# Patient Record
Sex: Male | Born: 1950 | Race: Black or African American | Hispanic: No | Smoking: Former smoker
Health system: Southern US, Community
[De-identification: ages and names within clinical notes are randomized; demographics above are authoritative.]

## PROBLEM LIST (undated history)

## (undated) DIAGNOSIS — N189 Chronic kidney disease, unspecified: Secondary | ICD-10-CM

## (undated) DIAGNOSIS — C801 Malignant (primary) neoplasm, unspecified: Secondary | ICD-10-CM

## (undated) DIAGNOSIS — M542 Cervicalgia: Secondary | ICD-10-CM

## (undated) DIAGNOSIS — R51 Headache: Secondary | ICD-10-CM

## (undated) DIAGNOSIS — Z940 Kidney transplant status: Secondary | ICD-10-CM

## (undated) DIAGNOSIS — K5900 Constipation, unspecified: Secondary | ICD-10-CM

## (undated) DIAGNOSIS — E119 Type 2 diabetes mellitus without complications: Secondary | ICD-10-CM

## (undated) DIAGNOSIS — R519 Headache, unspecified: Secondary | ICD-10-CM

## (undated) DIAGNOSIS — I10 Essential (primary) hypertension: Secondary | ICD-10-CM

## (undated) HISTORY — PX: PROSTATECTOMY: SHX69

## (undated) SURGERY — Surgical Case
Anesthesia: *Unknown

---

## 2003-07-30 ENCOUNTER — Encounter: Admission: RE | Admit: 2003-07-30 | Discharge: 2003-07-30 | Payer: Self-pay | Admitting: Family Medicine

## 2013-11-19 ENCOUNTER — Inpatient Hospital Stay (HOSPITAL_COMMUNITY): Payer: Medicaid Other

## 2013-11-19 ENCOUNTER — Inpatient Hospital Stay (HOSPITAL_COMMUNITY)
Admission: EM | Admit: 2013-11-19 | Discharge: 2013-11-26 | DRG: 674 | Disposition: A | Discharge status: Home / Self Care | Payer: Medicaid Other | Attending: Internal Medicine | Admitting: Internal Medicine

## 2013-11-19 ENCOUNTER — Encounter (HOSPITAL_COMMUNITY): Payer: Self-pay | Admitting: Emergency Medicine

## 2013-11-19 ENCOUNTER — Emergency Department (HOSPITAL_COMMUNITY): Payer: Medicaid Other

## 2013-11-19 DIAGNOSIS — D631 Anemia in chronic kidney disease: Secondary | ICD-10-CM | POA: Diagnosis present

## 2013-11-19 DIAGNOSIS — D649 Anemia, unspecified: Secondary | ICD-10-CM

## 2013-11-19 DIAGNOSIS — Z79899 Other long term (current) drug therapy: Secondary | ICD-10-CM | POA: Diagnosis not present

## 2013-11-19 DIAGNOSIS — D696 Thrombocytopenia, unspecified: Secondary | ICD-10-CM

## 2013-11-19 DIAGNOSIS — N186 End stage renal disease: Secondary | ICD-10-CM | POA: Diagnosis present

## 2013-11-19 DIAGNOSIS — N2581 Secondary hyperparathyroidism of renal origin: Secondary | ICD-10-CM | POA: Diagnosis present

## 2013-11-19 DIAGNOSIS — N19 Unspecified kidney failure: Secondary | ICD-10-CM

## 2013-11-19 DIAGNOSIS — M7989 Other specified soft tissue disorders: Secondary | ICD-10-CM | POA: Diagnosis present

## 2013-11-19 DIAGNOSIS — Z87891 Personal history of nicotine dependence: Secondary | ICD-10-CM | POA: Diagnosis not present

## 2013-11-19 DIAGNOSIS — N179 Acute kidney failure, unspecified: Secondary | ICD-10-CM

## 2013-11-19 DIAGNOSIS — E872 Acidosis, unspecified: Secondary | ICD-10-CM | POA: Diagnosis present

## 2013-11-19 DIAGNOSIS — I120 Hypertensive chronic kidney disease with stage 5 chronic kidney disease or end stage renal disease: Secondary | ICD-10-CM | POA: Diagnosis present

## 2013-11-19 DIAGNOSIS — K5900 Constipation, unspecified: Secondary | ICD-10-CM | POA: Diagnosis not present

## 2013-11-19 DIAGNOSIS — R197 Diarrhea, unspecified: Secondary | ICD-10-CM | POA: Diagnosis present

## 2013-11-19 DIAGNOSIS — N039 Chronic nephritic syndrome with unspecified morphologic changes: Secondary | ICD-10-CM

## 2013-11-19 DIAGNOSIS — N032 Chronic nephritic syndrome with diffuse membranous glomerulonephritis: Secondary | ICD-10-CM | POA: Diagnosis present

## 2013-11-19 DIAGNOSIS — I10 Essential (primary) hypertension: Secondary | ICD-10-CM

## 2013-11-19 HISTORY — DX: Essential (primary) hypertension: I10

## 2013-11-19 LAB — URINALYSIS, ROUTINE W REFLEX MICROSCOPIC
Bilirubin Urine: NEGATIVE
Glucose, UA: NEGATIVE mg/dL
Ketones, ur: NEGATIVE mg/dL
Leukocytes, UA: NEGATIVE
Nitrite: NEGATIVE
Protein, ur: 100 mg/dL — AB
Specific Gravity, Urine: 1.007 (ref 1.005–1.030)
Urobilinogen, UA: 0.2 mg/dL (ref 0.0–1.0)
pH: 6 (ref 5.0–8.0)

## 2013-11-19 LAB — HEPATIC FUNCTION PANEL
ALT: <5 U/L (ref 0–53)
AST: 14 U/L (ref 0–37)
Albumin: 3.5 g/dL (ref 3.5–5.2)
Alkaline Phosphatase: 67 U/L (ref 39–117)
Bilirubin, Direct: <0.2 mg/dL (ref 0.0–0.3)
Total Bilirubin: 0.3 mg/dL (ref 0.3–1.2)
Total Protein: 6.7 g/dL (ref 6.0–8.3)

## 2013-11-19 LAB — PROTEIN / CREATININE RATIO, URINE
Creatinine, Urine: 38.59 mg/dL
Protein Creatinine Ratio: 2.37 — ABNORMAL HIGH (ref 0.00–0.15)
Total Protein, Urine: 91.4 mg/dL

## 2013-11-19 LAB — CBC
HCT: 21.2 % — ABNORMAL LOW (ref 39.0–52.0)
Hemoglobin: 7.2 g/dL — ABNORMAL LOW (ref 13.0–17.0)
MCH: 30.4 pg (ref 26.0–34.0)
MCHC: 34 g/dL (ref 30.0–36.0)
MCV: 89.5 fL (ref 78.0–100.0)
Platelets: 172 10*3/uL (ref 150–400)
RBC: 2.37 MIL/uL — ABNORMAL LOW (ref 4.22–5.81)
RDW: 14.8 % (ref 11.5–15.5)
WBC: 5 10*3/uL (ref 4.0–10.5)

## 2013-11-19 LAB — BASIC METABOLIC PANEL
BUN: 100 mg/dL — ABNORMAL HIGH (ref 6–23)
CO2: 13 mEq/L — ABNORMAL LOW (ref 19–32)
Calcium: 5 mg/dL — CL (ref 8.4–10.5)
Chloride: 100 mEq/L (ref 96–112)
Creatinine, Ser: 9.83 mg/dL — ABNORMAL HIGH (ref 0.50–1.35)
GFR calc Af Amer: 6 mL/min — ABNORMAL LOW (ref >90)
GFR calc non Af Amer: 5 mL/min — ABNORMAL LOW (ref >90)
Glucose, Bld: 87 mg/dL (ref 70–99)
Potassium: 4.1 mEq/L (ref 3.7–5.3)
Sodium: 141 mEq/L (ref 137–147)

## 2013-11-19 LAB — MAGNESIUM: Magnesium: 1.5 mg/dL (ref 1.5–2.5)

## 2013-11-19 LAB — URINE MICROSCOPIC-ADD ON

## 2013-11-19 LAB — PHOSPHORUS: Phosphorus: 10.3 mg/dL — ABNORMAL HIGH (ref 2.3–4.6)

## 2013-11-19 LAB — SODIUM, URINE, RANDOM: Sodium, Ur: 100 mEq/L

## 2013-11-19 LAB — CREATININE, URINE, RANDOM: Creatinine, Urine: 38.59 mg/dL

## 2013-11-19 MED ORDER — SODIUM CHLORIDE 0.9 % IV SOLN: INTRAVENOUS | Status: DC

## 2013-11-19 MED ORDER — SODIUM CHLORIDE 0.9 % IV SOLN: Freq: Once | INTRAVENOUS | Status: DC

## 2013-11-19 MED ORDER — SODIUM CHLORIDE 0.9 % IV SOLN
1.0000 g | Freq: Once | INTRAVENOUS | Status: AC
  Administered 2013-11-19: 1 g via INTRAVENOUS
  Filled 2013-11-19: qty 10

## 2013-11-19 MED ORDER — PROMETHAZINE HCL 25 MG/ML IJ SOLN
12.5000 mg | INTRAMUSCULAR | Status: DC | PRN
  Administered 2013-11-22: 25 mg via INTRAVENOUS
  Filled 2013-11-19: qty 1

## 2013-11-19 MED ORDER — AMLODIPINE BESYLATE 10 MG PO TABS
10.0000 mg | ORAL_TABLET | Freq: Every day | ORAL | Status: DC
  Administered 2013-11-20 – 2013-11-26 (×5): 10 mg via ORAL
  Filled 2013-11-19 (×8): qty 1

## 2013-11-19 MED ORDER — ACETAMINOPHEN 500 MG PO TABS
1000.0000 mg | ORAL_TABLET | Freq: Four times a day (QID) | ORAL | Status: DC | PRN
  Administered 2013-11-19 – 2013-11-24 (×3): 1000 mg via ORAL
  Filled 2013-11-19 (×3): qty 2

## 2013-11-19 MED ORDER — SODIUM CHLORIDE 0.9 % IV SOLN
2.0000 g | Freq: Once | INTRAVENOUS | Status: AC
  Administered 2013-11-19: 2 g via INTRAVENOUS
  Filled 2013-11-19: qty 20

## 2013-11-19 MED ORDER — SODIUM BICARBONATE 650 MG PO TABS
1300.0000 mg | ORAL_TABLET | Freq: Three times a day (TID) | ORAL | Status: DC
  Administered 2013-11-19 – 2013-11-22 (×9): 1300 mg via ORAL
  Filled 2013-11-19 (×13): qty 2

## 2013-11-19 MED ORDER — CALCITRIOL 0.5 MCG PO CAPS
0.5000 ug | ORAL_CAPSULE | Freq: Every day | ORAL | Status: DC
  Administered 2013-11-19 – 2013-11-26 (×7): 0.5 ug via ORAL
  Filled 2013-11-19 (×8): qty 1

## 2013-11-19 MED ORDER — MAGNESIUM SULFATE 40 MG/ML IJ SOLN
2.0000 g | Freq: Once | INTRAMUSCULAR | Status: AC
  Administered 2013-11-19: 2 g via INTRAVENOUS
  Filled 2013-11-19: qty 50

## 2013-11-19 MED ORDER — HEPARIN SODIUM (PORCINE) 5000 UNIT/ML IJ SOLN
5000.0000 [IU] | Freq: Three times a day (TID) | INTRAMUSCULAR | Status: DC
  Administered 2013-11-19 – 2013-11-26 (×18): 5000 [IU] via SUBCUTANEOUS
  Filled 2013-11-19 (×23): qty 1

## 2013-11-19 MED ORDER — CALCIUM CARBONATE ANTACID 500 MG PO CHEW
800.0000 mg | CHEWABLE_TABLET | Freq: Three times a day (TID) | ORAL | Status: DC
  Administered 2013-11-20 – 2013-11-26 (×17): 800 mg via ORAL
  Filled 2013-11-19 (×22): qty 4

## 2013-11-19 MED ORDER — AMLODIPINE BESYLATE 5 MG PO TABS
5.0000 mg | ORAL_TABLET | Freq: Every day | ORAL | Status: DC
  Filled 2013-11-19: qty 1

## 2013-11-19 NOTE — H&P (Signed)
Date: 11/19/2013               Patient Name:  Andrew Brandt MRN: [id number]  DOB: 07/01/1951 Age / Sex: 63 y.o., male   PCP: No primary provider on file.         Medical Service: Internal Medicine Teaching Service         Attending Physician: Dr. Bartholomew Crews, MD    First Contact: Dr. Naaman Plummer Pager: [phone]  Second Contact: Dr. Aundra Dubin Pager: [phone]       After Hours (After 5p/  First Contact Pager: [phone]  weekends / holidays): Second Contact Pager: [phone]   Chief Complaint: Abnormal Lab results,and dialysis.  History of Present Illness: 63 year old male with PMH of HTN, who went to his girlfriends PCP Dr. Vista Lawman, for just a routine check yesterday. Lab results came back and he was told to go to the Ed has he had abnormal results and was told he needs dialysis. Pt doesnt have a PCP, has not seen a doctor in many years but was told in the past that he has HTN. Yesterday Amlodipine- Benazepril was prescribed for pt, which he just started taking. Pt had some nausea but denies vomiting, but says he has 2 episodes of loose stools over the two days- Non bloody. Normal appetite been eating and drinking. Denies use of NSAIDs. The only medications he takes is Tylenol. No difficulty passing urine or dysuric symptoms, no abdominal or flank pain. No family hx of kidney disease. Pt does say that he has had some leg swelling over the past 2 weeks, but no shortness of breath.  Meds: Current Facility-Administered Medications  Medication Dose Route Frequency Provider Last Rate Last Dose  . calcium gluconate 2 g in sodium chloride 0.9 % 100 mL IVPB  2 g Intravenous Once Cresenciano Genre, MD 120 mL/hr at 11/19/13 1429 2 g at 11/19/13 1429   Current Outpatient Prescriptions  Medication Sig Dispense Refill  . acetaminophen (TYLENOL) 500 MG tablet Take 1,000 mg by mouth every 6 (six) hours as needed for mild pain.      Marland Kitchen amLODipine-benazepril (LOTREL) 5-20 MG per capsule Take 1 capsule by  mouth daily.        Allergies: Allergies as of 11/19/2013  . (No Known Allergies)   Past Medical History  Diagnosis Date  . Hypertension    History reviewed. No pertinent past surgical history. No family history on file. History   Social History  . Marital Status: Single    Spouse Name: N/A    Number of Children: N/A  . Years of Education: N/A   Occupational History  . Not on file.   Social History Main Topics  . Smoking status: Former Research scientist (life sciences)  . Smokeless tobacco: Not on file  . Alcohol Use: No  . Drug Use: Not on file  . Sexual Activity: Not on file   Other Topics Concern  . Not on file   Social History Narrative  . No narrative on file    Review of Systems: CONSTITUTIONAL- No Fever, No weightloss or gain , night sweat, or change in appetite. SKIN- No Rash, colour changes, itching. HEAD- No  Headache,or dizziness. Mouth/throat- No Sorethroat, dentures, or bleeding gums. RESPIRATORY- No Cough, or SOB. CARDIAC- No Palpitations, or  chest pain. GI- No Dysphagia or abd pain. URINARY- No Frequency, or dysuria. NEUROLOGIC- No Numbness, syncope, or burning.   Physical Exam: Blood pressure 150/75, pulse 86, temperature 98.1 F (36.7 C),  temperature source Oral, resp. rate 17, SpO2 99.00%. GENERAL- Lying in bed comfortably, alert, co-operative, appears as stated age, not in any distress, but appears chronically ill.Marland Kitchen HEENT- Atraumatic, normocephalic, PERRL, EOMI, oral mucosa appears moist, no cervical LN enlargement, thyroid does not appear enlarged. CARDIAC- RRR, no murmurs, rubs or gallops. RESP- Moving equal volumes of air, no wheezes no crackles. ABDOMEN- Soft, nontender, no palpable masses or organomegaly, bowel sounds present. BACK- Normal curvature of the spine, No tenderness along the vertebrae, no CVA tenderness. NEURO- No obvious Cr N abnormality, strenght equal and present in all extremities, EXTREMITIES- Dorsalis pedis pulse 2+, symmetric, +1 Pitting  pedal edema to the lower third of his leg. SKIN- Warm, dry, No rash or lesion. PSYCH- Normal mood and affect, appropriate thought content and speech.  Lab results: Basic Metabolic Panel:  Recent Labs  11/19/13 1018  NA 141  K 4.1  CL 100  CO2 13*  GLUCOSE 87  BUN 100*  CREATININE 9.83*  CALCIUM 5.0*  MG 1.5   Liver Function Tests: No results found for this basename: AST, ALT, ALKPHOS, BILITOT, PROT, ALBUMIN,  in the last 72 hours No results found for this basename: LIPASE, AMYLASE,  in the last 72 hours No results found for this basename: AMMONIA,  in the last 72 hours CBC:  Recent Labs  11/19/13 1018  WBC 5.0  HGB 7.2*  HCT 21.2*  MCV 89.5  PLT 172    Urinalysis: No results found for this basename: COLORURINE, APPERANCEUR, LABSPEC, PHURINE, GLUCOSEU, HGBUR, BILIRUBINUR, KETONESUR, PROTEINUR, UROBILINOGEN, NITRITE, LEUKOCYTESUR,  in the last 72 hours  Imaging results:  Dg Chest Portable 1 View  11/19/2013   CLINICAL DATA:  History of hypertension, former smoker, need dialysis  EXAM: PORTABLE CHEST - 1 VIEW  COMPARISON:  None.  FINDINGS: The heart size and mediastinal contours are within normal limits. Both lungs are clear. The visualized skeletal structures are unremarkable.  IMPRESSION: No active disease.   Electronically Signed   By: Skipper Cliche M.D.   On: 11/19/2013 10:36    Other results: EKG: Rate- 89 bpm, regular, normal sinus rhythm, no ST or T wave abnormalities, normal PR and QRS interval, Normal axis, Qtc- 490.   Assessment & Plan by Problem: Active Problems:   AKI (acute kidney injury)  Acute Kidney Injury- Baseline kidney function is unknown. Pt most likley has underlying chronic kidney disease. No records to compare, as pt has not seen a doctor in many years. Cr on admission- 9.83, BUN- 100, Bicarb- 13, Calculated Anion Gap- 28.  Acute kidney injury- at this time no identifiable etiology- Denies NSAID use, no significant hx of fluid loss to suggest  prerenal etiology. No hx suggestive of Obstructive uropathy, and therefore post renal AKI. Pt has a hx of HTN- the most likley cause of his ?chronc kidney disease. Pts is  Hypocalcemic and hyperphosphatemic -  Calcium- 5.5, Phosphorus- 10.3, are suggestive of an underlying chronic kidney disease. Pt got a total of 3g of Calcium gluconate in the Ed and 4g of mag. - Admit to tele - Renal Uss - Urinalysis - Hepatic function Panel - SPEP, UPEP.  - Urine Na and osmolality - Nephrology consulted, Recs appreciated. - Urine Osmolality - HgbA1c - Magnesium - Renal Diet - Daily Weights - CBC and Bmet in the Am. - Stool for C. Diff. - GI pathogen panel   HTN- Blood pressure elevated in the Ed- 1166/83. New blood pressure meds started yesterday- Amlodipine- Benazepril- 5-20, 1 capsule  daily. - Amlodipine 5mg  daily, increase to 10mg  as indicated. - Avoid ACE inh.  DVT PPX- SCDs and heparin 5000u TID.  Code Status- Full.  Dispo: Disposition is deferred at this time, awaiting improvement of current medical problems.   The patient does not have a current PCP (No primary provider on file.) and does need an [facility] hospital follow-up appointment after discharge.  The patient does not have transportation limitations that hinder transportation to clinic appointments.  Signed: Jenetta Downer, MD 11/19/2013, 2:59 PM

## 2013-11-19 NOTE — ED Notes (Signed)
Communication with Phlebotomy for lab

## 2013-11-19 NOTE — ED Notes (Signed)
Critical Lab value communicated with MD

## 2013-11-19 NOTE — Discharge Planning (Signed)
[place] to patient about primary care resources and establishing care with a provider. Patient states he is uninsured and hasn't been seen by a primary care physician prior to yesterday in a few years. Patient was seen by Dr.Bonsu yesterday and was informed to come to the ED for further evaluation. States that he plans to continue care with Dr.Bonsu at [facility], and does not wish to change practices. Follow up appointment with PCP  Thursday May 7,2015 at 4:00, patient aware of this appointment. Medicaid application was given and explained, pt was instructed to take the completed application to the Department of Social Services. A resource guide and my contact information was also provided for any future questions or concerns. Patient expressed no other needs at this time.

## 2013-11-19 NOTE — ED Notes (Signed)
Called the floor to ask if bed was cleaned. Bed is not cleaned. Soil scientist

## 2013-11-19 NOTE — ED Notes (Signed)
Meal tray ordered for pt  

## 2013-11-19 NOTE — ED Notes (Signed)
Per Jonni Sanger RN on 6E Will call when bed is cleaned and in the room.

## 2013-11-19 NOTE — ED Notes (Signed)
63 yo male reports going to the Dr. Fatima Sanger office to have blood work. The nurse from the office called the pt. This morning stating that he needed to report to the ER for Dialysis. No known med hx on file.

## 2013-11-19 NOTE — ED Provider Notes (Signed)
CSN: [id number]     Arrival date & time 11/19/13  0930 History   First MD Initiated Contact with Patient 11/19/13 [phone]     Chief Complaint  Patient presents with  . Dialysis   . Electrolyte Imbalance      (Consider location/radiation/quality/duration/timing/severity/associated sxs/prior Treatment) HPI Comments: Patient reports he had about an hour of nausea last week. Otherwise he's been feeling his usual self. He reports that he saw Dr. Vista Lawman, his girlfriend primary care physician for just a routine check yesterday. He admits that he does not have a regular primary care physician, has not seen one in many years. He was told along time ago that he had hypertension but has not been on in medications for a long time. He is a former smoker, does not drink. Often, denies any drug use. He reports he's had 2-3 loose stools that he calls diarrhea over the last 2 days.  The history is provided by the patient.    Past Medical History  Diagnosis Date  . Hypertension    History reviewed. No pertinent past surgical history. No family history on file. History  Substance Use Topics  . Smoking status: Former Research scientist (life sciences)  . Smokeless tobacco: Not on file  . Alcohol Use: No    Review of Systems  Constitutional: Negative for fever, chills, appetite change, fatigue and unexpected weight change.  Cardiovascular: Negative for chest pain.  Gastrointestinal: Positive for nausea and diarrhea. Negative for vomiting and abdominal pain.  Neurological: Negative for dizziness and syncope.  All other systems reviewed and are negative.     Allergies  Review of patient's allergies indicates no known allergies.  Home Medications   Prior to Admission medications   Not on File   BP 134/58  Pulse 83  Temp(Src) 98.1 F (36.7 C) (Oral)  Resp 10  SpO2 100% Physical Exam  Nursing note and vitals reviewed. Constitutional: He is oriented to person, place, and time. He appears well-developed and  well-nourished. No distress.  HENT:  Head: Normocephalic and atraumatic.  Eyes: Conjunctivae are normal. No scleral icterus.  Neck: Normal range of motion. Neck supple.  Cardiovascular: Normal rate, regular rhythm and intact distal pulses.   Pulmonary/Chest: Effort normal. He has no wheezes. He has no rales.  Abdominal: Soft. He exhibits no distension. There is no tenderness.  Neurological: He is alert and oriented to person, place, and time. Coordination normal.  Skin: Skin is warm. No rash noted. He is not diaphoretic.    ED Course  Procedures (including critical care time) Labs Review Labs Reviewed  BASIC METABOLIC PANEL - Abnormal; Notable for the following:    CO2 13 (*)    BUN 100 (*)    Creatinine, Ser 9.83 (*)    Calcium 5.0 (*)    GFR calc non Af Amer 5 (*)    GFR calc Af Amer 6 (*)    All other components within normal limits  CBC - Abnormal; Notable for the following:    RBC 2.37 (*)    Hemoglobin 7.2 (*)    HCT 21.2 (*)    All other components within normal limits  MAGNESIUM    Imaging Review Dg Chest Portable 1 View  11/19/2013   CLINICAL DATA:  History of hypertension, former smoker, need dialysis  EXAM: PORTABLE CHEST - 1 VIEW  COMPARISON:  None.  FINDINGS: The heart size and mediastinal contours are within normal limits. Both lungs are clear. The visualized skeletal structures are unremarkable.  IMPRESSION: No  active disease.   Electronically Signed   By: Skipper Cliche M.D.   On: 11/19/2013 10:36     EKG Interpretation At time 09 56, sinus rhythm at a rate of 89, normal axis, borderline prolonged QT interval, nonspecific intraventricular conduction delay. No prior EKGs are available. Borderline EKG.     Room air saturation is 100% and I interpret this to be normal   1:20 PM Spoke to [facility] who will see pt and admit MDM   Final diagnoses:  Renal failure  Hypocalcemia  Anemia     Patient with prior history of hypertension, no regular preventative  care otherwise. Patient sounds like he was sent here due to concerns related to needing dialysis. Will recheck his electrolytes, obtain chest x-ray and EKG. My concern would be for renal failure and hyperkalemia. Plan on keeping the patient on monitor. His EKG suggests some borderline QT prolongation. Patient may require admission given presumed abnormal labs that were drawn yesterday at a primary care office.    Saddie Benders. Tyce Delcid, MD 11/19/13 1320

## 2013-11-19 NOTE — Consult Note (Signed)
Andrew Brandt 11/19/2013 Rexene Agent Requesting Physician:  Lynnae January, MD  Reason for Consult:  Azotemia, Renal Failure, Hypocalcemia HPI:  57M who for years has not had regular medical care saw his girlfriend's PMD yesterday for some N/V and loose stools.  Labs returned with severe renal failure, hypocalcemia as below.  Pt does not take NSAIDs, no recent ABX or infections of skin, sore throat, no ACEi. No OTC remedies.  No unusual rashes/joint pains.  No hemoptysis, sores in mouth / nose, hematuria, dysuria, frothy urine.  He has some pedal edema but no further swelling.    Pt w/ some intermittent poor PO over past 7d.  No itching, hiccups, dysgeusia, CP, SOB.  He thinks his girlfriend things he has been 'confused'.  NO problems with amount of urine or urine function.    In 2005 had renal US with b/l kidneys < 9cm.  He does carry a hx/o HTN.  He denies cocaine or other recreational drug use.    Creatinine, Ser (mg/dL)  Date Value  11/19/2013 9.83*  ] I/Os: I/O last 3 completed shifts: In: 100 [I.V.:100] Out: 500 [Urine:500]   ROS Balance of 12 systems is negative w/ exceptions as above  PMH  Past Medical History  Diagnosis Date  . Hypertension    PSH History reviewed. No pertinent past surgical history. FH  Family History  Problem Relation Age of Onset  . Cancer Mother   . Diabetes      grandparents   [name]  reports that he has quit smoking. He does not have any smokeless tobacco history on file. He reports that he does not drink alcohol. His drug history is not on file. Allergies No Known Allergies Home medications Prior to Admission medications   Medication Sig Start Date End Date Taking? Authorizing Provider  acetaminophen (TYLENOL) 500 MG tablet Take 1,000 mg by mouth every 6 (six) hours as needed for mild pain.   Yes Historical Provider, MD  amLODipine-benazepril (LOTREL) 5-20 MG per capsule Take 1 capsule by mouth daily.   Yes Historical Provider, MD     Current Medications Current Facility-Administered Medications  Medication Dose Route Frequency Provider Last Rate Last Dose  . 0.9 %  sodium chloride infusion   Intravenous Once Saddie Benders. Ghim, MD      . 0.9 %  sodium chloride infusion   Intravenous Continuous Cresenciano Genre, MD      . acetaminophen (TYLENOL) tablet 1,000 mg  1,000 mg Oral Q6H PRN Cresenciano Genre, MD      . amLODipine (NORVASC) tablet 10 mg  10 mg Oral Daily Jessee Avers, MD      . heparin injection 5,000 Units  5,000 Units Subcutaneous 3 times per day Cresenciano Genre, MD      . promethazine (PHENERGAN) injection 12.5-25 mg  12.5-25 mg Intravenous Q4H PRN Cresenciano Genre, MD        CBC  Recent Labs Lab 11/19/13 1018  WBC 5.0  HGB 7.2*  HCT 21.2*  MCV 89.5  PLT [id number]   Basic Metabolic Panel  Recent Labs Lab 11/19/13 1018 11/19/13 1510  NA 141  --   K 4.1  --   CL 100  --   CO2 13*  --   GLUCOSE 87  --   BUN 100*  --   CREATININE 9.83*  --   CALCIUM 5.0*  --   PHOS  --  10.3*    Physical Exam  Blood pressure 183/87, pulse  88, temperature 98.7 F (37.1 C), temperature source Oral, resp. rate 15, weight 68.1 kg (150 lb 2.1 oz), SpO2 100.00%. GEN: NAD, appears well ENT: NCAT,  Poor dentition EYES: EOMI CV: RRR.  No rub. Nl s1s2 PULM: CTAB.  Nl WOB ABD: s/nt/nd nabs.  No bruits SKIN: no rashes/lesions [id number] pedal edema b/l NEURO: no asterixis, aaox3 MSK: no twitching noted   Assessment 68M presenting with renal failure, normocytic anemia, hyperphosphatemia, hypocalcemia and a history of small kidneys from 2005 with hx/o HTN.  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    1. Renal Failure, favor CKD over AKI and likely ESRD 2. Normocytic Anemia 3. Hyperphosphatemia 4. Hypocalcemia, ASx largely 5. HTN 6. Acidosis, metabolic, inc  AG likely 2/2 renal failure  PLAN 1. Agree with gently hydrating overnight but doubt pt is hypovolemic 2. Renal US to verify no obstruction, obtain new kidney sizes 3. If not improved in AM need to c/s VVS for [facility] + permanent Access and move towards HD 4. Check iron indices, B12.  If replete will need ESA 5. SPEP, sFLC, HIV, HCV, HBV, C3, C4 6. UA and UPC 7. Agree with PTH 8. Add Tums TID 9. Begin calcitriol 0.69mcg daily 10. Agree with LFTs/albumin 11. Hold ACEi, cont CCB. 12. Start NaHCO3 1300 TID 13. NPO p MN for possible catheter 14. Likely pt will need RRT in next 24h 15. No NSAIDS  Pearson Grippe MD 11/19/2013, 8:08 PM

## 2013-11-19 NOTE — ED Notes (Signed)
Reports previously being nauseous but not currently. Diet and appetitie normal.

## 2013-11-19 NOTE — ED Notes (Signed)
MD at bedside. 

## 2013-11-19 NOTE — ED Notes (Signed)
Communication received from [facility]. Regarding bed assignment

## 2013-11-20 ENCOUNTER — Inpatient Hospital Stay (HOSPITAL_COMMUNITY): Payer: Medicaid Other

## 2013-11-20 ENCOUNTER — Encounter (HOSPITAL_COMMUNITY): Payer: Self-pay

## 2013-11-20 DIAGNOSIS — N185 Chronic kidney disease, stage 5: Secondary | ICD-10-CM

## 2013-11-20 DIAGNOSIS — E872 Acidosis, unspecified: Secondary | ICD-10-CM

## 2013-11-20 DIAGNOSIS — D649 Anemia, unspecified: Secondary | ICD-10-CM | POA: Diagnosis present

## 2013-11-20 DIAGNOSIS — N186 End stage renal disease: Secondary | ICD-10-CM | POA: Diagnosis present

## 2013-11-20 DIAGNOSIS — R197 Diarrhea, unspecified: Secondary | ICD-10-CM

## 2013-11-20 LAB — HIV ANTIBODY (ROUTINE TESTING W REFLEX): HIV 1&2 Ab, 4th Generation: NONREACTIVE

## 2013-11-20 LAB — CBC WITH DIFFERENTIAL/PLATELET
Basophils Absolute: 0 10*3/uL (ref 0.0–0.1)
Basophils Relative: 0 % (ref 0–1)
Eosinophils Absolute: 0.1 10*3/uL (ref 0.0–0.7)
Eosinophils Relative: 3 % (ref 0–5)
HCT: 18.1 % — ABNORMAL LOW (ref 39.0–52.0)
Hemoglobin: 6.1 g/dL — CL (ref 13.0–17.0)
Lymphocytes Relative: 34 % (ref 12–46)
Lymphs Abs: 1.1 10*3/uL (ref 0.7–4.0)
MCH: 29.6 pg (ref 26.0–34.0)
MCHC: 33.7 g/dL (ref 30.0–36.0)
MCV: 87.9 fL (ref 78.0–100.0)
Monocytes Absolute: 0.3 10*3/uL (ref 0.1–1.0)
Monocytes Relative: 9 % (ref 3–12)
Neutro Abs: 1.6 10*3/uL — ABNORMAL LOW (ref 1.7–7.7)
Neutrophils Relative %: 54 % (ref 43–77)
Platelets: 151 10*3/uL (ref 150–400)
RBC: 2.06 MIL/uL — ABNORMAL LOW (ref 4.22–5.81)
RDW: 14.5 % (ref 11.5–15.5)
WBC: 3.1 10*3/uL — ABNORMAL LOW (ref 4.0–10.5)

## 2013-11-20 LAB — FERRITIN: Ferritin: 246 ng/mL (ref 22–322)

## 2013-11-20 LAB — ABO/RH: ABO/RH(D): O POS

## 2013-11-20 LAB — IRON AND TIBC
Iron: 53 ug/dL (ref 42–135)
Saturation Ratios: 31 % (ref 20–55)
TIBC: 172 ug/dL — ABNORMAL LOW (ref 215–435)
UIBC: 119 ug/dL — ABNORMAL LOW (ref 125–400)

## 2013-11-20 LAB — CBC
HCT: 27.5 % — ABNORMAL LOW (ref 39.0–52.0)
Hemoglobin: 9.4 g/dL — ABNORMAL LOW (ref 13.0–17.0)
MCH: 29.7 pg (ref 26.0–34.0)
MCHC: 34.2 g/dL (ref 30.0–36.0)
MCV: 86.8 fL (ref 78.0–100.0)
Platelets: 123 10*3/uL — ABNORMAL LOW (ref 150–400)
RBC: 3.17 MIL/uL — ABNORMAL LOW (ref 4.22–5.81)
RDW: 14.4 % (ref 11.5–15.5)
WBC: 5.7 10*3/uL (ref 4.0–10.5)

## 2013-11-20 LAB — CALCIUM, IONIZED: Calcium, Ion: 0.61 mmol/L — CL (ref 1.13–1.30)

## 2013-11-20 LAB — OSMOLALITY, URINE: Osmolality, Ur: 291 mOsm/kg — ABNORMAL LOW (ref 390–1090)

## 2013-11-20 LAB — HEMOGLOBIN A1C
Hgb A1c MFr Bld: 5.2 % (ref <5.7)
Mean Plasma Glucose: 103 mg/dL (ref <117)

## 2013-11-20 LAB — BASIC METABOLIC PANEL
BUN: 100 mg/dL — ABNORMAL HIGH (ref 6–23)
CO2: 15 mEq/L — ABNORMAL LOW (ref 19–32)
Calcium: 5.1 mg/dL — CL (ref 8.4–10.5)
Chloride: 104 mEq/L (ref 96–112)
Creatinine, Ser: 9.78 mg/dL — ABNORMAL HIGH (ref 0.50–1.35)
GFR calc Af Amer: 6 mL/min — ABNORMAL LOW (ref >90)
GFR calc non Af Amer: 5 mL/min — ABNORMAL LOW (ref >90)
Glucose, Bld: 85 mg/dL (ref 70–99)
Potassium: 4.5 mEq/L (ref 3.7–5.3)
Sodium: 141 mEq/L (ref 137–147)

## 2013-11-20 LAB — PTH, INTACT AND CALCIUM
Calcium, Total (PTH): 5.8 mg/dL — CL (ref 8.4–10.5)
PTH: 925.4 pg/mL — ABNORMAL HIGH (ref 14.0–72.0)

## 2013-11-20 LAB — PREPARE RBC (CROSSMATCH)

## 2013-11-20 LAB — RETICULOCYTES
RBC.: 2.06 MIL/uL — ABNORMAL LOW (ref 4.22–5.81)
Retic Count, Absolute: 14.4 10*3/uL — ABNORMAL LOW (ref 19.0–186.0)
Retic Ct Pct: 0.7 % (ref 0.4–3.1)

## 2013-11-20 LAB — HEPATITIS B SURFACE ANTIGEN: Hepatitis B Surface Ag: NEGATIVE

## 2013-11-20 LAB — HEPATITIS B CORE ANTIBODY, TOTAL: Hep B Core Total Ab: NONREACTIVE

## 2013-11-20 LAB — CALCIUM, URINE, RANDOM: Calcium, Ur: 2 mg/dL

## 2013-11-20 LAB — VITAMIN D 25 HYDROXY (VIT D DEFICIENCY, FRACTURES): Vit D, 25-Hydroxy: 16 ng/mL — ABNORMAL LOW (ref 30–89)

## 2013-11-20 LAB — VITAMIN B12: Vitamin B-12: 332 pg/mL (ref 211–911)

## 2013-11-20 LAB — MAGNESIUM: Magnesium: 1.8 mg/dL (ref 1.5–2.5)

## 2013-11-20 LAB — HEPATITIS C ANTIBODY: HCV Ab: NEGATIVE

## 2013-11-20 LAB — HEPATITIS B SURFACE ANTIBODY,QUALITATIVE: Hep B S Ab: NEGATIVE

## 2013-11-20 MED ORDER — DEXTROSE 5 % IV SOLN: 1.5000 g | INTRAVENOUS | Status: DC

## 2013-11-20 NOTE — Procedures (Signed)
Placement of right IJ dialysis catheter   Emergent dialysis treatment  Lidocaine local anesthetic Localization of right IJ with ultrasound  Seldinger technique employed Flashback venous blood 3-0 nylon sutures placed x2 CXR ordered to confirm placement  Sherril Croon 11/20/2013, 11:12 AM

## 2013-11-20 NOTE — Procedures (Signed)
I have seen and examined this patient and agree with the plan of care. New start to dialysis . Tolerated placement of 15cm Right IJ dialysis catheter. Sherril Croon 11/20/2013, 11:11 AM

## 2013-11-20 NOTE — Progress Notes (Addendum)
CRITICAL VALUE ALERT  Critical value received: ionized Calcium 0.61.   Date of notification:  11/20/13  Time of notification:  205  Critical value read back:yes  Nurse who received alert:  Velora Mediate  MD notified (1st page):  Internal medicine

## 2013-11-20 NOTE — Progress Notes (Signed)
Utilization review completed.  

## 2013-11-20 NOTE — H&P (Signed)
  Date: 11/20/2013  Patient name: Andrew Brandt  Medical record number: [id number]  Date of birth: [birth year]   I have seen and evaluated Rollen Sox and discussed their care with the Residency Team. Mr Coral Spikes was admitted with renal failure, presumed chronic. He was hydrated overnight even though he was not sig vol contracted. His renal U/S R/O obstructive pathology. He is on no NSAID's, ACEI, or other nephrotoxic drugs. He is in HD now. Permanent HD access is being planned.   He has no complaints today. His sig other is in the room. BC he took anti-diarrheal, he has had no further loose stools. He denies increased recent fatigue.   HRRR. LCATB no crackles.   Assessment and Plan: I have seen and evaluated the patient as outlined above. I agree with the formulated Assessment and Plan as detailed in the residents' admission note, with the following changes:   1. Renal failure, presumed chronic - nephrology has started HD and arranging for permanent access. Bicarb has been started for the acidosis. W/U for other causes has been started. Electrolytes are being followed and repleted.   2. HTN - follow on Norvasc.   3. Anemia - to get one unit with HD today as HgB 6.1. % sat OK at 31. Anemia likely 2/2 chronic disease / CRF.  Bartholomew Crews, MD 5/1/20152:39 PM

## 2013-11-20 NOTE — Consult Note (Addendum)
[facility] [place] [facility] HISTORY AND PHYSICAL   History of Present Illness:  Patient is a 63 y.o. year old male who presents for placement of a permanent hemodialysis access. The patient is right handed.  The patient is currently on hemodialysis via a right neck temporary catheter.  The cause of renal failure is thought to be secondary to multifactorial but probably hypertension    Past Medical History  Diagnosis Date  . Hypertension     History reviewed. No pertinent past surgical history.   Social History History  Substance Use Topics  . Smoking status: Former Research scientist (life sciences)  . Smokeless tobacco: Not on file  . Alcohol Use: No    Family History Family History  Problem Relation Age of Onset  . Cancer Mother   . Diabetes      grandparents    Allergies  No Known Allergies   Current Facility-Administered Medications  Medication Dose Route Frequency Provider Last Rate Last Dose  . acetaminophen (TYLENOL) tablet 1,000 mg  1,000 mg Oral Q6H PRN Cresenciano Genre, MD   1,000 mg at 11/19/13 2138  . amLODipine (NORVASC) tablet 10 mg  10 mg Oral Daily Jessee Avers, MD      . calcitRIOL (ROCALTROL) capsule 0.5 mcg  0.5 mcg Oral Daily Rexene Agent, MD   0.5 mcg at 11/19/13 2138  . calcium carbonate (TUMS - dosed in mg elemental calcium) chewable tablet 800 mg of elemental calcium  800 mg of elemental calcium Oral TID WC Rexene Agent, MD   800 mg of elemental calcium at 11/20/13 0820  . heparin injection 5,000 Units  5,000 Units Subcutaneous 3 times per day Cresenciano Genre, MD   5,000 Units at 11/20/13 0531  . promethazine (PHENERGAN) injection 12.5-25 mg  12.5-25 mg Intravenous Q4H PRN Cresenciano Genre, MD      . sodium bicarbonate tablet 1,300 mg  1,300 mg Oral TID Rexene Agent, MD   1,300 mg at 11/19/13 2138    ROS:   General:  No weight loss, Fever, chills  HEENT: No recent headaches, no nasal bleeding, no visual changes, no sore throat  Neurologic: No  dizziness, blackouts, seizures. No recent symptoms of stroke or mini- stroke. No recent episodes of slurred speech, or temporary blindness.  Cardiac: No recent episodes of chest pain/pressure, no shortness of breath at rest.  No shortness of breath with exertion.  Denies history of atrial fibrillation or irregular heartbeat  Vascular: No history of rest pain in feet.  No history of claudication.  No history of non-healing ulcer, No history of DVT   Pulmonary: No home oxygen, no productive cough, no hemoptysis,  No asthma or wheezing  Musculoskeletal:  [ ]  Arthritis, [ ]  Low back pain,  [ ]  Joint pain  Hematologic:No history of hypercoagulable state.  No history of easy bleeding.  No history of anemia  Gastrointestinal: No hematochezia or melena,  No gastroesophageal reflux, no trouble swallowing  Urinary: [x ] chronic Kidney disease, [x ] on HD - [ ]  MWF or [ ]  TTHS, [ ]  Burning with urination, [ ]  Frequent urination, [ ]  Difficulty urinating;   Skin: No rashes  Psychological: No history of anxiety,  No history of depression   Physical Examination  Filed Vitals:   11/19/13 2221 11/20/13 0610 11/20/13 1118 11/20/13 1128  BP: 159/85 169/82 186/105 143/78  Pulse: 80 86 95 79  Temp: 98.8 F (37.1 C) 98.6 F (37 C) 99.1 F (37.3  C)   TempSrc: Oral Oral Oral   Resp: 16 17 11 12   Weight: 157 lb 6.5 oz (71.4 kg)  153 lb 7 oz (69.6 kg)   SpO2: 100% 99% 98%     There is no height on file to calculate BMI.  General:  Alert and oriented, no acute distress HEENT: Normal Neck: right neck temporary catheter Pulmonary: Clear to auscultation bilaterally Cardiac: Regular Rate and Rhythm  Gastrointestinal: Soft, non-tender, non-distended, no mass Skin: No rash Extremity Pulses:  2+ radial, brachial pulses bilaterally, visible left cephalic forearm vein Musculoskeletal: No deformity or edema  Neurologic: Upper and lower extremity motor 5/5 and symmetric  DATA: vein map pending  BMET     Component Value Date/Time   NA 141 11/20/2013 0755   K 4.5 11/20/2013 0755   CL 104 11/20/2013 0755   CO2 15* 11/20/2013 0755   GLUCOSE 85 11/20/2013 0755   BUN 100* 11/20/2013 0755   CREATININE 9.78* 11/20/2013 0755   CALCIUM 5.1* 11/20/2013 0755   GFRNONAA 5* 11/20/2013 0755   GFRAA 6* 11/20/2013 0755    CBC    Component Value Date/Time   WBC 3.1* 11/20/2013 0755   RBC 2.06* 11/20/2013 0755   RBC 2.06* 11/20/2013 0755   HGB 6.1* 11/20/2013 0755   HCT 18.1* 11/20/2013 0755   PLT 151 11/20/2013 0755   MCV 87.9 11/20/2013 0755   MCH 29.6 11/20/2013 0755   MCHC 33.7 11/20/2013 0755   RDW 14.5 11/20/2013 0755   LYMPHSABS 1.1 11/20/2013 0755   MONOABS 0.3 11/20/2013 0755   EOSABS 0.1 11/20/2013 0755   BASOSABS 0.0 11/20/2013 0755       ASSESSMENT: End stage renal disease needs permanent access.  Profound anemia will leave transfusion indications to primary service.   PLAN:  Vein mapping.  Diatek and left arm access on Monday.  [name], MD [facility] [place] Office: [phone] Pager: [phone]

## 2013-11-20 NOTE — Care Management Note (Signed)
CARE MANAGEMENT NOTE 11/20/2013  Patient:  Andrew Brandt, Andrew Brandt   Account Number:  [id number]  Date Initiated:  11/20/2013  Documentation initiated by:  Lirio Bach  Subjective/Objective Assessment:   Referral to assist pt with Medicaid/Medicare application.     Action/Plan:   CM following for progression and treatment determination if pt is determined to be Chronic rneal failure and requires outpatient HD, then hospital will assist with Medicaid application and possiblly disability.   Anticipated DC Date:     Anticipated DC Plan:  HOME/SELF CARE         Choice offered to / List presented to:             Status of service:   Medicare Important Message given?   (If response is "NO", the following Medicare IM given date fields will be blank) Date Medicare IM given:   Date Additional Medicare IM given:    Discharge Disposition:    Per UR Regulation:    If discussed at Long Length of Stay Meetings, dates discussed:    Comments:  11/20/2013 CM following for determination of chronic disease, at time of admission no chronic or long term disease that would qualify for Medicaid has been established. Please be aware that pt will have to be deemed to have chronic renal failure and require outpatient hemodialysis to qualify for Medicaid. Pt may at that time also apply for disability , willl clarify with finance. Jasmine Pang RN MPH, case manager, [phone]

## 2013-11-20 NOTE — Progress Notes (Addendum)
Subjective:  No acute events overnight. Pt with low calcium and Hg this AM.  Pt reports feeling well.  He reports his loose stools has improved. No fever, chills, abdominal pain, nausea, vomiting, or change in urination. No lighteadedness or pre-syncope.      Objective: Vital signs in last 24 hours: Filed Vitals:   11/20/13 1344 11/20/13 1345 11/20/13 1400 11/20/13 1410  BP: 122/83 122/83 149/88 149/85  Pulse: 81 81 79 85  Temp: 97.7 F (36.5 C) 97.7 F (36.5 C) 97.5 F (36.4 C) 97.1 F (36.2 C)  TempSrc: Oral Oral Oral Oral  Resp: 12 12 10 11   Weight:      SpO2:  98%     Weight change:   Intake/Output Summary (Last 24 hours) at 11/20/13 1641 Last data filed at 11/20/13 1500  Gross per 24 hour  Intake   1010 ml  Output   1500 ml  Net   -490 ml   PHYSICAL EXAM:  General - Thin appearing. NAD Cardiac - Normal rate and rhythm  Respiratory - Clear to auscultation bilaterally.   Abdomen  -  Soft, nontender, normal bowel sounds   NEURO- No obvious Cr N abnormality, strenght equal and present in all extremities,  Extremities - + 1 LE pitting edema   Skin - Warm, dry, no rash    Lab Results: Basic Metabolic Panel:  Recent Labs Lab 11/19/13 1018 11/19/13 1510 11/20/13 0755  NA 141  --  141  K 4.1  --  4.5  CL 100  --  104  CO2 13*  --  15*  GLUCOSE 87  --  85  BUN 100*  --  100*  CREATININE 9.83*  --  9.78*  CALCIUM 5.0*  --  5.1*  MG 1.5  --  1.8  PHOS  --  10.3*  --    Liver Function Tests:  Recent Labs Lab 11/19/13 2302  AST 14  ALT <5  ALKPHOS 67  BILITOT 0.3  PROT 6.7  ALBUMIN 3.5   CBC:  Recent Labs Lab 11/19/13 1018 11/20/13 0755 11/20/13 1605  WBC 5.0 3.1* 5.7  NEUTROABS  --  1.6*  --   HGB 7.2* 6.1* 9.4*  HCT 21.2* 18.1* 27.5*  MCV 89.5 87.9 86.8  PLT 172 151 123*   Hemoglobin A1C:  Recent Labs Lab 11/19/13 2302  HGBA1C 5.2    Anemia Panel:  Recent Labs Lab 11/20/13 0755  VITAMINB12 332  FERRITIN 246  TIBC 172*    IRON 53  RETICCTPCT 0.7   Urinalysis:  Recent Labs Lab 11/19/13 2132  COLORURINE YELLOW  LABSPEC 1.007  PHURINE 6.0  GLUCOSEU NEGATIVE  HGBUR LARGE*  BILIRUBINUR NEGATIVE  KETONESUR NEGATIVE  PROTEINUR 100*  UROBILINOGEN 0.2  NITRITE NEGATIVE  LEUKOCYTESUR NEGATIVE    Studies/Results: Dg Chest 1 View  11/20/2013   CLINICAL DATA:  Catheter placement.  EXAM: CHEST - 1 VIEW  COMPARISON:  11/20/2013, 1120 hr  FINDINGS: There is no evidence of right-sided pneumothorax with previous findings consistent with skin fold. The right jugular temporary dialysis catheter shows stable positioning with the catheter tip in the lower SVC. No edema, consolidation or pleural fluid is identified. The heart size and mediastinal contours are within normal limits.  IMPRESSION: No pneumothorax. Temporary dialysis catheter shows stable positioning in the lower SVC.   Electronically Signed   By: Aletta Edouard M.D.   On: 11/20/2013 15:13   US Renal  11/19/2013   CLINICAL DATA:  Abnormal  renal function studies in the setting of hypertension with history of questionable left renal mass  EXAM: RENAL/URINARY TRACT ULTRASOUND COMPLETE  COMPARISON:  None.  FINDINGS: Visualization of the kidneys is limited due to the patient's body habitus.  Right Kidney:  Length: 7.0 cm. The echotexture of the renal cortex is increased. There are multiple hypoechoic to anechoic structures which likely reflect cysts. In the upper pole there are two presumed cystic structures. One measures 1 cm in greatest dimension while the second measures 0.7 cm in greatest dimension. In the lower pole there is a probable cyst measuring 1 cm in greatest dimension.  Left Kidney:  Length: 8.3 mm. Echogenicity appears in increased. Multiple hypoechoic and anechoic structures are demonstrated. In the upper pole there is a structure measuring 1.6 x 1.2 x 1.3 cm. In the mid to upper pole there is a structure measuring 1.3 cm in greatest dimension. In the  midpole laterally there is a structure measuring 1.5 cm in greatest dimension. In the lower pole there is structure measuring 1.2 cm in greatest dimension.  Bladder:  Appears normal for degree of bladder distention. Prominent perivesicle vascular structures are demonstrated.  IMPRESSION: 1. The study is limited due to the patient's body habitus. There is increased cortical echotexture both kidneys which may reflect medical renal disease. There is no hydronephrosis. There are multiple hypoechoic to anechoic structures most compatible with cysts. The urinary bladder is grossly normal. 2. A renal protocol MRI would be a useful next imaging step.   Electronically Signed   By: David  Martinique   On: 11/19/2013 20:04   Dg Chest Port 1 View  11/20/2013   CLINICAL DATA:  Catheter placement, history hypertension  EXAM: Portable exam 1118 hr compared to 11/19/2013  COMPARISON:  None.  FINDINGS: RIGHT jugular central venous catheter with tip projecting over SVC.  Normal heart size, mediastinal contours, and pulmonary vascularity.  Minimal tortuosity thoracic aorta noted.  Lungs clear.  No pleural effusion segmental consolidation.  Linear density projects over the lateral margin of the RIGHT upper lobe, potentially a skin fold but unable to completely exclude pneumothorax.  IMPRESSION: RIGHT jugular catheter tip projects over SVC.  Suspect skin fold at RIGHT apex though difficult to completely exclude pneumothorax; followup upright PA chest radiograph with expiratory technique recommended to exclude pneumothorax.   Electronically Signed   By: Lavonia Dana M.D.   On: 11/20/2013 12:03   Dg Chest Portable 1 View  11/19/2013   CLINICAL DATA:  History of hypertension, former smoker, need dialysis  EXAM: PORTABLE CHEST - 1 VIEW  COMPARISON:  None.  FINDINGS: The heart size and mediastinal contours are within normal limits. Both lungs are clear. The visualized skeletal structures are unremarkable.  IMPRESSION: No active disease.    Electronically Signed   By: Skipper Cliche M.D.   On: 11/19/2013 10:36   Medications:  Prior to Admission:  Prescriptions prior to admission  Medication Sig Dispense Refill  . acetaminophen (TYLENOL) 500 MG tablet Take 1,000 mg by mouth every 6 (six) hours as needed for mild pain.      Marland Kitchen amLODipine-benazepril (LOTREL) 5-20 MG per capsule Take 1 capsule by mouth daily.       Scheduled Meds: . amLODipine  10 mg Oral Daily  . calcitRIOL  0.5 mcg Oral Daily  . calcium carbonate  800 mg of elemental calcium Oral TID WC  . heparin  5,000 Units Subcutaneous 3 times per day  . sodium bicarbonate  1,300 mg Oral  TID   Continuous Infusions:  PRN Meds:.acetaminophen, promethazine Assessment/Plan:  Acute Renal Failure - requiring emergent HD. Pt with Cr on admission pt 9.83 with unknown baseline. Most likely with undiagnosed CKD in setting of uncontrolled HTN. [place] with increased cortical echotexture both kidneys. UA with mild proteinuria, not nephrotic range.  -Appreciate nephrology following -Emergent HD per renal  -Vein mapping, left arm permanent access on Monday -D/c IVF's  -F/U  C3/C4, SPEP, protein/cr ratio -Renal diet with fluid restriction   Hypocalcemia and hyperphosphatemia  - Most likely due to undiagnosed secondary hyperparathyroidism from CKD.  -Renal following   -Replacement as needed  -F/U intact PTH, vitamin D levels (Low 16)  -Consider phoslo with meals  -Tums 500 TID  -Calctriol 0.5 mcg daily   Normocytic Anemia - s/p 2 pRBCs. Hg 7.2 on admission. No active bleeding or symptoms.  -Transfuse if Hg <7 -Monitor CBC  -F/u anemia panel (TIBC low 172)  Anion Gap Metabolic Acidosis - AG 22. On admission, 28 in setting of renal failure  -Sodium bicarbonate 1300 mg TID  Hypertension - Currently normotensive . Pt just started on amlodipine-benazepril 5-20 mg daily.  -Amlodipine 10 mg daily  -Hold ACEi  Diarrhea - improved. Pt with recent loose stools. No diet/medication  changes or antibiotics.  -F/U GI pathogen panel  -F/U c diff toxin    Diet: Renal DVT: SQ Heparin TID  Code: Full   Dispo: Disposition is deferred at this time, awaiting improvement of current medical problems.   The patient does not have a current PCP (No primary provider on file.) and does need an [facility] hospital follow-up appointment after discharge.  The patient does not have transportation limitations that hinder transportation to clinic appointments.  .Services Needed at time of discharge: Y = Yes, Blank = No PT:   OT:   RN:   Equipment:   Other:     LOS: 1 day   Juluis Mire, MD 11/20/2013, 4:41 PM

## 2013-11-21 DIAGNOSIS — Z01810 Encounter for preprocedural cardiovascular examination: Secondary | ICD-10-CM

## 2013-11-21 LAB — CBC WITH DIFFERENTIAL/PLATELET
Basophils Absolute: 0 10*3/uL (ref 0.0–0.1)
Basophils Relative: 1 % (ref 0–1)
Eosinophils Absolute: 0.1 10*3/uL (ref 0.0–0.7)
Eosinophils Relative: 3 % (ref 0–5)
HCT: 25.8 % — ABNORMAL LOW (ref 39.0–52.0)
Hemoglobin: 8.7 g/dL — ABNORMAL LOW (ref 13.0–17.0)
Lymphocytes Relative: 34 % (ref 12–46)
Lymphs Abs: 1.8 10*3/uL (ref 0.7–4.0)
MCH: 29.4 pg (ref 26.0–34.0)
MCHC: 33.7 g/dL (ref 30.0–36.0)
MCV: 87.2 fL (ref 78.0–100.0)
Monocytes Absolute: 0.4 10*3/uL (ref 0.1–1.0)
Monocytes Relative: 8 % (ref 3–12)
Neutro Abs: 2.9 10*3/uL (ref 1.7–7.7)
Neutrophils Relative %: 54 % (ref 43–77)
Platelets: 121 10*3/uL — ABNORMAL LOW (ref 150–400)
RBC: 2.96 MIL/uL — ABNORMAL LOW (ref 4.22–5.81)
RDW: 15.3 % (ref 11.5–15.5)
WBC: 5.2 10*3/uL (ref 4.0–10.5)

## 2013-11-21 LAB — RENAL FUNCTION PANEL
Albumin: 3.4 g/dL — ABNORMAL LOW (ref 3.5–5.2)
BUN: 61 mg/dL — ABNORMAL HIGH (ref 6–23)
CO2: 18 mEq/L — ABNORMAL LOW (ref 19–32)
Calcium: 6.3 mg/dL — CL (ref 8.4–10.5)
Chloride: 102 mEq/L (ref 96–112)
Creatinine, Ser: 6.97 mg/dL — ABNORMAL HIGH (ref 0.50–1.35)
GFR calc Af Amer: 9 mL/min — ABNORMAL LOW (ref >90)
GFR calc non Af Amer: 7 mL/min — ABNORMAL LOW (ref >90)
Glucose, Bld: 74 mg/dL (ref 70–99)
Phosphorus: 6 mg/dL — ABNORMAL HIGH (ref 2.3–4.6)
Potassium: 4 mEq/L (ref 3.7–5.3)
Sodium: 142 mEq/L (ref 137–147)

## 2013-11-21 LAB — TYPE AND SCREEN
ABO/RH(D): O POS
Antibody Screen: NEGATIVE
Unit division: 0
Unit division: 0

## 2013-11-21 LAB — VITAMIN D 25 HYDROXY (VIT D DEFICIENCY, FRACTURES): Vit D, 25-Hydroxy: 19 ng/mL — ABNORMAL LOW (ref 30–89)

## 2013-11-21 LAB — C3 COMPLEMENT: C3 Complement: 56 mg/dL — ABNORMAL LOW (ref 90–180)

## 2013-11-21 LAB — C4 COMPLEMENT: Complement C4, Body Fluid: 20 mg/dL (ref 10–40)

## 2013-11-21 MED ORDER — SODIUM CHLORIDE 0.9 % IV SOLN
2.0000 g | Freq: Once | INTRAVENOUS | Status: AC
  Administered 2013-11-21: 2 g via INTRAVENOUS
  Filled 2013-11-21: qty 20

## 2013-11-21 MED ORDER — LABETALOL HCL 5 MG/ML IV SOLN
5.0000 mg | INTRAVENOUS | Status: DC | PRN
  Filled 2013-11-21: qty 4

## 2013-11-21 NOTE — Progress Notes (Signed)
Subjective: Patient denies complaints this am. He is tolerating HD and eating tolerating po. Still no bowel movement   Objective: Vital signs in last 24 hours: Filed Vitals:   11/20/13 1400 11/20/13 1410 11/20/13 2100 11/21/13 0500  BP: 149/88 149/85 148/83 152/81  Pulse: 79 85 87 79  Temp: 97.5 F (36.4 C) 97.1 F (36.2 C) 98 F (36.7 C) 98.5 F (36.9 C)  TempSrc: Oral Oral Oral Oral  Resp: 10 11 18 18   Weight:      SpO2:   100% 98%   Weight change: 3 lb 4.9 oz (1.5 kg)  Intake/Output Summary (Last 24 hours) at 11/21/13 0951 Last data filed at 11/20/13 1500  Gross per 24 hour  Intake    890 ml  Output   1000 ml  Net   -110 ml   Vitals reviewed. General: resting in bed, NAD HEENT: [place]/at, no scleral icterus Cardiac: RRR, no rubs, murmurs or gallops Pulm: clear to auscultation bilaterally, no wheezes, rales, or rhonchi Abd: soft, nontender, nondistended, BS present Ext: warm and well perfused, trace pedal edema (improved) Neuro: alert and oriented X3, CN 2-12 grossly intact, moving all 4 extremities Lab Results: Basic Metabolic Panel:  Recent Labs Lab 11/19/13 1018 11/19/13 1510 11/20/13 0755 11/21/13 0550  NA 141  --  141 142  K 4.1  --  4.5 4.0  CL 100  --  104 102  CO2 13*  --  15* 18*  GLUCOSE 87  --  85 74  BUN 100*  --  100* 61*  CREATININE 9.83*  --  9.78* 6.97*  CALCIUM 5.0*  --  5.1* 6.3*  MG 1.5  --  1.8  --   PHOS  --  10.3*  --  6.0*   Liver Function Tests:  Recent Labs Lab 11/19/13 2302 11/21/13 0550  AST 14  --   ALT <5  --   ALKPHOS 67  --   BILITOT 0.3  --   PROT 6.7  --   ALBUMIN 3.5 3.4*   CBC:  Recent Labs Lab 11/20/13 0755 11/20/13 1605 11/21/13 0550  WBC 3.1* 5.7 5.2  NEUTROABS 1.6*  --  2.9  HGB 6.1* 9.4* 8.7*  HCT 18.1* 27.5* 25.8*  MCV 87.9 86.8 87.2  PLT 151 123* 121*   Hemoglobin A1C:  Recent Labs Lab 11/19/13 2302  HGBA1C 5.2   Anemia Panel:  Recent Labs Lab 11/20/13 0755  VITAMINB12 332    FERRITIN 246  TIBC 172*  IRON 53  RETICCTPCT 0.7   Urinalysis:  Recent Labs Lab 11/19/13 2132  COLORURINE YELLOW  LABSPEC 1.007  PHURINE 6.0  GLUCOSEU NEGATIVE  HGBUR LARGE*  BILIRUBINUR NEGATIVE  KETONESUR NEGATIVE  PROTEINUR 100*  UROBILINOGEN 0.2  NITRITE NEGATIVE  LEUKOCYTESUR NEGATIVE   Misc. Labs: SPEP, kappa lambda  Studies/Results: Dg Chest 1 View  11/20/2013   CLINICAL DATA:  Catheter placement.  EXAM: CHEST - 1 VIEW  COMPARISON:  11/20/2013, 1120 hr  FINDINGS: There is no evidence of right-sided pneumothorax with previous findings consistent with skin fold. The right jugular temporary dialysis catheter shows stable positioning with the catheter tip in the lower SVC. No edema, consolidation or pleural fluid is identified. The heart size and mediastinal contours are within normal limits.  IMPRESSION: No pneumothorax. Temporary dialysis catheter shows stable positioning in the lower SVC.   Electronically Signed   By: Aletta Edouard M.D.   On: 11/20/2013 15:13   US Renal  11/19/2013  CLINICAL DATA:  Abnormal renal function studies in the setting of hypertension with history of questionable left renal mass  EXAM: RENAL/URINARY TRACT ULTRASOUND COMPLETE  COMPARISON:  None.  FINDINGS: Visualization of the kidneys is limited due to the patient's body habitus.  Right Kidney:  Length: 7.0 cm. The echotexture of the renal cortex is increased. There are multiple hypoechoic to anechoic structures which likely reflect cysts. In the upper pole there are two presumed cystic structures. One measures 1 cm in greatest dimension while the second measures 0.7 cm in greatest dimension. In the lower pole there is a probable cyst measuring 1 cm in greatest dimension.  Left Kidney:  Length: 8.3 mm. Echogenicity appears in increased. Multiple hypoechoic and anechoic structures are demonstrated. In the upper pole there is a structure measuring 1.6 x 1.2 x 1.3 cm. In the mid to upper pole there is a  structure measuring 1.3 cm in greatest dimension. In the midpole laterally there is a structure measuring 1.5 cm in greatest dimension. In the lower pole there is structure measuring 1.2 cm in greatest dimension.  Bladder:  Appears normal for degree of bladder distention. Prominent perivesicle vascular structures are demonstrated.  IMPRESSION: 1. The study is limited due to the patient's body habitus. There is increased cortical echotexture both kidneys which may reflect medical renal disease. There is no hydronephrosis. There are multiple hypoechoic to anechoic structures most compatible with cysts. The urinary bladder is grossly normal. 2. A renal protocol MRI would be a useful next imaging step.   Electronically Signed   By: David  Martinique   On: 11/19/2013 20:04   Dg Chest Port 1 View  11/20/2013   CLINICAL DATA:  Catheter placement, history hypertension  EXAM: Portable exam 1118 hr compared to 11/19/2013  COMPARISON:  None.  FINDINGS: RIGHT jugular central venous catheter with tip projecting over SVC.  Normal heart size, mediastinal contours, and pulmonary vascularity.  Minimal tortuosity thoracic aorta noted.  Lungs clear.  No pleural effusion segmental consolidation.  Linear density projects over the lateral margin of the RIGHT upper lobe, potentially a skin fold but unable to completely exclude pneumothorax.  IMPRESSION: RIGHT jugular catheter tip projects over SVC.  Suspect skin fold at RIGHT apex though difficult to completely exclude pneumothorax; followup upright PA chest radiograph with expiratory technique recommended to exclude pneumothorax.   Electronically Signed   By: Lavonia Dana M.D.   On: 11/20/2013 12:03   Dg Chest Portable 1 View  11/19/2013   CLINICAL DATA:  History of hypertension, former smoker, need dialysis  EXAM: PORTABLE CHEST - 1 VIEW  COMPARISON:  None.  FINDINGS: The heart size and mediastinal contours are within normal limits. Both lungs are clear. The visualized skeletal  structures are unremarkable.  IMPRESSION: No active disease.   Electronically Signed   By: Skipper Cliche M.D.   On: 11/19/2013 10:36   Medications:  Scheduled Meds: . amLODipine  10 mg Oral Daily  . calcitRIOL  0.5 mcg Oral Daily  . calcium carbonate  800 mg of elemental calcium Oral TID WC  . calcium gluconate  2 g Intravenous Once  . heparin  5,000 Units Subcutaneous 3 times per day  . sodium bicarbonate  1,300 mg Oral TID   Continuous Infusions:  PRN Meds:.acetaminophen, promethazine Assessment/Plan: 63 y.o PMH HTN (no on medications) presents for likely acute on chronic CKD now ESRD requiring HD.   #ESRD on HD -likely related to uncontrolled HTN over years. HA1C 5.2. Associated with normocytic  anemia, hypoparathyroidism, elevated phosphorus -He is tolerating HD with improvement in Creatinine (unknown baseline)  -renal and VVS following. HD per renal.  He will have access placement on Monday -on TUMS TID, Calcitriol 0.5, NaHCO3 tid per renal   #HTN (hypertension) -on Norvasc 10 qd  -avoid ACEI/diuretic at this time  -monitor VS   #Diarrhea -pending C.dif. Diarrhea could have been 2/2 renal failure   #Normocytic anemia -likely related to ESRD. Hbg was as low as 6.1 s/p 2 units blood hbg 8.7 this am  -trend CBC  #F/E/N -NSL -monitor RFP. Ca 7.1 given Ca gluconate 2 grams on TUMS TID. Will trend  -renal diet   #DVT px  -heparin, scds    Dispo: Disposition is deferred at this time, awaiting improvement of current medical problems.  Anticipated discharge in approximately 3-4 day(s).   The patient does not have a current PCP but recently went to [facility] outpatient and does not need an [facility] hospital follow-up appointment after discharge.  The patient does not have transportation limitations that hinder transportation to clinic appointments.  .Services Needed at time of discharge: Y = Yes, Blank = No PT:   OT:   RN:   Equipment:   Other:     LOS: 2 days    Cresenciano Genre, MD [phone] 11/21/2013, 9:51 AM

## 2013-11-21 NOTE — Progress Notes (Signed)
VASCULAR LAB PRELIMINARY  PRELIMINARY  PRELIMINARY  PRELIMINARY  Right  Upper Extremity Vein Map    Cephalic  Segment Diameter Comment  1. Axilla mm Unable to follow  2. Mid upper arm mm Unable to follow  3. Above AC  mm Unable to follow  4. In AC 2.2 mm   5. Below AC mm Not visualized  6. Mid forearm 2.4 mm Patent, compressible, thickening with a branch  7. Wrist 3.1 mm Patent, compressible, thickening   Basilic  Segment Diameter Depth Comment  2. Mid upper arm 5.1 mm 4.1 mm Patent, compressible, enters brachial  3. Above AC 4.5 mm 2.8 mm Patent compressible  4. In AC 4.5 mm 2.6 mm Patent compressible, branch  5. Below AC 3.0 mm 1.1 mm Patent compressible, multiple branches  6. Mid forearm 3.2 mm 2.7 mm Patent compressible  7. Wrist 1.9 mm 1.3 mm Patent compressible, multiple branches      Left Upper Extremity Vein Map    Cephalic  Segment Diameter Comment  1. Axilla mm Unable to follow  2. Mid upper arm mm Unable to follow  3. Above AC mm Unable to follow  4. In AC mm Unable to follow  5. Below AC mm Unable to follow  6. Mid forearm mm Unable to follow  7. Wrist 2.2 mm Patent compressible thickened   Basilic  Segment Diameter Depth Comment  2. Mid upper arm 3.6 mm 1.1 mm Patent compressible  3. Above AC 4.0 mm 4.6 mm Patent compressible,multiple branches  4. In AC 4.1 mm 3.6 mm Patent compressible, multiple branches  5. Below AC 3.3 mm 5.2 mm Patent compressible  6. Mid forearm mm mm Multiple branches unable to identify the native vessel  7. Wrist mm mm Unable to identify the native vessel    Nayomi Tabron D Orlen Leedy, RVS 11/21/2013, 3:51 PM

## 2013-11-21 NOTE — Procedures (Signed)
I have seen and examined this patient and agree with the plan of care. Tolerating dialysis  Sherril Croon 11/21/2013, 12:30 PM

## 2013-11-21 NOTE — Progress Notes (Signed)
Patient blood pressure elevated at 171/103 with HR 99 on return from dialysis.  Patient HR in the 130's while ambulating to the restroom.  Dr. Gordy Levan notified.  Scheduled amlodipine given.  Will continue to monitor.

## 2013-11-22 LAB — CBC WITH DIFFERENTIAL/PLATELET
Basophils Absolute: 0 10*3/uL (ref 0.0–0.1)
Basophils Relative: 0 % (ref 0–1)
Eosinophils Absolute: 0 10*3/uL (ref 0.0–0.7)
Eosinophils Relative: 0 % (ref 0–5)
HCT: 34.6 % — ABNORMAL LOW (ref 39.0–52.0)
Hemoglobin: 11.2 g/dL — ABNORMAL LOW (ref 13.0–17.0)
Lymphocytes Relative: 7 % — ABNORMAL LOW (ref 12–46)
Lymphs Abs: 0.3 10*3/uL — ABNORMAL LOW (ref 0.7–4.0)
MCH: 28.9 pg (ref 26.0–34.0)
MCHC: 32.4 g/dL (ref 30.0–36.0)
MCV: 89.2 fL (ref 78.0–100.0)
Monocytes Absolute: 0.2 10*3/uL (ref 0.1–1.0)
Monocytes Relative: 5 % (ref 3–12)
Neutro Abs: 3.7 10*3/uL (ref 1.7–7.7)
Neutrophils Relative %: 88 % — ABNORMAL HIGH (ref 43–77)
Platelets: 91 10*3/uL — ABNORMAL LOW (ref 150–400)
RBC: 3.88 MIL/uL — ABNORMAL LOW (ref 4.22–5.81)
RDW: 15.5 % (ref 11.5–15.5)
WBC: 4.3 10*3/uL (ref 4.0–10.5)

## 2013-11-22 LAB — RENAL FUNCTION PANEL
Albumin: 3.6 g/dL (ref 3.5–5.2)
BUN: 39 mg/dL — ABNORMAL HIGH (ref 6–23)
CO2: 24 mEq/L (ref 19–32)
Calcium: 7.3 mg/dL — ABNORMAL LOW (ref 8.4–10.5)
Chloride: 98 mEq/L (ref 96–112)
Creatinine, Ser: 5.45 mg/dL — ABNORMAL HIGH (ref 0.50–1.35)
GFR calc Af Amer: 12 mL/min — ABNORMAL LOW (ref >90)
GFR calc non Af Amer: 10 mL/min — ABNORMAL LOW (ref >90)
Glucose, Bld: 114 mg/dL — ABNORMAL HIGH (ref 70–99)
Phosphorus: 5.1 mg/dL — ABNORMAL HIGH (ref 2.3–4.6)
Potassium: 3.4 mEq/L — ABNORMAL LOW (ref 3.7–5.3)
Sodium: 142 mEq/L (ref 137–147)

## 2013-11-22 LAB — CLOSTRIDIUM DIFFICILE BY PCR: Toxigenic C. Difficile by PCR: NEGATIVE

## 2013-11-22 LAB — MAGNESIUM: Magnesium: 1.9 mg/dL (ref 1.5–2.5)

## 2013-11-22 MED ORDER — DEXTROSE 5 % IV SOLN
1.5000 g | INTRAVENOUS | Status: AC
  Administered 2013-11-23: 1.5 g via INTRAVENOUS
  Filled 2013-11-22: qty 1.5

## 2013-11-22 MED ORDER — SODIUM CHLORIDE 0.9 % IV SOLN: 100.0000 mL | INTRAVENOUS | Status: DC | PRN

## 2013-11-22 MED ORDER — ALTEPLASE 2 MG IJ SOLR
2.0000 mg | Freq: Once | INTRAMUSCULAR | Status: AC | PRN
  Filled 2013-11-22: qty 2

## 2013-11-22 MED ORDER — NEPRO/CARBSTEADY PO LIQD: 237.0000 mL | ORAL | Status: DC | PRN

## 2013-11-22 MED ORDER — LIDOCAINE HCL (PF) 1 % IJ SOLN: 5.0000 mL | INTRAMUSCULAR | Status: DC | PRN

## 2013-11-22 MED ORDER — ALTEPLASE 2 MG IJ SOLR: 2.0000 mg | Freq: Once | INTRAMUSCULAR | Status: DC | PRN

## 2013-11-22 MED ORDER — HEPARIN SODIUM (PORCINE) 1000 UNIT/ML DIALYSIS: 1000.0000 [IU] | INTRAMUSCULAR | Status: DC | PRN

## 2013-11-22 MED ORDER — LIDOCAINE-PRILOCAINE 2.5-2.5 % EX CREA: 1.0000 "application " | TOPICAL_CREAM | CUTANEOUS | Status: DC | PRN

## 2013-11-22 MED ORDER — PENTAFLUOROPROP-TETRAFLUOROETH EX AERO: 1.0000 "application " | INHALATION_SPRAY | CUTANEOUS | Status: DC | PRN

## 2013-11-22 NOTE — Consult Note (Addendum)
Vein map reviewed, poor quality cephalic vein bilaterally.  Basilic vein is good on both sides  Filed Vitals:   11/21/13 1600 11/21/13 1631 11/22/13 0112 11/22/13 0500  BP: 164/109 171/103  169/85  Pulse: 94 99  108  Temp: 98.1 F (36.7 C) 98.6 F (37 C)  98.7 F (37.1 C)  TempSrc: Oral Oral  Oral  Resp: 12 16  18   Height:      Weight: 147 lb 14.9 oz (67.1 kg)  147 lb 14.9 oz (67.1 kg)   SpO2: 93% 100%  95%    2+ radial brachial pulse bilaterally, pt with IV in left antecubital area,  Visible cephalic vein on skin surface  A: Needs hemodialysis access P: most likely left basilic vein fistula in OR tomorrow and diatek but has cephalic vein on skin surface that might be reasonable to explore in OR  Needs all IVs removed from left arm  NPO, consent, cefuroxime on call.  Procedure details risk benefits discussed with pt.  He agrees to proceed.  [name], MD [facility] Office: [phone] Pager: [phone]

## 2013-11-22 NOTE — Progress Notes (Signed)
Subjective: Patient had decreased appetite this am and abdominal cramping otherwise feeling fine.     Objective: Vital signs in last 24 hours: Filed Vitals:   11/21/13 1600 11/21/13 1631 11/22/13 0112 11/22/13 0500  BP: 164/109 171/103  169/85  Pulse: 94 99  108  Temp: 98.1 F (36.7 C) 98.6 F (37 C)  98.7 F (37.1 C)  TempSrc: Oral Oral  Oral  Resp: 12 16  18   Height:      Weight: 147 lb 14.9 oz (67.1 kg)  147 lb 14.9 oz (67.1 kg)   SpO2: 93% 100%  95%   Weight change: -2 lb 3.3 oz (-1 kg)  Intake/Output Summary (Last 24 hours) at 11/22/13 1033 Last data filed at 11/21/13 1600  Gross per 24 hour  Intake    440 ml  Output   1500 ml  Net  -1060 ml   Vitals reviewed. General: resting in bed, NAD HEENT: [place]/at, no scleral icterus Cardiac: RRR, no rubs, murmurs or gallops Pulm: clear to auscultation bilaterally, no wheezes, rales, or rhonchi Abd: soft, nontender, nondistended, BS present Ext: warm and well perfused, no pedal edema (improved) Neuro: alert and oriented X3, CN 2-12 grossly intact, moving all 4 extremities Lab Results: Basic Metabolic Panel:  Recent Labs Lab 11/20/13 0755 11/21/13 0550 11/22/13 0344  NA 141 142 142  K 4.5 4.0 3.4*  CL 104 102 98  CO2 15* 18* 24  GLUCOSE 85 74 114*  BUN 100* 61* 39*  CREATININE 9.78* 6.97* 5.45*  CALCIUM 5.1* 6.3* 7.3*  MG 1.8  --  1.9  PHOS  --  6.0* 5.1*   Liver Function Tests:  Recent Labs Lab 11/19/13 2302 11/21/13 0550 11/22/13 0344  AST 14  --   --   ALT <5  --   --   ALKPHOS 67  --   --   BILITOT 0.3  --   --   PROT 6.7  --   --   ALBUMIN 3.5 3.4* 3.6   CBC:  Recent Labs Lab 11/21/13 0550 11/22/13 0344  WBC 5.2 4.3  NEUTROABS 2.9 3.7  HGB 8.7* 11.2*  HCT 25.8* 34.6*  MCV 87.2 89.2  PLT 121* 91*   Hemoglobin A1C:  Recent Labs Lab 11/19/13 2302  HGBA1C 5.2   Anemia Panel:  Recent Labs Lab 11/20/13 0755  VITAMINB12 332  FERRITIN 246  TIBC 172*  IRON 53  RETICCTPCT 0.7    Urinalysis:  Recent Labs Lab 11/19/13 2132  COLORURINE YELLOW  LABSPEC 1.007  PHURINE 6.0  GLUCOSEU NEGATIVE  HGBUR LARGE*  BILIRUBINUR NEGATIVE  KETONESUR NEGATIVE  PROTEINUR 100*  UROBILINOGEN 0.2  NITRITE NEGATIVE  LEUKOCYTESUR NEGATIVE   Misc. Labs: SPEP, kappa lambda  Studies/Results: Dg Chest 1 View  11/20/2013   CLINICAL DATA:  Catheter placement.  EXAM: CHEST - 1 VIEW  COMPARISON:  11/20/2013, 1120 hr  FINDINGS: There is no evidence of right-sided pneumothorax with previous findings consistent with skin fold. The right jugular temporary dialysis catheter shows stable positioning with the catheter tip in the lower SVC. No edema, consolidation or pleural fluid is identified. The heart size and mediastinal contours are within normal limits.  IMPRESSION: No pneumothorax. Temporary dialysis catheter shows stable positioning in the lower SVC.   Electronically Signed   By: Aletta Edouard M.D.   On: 11/20/2013 15:13   Dg Chest Port 1 View  11/20/2013   CLINICAL DATA:  Catheter placement, history hypertension  EXAM: Portable exam 1118 hr  compared to 11/19/2013  COMPARISON:  None.  FINDINGS: RIGHT jugular central venous catheter with tip projecting over SVC.  Normal heart size, mediastinal contours, and pulmonary vascularity.  Minimal tortuosity thoracic aorta noted.  Lungs clear.  No pleural effusion segmental consolidation.  Linear density projects over the lateral margin of the RIGHT upper lobe, potentially a skin fold but unable to completely exclude pneumothorax.  IMPRESSION: RIGHT jugular catheter tip projects over SVC.  Suspect skin fold at RIGHT apex though difficult to completely exclude pneumothorax; followup upright PA chest radiograph with expiratory technique recommended to exclude pneumothorax.   Electronically Signed   By: Lavonia Dana M.D.   On: 11/20/2013 12:03   Medications:  Scheduled Meds: . amLODipine  10 mg Oral Daily  . calcitRIOL  0.5 mcg Oral Daily  . calcium  carbonate  800 mg of elemental calcium Oral TID WC  . [START ON 11/23/2013] cefUROXime (ZINACEF)  IV  1.5 g Intravenous On Call to OR  . heparin  5,000 Units Subcutaneous 3 times per day  . sodium bicarbonate  1,300 mg Oral TID   Continuous Infusions:  PRN Meds:.sodium chloride, sodium chloride, acetaminophen, alteplase, feeding supplement (NEPRO CARB STEADY), heparin, labetalol, lidocaine (PF), lidocaine-prilocaine, pentafluoroprop-tetrafluoroeth, promethazine Assessment/Plan: 63 y.o PMH HTN (no on medications) presents for likely acute on chronic CKD now ESRD requiring HD.   #ESRD on HD -likely related to uncontrolled HTN over years. HA1C 5.2. Associated with normocytic anemia, hypoparathyroidism, elevated phosphoru. He has low C3,C4 will check ANA -He is tolerating HD with improvement in Creatinine (unknown baseline)  -renal and VVS following. HD per renal.  He will have access placement on Monday and HD again MOnday  -on TUMS TID, Calcitriol 0.5, NaHCO3 tid per renal   #HTN (hypertension) -on Norvasc 10 qd, prn Labetalol 5 mg iv prn BP >180/100 -avoid ACEI/diuretic at this time  -monitor VS   #Diarrhea -neg C.dif. Diarrhea could have been 2/2 renal failure, GI stool Cx pending   #Normocytic anemia -likely related to ESRD. Hbg was as low as 6.1 s/p 2 units blood hbg 11.2 this am  -trend CBC  #F/E/N -NSL -monitor RFP. Ca 8.1on TUMS TID. Will trend  -renal diet   #DVT px  -heparin, scds    Dispo: Disposition is deferred at this time, awaiting improvement of current medical problems.  Anticipated discharge in approximately 3-4 day(s).   The patient does not have a current PCP but recently went to [facility] outpatient and does not need an [facility] hospital follow-up appointment after discharge.  The patient does not have transportation limitations that hinder transportation to clinic appointments.  .Services Needed at time of discharge: Y = Yes, Blank = No PT:   OT:   RN:    Equipment:   Other:     LOS: 3 days   Cresenciano Genre, MD [phone] 11/22/2013, 10:33 AM

## 2013-11-22 NOTE — Progress Notes (Signed)
[facility] ROUNDING NOTE   Subjective:   Interval History: no complaints   Objective:  Vital signs in last 24 hours:  Temp:  [98.1 F (36.7 C)-98.7 F (37.1 C)] 98.7 F (37.1 C) (05/03 0500) Pulse Rate:  [76-108] 108 (05/03 0500) Resp:  [12-18] 18 (05/03 0500) BP: (142-179)/(85-109) 169/85 mmHg (05/03 0500) SpO2:  [93 %-100 %] 95 % (05/03 0500) Weight:  [67.1 kg (147 lb 14.9 oz)-68.6 kg (151 lb 3.8 oz)] 67.1 kg (147 lb 14.9 oz) (05/03 0112)  Weight change: -1 kg (-2 lb 3.3 oz) Filed Weights   11/21/13 1325 11/21/13 1600 11/22/13 0112  Weight: 68.6 kg (151 lb 3.8 oz) 67.1 kg (147 lb 14.9 oz) 67.1 kg (147 lb 14.9 oz)    Intake/Output: I/O last 3 completed shifts: In: 73 [P.O.:680] Out: 1500 [Other:1500]   Intake/Output this shift:     CVS- RRR RS- CTA ABD- BS present soft non-distended EXT- no edema   Basic Metabolic Panel:  Recent Labs Lab 11/19/13 1018 11/19/13 1510 11/20/13 0755 11/21/13 0550 11/22/13 0344  NA 141  --  141 142 142  K 4.1  --  4.5 4.0 3.4*  CL 100  --  104 102 98  CO2 13*  --  15* 18* 24  GLUCOSE 87  --  85 74 114*  BUN 100*  --  100* 61* 39*  CREATININE 9.83*  --  9.78* 6.97* 5.45*  CALCIUM 5.0*  --  5.1* 6.3* 7.3*  MG 1.5  --  1.8  --  1.9  PHOS  --  10.3*  --  6.0* 5.1*    Liver Function Tests:  Recent Labs Lab 11/19/13 2302 11/21/13 0550 11/22/13 0344  AST 14  --   --   ALT <5  --   --   ALKPHOS 67  --   --   BILITOT 0.3  --   --   PROT 6.7  --   --   ALBUMIN 3.5 3.4* 3.6   No results found for this basename: LIPASE, AMYLASE,  in the last 168 hours No results found for this basename: AMMONIA,  in the last 168 hours  CBC:  Recent Labs Lab 11/19/13 1018 11/20/13 0755 11/20/13 1605 11/21/13 0550 11/22/13 0344  WBC 5.0 3.1* 5.7 5.2 4.3  NEUTROABS  --  1.6*  --  2.9 3.7  HGB 7.2* 6.1* 9.4* 8.7* 11.2*  HCT 21.2* 18.1* 27.5* 25.8* 34.6*  MCV 89.5 87.9 86.8 87.2 89.2  PLT 172 151 123* 121* 91*     Cardiac Enzymes: No results found for this basename: CKTOTAL, CKMB, CKMBINDEX, TROPONINI,  in the last 168 hours  BNP: No components found with this basename: POCBNP,   CBG: No results found for this basename: GLUCAP,  in the last 168 hours  Microbiology: Results for orders placed during the hospital encounter of 11/19/13  CLOSTRIDIUM DIFFICILE BY PCR     Status: None   Collection Time    11/22/13  1:32 AM      Result Value Ref Range Status   C difficile by pcr NEGATIVE  NEGATIVE Final    Coagulation Studies: No results found for this basename: LABPROT, INR,  in the last 72 hours  Urinalysis:  Recent Labs  11/19/13 2132  COLORURINE YELLOW  LABSPEC 1.007  PHURINE 6.0  GLUCOSEU NEGATIVE  HGBUR LARGE*  BILIRUBINUR NEGATIVE  KETONESUR NEGATIVE  PROTEINUR 100*  UROBILINOGEN 0.2  NITRITE NEGATIVE  LEUKOCYTESUR NEGATIVE  Imaging: Dg Chest 1 View  11/20/2013   CLINICAL DATA:  Catheter placement.  EXAM: CHEST - 1 VIEW  COMPARISON:  11/20/2013, 1120 hr  FINDINGS: There is no evidence of right-sided pneumothorax with previous findings consistent with skin fold. The right jugular temporary dialysis catheter shows stable positioning with the catheter tip in the lower SVC. No edema, consolidation or pleural fluid is identified. The heart size and mediastinal contours are within normal limits.  IMPRESSION: No pneumothorax. Temporary dialysis catheter shows stable positioning in the lower SVC.   Electronically Signed   By: Aletta Edouard M.D.   On: 11/20/2013 15:13     Medications:     . amLODipine  10 mg Oral Daily  . calcitRIOL  0.5 mcg Oral Daily  . calcium carbonate  800 mg of elemental calcium Oral TID WC  . [START ON 11/23/2013] cefUROXime (ZINACEF)  IV  1.5 g Intravenous On Call to OR  . heparin  5,000 Units Subcutaneous 3 times per day  . sodium bicarbonate  1,300 mg Oral TID   sodium chloride, sodium chloride, acetaminophen, alteplase, feeding supplement  (NEPRO CARB STEADY), heparin, labetalol, lidocaine (PF), lidocaine-prilocaine, pentafluoroprop-tetrafluoroeth, promethazine  Assessment/ Plan:  63 y.o PMH HTN (no on medications) presents for likely acute on chronic CKD now ESRD requiring HD.    ESRD   [facility] office  Tolerating dialysis and seems to be doing well. Will need outpatient slot and transportation. Lives in [place] and has a girlfriend , Sharyn Lull   Supportive of patient.   Metabolic acidosis resolved  Bone/Mineral  PTH   pending   Phos 5.1  Calcium 7.3    Anemia stable Iron studies good  Nutrition stable    LOS: 3 Sherril Croon @TODAY @11 :54 AM

## 2013-11-23 ENCOUNTER — Inpatient Hospital Stay (HOSPITAL_COMMUNITY): Payer: Medicaid Other

## 2013-11-23 ENCOUNTER — Encounter (HOSPITAL_COMMUNITY): Payer: Self-pay | Admitting: Anesthesiology

## 2013-11-23 ENCOUNTER — Inpatient Hospital Stay (HOSPITAL_COMMUNITY): Payer: Medicaid Other | Admitting: Anesthesiology

## 2013-11-23 ENCOUNTER — Encounter (HOSPITAL_COMMUNITY)
Admission: EM | Disposition: A | Discharge status: Home / Self Care | Payer: Self-pay | Source: Home / Self Care | Attending: Internal Medicine

## 2013-11-23 ENCOUNTER — Encounter (HOSPITAL_COMMUNITY): Payer: Medicaid Other | Admitting: Anesthesiology

## 2013-11-23 HISTORY — PX: INSERTION OF DIALYSIS CATHETER: SHX1324

## 2013-11-23 HISTORY — PX: AV FISTULA PLACEMENT: SHX1204

## 2013-11-23 LAB — GI PATHOGEN PANEL BY PCR, STOOL
C difficile toxin A/B: NEGATIVE
Campylobacter by PCR: NEGATIVE
Cryptosporidium by PCR: NEGATIVE
E coli (ETEC) LT/ST: NEGATIVE
E coli (STEC): NEGATIVE
E coli 0157 by PCR: NEGATIVE
G lamblia by PCR: NEGATIVE
Norovirus GI/GII: NEGATIVE
Rotavirus A by PCR: NEGATIVE
Salmonella by PCR: NEGATIVE
Shigella by PCR: NEGATIVE

## 2013-11-23 LAB — BASIC METABOLIC PANEL
BUN: 53 mg/dL — ABNORMAL HIGH (ref 6–23)
CO2: 24 mEq/L (ref 19–32)
Calcium: 6.5 mg/dL — ABNORMAL LOW (ref 8.4–10.5)
Chloride: 98 mEq/L (ref 96–112)
Creatinine, Ser: 7.09 mg/dL — ABNORMAL HIGH (ref 0.50–1.35)
GFR calc Af Amer: 8 mL/min — ABNORMAL LOW (ref >90)
GFR calc non Af Amer: 7 mL/min — ABNORMAL LOW (ref >90)
Glucose, Bld: 82 mg/dL (ref 70–99)
Potassium: 3.4 mEq/L — ABNORMAL LOW (ref 3.7–5.3)
Sodium: 143 mEq/L (ref 137–147)

## 2013-11-23 LAB — PTH, INTACT AND CALCIUM
Calcium, Total (PTH): 5.1 mg/dL — CL (ref 8.4–10.5)
PTH: 1107.3 pg/mL — ABNORMAL HIGH (ref 14.0–72.0)

## 2013-11-23 LAB — RENAL FUNCTION PANEL
Albumin: 3.4 g/dL — ABNORMAL LOW (ref 3.5–5.2)
BUN: 53 mg/dL — ABNORMAL HIGH (ref 6–23)
CO2: 23 mEq/L (ref 19–32)
Calcium: 6.4 mg/dL — CL (ref 8.4–10.5)
Chloride: 97 mEq/L (ref 96–112)
Creatinine, Ser: 6.96 mg/dL — ABNORMAL HIGH (ref 0.50–1.35)
GFR calc Af Amer: 9 mL/min — ABNORMAL LOW (ref >90)
GFR calc non Af Amer: 7 mL/min — ABNORMAL LOW (ref >90)
Glucose, Bld: 82 mg/dL (ref 70–99)
Phosphorus: 7.2 mg/dL — ABNORMAL HIGH (ref 2.3–4.6)
Potassium: 3.4 mEq/L — ABNORMAL LOW (ref 3.7–5.3)
Sodium: 142 mEq/L (ref 137–147)

## 2013-11-23 LAB — CBC
HCT: 26.3 % — ABNORMAL LOW (ref 39.0–52.0)
Hemoglobin: 8.7 g/dL — ABNORMAL LOW (ref 13.0–17.0)
MCH: 29.9 pg (ref 26.0–34.0)
MCHC: 33.1 g/dL (ref 30.0–36.0)
MCV: 90.4 fL (ref 78.0–100.0)
Platelets: 110 10*3/uL — ABNORMAL LOW (ref 150–400)
RBC: 2.91 MIL/uL — ABNORMAL LOW (ref 4.22–5.81)
RDW: 15.1 % (ref 11.5–15.5)
WBC: 7.6 10*3/uL (ref 4.0–10.5)

## 2013-11-23 LAB — VITAMIN D 1,25 DIHYDROXY
Vitamin D 1, 25 (OH)2 Total: 27 pg/mL (ref 18–72)
Vitamin D2 1, 25 (OH)2: <8 pg/mL
Vitamin D3 1, 25 (OH)2: 27 pg/mL

## 2013-11-23 LAB — SURGICAL PCR SCREEN
MRSA, PCR: NEGATIVE
Staphylococcus aureus: POSITIVE — AB

## 2013-11-23 LAB — KAPPA/LAMBDA LIGHT CHAINS
Kappa free light chain: 17 mg/dL — ABNORMAL HIGH (ref 0.33–1.94)
Kappa, lambda light chain ratio: 1.7 — ABNORMAL HIGH (ref 0.26–1.65)
Lambda free light chains: 9.99 mg/dL — ABNORMAL HIGH (ref 0.57–2.63)

## 2013-11-23 SURGERY — INSERTION OF DIALYSIS CATHETER
Anesthesia: Monitor Anesthesia Care

## 2013-11-23 MED ORDER — NEPRO/CARBSTEADY PO LIQD: 237.0000 mL | ORAL | Status: DC | PRN

## 2013-11-23 MED ORDER — CALCIUM GLUCONATE 10 % IV SOLN
2.0000 g | Freq: Once | INTRAVENOUS | Status: AC
  Administered 2013-11-23: 2 g via INTRAVENOUS
  Filled 2013-11-23: qty 20

## 2013-11-23 MED ORDER — HEPARIN SODIUM (PORCINE) 1000 UNIT/ML DIALYSIS: 1000.0000 [IU] | INTRAMUSCULAR | Status: DC | PRN

## 2013-11-23 MED ORDER — PENTAFLUOROPROP-TETRAFLUOROETH EX AERO: 1.0000 "application " | INHALATION_SPRAY | CUTANEOUS | Status: DC | PRN

## 2013-11-23 MED ORDER — SODIUM CHLORIDE 0.9 % IR SOLN
Status: DC | PRN
  Administered 2013-11-23: 07:00:00

## 2013-11-23 MED ORDER — ALTEPLASE 2 MG IJ SOLR: 2.0000 mg | Freq: Once | INTRAMUSCULAR | Status: DC | PRN

## 2013-11-23 MED ORDER — PHENYLEPHRINE HCL 10 MG/ML IJ SOLN
10.0000 mg | INTRAMUSCULAR | Status: DC | PRN
  Administered 2013-11-23: 30 ug/min via INTRAVENOUS

## 2013-11-23 MED ORDER — PROPOFOL 10 MG/ML IV BOLUS
INTRAVENOUS | Status: AC
  Filled 2013-11-23: qty 20

## 2013-11-23 MED ORDER — SODIUM CHLORIDE 0.9 % IV SOLN: 100.0000 mL | INTRAVENOUS | Status: DC | PRN

## 2013-11-23 MED ORDER — HEPARIN SODIUM (PORCINE) 1000 UNIT/ML IJ SOLN
INTRAMUSCULAR | Status: DC | PRN
  Administered 2013-11-23: 4.6 mL

## 2013-11-23 MED ORDER — PROPOFOL 10 MG/ML IV BOLUS
INTRAVENOUS | Status: DC | PRN
  Administered 2013-11-23: 30 mg via INTRAVENOUS

## 2013-11-23 MED ORDER — FENTANYL CITRATE 0.05 MG/ML IJ SOLN
INTRAMUSCULAR | Status: DC | PRN
  Administered 2013-11-23 (×2): 50 ug via INTRAVENOUS

## 2013-11-23 MED ORDER — LIDOCAINE-EPINEPHRINE 0.5 %-1:200000 IJ SOLN
INTRAMUSCULAR | Status: DC | PRN
  Administered 2013-11-23: 25 mL

## 2013-11-23 MED ORDER — MIDAZOLAM HCL 2 MG/2ML IJ SOLN
INTRAMUSCULAR | Status: AC
  Filled 2013-11-23: qty 2

## 2013-11-23 MED ORDER — LIDOCAINE HCL (PF) 1 % IJ SOLN: 5.0000 mL | INTRAMUSCULAR | Status: DC | PRN

## 2013-11-23 MED ORDER — LIDOCAINE-EPINEPHRINE 0.5 %-1:200000 IJ SOLN
INTRAMUSCULAR | Status: AC
  Filled 2013-11-23: qty 1

## 2013-11-23 MED ORDER — HEPARIN SODIUM (PORCINE) 1000 UNIT/ML IJ SOLN
INTRAMUSCULAR | Status: AC
  Filled 2013-11-23: qty 1

## 2013-11-23 MED ORDER — OXYCODONE HCL 5 MG PO TABS: 5.0000 mg | ORAL_TABLET | Freq: Once | ORAL | Status: DC | PRN

## 2013-11-23 MED ORDER — FENTANYL CITRATE 0.05 MG/ML IJ SOLN
INTRAMUSCULAR | Status: AC
  Filled 2013-11-23: qty 5

## 2013-11-23 MED ORDER — PROMETHAZINE HCL 25 MG/ML IJ SOLN: 6.2500 mg | INTRAMUSCULAR | Status: DC | PRN

## 2013-11-23 MED ORDER — PHENYLEPHRINE HCL 10 MG/ML IJ SOLN
INTRAMUSCULAR | Status: DC | PRN
  Administered 2013-11-23 (×3): 80 ug via INTRAVENOUS

## 2013-11-23 MED ORDER — OXYCODONE HCL 5 MG/5ML PO SOLN: 5.0000 mg | Freq: Once | ORAL | Status: DC | PRN

## 2013-11-23 MED ORDER — HYDROMORPHONE HCL PF 1 MG/ML IJ SOLN: 0.2500 mg | INTRAMUSCULAR | Status: DC | PRN

## 2013-11-23 MED ORDER — PROPOFOL INFUSION 10 MG/ML OPTIME
INTRAVENOUS | Status: DC | PRN
  Administered 2013-11-23: 50 ug/kg/min via INTRAVENOUS

## 2013-11-23 MED ORDER — LIDOCAINE-PRILOCAINE 2.5-2.5 % EX CREA: 1.0000 "application " | TOPICAL_CREAM | CUTANEOUS | Status: DC | PRN

## 2013-11-23 MED ORDER — LIDOCAINE HCL (CARDIAC) 10 MG/ML IV SOLN
INTRAVENOUS | Status: DC | PRN
  Administered 2013-11-23: 60 mg via INTRAVENOUS

## 2013-11-23 MED ORDER — SODIUM CHLORIDE 0.9 % IV SOLN
INTRAVENOUS | Status: DC | PRN
  Administered 2013-11-23: 07:00:00 via INTRAVENOUS

## 2013-11-23 SURGICAL SUPPLY — 65 items
ARMBAND PINK RESTRICT EXTREMIT (MISCELLANEOUS) ×3 IMPLANT
BAG DECANTER FOR FLEXI CONT (MISCELLANEOUS) ×3 IMPLANT
BENZOIN TINCTURE PRP APPL 2/3 (GAUZE/BANDAGES/DRESSINGS) ×3 IMPLANT
BLADE 10 SAFETY STRL DISP (BLADE) ×3 IMPLANT
CANISTER SUCTION 2500CC (MISCELLANEOUS) ×3 IMPLANT
CATH CANNON HEMO 15F 50CM (CATHETERS) IMPLANT
CATH CANNON HEMO 15FR 19 (HEMODIALYSIS SUPPLIES) IMPLANT
CATH CANNON HEMO 15FR 23CM (HEMODIALYSIS SUPPLIES) ×3 IMPLANT
CATH CANNON HEMO 15FR 31CM (HEMODIALYSIS SUPPLIES) IMPLANT
CATH CANNON HEMO 15FR 32CM (HEMODIALYSIS SUPPLIES) IMPLANT
CLIP LIGATING EXTRA MED SLVR (CLIP) ×3 IMPLANT
CLIP LIGATING EXTRA SM BLUE (MISCELLANEOUS) ×3 IMPLANT
CLSR STERI-STRIP ANTIMIC 1/2X4 (GAUZE/BANDAGES/DRESSINGS) ×3 IMPLANT
COVER PROBE W GEL 5X96 (DRAPES) ×3 IMPLANT
COVER SURGICAL LIGHT HANDLE (MISCELLANEOUS) ×3 IMPLANT
DECANTER SPIKE VIAL GLASS SM (MISCELLANEOUS) ×3 IMPLANT
DRAPE C-ARM 42X72 X-RAY (DRAPES) ×3 IMPLANT
DRAPE CHEST BREAST 15X10 FENES (DRAPES) ×3 IMPLANT
ELECT REM PT RETURN 9FT ADLT (ELECTROSURGICAL) ×3
ELECTRODE REM PT RTRN 9FT ADLT (ELECTROSURGICAL) ×2 IMPLANT
GAUZE SPONGE 2X2 8PLY STRL LF (GAUZE/BANDAGES/DRESSINGS) ×4 IMPLANT
GAUZE SPONGE 4X4 16PLY XRAY LF (GAUZE/BANDAGES/DRESSINGS) ×3 IMPLANT
GEL ULTRASOUND 20GR AQUASONIC (MISCELLANEOUS) IMPLANT
GLOVE BIO SURGEON STRL SZ7.5 (GLOVE) ×9 IMPLANT
GLOVE BIOGEL PI IND STRL 6 (GLOVE) ×2 IMPLANT
GLOVE BIOGEL PI IND STRL 6.5 (GLOVE) ×4 IMPLANT
GLOVE BIOGEL PI IND STRL 7.0 (GLOVE) ×2 IMPLANT
GLOVE BIOGEL PI IND STRL 8 (GLOVE) ×8 IMPLANT
GLOVE BIOGEL PI INDICATOR 6 (GLOVE) ×1
GLOVE BIOGEL PI INDICATOR 6.5 (GLOVE) ×2
GLOVE BIOGEL PI INDICATOR 7.0 (GLOVE) ×1
GLOVE BIOGEL PI INDICATOR 8 (GLOVE) ×4
GLOVE SS BIOGEL STRL SZ 7.5 (GLOVE) ×6 IMPLANT
GLOVE SUPERSENSE BIOGEL SZ 7.5 (GLOVE) ×3
GOWN STRL REUS W/ TWL LRG LVL3 (GOWN DISPOSABLE) ×6 IMPLANT
GOWN STRL REUS W/ TWL XL LVL3 (GOWN DISPOSABLE) ×6 IMPLANT
GOWN STRL REUS W/TWL LRG LVL3 (GOWN DISPOSABLE) ×3
GOWN STRL REUS W/TWL XL LVL3 (GOWN DISPOSABLE) ×3
KIT BASIN OR (CUSTOM PROCEDURE TRAY) ×3 IMPLANT
KIT ROOM TURNOVER OR (KITS) ×3 IMPLANT
NEEDLE 18GX1X1/2 (RX/OR ONLY) (NEEDLE) ×3 IMPLANT
NEEDLE 22X1 1/2 (OR ONLY) (NEEDLE) ×3 IMPLANT
NEEDLE HYPO 25GX1X1/2 BEV (NEEDLE) ×3 IMPLANT
NS IRRIG 1000ML POUR BTL (IV SOLUTION) ×3 IMPLANT
PACK CV ACCESS (CUSTOM PROCEDURE TRAY) ×3 IMPLANT
PACK SURGICAL SETUP 50X90 (CUSTOM PROCEDURE TRAY) ×3 IMPLANT
PAD ARMBOARD 7.5X6 YLW CONV (MISCELLANEOUS) ×6 IMPLANT
SOAP 2 % CHG 4 OZ (WOUND CARE) ×3 IMPLANT
SPONGE GAUZE 2X2 STER 10/PKG (GAUZE/BANDAGES/DRESSINGS) ×2
SPONGE GAUZE 4X4 12PLY (GAUZE/BANDAGES/DRESSINGS) ×3 IMPLANT
STRIP CLOSURE SKIN 1/2X4 (GAUZE/BANDAGES/DRESSINGS) ×3 IMPLANT
SUT ETHILON 3 0 PS 1 (SUTURE) ×3 IMPLANT
SUT PROLENE 6 0 CC (SUTURE) ×3 IMPLANT
SUT VIC AB 3-0 SH 27 (SUTURE) ×1
SUT VIC AB 3-0 SH 27X BRD (SUTURE) ×2 IMPLANT
SUT VICRYL 4-0 PS2 18IN ABS (SUTURE) ×6 IMPLANT
SYR 20CC LL (SYRINGE) ×3 IMPLANT
SYR 5ML LL (SYRINGE) ×6 IMPLANT
SYR CONTROL 10ML LL (SYRINGE) ×3 IMPLANT
SYRINGE 10CC LL (SYRINGE) ×3 IMPLANT
TOWEL NATURAL 6PK STERILE (DISPOSABLE) ×3 IMPLANT
TOWEL OR 17X24 6PK STRL BLUE (TOWEL DISPOSABLE) ×3 IMPLANT
TOWEL OR 17X26 10 PK STRL BLUE (TOWEL DISPOSABLE) ×3 IMPLANT
UNDERPAD 30X30 INCONTINENT (UNDERPADS AND DIAPERS) ×3 IMPLANT
WATER STERILE IRR 1000ML POUR (IV SOLUTION) ×3 IMPLANT

## 2013-11-23 NOTE — Interval H&P Note (Signed)
History and Physical Interval Note:  11/23/2013 7:05 AM  [facility]  has presented today for surgery, with the diagnosis of End Stage Renal Disease  The various methods of treatment have been discussed with the patient and family. After consideration of risks, benefits and other options for treatment, the patient has consented to  Procedure(s): INSERTION OF DIALYSIS CATHETER (N/A) ARTERIOVENOUS (AV) FISTULA CREATION VS. GRAFT (Left) as a surgical intervention .  The patient's history has been reviewed, patient examined, no change in status, stable for surgery.  I have reviewed the patient's chart and labs.  Questions were answered to the patient's satisfaction.     [name]

## 2013-11-23 NOTE — Progress Notes (Signed)
Hemodialysis- Pt tolerating procedure well. After approx 1.5 hours cath would not function. VP 400, cath flushed several times manually but could not achieve over 100BFR. Dr. Florene Glen paged and notified of issue. Treatment ended and pt sent back to room without event.

## 2013-11-23 NOTE — OR Nursing (Signed)
Diteck insertion procedure end at 0816.

## 2013-11-23 NOTE — Op Note (Signed)
    OPERATIVE REPORT  DATE OF SURGERY: 11/23/2013  PATIENT: [facility], 63 y.o. male MRN: [id number]  DOB: 1951-01-03  PRE-OPERATIVE DIAGNOSIS: End-stage renal disease  POST-OPERATIVE DIAGNOSIS:  Same  PROCEDURE: #1 placement of right IJ hemodialysis catheter, tunneled. #2 left Cimino radiocephalic fistula  SURGEON:  Curt Jews, M.D.  PHYSICIAN ASSISTANT: Nurse  ANESTHESIA:  Local with sedation  EBL: Minimal ml  Total I/O In: 200 [I.V.:200] Out: 30 [Blood:30]  BLOOD ADMINISTERED: None  DRAINS: None  SPECIMEN: None  COUNTS CORRECT:  YES  PLAN OF CARE: PACU with chest x-ray pending   PATIENT DISPOSITION:  PACU - hemodynamically stable  PROCEDURE DETAILS: The patient was taken to the operating placed supine position where the area the right and left neck and chest were prepped and draped in usual sterile fashion. The patient had a temporary right IJ hemodialysis catheter. This was prepped as well. The catheter was divided and a guidewire was passed through the catheter the catheter was removed in its entirety. Fluoroscopy was used to confirm placement in the right atrium. A dilator and peel-away sheath was passed over the guidewire and the dilator and guidewire removed. A 23 cm hemodialysis catheter was positioned at the level of the distal right atrium. The catheter was brought to separate stab incision and a 2 lm ports were attached. Both lumens flushed and aspirated easily and were locked with 1000 unit per cc heparin. The catheter secured to the skin with a 3-0 nylon stitch and the entry site was closed with a 4-0 subcuticular Vicryl stitch  Next the left arm was prepped and draped in the usual sterile fashion. Preoperative ultrasound suggested potentially small cephalic vein for fistula creation. She left the quite good caliber in the operating room this was imaged with SonoSite ultrasound and was patent throughout its course. Using local anesthesia incision was made  between the level of the cephalic vein and the radial artery at the wrist. The cephalic vein was ligated distally and divided and was gently dilated and was of excellent caliber. The radial artery was exposed through the same incision was also of good caliber with minimal atherosclerotic change. The artery was occluded proximally and distally and was opened with an 11 blade and sent longitudinally with Potts scissors. The vein was cut to appropriate length and sewn end-to-side to the artery with a running 6-0 Prolene suture. A 2 dilator passed proximally and distally to the artery the vein prior to completion of the anastomosis. Anastomosis completed and clamps removed with excellent thrill in the vein. There was a very good size. There were several dominant branches it could be seen under the skin and this was also imaged with ultrasound to determine the exact location. Several separate incisions were made over these in the branches were ligated with 3-0 silk ties. Wounds were irrigated with saline. Hemostasis the cautery. Wounds were closed with 3-0 Vicryl the subcutaneous and subcuticular tissue at the wrist and the small stab incisions were closed with 4 subcuticular Vicryl sutures. The patient was transferred to the recovery where chest x-ray is pending   Curt Jews, M.D. 11/23/2013 9:37 AM

## 2013-11-23 NOTE — Anesthesia Preprocedure Evaluation (Addendum)
Anesthesia Evaluation  Patient identified by MRN, date of birth, ID band Patient awake    Reviewed: Allergy & Precautions, H&P , NPO status , Patient's Chart, lab work & pertinent test results  History of Anesthesia Complications Negative for: history of anesthetic complications  Airway Mallampati: II TM Distance: >3 FB Neck ROM: Full    Dental  (+) Missing, Poor Dentition, Dental Advisory Given   Pulmonary former smoker,    Pulmonary exam normal       Cardiovascular hypertension, Pt. on medications Rate:Tachycardia     Neuro/Psych negative neurological ROS  negative psych ROS   GI/Hepatic negative GI ROS, Neg liver ROS,   Endo/Other    Renal/GU ESRF and DialysisRenal disease     Musculoskeletal   Abdominal   Peds  Hematology   Anesthesia Other Findings   Reproductive/Obstetrics                         Anesthesia Physical Anesthesia Plan  ASA: III  Anesthesia Plan: MAC   Post-op Pain Management:    Induction: Intravenous  Airway Management Planned:   Additional Equipment:   Intra-op Plan:   Post-operative Plan:   Informed Consent: I have reviewed the patients History and Physical, chart, labs and discussed the procedure including the risks, benefits and alternatives for the proposed anesthesia with the patient or authorized representative who has indicated his/her understanding and acceptance.   Dental advisory given  Plan Discussed with: CRNA, Anesthesiologist and Surgeon  Anesthesia Plan Comments:         Anesthesia Quick Evaluation

## 2013-11-23 NOTE — Anesthesia Postprocedure Evaluation (Signed)
Anesthesia Post Note  Patient: [facility]t  Procedure(s) Performed: Procedure(s) (LRB): INSERTION OF DIALYSIS CATHETER RIGHT INTERNAL JUGULAR VEIN (N/A) ARTERIOVENOUS (AV) FISTULA CREATION VS. GRAFT (Left)  Anesthesia type: MAC  Patient location: PACU  Post pain: Pain level controlled  Post assessment: Patient's Cardiovascular Status Stable  Post vital signs: Reviewed and stable  Level of consciousness: sedated  Complications: No apparent anesthesia complications

## 2013-11-23 NOTE — Procedures (Signed)
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  Will do again in AM.  WIll CLIP for OP HD but likely will go to [facility] as he lives a [place] of miles from there. [facility]t

## 2013-11-23 NOTE — Progress Notes (Signed)
Subjective:  Pt seen and examined in PM after HD catheter and AV fistula creation vs graft placed. Pt reports minimal pain at site. Reports appetite has improved since yesterday. No more loose stools or nausea. Denies dyspnea or CP.       Objective: Vital signs in last 24 hours: Filed Vitals:   11/22/13 0500 11/22/13 1708 11/22/13 2044 11/23/13 0500  BP: 169/85 163/78 167/87 166/94  Pulse: 108 100 105 113  Temp: 98.7 F (37.1 C) 98.7 F (37.1 C) 99.3 F (37.4 C) 98.9 F (37.2 C)  TempSrc: Oral Oral Oral Oral  Resp: 18 17 16 18   Height:      Weight:   68.9 kg (151 lb 14.4 oz)   SpO2: 95% 100% 98% 99%   Weight change: 0.3 kg (10.6 oz) No intake or output data in the 24 hours ending 11/23/13 0715  PHYSICAL EXAM:  General - Thin appearing. NAD Cardiac - Normal rate and rhythm  Respiratory - Clear to auscultation bilaterally.   Abdomen  -  Soft, nontender, normal bowel sounds   Neuro - Alert & Orientated x 3  Extremities - Trace edema  Skin - Warm, dry, no rash    Lab Results: Basic Metabolic Panel:  Recent Labs Lab 11/20/13 0755 11/21/13 0550 11/22/13 0344  NA 141 142 142  K 4.5 4.0 3.4*  CL 104 102 98  CO2 15* 18* 24  GLUCOSE 85 74 114*  BUN 100* 61* 39*  CREATININE 9.78* 6.97* 5.45*  CALCIUM 5.1* 6.3* 7.3*  MG 1.8  --  1.9  PHOS  --  6.0* 5.1*   Liver Function Tests:  Recent Labs Lab 11/19/13 2302 11/21/13 0550 11/22/13 0344  AST 14  --   --   ALT <5  --   --   ALKPHOS 67  --   --   BILITOT 0.3  --   --   PROT 6.7  --   --   ALBUMIN 3.5 3.4* 3.6   CBC:  Recent Labs Lab 11/21/13 0550 11/22/13 0344  WBC 5.2 4.3  NEUTROABS 2.9 3.7  HGB 8.7* 11.2*  HCT 25.8* 34.6*  MCV 87.2 89.2  PLT 121* 91*   Hemoglobin A1C:  Recent Labs Lab 11/19/13 2302  HGBA1C 5.2    Anemia Panel:  Recent Labs Lab 11/20/13 0755  VITAMINB12 332  FERRITIN 246  TIBC 172*  IRON 53  RETICCTPCT 0.7   Urinalysis:  Recent Labs Lab 11/19/13 2132    COLORURINE YELLOW  LABSPEC 1.007  PHURINE 6.0  GLUCOSEU NEGATIVE  HGBUR LARGE*  BILIRUBINUR NEGATIVE  KETONESUR NEGATIVE  PROTEINUR 100*  UROBILINOGEN 0.2  NITRITE NEGATIVE  LEUKOCYTESUR NEGATIVE    Studies/Results: No results found. Medications:  Prior to Admission:  Prescriptions prior to admission  Medication Sig Dispense Refill  . acetaminophen (TYLENOL) 500 MG tablet Take 1,000 mg by mouth every 6 (six) hours as needed for mild pain.      Marland Kitchen amLODipine-benazepril (LOTREL) 5-20 MG per capsule Take 1 capsule by mouth daily.       Scheduled Meds: . [MAR HOLD] amLODipine  10 mg Oral Daily  . [facility] HOLD] calcitRIOL  0.5 mcg Oral Daily  . [facility] HOLD] calcium carbonate  800 mg of elemental calcium Oral TID WC  . cefUROXime (ZINACEF)  IV  1.5 g Intravenous On Call to OR  . [MAR HOLD] heparin  5,000 Units Subcutaneous 3 times per day  . [facility] HOLD] sodium bicarbonate  1,300 mg  Oral TID   Continuous Infusions:  PRN Meds:.[MAR HOLD] sodium chloride, [MAR HOLD] sodium chloride, [MAR HOLD] acetaminophen, [MAR HOLD] feeding supplement (NEPRO CARB STEADY), [MAR HOLD] heparin, [MAR HOLD] labetalol, [MAR HOLD] lidocaine (PF), [MAR HOLD] lidocaine-prilocaine, [MAR HOLD] pentafluoroprop-tetrafluoroeth, [MAR HOLD] promethazine Assessment/Plan:  AKI on CKD, now ESRD requiring HD - Cr improving.   Pt with Cr on admission pt 9.83 with unknown baseline. Calculated FeNa 18.1% indicating intrinsic renal pathology. Pt most likely with undiagnosed CKD in setting of uncontrolled HTN. [place] with increased cortical echotexture both kidneys. UA with mild proteinuria, protein:Cr ratio not nephrotic range proteinuria. Workup including HbA1c (5.2), HIV (NR), and hepatitis panel (Neg).  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  -Appreciate nephrology following -HD per renal  -Vein mapping, left arm permanent access (fistula vs graft) today -F/U SPEP, kappa/lambda light chains,  ANA -Consider  RPR, cryoglobulins  -Renal diet with fluid restriction  -Clip process for outpatient HD and transportation (pt lives in [place])  Hypocalcemia and hyperphosphatemia  - Corrected Ca 6.9 Phos 7.2. Most likely due to secondary hyperparathyroidism from CKD. Vitamin D 25 OH levels low at 16-19. -Renal following   -F/U intact PTH, and 1,25 OH Vit D  -Consider phoslo with meals  -Tums 500 TID  -Calctriol 0.5 mcg daily   Normocytic Anemia - Hg currently stable.  Hg 7.2 on admission s/p 2 pRBCs on 5/1 due to Hg 6.1 . No active bleeding or symptoms. Anemia panel on 5/1 with reticulocytes (wnl) , ferritin (246), iron (53), TIBC (172 L), Iron sat (31%), vitamin B12 (332).   -Transfuse if Hg <7 -Monitor CBC   Anion Gap Metabolic Acidosis - Improving.  On admission, 28 in setting of renal failure.  -Sodium bicarbonate 1300 mg TID  Hypertension - Currently hypertensive . Pt recently started on amlodipine-benazepril 5-20 mg daily as outpatient.  -Amlodipine 10 mg daily  -Hold home ACEi  Diarrhea - Resolved. Pt with recent loose stools. No diet/medication changes or antibiotics. C. Diff toxin negative on 5/3.  -F/U GI pathogen panel  -D/c contact cautions  Diet: Renal DVT: SQ Heparin TID, SCD's  Code: Full   Dispo: Disposition is deferred at this time, awaiting improvement of current medical problems.   The patient does not have a current PCP (No primary provider on file.) and does need an [facility] hospital follow-up appointment after discharge.  The patient does not have transportation limitations that hinder transportation to clinic appointments.  .Services Needed at time of discharge: Y = Yes, Blank = No PT:   OT:   RN:   Equipment:   Other:     LOS: 4 days   Juluis Mire, MD 11/23/2013, 7:15 AM

## 2013-11-23 NOTE — H&P (View-Only) (Signed)
Vein map reviewed, poor quality cephalic vein bilaterally.  Basilic vein is good on both sides  Filed Vitals:   11/21/13 1600 11/21/13 1631 11/22/13 0112 11/22/13 0500  BP: 164/109 171/103  169/85  Pulse: 94 99  108  Temp: 98.1 F (36.7 C) 98.6 F (37 C)  98.7 F (37.1 C)  TempSrc: Oral Oral  Oral  Resp: 12 16  18   Height:      Weight: 147 lb 14.9 oz (67.1 kg)  147 lb 14.9 oz (67.1 kg)   SpO2: 93% 100%  95%    2+ radial brachial pulse bilaterally, pt with IV in left antecubital area,  Visible cephalic vein on skin surface  A: Needs hemodialysis access P: most likely left basilic vein fistula in OR tomorrow and diatek but has cephalic vein on skin surface that might be reasonable to explore in OR  Needs all IVs removed from left arm  NPO, consent, cefuroxime on call.  Procedure details risk benefits discussed with pt.  He agrees to proceed.  [name], MD [facility] [place] Office: [phone] Pager: [phone]

## 2013-11-23 NOTE — Addendum Note (Signed)
Addendum created 11/23/13 1132 by Antonietta Breach, CRNA   Modules edited: Anesthesia Flowsheet

## 2013-11-23 NOTE — Transfer of Care (Signed)
Immediate Anesthesia Transfer of Care Note  Patient: Andrew Brandt  Procedure(s) Performed: Procedure(s): INSERTION OF DIALYSIS CATHETER RIGHT INTERNAL JUGULAR VEIN (N/A) ARTERIOVENOUS (AV) FISTULA CREATION VS. GRAFT (Left)  Patient Location: PACU  Anesthesia Type:MAC  Level of Consciousness: awake, alert  and oriented  Airway & Oxygen Therapy: Patient Spontanous Breathing and Patient connected to face mask oxygen  Post-op Assessment: Report given to PACU RN and Post -op Vital signs reviewed and stable  Post vital signs: Reviewed and stable  Complications: No apparent anesthesia complications

## 2013-11-24 ENCOUNTER — Encounter (HOSPITAL_COMMUNITY): Payer: Self-pay | Admitting: Vascular Surgery

## 2013-11-24 ENCOUNTER — Other Ambulatory Visit: Payer: Self-pay | Admitting: *Deleted

## 2013-11-24 ENCOUNTER — Telehealth: Payer: Self-pay | Admitting: Vascular Surgery

## 2013-11-24 DIAGNOSIS — N186 End stage renal disease: Secondary | ICD-10-CM

## 2013-11-24 DIAGNOSIS — I120 Hypertensive chronic kidney disease with stage 5 chronic kidney disease or end stage renal disease: Secondary | ICD-10-CM

## 2013-11-24 DIAGNOSIS — N2581 Secondary hyperparathyroidism of renal origin: Secondary | ICD-10-CM

## 2013-11-24 DIAGNOSIS — D696 Thrombocytopenia, unspecified: Secondary | ICD-10-CM

## 2013-11-24 DIAGNOSIS — Z4931 Encounter for adequacy testing for hemodialysis: Secondary | ICD-10-CM

## 2013-11-24 DIAGNOSIS — Z992 Dependence on renal dialysis: Secondary | ICD-10-CM

## 2013-11-24 LAB — CBC WITH DIFFERENTIAL/PLATELET
Basophils Absolute: 0 10*3/uL (ref 0.0–0.1)
Basophils Relative: 0 % (ref 0–1)
Eosinophils Absolute: 0.2 10*3/uL (ref 0.0–0.7)
Eosinophils Relative: 3 % (ref 0–5)
HCT: 24.6 % — ABNORMAL LOW (ref 39.0–52.0)
Hemoglobin: 8 g/dL — ABNORMAL LOW (ref 13.0–17.0)
Lymphocytes Relative: 20 % (ref 12–46)
Lymphs Abs: 1.1 10*3/uL (ref 0.7–4.0)
MCH: 29.6 pg (ref 26.0–34.0)
MCHC: 32.5 g/dL (ref 30.0–36.0)
MCV: 91.1 fL (ref 78.0–100.0)
Monocytes Absolute: 0.3 10*3/uL (ref 0.1–1.0)
Monocytes Relative: 6 % (ref 3–12)
Neutro Abs: 3.8 10*3/uL (ref 1.7–7.7)
Neutrophils Relative %: 71 % (ref 43–77)
Platelets: 104 10*3/uL — ABNORMAL LOW (ref 150–400)
RBC: 2.7 MIL/uL — ABNORMAL LOW (ref 4.22–5.81)
RDW: 15.2 % (ref 11.5–15.5)
WBC: 5.3 10*3/uL (ref 4.0–10.5)

## 2013-11-24 LAB — ANA: Anti Nuclear Antibody(ANA): NEGATIVE

## 2013-11-24 LAB — RENAL FUNCTION PANEL
Albumin: 3.1 g/dL — ABNORMAL LOW (ref 3.5–5.2)
BUN: 44 mg/dL — ABNORMAL HIGH (ref 6–23)
CO2: 25 mEq/L (ref 19–32)
Calcium: 7.1 mg/dL — ABNORMAL LOW (ref 8.4–10.5)
Chloride: 98 mEq/L (ref 96–112)
Creatinine, Ser: 6.34 mg/dL — ABNORMAL HIGH (ref 0.50–1.35)
GFR calc Af Amer: 10 mL/min — ABNORMAL LOW (ref >90)
GFR calc non Af Amer: 8 mL/min — ABNORMAL LOW (ref >90)
Glucose, Bld: 79 mg/dL (ref 70–99)
Phosphorus: 5.5 mg/dL — ABNORMAL HIGH (ref 2.3–4.6)
Potassium: 3.6 mEq/L — ABNORMAL LOW (ref 3.7–5.3)
Sodium: 140 mEq/L (ref 137–147)

## 2013-11-24 LAB — PROTEIN ELECTROPHORESIS, SERUM
Albumin ELP: 60.7 % (ref 55.8–66.1)
Alpha-1-Globulin: 4.7 % (ref 2.9–4.9)
Alpha-2-Globulin: 9.5 % (ref 7.1–11.8)
Beta 2: 3.4 % (ref 3.2–6.5)
Beta Globulin: 4 % — ABNORMAL LOW (ref 4.7–7.2)
Gamma Globulin: 17.7 % (ref 11.1–18.8)
M-Spike, %: NOT DETECTED g/dL
Total Protein ELP: 6.1 g/dL (ref 6.0–8.3)

## 2013-11-24 LAB — MAGNESIUM: Magnesium: 2 mg/dL (ref 1.5–2.5)

## 2013-11-24 MED ORDER — HEPARIN SODIUM (PORCINE) 1000 UNIT/ML DIALYSIS
20.0000 [IU]/kg | INTRAMUSCULAR | Status: DC | PRN
  Filled 2013-11-24: qty 2

## 2013-11-24 MED ORDER — LIDOCAINE-PRILOCAINE 2.5-2.5 % EX CREA: 1.0000 "application " | TOPICAL_CREAM | CUTANEOUS | Status: DC | PRN

## 2013-11-24 MED ORDER — SODIUM CHLORIDE 0.9 % IV SOLN: 100.0000 mL | INTRAVENOUS | Status: DC | PRN

## 2013-11-24 MED ORDER — LIDOCAINE HCL (PF) 1 % IJ SOLN: 5.0000 mL | INTRAMUSCULAR | Status: DC | PRN

## 2013-11-24 MED ORDER — HEPARIN SODIUM (PORCINE) 1000 UNIT/ML DIALYSIS: 1000.0000 [IU] | INTRAMUSCULAR | Status: DC | PRN

## 2013-11-24 MED ORDER — ALTEPLASE 2 MG IJ SOLR: 2.0000 mg | Freq: Once | INTRAMUSCULAR | Status: AC | PRN

## 2013-11-24 MED ORDER — HYDROCODONE-ACETAMINOPHEN 5-325 MG PO TABS
ORAL_TABLET | ORAL | Status: AC
  Administered 2013-11-24: 2 via ORAL
  Filled 2013-11-24: qty 2

## 2013-11-24 MED ORDER — NEPRO/CARBSTEADY PO LIQD: 237.0000 mL | ORAL | Status: DC | PRN

## 2013-11-24 MED ORDER — PENTAFLUOROPROP-TETRAFLUOROETH EX AERO: 1.0000 "application " | INHALATION_SPRAY | CUTANEOUS | Status: DC | PRN

## 2013-11-24 MED ORDER — HYDROCODONE-ACETAMINOPHEN 5-325 MG PO TABS
1.0000 | ORAL_TABLET | Freq: Four times a day (QID) | ORAL | Status: DC | PRN
  Administered 2013-11-24: 1 via ORAL
  Administered 2013-11-24: 2 via ORAL
  Administered 2013-11-25 (×2): 1 via ORAL
  Filled 2013-11-24 (×3): qty 1

## 2013-11-24 NOTE — Progress Notes (Signed)
UR completed 

## 2013-11-24 NOTE — Telephone Encounter (Addendum)
Message copied by Gena Fray on Tue Nov 24, 2013  2:13 PM ------      Message from: Peter Minium K      Created: Mon Nov 23, 2013  9:53 AM      Regarding: Schedule                   ----- Message -----         From: Rosetta Posner, MD         Sent: 11/23/2013   9:42 AM           To: Vvs Charge Pool            Head right IJ dialysis catheter placement, had Cimino fistula creation. [employer] information centre manager. I need to see him in the office in one month. ------  11/24/13: lm for pt re appt, dpm

## 2013-11-24 NOTE — Progress Notes (Addendum)
Subjective:  No acute events overnight. Pt reports he is feeling good today. He has minimal pain at AV fistula/graft site.      Objective: Vital signs in last 24 hours: Filed Vitals:   11/23/13 1856 11/23/13 2205 11/24/13 0513 11/24/13 1100  BP: 145/84 182/80 144/80 148/75  Pulse: 84 113 84 89  Temp: 98.5 F (36.9 C) 99.3 F (37.4 C) 98.6 F (37 C) 98.7 F (37.1 C)  TempSrc: Oral   Oral  Resp:  16 15 18   Height:      Weight:  63.73 kg (140 lb 8 oz)    SpO2: 100% 96% 96% 98%   Weight change: 1.4 kg (3 lb 1.4 oz)  Intake/Output Summary (Last 24 hours) at 11/24/13 1344 Last data filed at 11/24/13 1015  Gross per 24 hour  Intake    360 ml  Output      0 ml  Net    360 ml    PHYSICAL EXAM:  General:Thin appearing. NAD Cardiac: Normal rate and rhythm  Respiratory: Clear to auscultation bilaterally.   Abdomen: Soft, nontender, normal bowel sounds   Neuro: Alert & Orientated x 3  Extremities:  Left UE with c/d/i incision. No edema  Skin:  Warm, dry, no rash    Lab Results: Basic Metabolic Panel:  Recent Labs Lab 11/22/13 0344 11/23/13 0500 11/24/13 0630  NA 142 143  142 140  K 3.4* 3.4*  3.4* 3.6*  CL 98 98  97 98  CO2 24 24  23 25   GLUCOSE 114* 82  82 79  BUN 39* 53*  53* 44*  CREATININE 5.45* 7.09*  6.96* 6.34*  CALCIUM 7.3* 6.5*  6.4* 7.1*  MG 1.9  --  2.0  PHOS 5.1* 7.2* 5.5*   Liver Function Tests:  Recent Labs Lab 11/19/13 2302  11/23/13 0500 11/24/13 0630  AST 14  --   --   --   ALT <5  --   --   --   ALKPHOS 67  --   --   --   BILITOT 0.3  --   --   --   PROT 6.7  --   --   --   ALBUMIN 3.5  < > 3.4* 3.1*  < > = values in this interval not displayed. CBC:  Recent Labs Lab 11/22/13 0344 11/23/13 0500 11/24/13 0630  WBC 4.3 7.6 5.3  NEUTROABS 3.7  --  3.8  HGB 11.2* 8.7* 8.0*  HCT 34.6* 26.3* 24.6*  MCV 89.2 90.4 91.1  PLT 91* 110* 104*   Hemoglobin A1C:  Recent Labs Lab 11/19/13 2302  HGBA1C 5.2    Anemia  Panel:  Recent Labs Lab 11/20/13 0755  VITAMINB12 332  FERRITIN 246  TIBC 172*  IRON 53  RETICCTPCT 0.7   Urinalysis:  Recent Labs Lab 11/19/13 2132  COLORURINE YELLOW  LABSPEC 1.007  PHURINE 6.0  GLUCOSEU NEGATIVE  HGBUR LARGE*  BILIRUBINUR NEGATIVE  KETONESUR NEGATIVE  PROTEINUR 100*  UROBILINOGEN 0.2  NITRITE NEGATIVE  LEUKOCYTESUR NEGATIVE    Studies/Results: Dg Chest Port 1 View  11/23/2013   CLINICAL DATA:  Dialysis catheter placement  EXAM: PORTABLE CHEST - 1 VIEW  COMPARISON:  11/20/2013  FINDINGS: Temporary dialysis catheter is been removed and a new tunneled dialysis catheter placed via the right jugular vein. The distal lumen tip lies in the right atrium. No pneumothorax or edema is identified. Mild bibasilar atelectasis present. The heart size and mediastinal contours are stable.  IMPRESSION: Dialysis catheter tip in right atrium.  No pneumothorax or edema.   Electronically Signed   By: Aletta Edouard M.D.   On: 11/23/2013 10:34   Dg Fluoro Guide Cv Line-no Report  11/23/2013   CLINICAL DATA: insertion of right IJ dialysis catheter   FLOURO GUIDE CV LINE  Fluoroscopy was utilized by the requesting physician.  No radiographic  interpretation.    Medications:  Prior to Admission:  Prescriptions prior to admission  Medication Sig Dispense Refill  . acetaminophen (TYLENOL) 500 MG tablet Take 1,000 mg by mouth every 6 (six) hours as needed for mild pain.      Marland Kitchen amLODipine-benazepril (LOTREL) 5-20 MG per capsule Take 1 capsule by mouth daily.       Scheduled Meds: . amLODipine  10 mg Oral Daily  . calcitRIOL  0.5 mcg Oral Daily  . calcium carbonate  800 mg of elemental calcium Oral TID WC  . heparin  5,000 Units Subcutaneous 3 times per day   Continuous Infusions:  PRN Meds:.sodium chloride, sodium chloride, acetaminophen, feeding supplement (NEPRO CARB STEADY), heparin, labetalol, lidocaine (PF), lidocaine-prilocaine, pentafluoroprop-tetrafluoroeth,  promethazine Assessment/Plan:  AKI on CKD, now ESRD requiring HD - Cr improving.   Pt with Cr on admission pt 9.83 with unknown baseline. Calculated FeNa 18.1% indicating intrinsic renal pathology. Pt most likely with undiagnosed CKD in setting of uncontrolled HTN. [place] with increased cortical echotexture both kidneys. UA with mild proteinuria, protein:Cr ratio not nephrotic range proteinuria. Workup including HbA1c (5.2), HIV (NR), and hepatitis panel (Neg).  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  Pt is s/p left radio cephalic AVF and placement of right IJ diatek on 5/4.  -Appreciate nephrology following -HD per renal  -F/U SPEP, kappa/lambda light chains (elevated at 17/9.99),  ANA (Neg) -Consider RPR, cryoglobulins  -Renal diet with fluid restriction  -F/u with Dr. Donnetta Hutching in 4-6 weeks to check maturation of AVF -Clip process for outpatient HD and transportation (pt lives in [place])  Hypocalcemia and hyperphosphatemia in setting of secondary hyperparathyroidism- Corrected Ca 7.8 Phos 5.5. Most likely due to secondary hyperparathyroidism from CKD. PTH elevated at 1107 on 5/1, vitamin D 25 OH levels low at 16-19. -Renal following   -F/U 25 OH Vit D  -Consider phoslo with meals  -Tums 500 TID  -Calctriol 0.5 mcg daily   Normocytic Anemia - Hg currently stable.  Hg 7.2 on admission s/p 2 pRBCs on 5/1 due to Hg 6.1 . No active bleeding or symptoms. Anemia panel on 5/1 with reticulocytes (wnl) , ferritin (246), iron (53), TIBC (172 L), Iron sat (31%), vitamin B12 (332).   -Transfuse if Hg <7 -Monitor CBC   Thrombocytopenia - Pt with stable platelet count below admission of 172K. No active bleeding. -Monitor CBC  Anion Gap Metabolic Acidosis - Improving.  AG 17, on admission, 28 in setting of renal failure.  -Sodium bicarbonate 1300 mg TID  Hypertension - Currently hypertensive . Pt recently started on amlodipine-benazepril 5-20 mg daily as outpatient.  -Amlodipine 10  mg daily  -Hold home benazepril 20 mg daily  Diarrhea - Resolved. Pt with recent loose stools. No diet/medication changes or antibiotics. C. Diff toxin negative and GI pathogen panel negative on 5/3.  -D/c contact cautions  Diet: Renal DVT: SQ Heparin TID, SCD's  Code: Full   Dispo: Disposition is deferred at this time, awaiting improvement of current medical problems.   The patient does not have a current PCP (No primary provider on file.)  and does need an [facility] hospital follow-up appointment after discharge.  The patient does not have transportation limitations that hinder transportation to clinic appointments.  .Services Needed at time of discharge: Y = Yes, Blank = No PT:   OT:   RN:   Equipment:   Other:     LOS: 5 days   Juluis Mire, MD 11/24/2013, 1:44 PM

## 2013-11-24 NOTE — Progress Notes (Signed)
[facility] [place] Postoperative hemodialysis access   Date of Surgery:  11/23/13 Surgeon: Early  Subjective:  No complaints  PHYSICAL EXAMINATION:  Filed Vitals:   11/24/13 0513  BP: 144/80  Pulse: 84  Temp: 98.6 F (37 C)  Resp: 15    Incision is c/d/i with steri strips in tact Hand grip is strong  Sensation in digits is intact;  There is  Thrill  There is bruit. The graft/fistula is palpable    ASSESSMENT/PLAN:  Andrew Brandt is a 63 y.o. year old male who is s/p left radio cephalic AVF and placement of right IJ diatek.  -graft is patent -pt does not have evidence of steal sx -f/u with Dr. Donnetta Hutching in 4-6 weeks to check maturation of AVF -will sign off-call as needed.   Leontine Locket, PA-C [facility] [phone]

## 2013-11-24 NOTE — Procedures (Signed)
Better flow with catheter today with BFR of 250cc/min.  Stable hemodynamics. ACP

## 2013-11-25 DIAGNOSIS — D696 Thrombocytopenia, unspecified: Secondary | ICD-10-CM | POA: Diagnosis present

## 2013-11-25 DIAGNOSIS — K5900 Constipation, unspecified: Secondary | ICD-10-CM

## 2013-11-25 LAB — CBC
HCT: 25.9 % — ABNORMAL LOW (ref 39.0–52.0)
Hemoglobin: 8.3 g/dL — ABNORMAL LOW (ref 13.0–17.0)
MCH: 29.4 pg (ref 26.0–34.0)
MCHC: 32 g/dL (ref 30.0–36.0)
MCV: 91.8 fL (ref 78.0–100.0)
Platelets: 102 10*3/uL — ABNORMAL LOW (ref 150–400)
RBC: 2.82 MIL/uL — ABNORMAL LOW (ref 4.22–5.81)
RDW: 15 % (ref 11.5–15.5)
WBC: 4.8 10*3/uL (ref 4.0–10.5)

## 2013-11-25 LAB — RENAL FUNCTION PANEL
Albumin: 3.1 g/dL — ABNORMAL LOW (ref 3.5–5.2)
BUN: 28 mg/dL — ABNORMAL HIGH (ref 6–23)
CO2: 28 mEq/L (ref 19–32)
Calcium: 7.9 mg/dL — ABNORMAL LOW (ref 8.4–10.5)
Chloride: 100 mEq/L (ref 96–112)
Creatinine, Ser: 4.65 mg/dL — ABNORMAL HIGH (ref 0.50–1.35)
GFR calc Af Amer: 14 mL/min — ABNORMAL LOW (ref >90)
GFR calc non Af Amer: 12 mL/min — ABNORMAL LOW (ref >90)
Glucose, Bld: 79 mg/dL (ref 70–99)
Phosphorus: 3.9 mg/dL (ref 2.3–4.6)
Potassium: 3.8 mEq/L (ref 3.7–5.3)
Sodium: 141 mEq/L (ref 137–147)

## 2013-11-25 LAB — VITAMIN D 1,25 DIHYDROXY
Vitamin D 1, 25 (OH)2 Total: 34 pg/mL (ref 18–72)
Vitamin D2 1, 25 (OH)2: <8 pg/mL
Vitamin D3 1, 25 (OH)2: 34 pg/mL

## 2013-11-25 MED ORDER — LIDOCAINE-PRILOCAINE 2.5-2.5 % EX CREA: 1.0000 "application " | TOPICAL_CREAM | CUTANEOUS | Status: DC | PRN

## 2013-11-25 MED ORDER — SODIUM CHLORIDE 0.9 % IV SOLN: 100.0000 mL | INTRAVENOUS | Status: DC | PRN

## 2013-11-25 MED ORDER — HEPARIN SODIUM (PORCINE) 1000 UNIT/ML DIALYSIS
1000.0000 [IU] | INTRAMUSCULAR | Status: DC | PRN
Start: 2013-11-25 — End: 2013-11-26
  Filled 2013-11-25: qty 1

## 2013-11-25 MED ORDER — NEPRO/CARBSTEADY PO LIQD: 237.0000 mL | ORAL | Status: DC | PRN

## 2013-11-25 MED ORDER — LIDOCAINE HCL (PF) 1 % IJ SOLN: 5.0000 mL | INTRAMUSCULAR | Status: DC | PRN

## 2013-11-25 MED ORDER — PENTAFLUOROPROP-TETRAFLUOROETH EX AERO: 1.0000 "application " | INHALATION_SPRAY | CUTANEOUS | Status: DC | PRN

## 2013-11-25 MED ORDER — SODIUM CHLORIDE 0.9 % IV SOLN
25.0000 mg | Freq: Once | INTRAVENOUS | Status: AC
  Administered 2013-11-25: 25 mg via INTRAVENOUS
  Filled 2013-11-25 (×2): qty 2

## 2013-11-25 MED ORDER — SODIUM CHLORIDE 0.9 % IV SOLN
125.0000 mg | Freq: Every day | INTRAVENOUS | Status: DC
  Administered 2013-11-25 – 2013-11-26 (×2): 125 mg via INTRAVENOUS
  Filled 2013-11-25 (×4): qty 10

## 2013-11-25 MED ORDER — POLYETHYLENE GLYCOL 3350 17 G PO PACK
17.0000 g | PACK | Freq: Every day | ORAL | Status: DC
  Administered 2013-11-25 – 2013-11-26 (×2): 17 g via ORAL
  Filled 2013-11-25 (×3): qty 1

## 2013-11-25 MED ORDER — ALTEPLASE 2 MG IJ SOLR
2.0000 mg | Freq: Once | INTRAMUSCULAR | Status: AC | PRN
  Filled 2013-11-25: qty 2

## 2013-11-25 NOTE — Progress Notes (Signed)
63 y.o PMH HTN (no on medications) presents for likely acute on chronic CKD now ESRD requiring HD.  [place] ESRD  Still Awaiting OP slot, prob at [facility].  Anemia w/ low iron Will give IV iron       Thrombocytopenia.  Send HIT panel  Subjective: Interval History: Good HD treatment #2 yesterday  Objective: Vital signs in last 24 hours: Temp:  [98.1 F (36.7 C)-98.8 F (37.1 C)] 98.2 F (36.8 C) (05/06 0830) Pulse Rate:  [75-108] 81 (05/06 0830) Resp:  [16-20] 17 (05/06 0830) BP: (134-161)/(72-90) 152/83 mmHg (05/06 0830) SpO2:  [97 %-98 %] 97 % (05/06 0830) Weight:  [63.594 kg (140 lb 3.2 oz)-64.5 kg (142 lb 3.2 oz)] 63.594 kg (140 lb 3.2 oz) (05/05 1947) Weight change: -5.8 kg (-12 lb 12.6 oz)  Intake/Output from previous day: 05/05 0701 - 05/06 0700 In: 420 [P.O.:420] Out: 1000  Intake/Output this shift:    General appearance: alert and cooperative Extremities: extremities normal, atraumatic, no cyanosis or edema and LUE AVF good thrill and early arterialization R chest PC  Lab Results:  Recent Labs  11/24/13 0630 11/25/13 0641  WBC 5.3 4.8  HGB 8.0* 8.3*  HCT 24.6* 25.9*  PLT 104* 102*   BMET:  Recent Labs  11/24/13 0630 11/25/13 0641  NA 140 141  K 3.6* 3.8  CL 98 100  CO2 25 28  GLUCOSE 79 79  BUN 44* 28*  CREATININE 6.34* 4.65*  CALCIUM 7.1* 7.9*   No results found for this basename: PTH,  in the last 72 hours Iron Studies: No results found for this basename: IRON, TIBC, TRANSFERRIN, FERRITIN,  in the last 72 hours Studies/Results: No results found.  Scheduled: . amLODipine  10 mg Oral Daily  . calcitRIOL  0.5 mcg Oral Daily  . calcium carbonate  800 mg of elemental calcium Oral TID WC  . heparin  5,000 Units Subcutaneous 3 times per day  . polyethylene glycol  17 g Oral Daily     LOS: 6 days   Estanislado Emms 11/25/2013,10:06 AM

## 2013-11-25 NOTE — Progress Notes (Signed)
Subjective:  No acute events overnight. Pt reports he is feeling good today. Tolerated HD yesterday. His at AV fistula/graft site was relieved with norco yesterday. Reports still with constipation. Appetite improved. Patiently awaiting outpatient HD placement.       Objective: Vital signs in last 24 hours: Filed Vitals:   11/24/13 1845 11/24/13 1947 11/25/13 0520 11/25/13 0830  BP: 161/79 151/85 134/82 152/83  Pulse: 88 108 79 81  Temp: 98.1 F (36.7 C) 98.5 F (36.9 C) 98.2 F (36.8 C) 98.2 F (36.8 C)  TempSrc: Oral   Oral  Resp: 16 17 16 17   Height:      Weight:  63.594 kg (140 lb 3.2 oz)    SpO2: 98% 98% 98% 97%   Weight change: -5.8 kg (-12 lb 12.6 oz)  Intake/Output Summary (Last 24 hours) at 11/25/13 0844 Last data filed at 11/24/13 1947  Gross per 24 hour  Intake    420 ml  Output   1000 ml  Net   -580 ml    PHYSICAL EXAM:  General:Thin appearing. NAD Cardiac: Normal rate and rhythm  Respiratory: Clear to auscultation bilaterally.   Abdomen: Soft, nontender, normal bowel sounds   Neuro: Alert & Orientated x 3  Extremities:  Left UE with c/d/i incision. No edema  Skin:  Warm, dry, no rash    Lab Results: Basic Metabolic Panel:  Recent Labs Lab 11/22/13 0344  11/24/13 0630 11/25/13 0641  NA 142  < > 140 141  K 3.4*  < > 3.6* 3.8  CL 98  < > 98 100  CO2 24  < > 25 28  GLUCOSE 114*  < > 79 79  BUN 39*  < > 44* 28*  CREATININE 5.45*  < > 6.34* 4.65*  CALCIUM 7.3*  < > 7.1* 7.9*  MG 1.9  --  2.0  --   PHOS 5.1*  < > 5.5* 3.9  < > = values in this interval not displayed. Liver Function Tests:  Recent Labs Lab 11/19/13 2302  11/24/13 0630 11/25/13 0641  AST 14  --   --   --   ALT <5  --   --   --   ALKPHOS 67  --   --   --   BILITOT 0.3  --   --   --   PROT 6.7  --   --   --   ALBUMIN 3.5  < > 3.1* 3.1*  < > = values in this interval not displayed. CBC:  Recent Labs Lab 11/22/13 0344  11/24/13 0630 11/25/13 0641  WBC 4.3  < > 5.3  4.8  NEUTROABS 3.7  --  3.8  --   HGB 11.2*  < > 8.0* 8.3*  HCT 34.6*  < > 24.6* 25.9*  MCV 89.2  < > 91.1 91.8  PLT 91*  < > 104* 102*  < > = values in this interval not displayed. Hemoglobin A1C:  Recent Labs Lab 11/19/13 2302  HGBA1C 5.2    Anemia Panel:  Recent Labs Lab 11/20/13 0755  VITAMINB12 332  FERRITIN 246  TIBC 172*  IRON 53  RETICCTPCT 0.7   Urinalysis:  Recent Labs Lab 11/19/13 2132  COLORURINE YELLOW  LABSPEC 1.007  PHURINE 6.0  GLUCOSEU NEGATIVE  HGBUR LARGE*  BILIRUBINUR NEGATIVE  KETONESUR NEGATIVE  PROTEINUR 100*  UROBILINOGEN 0.2  NITRITE NEGATIVE  LEUKOCYTESUR NEGATIVE    Studies/Results: Dg Chest Port 1 View  11/23/2013   CLINICAL  DATA:  Dialysis catheter placement  EXAM: PORTABLE CHEST - 1 VIEW  COMPARISON:  11/20/2013  FINDINGS: Temporary dialysis catheter is been removed and a new tunneled dialysis catheter placed via the right jugular vein. The distal lumen tip lies in the right atrium. No pneumothorax or edema is identified. Mild bibasilar atelectasis present. The heart size and mediastinal contours are stable.  IMPRESSION: Dialysis catheter tip in right atrium.  No pneumothorax or edema.   Electronically Signed   By: Aletta Edouard M.D.   On: 11/23/2013 10:34   Medications:  Prior to Admission:  Prescriptions prior to admission  Medication Sig Dispense Refill  . acetaminophen (TYLENOL) 500 MG tablet Take 1,000 mg by mouth every 6 (six) hours as needed for mild pain.      Marland Kitchen amLODipine-benazepril (LOTREL) 5-20 MG per capsule Take 1 capsule by mouth daily.       Scheduled Meds: . amLODipine  10 mg Oral Daily  . calcitRIOL  0.5 mcg Oral Daily  . calcium carbonate  800 mg of elemental calcium Oral TID WC  . heparin  5,000 Units Subcutaneous 3 times per day   Continuous Infusions:  PRN Meds:.sodium chloride, sodium chloride, acetaminophen, feeding supplement (NEPRO CARB STEADY), heparin, heparin, HYDROcodone-acetaminophen, labetalol,  lidocaine (PF), lidocaine-prilocaine, pentafluoroprop-tetrafluoroeth, promethazine Assessment/Plan:  AKI on CKD, now ESRD requiring HD - Cr improving 4.65.  Pt with Cr on admission of 9.83 with unknown baseline. Calculated FeNa 18.1% indicating intrinsic renal pathology. Pt most likely with undiagnosed CKD in setting of uncontrolled HTN. [place] with increased cortical echotexture both kidneys. UA with mild proteinuria, protein:Cr ratio not nephrotic range proteinuria. Workup including HbA1c (5.2), HIV (NR), ANA (neg), hepatitis panel (Neg), kappa/lambda light chains (elevated at 17/9.99), and SPEP (wnl). 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  Pt is s/p left radio cephalic AVF and placement of right IJ diatek on 5/4.  -Appreciate nephrology following -HD per renal  -Consider RPR, cryoglobulins  -Renal diet with fluid restriction  -Norco 5-325 1-2 tabs PRN 6 hr pain  -F/u with Dr. Donnetta Hutching in 4-6 weeks to check maturation of AVF -Clip process for outpatient HD and transportation (pt lives in [place])  Hypocalcemia and hyperphosphatemia in setting of secondary hyperparathyroidism- Corrected Ca 8.6 Phos 3.9. Most likely due to secondary hyperparathyroidism from CKD. PTH elevated at 1107 on 5/1, vitamin D 25 OH levels low at 16-19 with normal Vitamin D 1-25 OH  (34).  -Renal following   -Consider phoslo with meals  -Tums 500 TID  -Calctriol 0.5 mcg daily   Normocytic Anemia - Hg currently stable. Pt is s/p 2 pRBCs on 5/1 due to Hg 6.1 . No active bleeding or symptoms. Anemia panel on 5/1 with reticulocytes (wnl) , ferritin (246), iron (53), TIBC (172 L), Iron sat (31%), vitamin B12 (332).   -Transfuse if Hg <7 -Monitor CBC   Thrombocytopenia - Pt with stable platelet count below admission of 172K. No active bleeding. -Monitor CBC  Anion Gap Metabolic Acidosis - Improving.  AG 13, on admission, 28 in setting of renal failure.  -Sodium bicarbonate 1300 mg  TID  Hypertension - Currently hypertensive . Pt recently started on amlodipine-benazepril 5-20 mg daily as outpatient.  -Amlodipine 10 mg daily  -Hold home benazepril 20 mg daily  Constipation - Pt reports no BM for past several days. -Miralax daily  Diarrhea - Resolved. Pt with recent loose stools. No diet/medication changes or antibiotics. C. Diff toxin negative and GI pathogen panel  negative on 5/3.    Diet: Renal DVT: SQ Heparin TID, SCD's  Code: Full   Dispo: Disposition is deferred at this time, awaiting improvement of current medical problems.   The patient does not have a current PCP (No primary provider on file.) and does need an [facility] hospital follow-up appointment after discharge.  The patient does not have transportation limitations that hinder transportation to clinic appointments.  .Services Needed at time of discharge: Y = Yes, Blank = No PT:   OT:   RN:   Equipment:   Other:     LOS: 6 days   Juluis Mire, MD 11/25/2013, 8:44 AM

## 2013-11-25 NOTE — Progress Notes (Signed)
Pt A&Ox4.  VSS. Denies pain.  Pt waiting to be clipped for outpt dialysis.

## 2013-11-26 LAB — RENAL FUNCTION PANEL
Albumin: 3 g/dL — ABNORMAL LOW (ref 3.5–5.2)
BUN: 40 mg/dL — ABNORMAL HIGH (ref 6–23)
CO2: 28 mEq/L (ref 19–32)
Calcium: 8 mg/dL — ABNORMAL LOW (ref 8.4–10.5)
Chloride: 97 mEq/L (ref 96–112)
Creatinine, Ser: 5.93 mg/dL — ABNORMAL HIGH (ref 0.50–1.35)
GFR calc Af Amer: 10 mL/min — ABNORMAL LOW (ref >90)
GFR calc non Af Amer: 9 mL/min — ABNORMAL LOW (ref >90)
Glucose, Bld: 82 mg/dL (ref 70–99)
Phosphorus: 3.9 mg/dL (ref 2.3–4.6)
Potassium: 3.9 mEq/L (ref 3.7–5.3)
Sodium: 137 mEq/L (ref 137–147)

## 2013-11-26 MED ORDER — CALCITRIOL 0.5 MCG PO CAPS: 0.5000 ug | ORAL_CAPSULE | Freq: Every day | ORAL | Status: DC

## 2013-11-26 MED ORDER — AMLODIPINE BESYLATE 10 MG PO TABS: 10.0000 mg | ORAL_TABLET | Freq: Every day | ORAL | Status: DC

## 2013-11-26 MED ORDER — CALCIUM CARBONATE ANTACID 500 MG PO CHEW: 800.0000 mg | CHEWABLE_TABLET | Freq: Three times a day (TID) | ORAL | Status: DC

## 2013-11-26 NOTE — Progress Notes (Addendum)
Subjective:  No acute events overnight. Pt seen and examined in AM in HD. Currently with no complaints. Awaiting outpatient HD placement at [facility].      Objective: Vital signs in last 24 hours: Filed Vitals:   11/26/13 0830 11/26/13 0900 11/26/13 0930 11/26/13 1000  BP: 126/75 126/81 111/73 118/68  Pulse: 84 103 99 85  Temp:      TempSrc:      Resp:      Height:      Weight:      SpO2:       Weight change: -0.52 kg (-1 lb 2.4 oz) No intake or output data in the 24 hours ending 11/26/13 1019  PHYSICAL EXAM:  General:Thin appearing. NAD Cardiac: Normal rate and rhythm  Respiratory: Clear to auscultation bilaterally   Abdomen: Soft, non-tender, non-distended,  normal bowel sounds   Neuro: Alert & Orientated x 3  Extremities: No edema  Skin:  Warm, dry, no rash    Lab Results: Basic Metabolic Panel:  Recent Labs Lab 11/22/13 0344  11/24/13 0630 11/25/13 0641 11/26/13 0613  NA 142  < > 140 141 137  K 3.4*  < > 3.6* 3.8 3.9  CL 98  < > 98 100 97  CO2 24  < > 25 28 28   GLUCOSE 114*  < > 79 79 82  BUN 39*  < > 44* 28* 40*  CREATININE 5.45*  < > 6.34* 4.65* 5.93*  CALCIUM 7.3*  < > 7.1* 7.9* 8.0*  MG 1.9  --  2.0  --   --   PHOS 5.1*  < > 5.5* 3.9 3.9  < > = values in this interval not displayed. Liver Function Tests:  Recent Labs Lab 11/19/13 2302  11/25/13 0641 11/26/13 0613  AST 14  --   --   --   ALT <5  --   --   --   ALKPHOS 67  --   --   --   BILITOT 0.3  --   --   --   PROT 6.7  --   --   --   ALBUMIN 3.5  < > 3.1* 3.0*  < > = values in this interval not displayed. CBC:  Recent Labs Lab 11/22/13 0344  11/24/13 0630 11/25/13 0641  WBC 4.3  < > 5.3 4.8  NEUTROABS 3.7  --  3.8  --   HGB 11.2*  < > 8.0* 8.3*  HCT 34.6*  < > 24.6* 25.9*  MCV 89.2  < > 91.1 91.8  PLT 91*  < > 104* 102*  < > = values in this interval not displayed. Hemoglobin A1C:  Recent Labs Lab 11/19/13 2302  HGBA1C 5.2    Anemia Panel:  Recent  Labs Lab 11/20/13 0755  VITAMINB12 332  FERRITIN 246  TIBC 172*  IRON 53  RETICCTPCT 0.7   Urinalysis:  Recent Labs Lab 11/19/13 2132  COLORURINE YELLOW  LABSPEC 1.007  PHURINE 6.0  GLUCOSEU NEGATIVE  HGBUR LARGE*  BILIRUBINUR NEGATIVE  KETONESUR NEGATIVE  PROTEINUR 100*  UROBILINOGEN 0.2  NITRITE NEGATIVE  LEUKOCYTESUR NEGATIVE    Studies/Results: No results found. Medications:  Prior to Admission:  Prescriptions prior to admission  Medication Sig Dispense Refill  . acetaminophen (TYLENOL) 500 MG tablet Take 1,000 mg by mouth every 6 (six) hours as needed for mild pain.      Marland Kitchen amLODipine-benazepril (LOTREL) 5-20 MG per capsule Take 1 capsule by mouth daily.  Scheduled Meds: . amLODipine  10 mg Oral Daily  . calcitRIOL  0.5 mcg Oral Daily  . calcium carbonate  800 mg of elemental calcium Oral TID WC  . ferric gluconate (FERRLECIT/NULECIT) IV  125 mg Intravenous Daily  . heparin  5,000 Units Subcutaneous 3 times per day  . polyethylene glycol  17 g Oral Daily   Continuous Infusions:  PRN Meds:.sodium chloride, sodium chloride, acetaminophen, feeding supplement (NEPRO CARB STEADY), heparin, heparin, HYDROcodone-acetaminophen, labetalol, lidocaine (PF), lidocaine-prilocaine, pentafluoroprop-tetrafluoroeth, promethazine Assessment/Plan:  AKI on CKD, now ESRD requiring HD - Cr improving 5.93.  Pt with Cr on admission of 9.83 with unknown baseline. Calculated FeNa 18.1% indicating intrinsic renal pathology. Pt most likely with undiagnosed CKD in setting of uncontrolled HTN. [place] with increased cortical echotexture both kidneys. UA with mild proteinuria, protein:Cr ratio not nephrotic range proteinuria. Workup including HbA1c (5.2), HIV (NR), ANA (neg), hepatitis panel (Neg), kappa/lambda light chains (elevated at 17/9.99), and SPEP (wnl). 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  Pt is s/p left radio cephalic AVF and placement of right IJ  diatek on 5/4.  -Appreciate nephrology following -HD per renal  -Consider RPR, cryoglobulins  -Renal diet with fluid restriction  -Norco 5-325 1-2 tabs PRN 6 hr pain  -F/u with Dr. Donnetta Hutching in 4-6 weeks to check maturation of AVF (office will call pt) -Clip process for outpatient HD [facility])  Thrombocytopenia - No active bleeding. Pt with platelet count below admission of 172K with low of 91K (30-50% drop in 3 days) with no signs of thrombosis. 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  -Monitor CBC -F/U HIT panel -Hold SQ heparin -SCD's  -Due to intermediate risk, hold off direct thrombin therapy for HIT  Normocytic Anemia - Hg currently stable. Pt is s/p 2 pRBCs on 5/1 due to Hg 6.1 . No active bleeding or symptoms. Anemia panel on 5/1 with reticulocytes (wnl) , ferritin (246), iron (53), TIBC (172 L), Iron sat (31%), vitamin B12 (332).   -Transfuse if Hg <7 -Monitor CBC  -IV iron per renal (started on 5/6)  Hypocalcemia and hyperphosphatemia in setting of secondary hyperparathyroidism- Corrected Ca 8.8 Phos 3.9. Most likely due to secondary hyperparathyroidism from CKD. PTH elevated at 1107 on 5/1, vitamin D 25 OH levels low at 16-19 with normal Vitamin D 1-25 OH  (34).  -Renal following   -Tums 500 TID  -Calctriol 0.5 mcg daily (CM for assistance)  Hypertension - Currently hypertensive . Pt recently started on amlodipine-benazepril 5-20 mg daily as outpatient.  -Amlodipine 10 mg daily  -Hold home benazepril 20 mg daily  Constipation - Pt reports no BM for past several days. -Miralax daily  Diarrhea - Resolved. Pt with recent loose stools. No diet/medication changes or antibiotics. C. Diff toxin negative and GI pathogen panel negative on 5/3.   Anion Gap Metabolic Acidosis - Resolved on 5/7.  AG 12, on admission, 28 in setting of renal failure.  -Sodium bicarbonate 1300 mg TID  Diet: Renal DVT: SCD's  Code: Full   Dispo: Disposition is deferred at  this time, awaiting improvement of current medical problems.   The patient does not have a current PCP (No primary provider on file.) and does need an [facility] hospital follow-up appointment after discharge.  The patient does not have transportation limitations that hinder transportation to clinic appointments.  .Services Needed at time of discharge: Y = Yes, Blank = No PT:   OT:  RN:   Equipment:   Other:     LOS: 7 days   Juluis Mire, MD 11/26/2013, 10:19 AM

## 2013-11-26 NOTE — Discharge Summary (Signed)
Name: Andrew Brandt MRN: [id number] DOB: [birth year] 63 y.o. PCP: Dr. Benito Mccreedy  Date of Admission: 11/19/2013  9:31 AM Date of Discharge: 11/26/2013 Attending Physician: Larey Dresser, MD  Discharge Diagnosis:  Primary:  ESRD  Thrombocytopenia Normocytic Anemia  Hypocalcemia and hyperphosphatemia in setting of secondary hyperparathyroidism Hypertension  Anion Gap Metabolic Acidosis  Diarrhea  DVT prophylaxis    Discharge Medications:   Medication List    STOP taking these medications       amLODipine-benazepril 5-20 MG per capsule  Commonly known as:  LOTREL      TAKE these medications       acetaminophen 500 MG tablet  Commonly known as:  TYLENOL  Take 1,000 mg by mouth every 6 (six) hours as needed for mild pain.     amLODipine 10 MG tablet  Commonly known as:  NORVASC  Take 1 tablet (10 mg total) by mouth daily.  Start taking on:  11/27/2013     calcitRIOL 0.5 MCG capsule  Commonly known as:  ROCALTROL  Take 1 capsule (0.5 mcg total) by mouth daily.     calcium carbonate 500 MG chewable tablet  Commonly known as:  TUMS - dosed in mg elemental calcium  Chew 4 tablets (800 mg of elemental calcium total) by mouth 3 (three) times daily with meals.        Disposition and follow-up:   [name] was discharged from [facility] in Good condition.  At the hospital follow up visit please address:  1.   Compliance with HD TTS schedule               Blood pressure - Discharged on amlodipine 10 mg daily, held home benazepril 20 mg daily - reevaluate on resuming         Needs screening  colonoscopy        Anemia, consider Epo                     Thrombocytopenia                Compliance with tums and calcitriol                 Low C3 levels, consider RPR, cryoglobulins to evaluate for immune-complex GN         F/U with Dr Donnetta Hutching for AV fistula maturation     2.  Labs / imaging needed at time of follow-up: Renal  function panel, CBC (Hg, Plt)  3.  Pending labs/ test needing follow-up: None  Follow-up Appointments:     Follow-up Information   Follow up with EARLY, TODD, MD In 4 weeks. (Office will call you to arrange your appt (sent))    Specialty:  Vascular Surgery   Contact information:   [place] [address] [phone]       Follow up with Benito Mccreedy, MD On 12/04/2013. (11:45AM )    Specialty:  Internal Medicine   Contact information:   [address] [address] [phone]       Follow up with [facility] On 11/27/2013. (to fill out paper, HD starting Saturday)       Discharge Instructions: Attend your appt with [facility] on Friday to fill out paperwork--your dialysis session starts this Saturday -Stop taking LOTREL daily -Start taking amlopdipine daily for your high blood pressure (starting tomm) -Start taking tums three times daily with meals -Start taking calctriol daily (starting tomm) -Please  attend your hospital follow-up appt -Dr. Luther Parody office will call you for follow-up appt for your fistula -Pleasure meeting you - hope you were satisfied with our care!   Future Appointments Provider Department Dept Phone   12/29/2013 8:30 AM Rosetta Posner, MD [facility] -[phone]      Consultations: Treatment Team:  Rexene Agent, MD  Procedures Performed:  Dg Chest 1 View  11/20/2013   CLINICAL DATA:  Catheter placement.  EXAM: CHEST - 1 VIEW  COMPARISON:  11/20/2013, 1120 hr  FINDINGS: There is no evidence of right-sided pneumothorax with previous findings consistent with skin fold. The right jugular temporary dialysis catheter shows stable positioning with the catheter tip in the lower SVC. No edema, consolidation or pleural fluid is identified. The heart size and mediastinal contours are within normal limits.  IMPRESSION: No pneumothorax. Temporary dialysis catheter shows stable positioning in the lower SVC.    Electronically Signed   By: Aletta Edouard M.D.   On: 11/20/2013 15:13   US Renal  11/19/2013   CLINICAL DATA:  Abnormal renal function studies in the setting of hypertension with history of questionable left renal mass  EXAM: RENAL/URINARY TRACT ULTRASOUND COMPLETE  COMPARISON:  None.  FINDINGS: Visualization of the kidneys is limited due to the patient's body habitus.  Right Kidney:  Length: 7.0 cm. The echotexture of the renal cortex is increased. There are multiple hypoechoic to anechoic structures which likely reflect cysts. In the upper pole there are two presumed cystic structures. One measures 1 cm in greatest dimension while the second measures 0.7 cm in greatest dimension. In the lower pole there is a probable cyst measuring 1 cm in greatest dimension.  Left Kidney:  Length: 8.3 mm. Echogenicity appears in increased. Multiple hypoechoic and anechoic structures are demonstrated. In the upper pole there is a structure measuring 1.6 x 1.2 x 1.3 cm. In the mid to upper pole there is a structure measuring 1.3 cm in greatest dimension. In the midpole laterally there is a structure measuring 1.5 cm in greatest dimension. In the lower pole there is structure measuring 1.2 cm in greatest dimension.  Bladder:  Appears normal for degree of bladder distention. Prominent perivesicle vascular structures are demonstrated.  IMPRESSION: 1. The study is limited due to the patient's body habitus. There is increased cortical echotexture both kidneys which may reflect medical renal disease. There is no hydronephrosis. There are multiple hypoechoic to anechoic structures most compatible with cysts. The urinary bladder is grossly normal. 2. A renal protocol MRI would be a useful next imaging step.   Electronically Signed   By: David  Martinique   On: 11/19/2013 20:04   Dg Chest Port 1 View  11/23/2013   CLINICAL DATA:  Dialysis catheter placement  EXAM: PORTABLE CHEST - 1 VIEW  COMPARISON:  11/20/2013  FINDINGS: Temporary  dialysis catheter is been removed and a new tunneled dialysis catheter placed via the right jugular vein. The distal lumen tip lies in the right atrium. No pneumothorax or edema is identified. Mild bibasilar atelectasis present. The heart size and mediastinal contours are stable.  IMPRESSION: Dialysis catheter tip in right atrium.  No pneumothorax or edema.   Electronically Signed   By: Aletta Edouard M.D.   On: 11/23/2013 10:34   Dg Chest Port 1 View  11/20/2013   CLINICAL DATA:  Catheter placement, history hypertension  EXAM: Portable exam 1118 hr compared to 11/19/2013  COMPARISON:  None.  FINDINGS: RIGHT jugular central  venous catheter with tip projecting over SVC.  Normal heart size, mediastinal contours, and pulmonary vascularity.  Minimal tortuosity thoracic aorta noted.  Lungs clear.  No pleural effusion segmental consolidation.  Linear density projects over the lateral margin of the RIGHT upper lobe, potentially a skin fold but unable to completely exclude pneumothorax.  IMPRESSION: RIGHT jugular catheter tip projects over SVC.  Suspect skin fold at RIGHT apex though difficult to completely exclude pneumothorax; followup upright PA chest radiograph with expiratory technique recommended to exclude pneumothorax.   Electronically Signed   By: Lavonia Dana M.D.   On: 11/20/2013 12:03   Dg Chest Portable 1 View  11/19/2013   CLINICAL DATA:  History of hypertension, former smoker, need dialysis  EXAM: PORTABLE CHEST - 1 VIEW  COMPARISON:  None.  FINDINGS: The heart size and mediastinal contours are within normal limits. Both lungs are clear. The visualized skeletal structures are unremarkable.  IMPRESSION: No active disease.   Electronically Signed   By: Skipper Cliche M.D.   On: 11/19/2013 10:36   Dg Fluoro Guide Cv Line-no Report  11/23/2013   CLINICAL DATA: insertion of right IJ dialysis catheter   FLOURO GUIDE CV LINE  Fluoroscopy was utilized by the requesting physician.  No radiographic   interpretation.     2D Echo: None  Cardiac Cath: None  Admission HPI: Original Author Rae Lips, MD  63 year old male with PMH of HTN, who went to his girlfriends PCP Dr. Vista Lawman, for just a routine check yesterday. Lab results came back and he was told to go to the Ed has he had abnormal results and was told he needs dialysis. Pt doesnt have a PCP, has not seen a doctor in many years but was told in the past that he has HTN. Yesterday Amlodipine- Benazepril was prescribed for pt, which he just started taking. Pt had some nausea but denies vomiting, but says he has 2 episodes of loose stools over the two days- Non bloody. Normal appetite been eating and drinking. Denies use of NSAIDs. The only medications he takes is Tylenol. No difficulty passing urine or dysuric symptoms, no abdominal or flank pain. No family hx of kidney disease. Pt does say that he has had some leg swelling over the past 2 weeks, but no shortness of breath.   Hospital Course by problem list:   ESRD - Pt with Cr on admission of 9.83 with unknown baseline. Calculated FeNa 18.1% indicating intrinsic renal pathology. Pt most likely with undiagnosed CKD in setting of uncontrolled HTN. [place] with increased cortical echo texture of both kidneys. UA with mild proteinuria and hematuria. Total urine protein (<1 g) not nephrotic range proteinuria. Workup included HbA1c (5.2), HIV (NR), ANA (neg), hepatitis panel (neg), kappa/lambda light chains (elevated at 170 however total urine protein normal), and SPEP (wnl). 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  Pt was followed by nephrology during hospitalization and received HD during hospitalization (5/1-3, 5/4, 5/7) which he tolerated well with improvement of Cr to 5.93. Pt remained oliguric during hospitalization.  Pt received renal diet with fluid restriction during hospitalization. Pt underwent left radio cephalic AVF and placement of right IJ diatek on  5/4. Pt to follow-up with  Dr. Donnetta Hutching on December 29, 2013 to check maturation of AVF. Pt then awaited outpatient HD site which was found on day of discharge at [facility] on TTS schedule . Pt was instructed to attend on Friday to sign  paperwork and to start outpatient HD on 11/28/13.   Thrombocytopenia -  Pt with platelet count below admission of 172K with low of 91K (30-50% drop in 3 days) with no signs of thrombosis. 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 HIT panel (resulted after pt discharged) was negative. Pt initially received SQ heparin during hospitalization that was later held. Etiology of thrombocytopenia unclear, possibly chronic in nature.   Normocytic Anemia - Pt received 2 pRBCs on 5/1 due to Hg 6.1 with improvement of Hg to 9.4. Pt with Hg on discharge of 8.3.  Pt with no active bleeding or hemodynamic instability. Anemia panel on 5/1 with reticulocytes (wnl) , ferritin (246), iron (53), TIBC (172 L), Iron sat (31%), vitamin B12 (332). Pt was started on IV iron on 5/6 and received for 2 days during hospitalization due to low iron reserves. Pt should have screening coloscopy if not already received.     Hypocalcemia and hyperphosphatemia in setting of secondary hyperparathyroidism- Pt with elevated calcium and phosphorous due to secondary hyperparathyroidism from ESRD. PTH elevated at 1107 on 5/1, vitamin D 25 OH levels low (16-19) with normal vitamin D 1-25 OH (34). Pt initially received IV calcium gluconate and then continued on tums 500 TID and calcitriol 0.5 mcg daily with normalization of calcium and phosphate levels. Pt instructed to continue taking tums and calcitriol on discharge.    Hypertension - Pt with blood pressure range of 116/66 - 183/87 during hospitalization. Pt recently started on amlodipine-benazepril 5-20 mg daily as outpatient. Pt received amlodipine 10 mg daily and instructed to  continue on discharge. Pt's home benazepril 20 mg daily was held during hospitalization and on discharge.  Anion Gap Metabolic Acidosis - Pt presented with AG 28 on admission in setting of ESRD with hyperphosphatemia. Pt initially received sodium bicarbonate 1300 mg TID that was later discontinued after acidosis and AG resolved.   Diarrhea -  Pt with recent loose stools with no diet/medication changes or antibiotics. C. Diff toxin negative and GI pathogen panel were negative. Pt's diarrhea resolved during hospitalization.    DVT prophylaxis - Pt initially received SQ heparin during hospitalization later held and instead SCD's placed due to thrombocytopenia. No reports of DVT during hospitalization and HIT panel was negative.    Discharge Vitals:   BP 141/70  Pulse 90  Temp(Src) 98.9 F (37.2 C) (Oral)  Resp 18  Ht 5\' 9"  (1.753 m)  Wt 62.9 kg (138 lb 10.7 oz)  BMI 20.47 kg/m2  SpO2 98%  Discharge Labs:  Results for orders placed during the hospital encounter of 11/19/13 (from the past 24 hour(s))  RENAL FUNCTION PANEL     Status: Abnormal   Collection Time    11/26/13  6:13 AM      Result Value Ref Range   Sodium 137  137 - 147 mEq/L   Potassium 3.9  3.7 - 5.3 mEq/L   Chloride 97  96 - 112 mEq/L   CO2 28  19 - 32 mEq/L   Glucose, Bld 82  70 - 99 mg/dL   BUN 40 (*) 6 - 23 mg/dL   Creatinine, Ser 5.93 (*) 0.50 - 1.35 mg/dL   Calcium 8.0 (*) 8.4 - 10.5 mg/dL   Phosphorus 3.9  2.3 - 4.6 mg/dL   Albumin 3.0 (*) 3.5 - 5.2 g/dL   GFR calc non Af Amer 9 (*) >90 mL/min   GFR calc Af Amer 10 (*) >  90 mL/min    Signed: Juluis Mire, MD 11/26/2013, 8:10 PM   Time Spent on Discharge: 45 minutes Services Ordered on Discharge: None Equipment Ordered on Discharge: None

## 2013-11-26 NOTE — Progress Notes (Signed)
Patient was discharged home with girlfriend. Patient was discharged with [facility] letter and prescriptions. Patient was given education on hemodialysis, center, diet, catheter, fistula. Patient was told to call with questions and concerns. Patient was discharged with belongings and was stable upon discharge.

## 2013-11-26 NOTE — Discharge Instructions (Signed)
° ° °  11/24/2013 LILY DUTKO [id number] [birth year]  Surgeon(s): Rosetta Posner, MD  Procedure(s): INSERTION OF DIALYSIS CATHETER RIGHT INTERNAL JUGULAR VEIN ARTERIOVENOUS (AV) FISTULA CREATION VS. GRAFT  x Do not stick fistula for 12 weeks     -Attend your appt with [facility] on Friday to fill out paperwork--your dialysis session starts this Saturday -Stop taking LOTREL daily -Start taking amlopdipine daily for your high blood pressure (starting tomm) -Start taking tums three times daily with meals -Start taking calctriol daily (starting tomm) -Please attend your hospital follow-up appt -Dr. Luther Parody office will call you for follow-up appt for your fistula -Pleasure meeting you - hope you were satisfied with our care!

## 2013-11-26 NOTE — Procedures (Signed)
Tolerating HD via a PC, AVF looks good.  No hemodynamic issues. Awaiting placement in a dialysis unit.  Riley Kill, MD

## 2013-11-27 LAB — HEPARIN INDUCED THROMBOCYTOPENIA PNL
Heparin Induced Plt Ab: NEGATIVE
Patient O.D.: 0.039
UFH High Dose UFH H: 0 % Release
UFH Low Dose 0.1 IU/mL: 1 % Release
UFH Low Dose 0.5 IU/mL: 1 % Release
UFH SRA Result: NEGATIVE

## 2013-12-29 ENCOUNTER — Encounter: Payer: Self-pay | Admitting: Vascular Surgery

## 2014-01-04 ENCOUNTER — Encounter: Payer: Self-pay | Admitting: Vascular Surgery

## 2014-01-05 ENCOUNTER — Encounter: Payer: Self-pay | Admitting: Vascular Surgery

## 2014-01-05 ENCOUNTER — Ambulatory Visit (INDEPENDENT_AMBULATORY_CARE_PROVIDER_SITE_OTHER): Payer: Self-pay | Admitting: Vascular Surgery

## 2014-01-05 VITALS — BP 125/68 | HR 84 | Ht 69.0 in | Wt 142.4 lb

## 2014-01-05 DIAGNOSIS — N186 End stage renal disease: Secondary | ICD-10-CM

## 2014-01-05 NOTE — Progress Notes (Signed)
  [facility] POST OPERATIVE OFFICE NOTE  CC:  F/u for surgery  HPI:  This is a 63 y.o. male who is s/p placement of right IJ hemodialysis catheter and a left radiocephalic AVF on [id number].  Pt presents today for follow up.  He is not having any issues with his diatek catheter.  He denies any symptoms of steal.  No Known Allergies  Current Outpatient Prescriptions  Medication Sig Dispense Refill  . acetaminophen (TYLENOL) 500 MG tablet Take 1,000 mg by mouth every 6 (six) hours as needed for mild pain.      Marland Kitchen amLODipine (NORVASC) 10 MG tablet Take 1 tablet (10 mg total) by mouth daily.  30 tablet  0  . calcitRIOL (ROCALTROL) 0.5 MCG capsule Take 1 capsule (0.5 mcg total) by mouth daily.  30 capsule  0  . calcium carbonate (TUMS - DOSED IN MG ELEMENTAL CALCIUM) 500 MG chewable tablet Chew 4 tablets (800 mg of elemental calcium total) by mouth 3 (three) times daily with meals.  90 tablet  0   No current facility-administered medications for this visit.     ROS:  See HPI  Physical Exam:  Filed Vitals:   01/05/14 1502  BP: 125/68  Pulse: 84    Incision:  Well healed Extremities:  +thrill/bruit within fistula, developing nicely; grip is strong   A/P:  This is a 63 y.o. male here for f/u to diatek catheter placement and left radiocephalic AVF [id number]  -Pt is doing well with no steal sx -his fistula is developing nicely. -may stick fistula 02/22/14 and after 3 successful dialysis sessions, may remove diatek catheter.  -follow up as needed.  Leontine Locket, PA-C [facility] [phone]  Clinic MD:  Pt seen and examined with Dr. Donnetta Hutching  I have examined the patient, reviewed and agree with above. Very good Fantasha Daniele development of his left Cimino AV fistula. Right IJ dietetic catheter continues to function well as well. Should be able to use his fistula in Adonijah Baena August  Heavenleigh Petruzzi, MD 01/05/2014 3:33 PM

## 2014-02-20 DIAGNOSIS — D509 Iron deficiency anemia, unspecified: Secondary | ICD-10-CM | POA: Diagnosis not present

## 2014-02-20 DIAGNOSIS — N186 End stage renal disease: Secondary | ICD-10-CM | POA: Diagnosis not present

## 2014-02-20 DIAGNOSIS — D631 Anemia in chronic kidney disease: Secondary | ICD-10-CM | POA: Diagnosis not present

## 2014-02-20 DIAGNOSIS — N2581 Secondary hyperparathyroidism of renal origin: Secondary | ICD-10-CM | POA: Diagnosis not present

## 2014-03-22 DIAGNOSIS — Z452 Encounter for adjustment and management of vascular access device: Secondary | ICD-10-CM | POA: Diagnosis not present

## 2014-03-22 DIAGNOSIS — N186 End stage renal disease: Secondary | ICD-10-CM | POA: Diagnosis not present

## 2014-03-23 DIAGNOSIS — N186 End stage renal disease: Secondary | ICD-10-CM | POA: Diagnosis not present

## 2014-03-23 DIAGNOSIS — D631 Anemia in chronic kidney disease: Secondary | ICD-10-CM | POA: Diagnosis not present

## 2014-03-23 DIAGNOSIS — N2581 Secondary hyperparathyroidism of renal origin: Secondary | ICD-10-CM | POA: Diagnosis not present

## 2014-03-23 DIAGNOSIS — D509 Iron deficiency anemia, unspecified: Secondary | ICD-10-CM | POA: Diagnosis not present

## 2014-04-07 DIAGNOSIS — I871 Compression of vein: Secondary | ICD-10-CM | POA: Diagnosis not present

## 2014-04-07 DIAGNOSIS — T82898A Other specified complication of vascular prosthetic devices, implants and grafts, initial encounter: Secondary | ICD-10-CM | POA: Diagnosis not present

## 2014-04-07 DIAGNOSIS — N186 End stage renal disease: Secondary | ICD-10-CM | POA: Diagnosis not present

## 2014-04-21 DIAGNOSIS — N186 End stage renal disease: Secondary | ICD-10-CM | POA: Diagnosis not present

## 2014-04-22 DIAGNOSIS — D509 Iron deficiency anemia, unspecified: Secondary | ICD-10-CM | POA: Diagnosis not present

## 2014-04-22 DIAGNOSIS — Z23 Encounter for immunization: Secondary | ICD-10-CM | POA: Diagnosis not present

## 2014-04-22 DIAGNOSIS — D631 Anemia in chronic kidney disease: Secondary | ICD-10-CM | POA: Diagnosis not present

## 2014-04-22 DIAGNOSIS — N2581 Secondary hyperparathyroidism of renal origin: Secondary | ICD-10-CM | POA: Diagnosis not present

## 2014-04-22 DIAGNOSIS — N186 End stage renal disease: Secondary | ICD-10-CM | POA: Diagnosis not present

## 2014-05-22 DIAGNOSIS — N186 End stage renal disease: Secondary | ICD-10-CM | POA: Diagnosis not present

## 2014-05-22 DIAGNOSIS — Z992 Dependence on renal dialysis: Secondary | ICD-10-CM | POA: Diagnosis not present

## 2014-05-25 DIAGNOSIS — D631 Anemia in chronic kidney disease: Secondary | ICD-10-CM | POA: Diagnosis not present

## 2014-05-25 DIAGNOSIS — N2581 Secondary hyperparathyroidism of renal origin: Secondary | ICD-10-CM | POA: Diagnosis not present

## 2014-05-25 DIAGNOSIS — D509 Iron deficiency anemia, unspecified: Secondary | ICD-10-CM | POA: Diagnosis not present

## 2014-05-25 DIAGNOSIS — N186 End stage renal disease: Secondary | ICD-10-CM | POA: Diagnosis not present

## 2014-06-21 DIAGNOSIS — N186 End stage renal disease: Secondary | ICD-10-CM | POA: Diagnosis not present

## 2014-06-21 DIAGNOSIS — Z992 Dependence on renal dialysis: Secondary | ICD-10-CM | POA: Diagnosis not present

## 2014-06-22 DIAGNOSIS — D509 Iron deficiency anemia, unspecified: Secondary | ICD-10-CM | POA: Diagnosis not present

## 2014-06-22 DIAGNOSIS — Z23 Encounter for immunization: Secondary | ICD-10-CM | POA: Diagnosis not present

## 2014-06-22 DIAGNOSIS — N2581 Secondary hyperparathyroidism of renal origin: Secondary | ICD-10-CM | POA: Diagnosis not present

## 2014-06-22 DIAGNOSIS — D631 Anemia in chronic kidney disease: Secondary | ICD-10-CM | POA: Diagnosis not present

## 2014-06-22 DIAGNOSIS — N186 End stage renal disease: Secondary | ICD-10-CM | POA: Diagnosis not present

## 2014-07-12 DIAGNOSIS — N184 Chronic kidney disease, stage 4 (severe): Secondary | ICD-10-CM | POA: Diagnosis not present

## 2014-07-12 DIAGNOSIS — R7309 Other abnormal glucose: Secondary | ICD-10-CM | POA: Diagnosis not present

## 2014-07-12 DIAGNOSIS — R7301 Impaired fasting glucose: Secondary | ICD-10-CM | POA: Diagnosis not present

## 2014-07-12 DIAGNOSIS — N529 Male erectile dysfunction, unspecified: Secondary | ICD-10-CM | POA: Diagnosis not present

## 2014-07-12 DIAGNOSIS — I10 Essential (primary) hypertension: Secondary | ICD-10-CM | POA: Diagnosis not present

## 2014-07-12 DIAGNOSIS — R972 Elevated prostate specific antigen [PSA]: Secondary | ICD-10-CM | POA: Diagnosis not present

## 2014-07-12 DIAGNOSIS — R636 Underweight: Secondary | ICD-10-CM | POA: Diagnosis not present

## 2014-07-22 DIAGNOSIS — N186 End stage renal disease: Secondary | ICD-10-CM | POA: Diagnosis not present

## 2014-07-22 DIAGNOSIS — Z992 Dependence on renal dialysis: Secondary | ICD-10-CM | POA: Diagnosis not present

## 2014-07-25 DIAGNOSIS — N186 End stage renal disease: Secondary | ICD-10-CM | POA: Diagnosis not present

## 2014-07-25 DIAGNOSIS — D631 Anemia in chronic kidney disease: Secondary | ICD-10-CM | POA: Diagnosis not present

## 2014-07-25 DIAGNOSIS — D509 Iron deficiency anemia, unspecified: Secondary | ICD-10-CM | POA: Diagnosis not present

## 2014-07-25 DIAGNOSIS — N2581 Secondary hyperparathyroidism of renal origin: Secondary | ICD-10-CM | POA: Diagnosis not present

## 2014-08-16 DIAGNOSIS — I10 Essential (primary) hypertension: Secondary | ICD-10-CM | POA: Diagnosis not present

## 2014-08-16 DIAGNOSIS — Z1211 Encounter for screening for malignant neoplasm of colon: Secondary | ICD-10-CM | POA: Diagnosis not present

## 2014-08-16 DIAGNOSIS — N289 Disorder of kidney and ureter, unspecified: Secondary | ICD-10-CM | POA: Diagnosis not present

## 2014-08-22 DIAGNOSIS — N186 End stage renal disease: Secondary | ICD-10-CM | POA: Diagnosis not present

## 2014-08-22 DIAGNOSIS — Z992 Dependence on renal dialysis: Secondary | ICD-10-CM | POA: Diagnosis not present

## 2014-08-24 DIAGNOSIS — D509 Iron deficiency anemia, unspecified: Secondary | ICD-10-CM | POA: Diagnosis not present

## 2014-08-24 DIAGNOSIS — D631 Anemia in chronic kidney disease: Secondary | ICD-10-CM | POA: Diagnosis not present

## 2014-08-24 DIAGNOSIS — N186 End stage renal disease: Secondary | ICD-10-CM | POA: Diagnosis not present

## 2014-08-24 DIAGNOSIS — N2581 Secondary hyperparathyroidism of renal origin: Secondary | ICD-10-CM | POA: Diagnosis not present

## 2014-08-26 DIAGNOSIS — D638 Anemia in other chronic diseases classified elsewhere: Secondary | ICD-10-CM | POA: Diagnosis not present

## 2014-08-26 DIAGNOSIS — R972 Elevated prostate specific antigen [PSA]: Secondary | ICD-10-CM | POA: Diagnosis not present

## 2014-08-26 DIAGNOSIS — N2581 Secondary hyperparathyroidism of renal origin: Secondary | ICD-10-CM | POA: Diagnosis not present

## 2014-08-26 DIAGNOSIS — D631 Anemia in chronic kidney disease: Secondary | ICD-10-CM | POA: Diagnosis not present

## 2014-08-26 DIAGNOSIS — I10 Essential (primary) hypertension: Secondary | ICD-10-CM | POA: Diagnosis not present

## 2014-08-26 DIAGNOSIS — R636 Underweight: Secondary | ICD-10-CM | POA: Diagnosis not present

## 2014-08-26 DIAGNOSIS — N186 End stage renal disease: Secondary | ICD-10-CM | POA: Diagnosis not present

## 2014-08-26 DIAGNOSIS — N529 Male erectile dysfunction, unspecified: Secondary | ICD-10-CM | POA: Diagnosis not present

## 2014-08-26 DIAGNOSIS — R7309 Other abnormal glucose: Secondary | ICD-10-CM | POA: Diagnosis not present

## 2014-08-26 DIAGNOSIS — N184 Chronic kidney disease, stage 4 (severe): Secondary | ICD-10-CM | POA: Diagnosis not present

## 2014-08-26 DIAGNOSIS — D509 Iron deficiency anemia, unspecified: Secondary | ICD-10-CM | POA: Diagnosis not present

## 2014-08-27 DIAGNOSIS — I10 Essential (primary) hypertension: Secondary | ICD-10-CM | POA: Diagnosis not present

## 2014-08-28 DIAGNOSIS — D509 Iron deficiency anemia, unspecified: Secondary | ICD-10-CM | POA: Diagnosis not present

## 2014-08-28 DIAGNOSIS — D631 Anemia in chronic kidney disease: Secondary | ICD-10-CM | POA: Diagnosis not present

## 2014-08-28 DIAGNOSIS — N186 End stage renal disease: Secondary | ICD-10-CM | POA: Diagnosis not present

## 2014-08-28 DIAGNOSIS — N2581 Secondary hyperparathyroidism of renal origin: Secondary | ICD-10-CM | POA: Diagnosis not present

## 2014-08-31 DIAGNOSIS — N2581 Secondary hyperparathyroidism of renal origin: Secondary | ICD-10-CM | POA: Diagnosis not present

## 2014-08-31 DIAGNOSIS — D509 Iron deficiency anemia, unspecified: Secondary | ICD-10-CM | POA: Diagnosis not present

## 2014-08-31 DIAGNOSIS — D631 Anemia in chronic kidney disease: Secondary | ICD-10-CM | POA: Diagnosis not present

## 2014-08-31 DIAGNOSIS — N186 End stage renal disease: Secondary | ICD-10-CM | POA: Diagnosis not present

## 2014-09-02 DIAGNOSIS — N186 End stage renal disease: Secondary | ICD-10-CM | POA: Diagnosis not present

## 2014-09-02 DIAGNOSIS — D631 Anemia in chronic kidney disease: Secondary | ICD-10-CM | POA: Diagnosis not present

## 2014-09-02 DIAGNOSIS — D509 Iron deficiency anemia, unspecified: Secondary | ICD-10-CM | POA: Diagnosis not present

## 2014-09-02 DIAGNOSIS — N2581 Secondary hyperparathyroidism of renal origin: Secondary | ICD-10-CM | POA: Diagnosis not present

## 2014-09-04 DIAGNOSIS — N186 End stage renal disease: Secondary | ICD-10-CM | POA: Diagnosis not present

## 2014-09-04 DIAGNOSIS — N2581 Secondary hyperparathyroidism of renal origin: Secondary | ICD-10-CM | POA: Diagnosis not present

## 2014-09-04 DIAGNOSIS — D509 Iron deficiency anemia, unspecified: Secondary | ICD-10-CM | POA: Diagnosis not present

## 2014-09-04 DIAGNOSIS — D631 Anemia in chronic kidney disease: Secondary | ICD-10-CM | POA: Diagnosis not present

## 2014-09-07 DIAGNOSIS — N2581 Secondary hyperparathyroidism of renal origin: Secondary | ICD-10-CM | POA: Diagnosis not present

## 2014-09-07 DIAGNOSIS — D631 Anemia in chronic kidney disease: Secondary | ICD-10-CM | POA: Diagnosis not present

## 2014-09-07 DIAGNOSIS — D509 Iron deficiency anemia, unspecified: Secondary | ICD-10-CM | POA: Diagnosis not present

## 2014-09-07 DIAGNOSIS — N186 End stage renal disease: Secondary | ICD-10-CM | POA: Diagnosis not present

## 2014-09-09 DIAGNOSIS — D509 Iron deficiency anemia, unspecified: Secondary | ICD-10-CM | POA: Diagnosis not present

## 2014-09-09 DIAGNOSIS — N186 End stage renal disease: Secondary | ICD-10-CM | POA: Diagnosis not present

## 2014-09-09 DIAGNOSIS — D631 Anemia in chronic kidney disease: Secondary | ICD-10-CM | POA: Diagnosis not present

## 2014-09-09 DIAGNOSIS — N2581 Secondary hyperparathyroidism of renal origin: Secondary | ICD-10-CM | POA: Diagnosis not present

## 2014-09-11 DIAGNOSIS — D631 Anemia in chronic kidney disease: Secondary | ICD-10-CM | POA: Diagnosis not present

## 2014-09-11 DIAGNOSIS — N186 End stage renal disease: Secondary | ICD-10-CM | POA: Diagnosis not present

## 2014-09-11 DIAGNOSIS — D509 Iron deficiency anemia, unspecified: Secondary | ICD-10-CM | POA: Diagnosis not present

## 2014-09-11 DIAGNOSIS — N2581 Secondary hyperparathyroidism of renal origin: Secondary | ICD-10-CM | POA: Diagnosis not present

## 2014-09-13 DIAGNOSIS — N186 End stage renal disease: Secondary | ICD-10-CM | POA: Diagnosis not present

## 2014-09-13 DIAGNOSIS — I871 Compression of vein: Secondary | ICD-10-CM | POA: Diagnosis not present

## 2014-09-13 DIAGNOSIS — T82858D Stenosis of vascular prosthetic devices, implants and grafts, subsequent encounter: Secondary | ICD-10-CM | POA: Diagnosis not present

## 2014-09-13 DIAGNOSIS — Z992 Dependence on renal dialysis: Secondary | ICD-10-CM | POA: Diagnosis not present

## 2014-09-14 DIAGNOSIS — N2581 Secondary hyperparathyroidism of renal origin: Secondary | ICD-10-CM | POA: Diagnosis not present

## 2014-09-14 DIAGNOSIS — N186 End stage renal disease: Secondary | ICD-10-CM | POA: Diagnosis not present

## 2014-09-14 DIAGNOSIS — D509 Iron deficiency anemia, unspecified: Secondary | ICD-10-CM | POA: Diagnosis not present

## 2014-09-14 DIAGNOSIS — D631 Anemia in chronic kidney disease: Secondary | ICD-10-CM | POA: Diagnosis not present

## 2014-09-15 DIAGNOSIS — R7309 Other abnormal glucose: Secondary | ICD-10-CM | POA: Diagnosis not present

## 2014-09-15 DIAGNOSIS — N184 Chronic kidney disease, stage 4 (severe): Secondary | ICD-10-CM | POA: Diagnosis not present

## 2014-09-15 DIAGNOSIS — R636 Underweight: Secondary | ICD-10-CM | POA: Diagnosis not present

## 2014-09-15 DIAGNOSIS — I739 Peripheral vascular disease, unspecified: Secondary | ICD-10-CM | POA: Diagnosis not present

## 2014-09-15 DIAGNOSIS — R972 Elevated prostate specific antigen [PSA]: Secondary | ICD-10-CM | POA: Diagnosis not present

## 2014-09-15 DIAGNOSIS — N529 Male erectile dysfunction, unspecified: Secondary | ICD-10-CM | POA: Diagnosis not present

## 2014-09-15 DIAGNOSIS — I10 Essential (primary) hypertension: Secondary | ICD-10-CM | POA: Diagnosis not present

## 2014-09-15 DIAGNOSIS — D638 Anemia in other chronic diseases classified elsewhere: Secondary | ICD-10-CM | POA: Diagnosis not present

## 2014-09-16 DIAGNOSIS — D631 Anemia in chronic kidney disease: Secondary | ICD-10-CM | POA: Diagnosis not present

## 2014-09-16 DIAGNOSIS — N186 End stage renal disease: Secondary | ICD-10-CM | POA: Diagnosis not present

## 2014-09-16 DIAGNOSIS — N2581 Secondary hyperparathyroidism of renal origin: Secondary | ICD-10-CM | POA: Diagnosis not present

## 2014-09-16 DIAGNOSIS — D509 Iron deficiency anemia, unspecified: Secondary | ICD-10-CM | POA: Diagnosis not present

## 2014-09-18 DIAGNOSIS — D631 Anemia in chronic kidney disease: Secondary | ICD-10-CM | POA: Diagnosis not present

## 2014-09-18 DIAGNOSIS — N2581 Secondary hyperparathyroidism of renal origin: Secondary | ICD-10-CM | POA: Diagnosis not present

## 2014-09-18 DIAGNOSIS — N186 End stage renal disease: Secondary | ICD-10-CM | POA: Diagnosis not present

## 2014-09-18 DIAGNOSIS — D509 Iron deficiency anemia, unspecified: Secondary | ICD-10-CM | POA: Diagnosis not present

## 2014-09-20 DIAGNOSIS — Z992 Dependence on renal dialysis: Secondary | ICD-10-CM | POA: Diagnosis not present

## 2014-09-20 DIAGNOSIS — N186 End stage renal disease: Secondary | ICD-10-CM | POA: Diagnosis not present

## 2014-09-21 DIAGNOSIS — D509 Iron deficiency anemia, unspecified: Secondary | ICD-10-CM | POA: Diagnosis not present

## 2014-09-21 DIAGNOSIS — N2581 Secondary hyperparathyroidism of renal origin: Secondary | ICD-10-CM | POA: Diagnosis not present

## 2014-09-21 DIAGNOSIS — D631 Anemia in chronic kidney disease: Secondary | ICD-10-CM | POA: Diagnosis not present

## 2014-09-21 DIAGNOSIS — N186 End stage renal disease: Secondary | ICD-10-CM | POA: Diagnosis not present

## 2014-10-04 DIAGNOSIS — I739 Peripheral vascular disease, unspecified: Secondary | ICD-10-CM | POA: Diagnosis not present

## 2014-10-04 DIAGNOSIS — N400 Enlarged prostate without lower urinary tract symptoms: Secondary | ICD-10-CM | POA: Diagnosis not present

## 2014-10-04 DIAGNOSIS — N529 Male erectile dysfunction, unspecified: Secondary | ICD-10-CM | POA: Diagnosis not present

## 2014-10-04 DIAGNOSIS — R636 Underweight: Secondary | ICD-10-CM | POA: Diagnosis not present

## 2014-10-04 DIAGNOSIS — I10 Essential (primary) hypertension: Secondary | ICD-10-CM | POA: Diagnosis not present

## 2014-10-04 DIAGNOSIS — N184 Chronic kidney disease, stage 4 (severe): Secondary | ICD-10-CM | POA: Diagnosis not present

## 2014-10-04 DIAGNOSIS — R7309 Other abnormal glucose: Secondary | ICD-10-CM | POA: Diagnosis not present

## 2014-10-04 DIAGNOSIS — D638 Anemia in other chronic diseases classified elsewhere: Secondary | ICD-10-CM | POA: Diagnosis not present

## 2014-10-21 DIAGNOSIS — I120 Hypertensive chronic kidney disease with stage 5 chronic kidney disease or end stage renal disease: Secondary | ICD-10-CM | POA: Diagnosis not present

## 2014-10-21 DIAGNOSIS — N186 End stage renal disease: Secondary | ICD-10-CM | POA: Diagnosis not present

## 2014-10-21 DIAGNOSIS — Z992 Dependence on renal dialysis: Secondary | ICD-10-CM | POA: Diagnosis not present

## 2014-10-23 DIAGNOSIS — N186 End stage renal disease: Secondary | ICD-10-CM | POA: Diagnosis not present

## 2014-10-23 DIAGNOSIS — D631 Anemia in chronic kidney disease: Secondary | ICD-10-CM | POA: Diagnosis not present

## 2014-10-23 DIAGNOSIS — D509 Iron deficiency anemia, unspecified: Secondary | ICD-10-CM | POA: Diagnosis not present

## 2014-10-23 DIAGNOSIS — N2581 Secondary hyperparathyroidism of renal origin: Secondary | ICD-10-CM | POA: Diagnosis not present

## 2014-11-20 DIAGNOSIS — I120 Hypertensive chronic kidney disease with stage 5 chronic kidney disease or end stage renal disease: Secondary | ICD-10-CM | POA: Diagnosis not present

## 2014-11-20 DIAGNOSIS — N186 End stage renal disease: Secondary | ICD-10-CM | POA: Diagnosis not present

## 2014-11-20 DIAGNOSIS — Z992 Dependence on renal dialysis: Secondary | ICD-10-CM | POA: Diagnosis not present

## 2014-11-23 DIAGNOSIS — N2581 Secondary hyperparathyroidism of renal origin: Secondary | ICD-10-CM | POA: Diagnosis not present

## 2014-11-23 DIAGNOSIS — N186 End stage renal disease: Secondary | ICD-10-CM | POA: Diagnosis not present

## 2014-11-23 DIAGNOSIS — D631 Anemia in chronic kidney disease: Secondary | ICD-10-CM | POA: Diagnosis not present

## 2014-12-21 DIAGNOSIS — N186 End stage renal disease: Secondary | ICD-10-CM | POA: Diagnosis not present

## 2014-12-21 DIAGNOSIS — I120 Hypertensive chronic kidney disease with stage 5 chronic kidney disease or end stage renal disease: Secondary | ICD-10-CM | POA: Diagnosis not present

## 2014-12-21 DIAGNOSIS — Z992 Dependence on renal dialysis: Secondary | ICD-10-CM | POA: Diagnosis not present

## 2014-12-23 DIAGNOSIS — D509 Iron deficiency anemia, unspecified: Secondary | ICD-10-CM | POA: Diagnosis not present

## 2014-12-23 DIAGNOSIS — N186 End stage renal disease: Secondary | ICD-10-CM | POA: Diagnosis not present

## 2014-12-23 DIAGNOSIS — D631 Anemia in chronic kidney disease: Secondary | ICD-10-CM | POA: Diagnosis not present

## 2014-12-23 DIAGNOSIS — N2581 Secondary hyperparathyroidism of renal origin: Secondary | ICD-10-CM | POA: Diagnosis not present

## 2014-12-25 DIAGNOSIS — N186 End stage renal disease: Secondary | ICD-10-CM | POA: Diagnosis not present

## 2014-12-25 DIAGNOSIS — D509 Iron deficiency anemia, unspecified: Secondary | ICD-10-CM | POA: Diagnosis not present

## 2014-12-25 DIAGNOSIS — D631 Anemia in chronic kidney disease: Secondary | ICD-10-CM | POA: Diagnosis not present

## 2014-12-25 DIAGNOSIS — N2581 Secondary hyperparathyroidism of renal origin: Secondary | ICD-10-CM | POA: Diagnosis not present

## 2014-12-28 DIAGNOSIS — N186 End stage renal disease: Secondary | ICD-10-CM | POA: Diagnosis not present

## 2014-12-28 DIAGNOSIS — N2581 Secondary hyperparathyroidism of renal origin: Secondary | ICD-10-CM | POA: Diagnosis not present

## 2014-12-28 DIAGNOSIS — D509 Iron deficiency anemia, unspecified: Secondary | ICD-10-CM | POA: Diagnosis not present

## 2014-12-28 DIAGNOSIS — D631 Anemia in chronic kidney disease: Secondary | ICD-10-CM | POA: Diagnosis not present

## 2014-12-30 DIAGNOSIS — D631 Anemia in chronic kidney disease: Secondary | ICD-10-CM | POA: Diagnosis not present

## 2014-12-30 DIAGNOSIS — D509 Iron deficiency anemia, unspecified: Secondary | ICD-10-CM | POA: Diagnosis not present

## 2014-12-30 DIAGNOSIS — N186 End stage renal disease: Secondary | ICD-10-CM | POA: Diagnosis not present

## 2014-12-30 DIAGNOSIS — N2581 Secondary hyperparathyroidism of renal origin: Secondary | ICD-10-CM | POA: Diagnosis not present

## 2015-01-01 DIAGNOSIS — D631 Anemia in chronic kidney disease: Secondary | ICD-10-CM | POA: Diagnosis not present

## 2015-01-01 DIAGNOSIS — N2581 Secondary hyperparathyroidism of renal origin: Secondary | ICD-10-CM | POA: Diagnosis not present

## 2015-01-01 DIAGNOSIS — D509 Iron deficiency anemia, unspecified: Secondary | ICD-10-CM | POA: Diagnosis not present

## 2015-01-01 DIAGNOSIS — N186 End stage renal disease: Secondary | ICD-10-CM | POA: Diagnosis not present

## 2015-01-04 DIAGNOSIS — D631 Anemia in chronic kidney disease: Secondary | ICD-10-CM | POA: Diagnosis not present

## 2015-01-04 DIAGNOSIS — N186 End stage renal disease: Secondary | ICD-10-CM | POA: Diagnosis not present

## 2015-01-04 DIAGNOSIS — N2581 Secondary hyperparathyroidism of renal origin: Secondary | ICD-10-CM | POA: Diagnosis not present

## 2015-01-04 DIAGNOSIS — D509 Iron deficiency anemia, unspecified: Secondary | ICD-10-CM | POA: Diagnosis not present

## 2015-01-06 DIAGNOSIS — D509 Iron deficiency anemia, unspecified: Secondary | ICD-10-CM | POA: Diagnosis not present

## 2015-01-06 DIAGNOSIS — N2581 Secondary hyperparathyroidism of renal origin: Secondary | ICD-10-CM | POA: Diagnosis not present

## 2015-01-06 DIAGNOSIS — N186 End stage renal disease: Secondary | ICD-10-CM | POA: Diagnosis not present

## 2015-01-06 DIAGNOSIS — D631 Anemia in chronic kidney disease: Secondary | ICD-10-CM | POA: Diagnosis not present

## 2015-01-08 DIAGNOSIS — D631 Anemia in chronic kidney disease: Secondary | ICD-10-CM | POA: Diagnosis not present

## 2015-01-08 DIAGNOSIS — N186 End stage renal disease: Secondary | ICD-10-CM | POA: Diagnosis not present

## 2015-01-08 DIAGNOSIS — N2581 Secondary hyperparathyroidism of renal origin: Secondary | ICD-10-CM | POA: Diagnosis not present

## 2015-01-08 DIAGNOSIS — D509 Iron deficiency anemia, unspecified: Secondary | ICD-10-CM | POA: Diagnosis not present

## 2015-01-11 DIAGNOSIS — N186 End stage renal disease: Secondary | ICD-10-CM | POA: Diagnosis not present

## 2015-01-11 DIAGNOSIS — N2581 Secondary hyperparathyroidism of renal origin: Secondary | ICD-10-CM | POA: Diagnosis not present

## 2015-01-11 DIAGNOSIS — D509 Iron deficiency anemia, unspecified: Secondary | ICD-10-CM | POA: Diagnosis not present

## 2015-01-11 DIAGNOSIS — D631 Anemia in chronic kidney disease: Secondary | ICD-10-CM | POA: Diagnosis not present

## 2015-01-13 DIAGNOSIS — D631 Anemia in chronic kidney disease: Secondary | ICD-10-CM | POA: Diagnosis not present

## 2015-01-13 DIAGNOSIS — N186 End stage renal disease: Secondary | ICD-10-CM | POA: Diagnosis not present

## 2015-01-13 DIAGNOSIS — D509 Iron deficiency anemia, unspecified: Secondary | ICD-10-CM | POA: Diagnosis not present

## 2015-01-13 DIAGNOSIS — N2581 Secondary hyperparathyroidism of renal origin: Secondary | ICD-10-CM | POA: Diagnosis not present

## 2015-01-14 DIAGNOSIS — N400 Enlarged prostate without lower urinary tract symptoms: Secondary | ICD-10-CM | POA: Diagnosis not present

## 2015-01-14 DIAGNOSIS — I739 Peripheral vascular disease, unspecified: Secondary | ICD-10-CM | POA: Diagnosis not present

## 2015-01-14 DIAGNOSIS — R7309 Other abnormal glucose: Secondary | ICD-10-CM | POA: Diagnosis not present

## 2015-01-14 DIAGNOSIS — N184 Chronic kidney disease, stage 4 (severe): Secondary | ICD-10-CM | POA: Diagnosis not present

## 2015-01-14 DIAGNOSIS — R636 Underweight: Secondary | ICD-10-CM | POA: Diagnosis not present

## 2015-01-14 DIAGNOSIS — N529 Male erectile dysfunction, unspecified: Secondary | ICD-10-CM | POA: Diagnosis not present

## 2015-01-14 DIAGNOSIS — D638 Anemia in other chronic diseases classified elsewhere: Secondary | ICD-10-CM | POA: Diagnosis not present

## 2015-01-14 DIAGNOSIS — I10 Essential (primary) hypertension: Secondary | ICD-10-CM | POA: Diagnosis not present

## 2015-01-15 DIAGNOSIS — N2581 Secondary hyperparathyroidism of renal origin: Secondary | ICD-10-CM | POA: Diagnosis not present

## 2015-01-15 DIAGNOSIS — N186 End stage renal disease: Secondary | ICD-10-CM | POA: Diagnosis not present

## 2015-01-15 DIAGNOSIS — D631 Anemia in chronic kidney disease: Secondary | ICD-10-CM | POA: Diagnosis not present

## 2015-01-15 DIAGNOSIS — D509 Iron deficiency anemia, unspecified: Secondary | ICD-10-CM | POA: Diagnosis not present

## 2015-01-18 DIAGNOSIS — D509 Iron deficiency anemia, unspecified: Secondary | ICD-10-CM | POA: Diagnosis not present

## 2015-01-18 DIAGNOSIS — N186 End stage renal disease: Secondary | ICD-10-CM | POA: Diagnosis not present

## 2015-01-18 DIAGNOSIS — D631 Anemia in chronic kidney disease: Secondary | ICD-10-CM | POA: Diagnosis not present

## 2015-01-18 DIAGNOSIS — N2581 Secondary hyperparathyroidism of renal origin: Secondary | ICD-10-CM | POA: Diagnosis not present

## 2015-01-20 DIAGNOSIS — N2581 Secondary hyperparathyroidism of renal origin: Secondary | ICD-10-CM | POA: Diagnosis not present

## 2015-01-20 DIAGNOSIS — D509 Iron deficiency anemia, unspecified: Secondary | ICD-10-CM | POA: Diagnosis not present

## 2015-01-20 DIAGNOSIS — N186 End stage renal disease: Secondary | ICD-10-CM | POA: Diagnosis not present

## 2015-01-20 DIAGNOSIS — D631 Anemia in chronic kidney disease: Secondary | ICD-10-CM | POA: Diagnosis not present

## 2015-01-20 DIAGNOSIS — I120 Hypertensive chronic kidney disease with stage 5 chronic kidney disease or end stage renal disease: Secondary | ICD-10-CM | POA: Diagnosis not present

## 2015-01-20 DIAGNOSIS — Z992 Dependence on renal dialysis: Secondary | ICD-10-CM | POA: Diagnosis not present

## 2015-01-22 DIAGNOSIS — D509 Iron deficiency anemia, unspecified: Secondary | ICD-10-CM | POA: Diagnosis not present

## 2015-01-22 DIAGNOSIS — N186 End stage renal disease: Secondary | ICD-10-CM | POA: Diagnosis not present

## 2015-01-22 DIAGNOSIS — N2581 Secondary hyperparathyroidism of renal origin: Secondary | ICD-10-CM | POA: Diagnosis not present

## 2015-01-22 DIAGNOSIS — Z23 Encounter for immunization: Secondary | ICD-10-CM | POA: Diagnosis not present

## 2015-02-02 DIAGNOSIS — Z992 Dependence on renal dialysis: Secondary | ICD-10-CM | POA: Diagnosis not present

## 2015-02-02 DIAGNOSIS — I871 Compression of vein: Secondary | ICD-10-CM | POA: Diagnosis not present

## 2015-02-02 DIAGNOSIS — T82858D Stenosis of vascular prosthetic devices, implants and grafts, subsequent encounter: Secondary | ICD-10-CM | POA: Diagnosis not present

## 2015-02-02 DIAGNOSIS — N186 End stage renal disease: Secondary | ICD-10-CM | POA: Diagnosis not present

## 2015-02-20 DIAGNOSIS — N186 End stage renal disease: Secondary | ICD-10-CM | POA: Diagnosis not present

## 2015-02-20 DIAGNOSIS — I120 Hypertensive chronic kidney disease with stage 5 chronic kidney disease or end stage renal disease: Secondary | ICD-10-CM | POA: Diagnosis not present

## 2015-02-20 DIAGNOSIS — Z992 Dependence on renal dialysis: Secondary | ICD-10-CM | POA: Diagnosis not present

## 2015-02-22 DIAGNOSIS — Z992 Dependence on renal dialysis: Secondary | ICD-10-CM | POA: Diagnosis not present

## 2015-02-22 DIAGNOSIS — N186 End stage renal disease: Secondary | ICD-10-CM | POA: Diagnosis not present

## 2015-02-22 DIAGNOSIS — N2581 Secondary hyperparathyroidism of renal origin: Secondary | ICD-10-CM | POA: Diagnosis not present

## 2015-02-22 DIAGNOSIS — D631 Anemia in chronic kidney disease: Secondary | ICD-10-CM | POA: Diagnosis not present

## 2015-02-23 ENCOUNTER — Other Ambulatory Visit: Payer: Self-pay | Admitting: Gastroenterology

## 2015-02-23 DIAGNOSIS — I10 Essential (primary) hypertension: Secondary | ICD-10-CM | POA: Diagnosis not present

## 2015-02-23 DIAGNOSIS — Z1211 Encounter for screening for malignant neoplasm of colon: Secondary | ICD-10-CM | POA: Diagnosis not present

## 2015-02-23 DIAGNOSIS — N289 Disorder of kidney and ureter, unspecified: Secondary | ICD-10-CM | POA: Diagnosis not present

## 2015-03-08 DIAGNOSIS — N2581 Secondary hyperparathyroidism of renal origin: Secondary | ICD-10-CM | POA: Diagnosis not present

## 2015-03-08 DIAGNOSIS — Z992 Dependence on renal dialysis: Secondary | ICD-10-CM | POA: Diagnosis not present

## 2015-03-08 DIAGNOSIS — N186 End stage renal disease: Secondary | ICD-10-CM | POA: Diagnosis not present

## 2015-03-10 DIAGNOSIS — Z992 Dependence on renal dialysis: Secondary | ICD-10-CM | POA: Diagnosis not present

## 2015-03-10 DIAGNOSIS — N186 End stage renal disease: Secondary | ICD-10-CM | POA: Diagnosis not present

## 2015-03-10 DIAGNOSIS — N2581 Secondary hyperparathyroidism of renal origin: Secondary | ICD-10-CM | POA: Diagnosis not present

## 2015-03-15 ENCOUNTER — Encounter (HOSPITAL_COMMUNITY): Payer: Self-pay | Admitting: *Deleted

## 2015-03-16 DIAGNOSIS — K5909 Other constipation: Secondary | ICD-10-CM | POA: Diagnosis not present

## 2015-03-16 DIAGNOSIS — R972 Elevated prostate specific antigen [PSA]: Secondary | ICD-10-CM | POA: Diagnosis not present

## 2015-03-23 DIAGNOSIS — N186 End stage renal disease: Secondary | ICD-10-CM | POA: Diagnosis not present

## 2015-03-23 DIAGNOSIS — I120 Hypertensive chronic kidney disease with stage 5 chronic kidney disease or end stage renal disease: Secondary | ICD-10-CM | POA: Diagnosis not present

## 2015-03-23 DIAGNOSIS — Z992 Dependence on renal dialysis: Secondary | ICD-10-CM | POA: Diagnosis not present

## 2015-03-24 DIAGNOSIS — N2581 Secondary hyperparathyroidism of renal origin: Secondary | ICD-10-CM | POA: Diagnosis not present

## 2015-03-24 DIAGNOSIS — D631 Anemia in chronic kidney disease: Secondary | ICD-10-CM | POA: Diagnosis not present

## 2015-03-24 DIAGNOSIS — N186 End stage renal disease: Secondary | ICD-10-CM | POA: Diagnosis not present

## 2015-03-24 DIAGNOSIS — D509 Iron deficiency anemia, unspecified: Secondary | ICD-10-CM | POA: Diagnosis not present

## 2015-03-24 NOTE — H&P (Signed)
  Andrew Brandt HPI: At this time the patient denies any problems with nausea, vomiting, fevers, chills, abdominal pain, diarrhea, constipation, hematochezia, melena, GERD, or dysphagia. The patient denies any known family history of colon cancers. No complaints of chest pain, SOB, MI, or sleep apnea.  Past Medical History  Diagnosis Date  . Hypertension   . Chronic kidney disease     Dialysis Tues-Thurs.Saturday Andrew Brandt    Past Surgical History  Procedure Laterality Date  . Insertion of dialysis catheter N/A 11/23/2013    Procedure: INSERTION OF DIALYSIS CATHETER RIGHT INTERNAL JUGULAR VEIN;  Surgeon: Rosetta Posner, MD;  Location: [place];  Service: Vascular;  Laterality: N/A;  . Av fistula placement Left 11/23/2013    Procedure: ARTERIOVENOUS (AV) FISTULA CREATION VS. GRAFT;  Surgeon: Rosetta Posner, MD;  Location: [facility] OR;  Service: Vascular;  Laterality: Left;    Family History  Problem Relation Age of Onset  . Cancer Mother   . Diabetes      grandparents    Social History:  reports that he has quit smoking. He has never used smokeless tobacco. He reports that he drinks alcohol. He reports that he does not use illicit drugs.  Allergies: No Known Allergies  Medications: Scheduled: Continuous:  No results found for this or any previous visit (from the past 24 hour(s)).   No results found.  ROS:  As stated above in the HPI otherwise negative.  There were no vitals taken for this visit.    PE: Gen: NAD, Alert and Oriented HEENT:  Andrew Brandt/AT, EOMI Neck: Supple, no LAD Lungs: CTA Bilaterally CV: RRR without M/G/R ABM: Soft, NTND, +BS Ext: No C/C/E  Assessment/Plan: 1) Screening colonoscopy.  Andrew Brandt D 03/24/2015, 7:31 AM

## 2015-03-25 ENCOUNTER — Ambulatory Visit (HOSPITAL_COMMUNITY)
Admission: RE | Admit: 2015-03-25 | Discharge: 2015-03-25 | Disposition: A | Discharge status: Home / Self Care | Payer: Medicare Other | Source: Ambulatory Visit | Attending: Gastroenterology | Admitting: Gastroenterology

## 2015-03-25 ENCOUNTER — Encounter (HOSPITAL_COMMUNITY): Payer: Self-pay | Admitting: Anesthesiology

## 2015-03-25 ENCOUNTER — Ambulatory Visit (HOSPITAL_COMMUNITY): Payer: Medicare Other | Admitting: Anesthesiology

## 2015-03-25 ENCOUNTER — Encounter (HOSPITAL_COMMUNITY)
Admission: RE | Disposition: A | Discharge status: Home / Self Care | Payer: Self-pay | Source: Ambulatory Visit | Attending: Gastroenterology

## 2015-03-25 DIAGNOSIS — I129 Hypertensive chronic kidney disease with stage 1 through stage 4 chronic kidney disease, or unspecified chronic kidney disease: Secondary | ICD-10-CM | POA: Insufficient documentation

## 2015-03-25 DIAGNOSIS — R000 Tachycardia, unspecified: Secondary | ICD-10-CM | POA: Insufficient documentation

## 2015-03-25 DIAGNOSIS — Z1211 Encounter for screening for malignant neoplasm of colon: Secondary | ICD-10-CM | POA: Diagnosis not present

## 2015-03-25 DIAGNOSIS — K5730 Diverticulosis of large intestine without perforation or abscess without bleeding: Secondary | ICD-10-CM | POA: Insufficient documentation

## 2015-03-25 DIAGNOSIS — N189 Chronic kidney disease, unspecified: Secondary | ICD-10-CM | POA: Diagnosis not present

## 2015-03-25 DIAGNOSIS — Z87891 Personal history of nicotine dependence: Secondary | ICD-10-CM | POA: Diagnosis not present

## 2015-03-25 DIAGNOSIS — I10 Essential (primary) hypertension: Secondary | ICD-10-CM | POA: Diagnosis not present

## 2015-03-25 DIAGNOSIS — D123 Benign neoplasm of transverse colon: Secondary | ICD-10-CM | POA: Diagnosis not present

## 2015-03-25 DIAGNOSIS — D122 Benign neoplasm of ascending colon: Secondary | ICD-10-CM | POA: Insufficient documentation

## 2015-03-25 DIAGNOSIS — Z992 Dependence on renal dialysis: Secondary | ICD-10-CM | POA: Insufficient documentation

## 2015-03-25 HISTORY — PX: COLONOSCOPY WITH PROPOFOL: SHX5780

## 2015-03-25 HISTORY — DX: Chronic kidney disease, unspecified: N18.9

## 2015-03-25 SURGERY — COLONOSCOPY WITH PROPOFOL
Anesthesia: Monitor Anesthesia Care

## 2015-03-25 MED ORDER — LIDOCAINE HCL 1 % IJ SOLN
INTRAMUSCULAR | Status: DC | PRN
  Administered 2015-03-25: 30 mg via INTRADERMAL
  Administered 2015-03-25: 40 mg via INTRADERMAL

## 2015-03-25 MED ORDER — PROPOFOL 10 MG/ML IV BOLUS
INTRAVENOUS | Status: AC
  Filled 2015-03-25: qty 20

## 2015-03-25 MED ORDER — LIDOCAINE HCL (CARDIAC) 20 MG/ML IV SOLN
INTRAVENOUS | Status: AC
  Filled 2015-03-25: qty 5

## 2015-03-25 MED ORDER — PROPOFOL 10 MG/ML IV BOLUS
INTRAVENOUS | Status: DC | PRN
  Administered 2015-03-25: 10 mg via INTRAVENOUS
  Administered 2015-03-25: 20 mg via INTRAVENOUS
  Administered 2015-03-25: 10 mg via INTRAVENOUS
  Administered 2015-03-25: 60 mg via INTRAVENOUS
  Administered 2015-03-25: 20 mg via INTRAVENOUS
  Administered 2015-03-25 (×3): 10 mg via INTRAVENOUS
  Administered 2015-03-25: 20 mg via INTRAVENOUS
  Administered 2015-03-25 (×4): 10 mg via INTRAVENOUS
  Administered 2015-03-25: 20 mg via INTRAVENOUS
  Administered 2015-03-25: 10 mg via INTRAVENOUS
  Administered 2015-03-25: 20 mg via INTRAVENOUS
  Administered 2015-03-25 (×4): 10 mg via INTRAVENOUS
  Administered 2015-03-25 (×2): 20 mg via INTRAVENOUS
  Administered 2015-03-25: 10 mg via INTRAVENOUS
  Administered 2015-03-25 (×4): 20 mg via INTRAVENOUS
  Administered 2015-03-25 (×4): 10 mg via INTRAVENOUS

## 2015-03-25 MED ORDER — PHENYLEPHRINE 40 MCG/ML (10ML) SYRINGE FOR IV PUSH (FOR BLOOD PRESSURE SUPPORT)
PREFILLED_SYRINGE | INTRAVENOUS | Status: AC
  Filled 2015-03-25: qty 10

## 2015-03-25 MED ORDER — SODIUM CHLORIDE 0.9 % IV SOLN
INTRAVENOUS | Status: DC
  Administered 2015-03-25 (×2): via INTRAVENOUS

## 2015-03-25 MED ORDER — KETAMINE HCL 10 MG/ML IJ SOLN
INTRAMUSCULAR | Status: AC
  Filled 2015-03-25: qty 1

## 2015-03-25 MED ORDER — PHENYLEPHRINE HCL 10 MG/ML IJ SOLN
INTRAMUSCULAR | Status: DC | PRN
  Administered 2015-03-25 (×6): 40 ug via INTRAVENOUS
  Administered 2015-03-25: 120 ug via INTRAVENOUS
  Administered 2015-03-25: 80 ug via INTRAVENOUS
  Administered 2015-03-25 (×2): 40 ug via INTRAVENOUS
  Administered 2015-03-25: 80 ug via INTRAVENOUS
  Administered 2015-03-25 (×4): 40 ug via INTRAVENOUS
  Administered 2015-03-25 (×2): 80 ug via INTRAVENOUS

## 2015-03-25 SURGICAL SUPPLY — 21 items

## 2015-03-25 NOTE — Anesthesia Preprocedure Evaluation (Signed)
Anesthesia Evaluation  Patient identified by MRN, date of birth, ID band Patient awake    Reviewed: Allergy & Precautions, H&P , NPO status , Patient's Chart, lab work & pertinent test results  History of Anesthesia Complications Negative for: history of anesthetic complications  Airway Mallampati: II  TM Distance: >3 FB Neck ROM: Full    Dental  (+) Missing, Poor Dentition, Dental Advisory Given   Pulmonary former smoker,    Pulmonary exam normal       Cardiovascular hypertension, Pt. on medications Normal cardiovascular examRate:Tachycardia     Neuro/Psych negative neurological ROS  negative psych ROS   GI/Hepatic negative GI ROS, Neg liver ROS,   Endo/Other    Renal/GU ESRF and DialysisRenal disease     Musculoskeletal   Abdominal   Peds  Hematology   Anesthesia Other Findings   Reproductive/Obstetrics                             Anesthesia Physical  Anesthesia Plan  ASA: IV  Anesthesia Plan: MAC   Post-op Pain Management:    Induction:   Airway Management Planned: Simple Face Mask  Additional Equipment:   Intra-op Plan:   Post-operative Plan:   Informed Consent: I have reviewed the patients History and Physical, chart, labs and discussed the procedure including the risks, benefits and alternatives for the proposed anesthesia with the patient or authorized representative who has indicated his/her understanding and acceptance.   Dental advisory given  Plan Discussed with: CRNA, Anesthesiologist and Surgeon  Anesthesia Plan Comments:         Anesthesia Quick Evaluation

## 2015-03-25 NOTE — Discharge Instructions (Signed)
Colonoscopy, Care After °These instructions give you information on caring for yourself after your procedure. Your doctor may also give you more specific instructions. Call your doctor if you have any problems or questions after your procedure. °HOME CARE °· Do not drive for 24 hours. °· Do not sign important papers or use machinery for 24 hours. °· You may shower. °· You may go back to your usual activities, but go slower for the first 24 hours. °· Take rest breaks often during the first 24 hours. °· Walk around or use warm packs on your belly (abdomen) if you have belly cramping or gas. °· Drink enough fluids to keep your pee (urine) clear or pale yellow. °· Resume your normal diet. Avoid heavy or fried foods. °· Avoid drinking alcohol for 24 hours or as told by your doctor. °· Only take medicines as told by your doctor. °If a tissue sample (biopsy) was taken during the procedure:  °· Do not take aspirin or blood thinners for 7 days, or as told by your doctor. °· Do not drink alcohol for 7 days, or as told by your doctor. °· Eat soft foods for the first 24 hours. °GET HELP IF: °You still have a small amount of blood in your poop (stool) 2-3 days after the procedure. °GET HELP RIGHT AWAY IF: °· You have more than a small amount of blood in your poop. °· You see clumps of tissue (blood clots) in your poop. °· Your belly is puffy (swollen). °· You feel sick to your stomach (nauseous) or throw up (vomit). °· You have a fever. °· You have belly pain that gets worse and medicine does not help. °MAKE SURE YOU: °· Understand these instructions. °· Will watch your condition. °· Will get help right away if you are not doing well or get worse. °Document Released: 08/11/2010 Document Revised: 07/14/2013 Document Reviewed: 03/16/2013 °Exit[employer]® Patient Information ©2015 [employer]. This information is not intended to replace advice given to you by your health care provider. Make sure you discuss any questions you have with  your health care provider. ° °

## 2015-03-25 NOTE — Transfer of Care (Signed)
Immediate Anesthesia Transfer of Care Note  Patient: Andrew Brandt  Procedure(s) Performed: Procedure(s): COLONOSCOPY WITH PROPOFOL (N/A)  Patient Location: PACU and Endoscopy Unit  Anesthesia Type:MAC  Level of Consciousness: awake, alert  and oriented  Airway & Oxygen Therapy: Patient Spontanous Breathing and Patient connected to face mask oxygen  Post-op Assessment: Report given to RN and Post -op Vital signs reviewed and stable  Post vital signs: Reviewed and stable  Last Vitals:  Filed Vitals:   03/25/15 0844  BP: 77/27  Pulse: 79  Temp: 36.4 C  Resp: 10    Complications: No apparent anesthesia complications

## 2015-03-25 NOTE — Op Note (Signed)
[facility] [place], [address]   COLONOSCOPY PROCEDURE REPORT  PATIENT: Andrew Brandt, Andrew Brandt  MR#: [id number] BIRTHDATE: 23-Aug-1950 , 63  yrs. old GENDER: male ENDOSCOPIST: Carol Ada, MD REFERRED BY: PROCEDURE DATE:  03-27-2015 PROCEDURE:   Colonoscopy with snare polypectomy ASA CLASS:   Class III INDICATIONS: Screening MEDICATIONS: Monitored anesthesia care  DESCRIPTION OF PROCEDURE:   After the risks and benefits and of the procedure were explained, informed consent was obtained.  revealed no abnormalities of the rectum.    The Pentax Adult Colonscope [id number]  endoscope was introduced through the anus and advanced to the cecum, which was identified by both the appendix and ileocecal valve .  The quality of the prep was good. .  The instrument was then slowly withdrawn as the colon was fully examined. Estimated blood loss is zero unless otherwise noted in this procedure report.     FINDINGS: The colon was poorly prepped, however, with extensive washing good to excellent views of the mucosa were obtained. The procedure was also very difficult to perform as has he had a long colon and it was floppy resulting in significant looping. Abdominal pressure was required to achieve cecal intubation. The only exception was the rectum.  A large retained portion of stool precluded a complete view in the retroflexed position.  In the ascending colon four polyps were identified.  The polyps measured from 1-1.5 cm and they were removed with a hot snare.  the three smaller polyps gave the suspicion of a high potential of a post polypectomy bleed.  Three hemoclips were successfully deployed across the sites.  A 3 mm sessile transverse colon polyp was removed with a cold snare.  Scattered left sided diverticula were noted.     Retroflexed views revealed no abnormalities.     The scope was then withdrawn from the patient and the  procedure completed.  WITHDRAWAL TIME: 31 minutes  COMPLICATIONS: There were no immediate complications. ENDOSCOPIC IMPRESSION: 1) Colonic polyps. 2) Diverticula.  RECOMMENDATIONS: 1) Follow up biopsy results. 2) Repeat the colonoscopy in 3 years.  REPEAT EXAM:  cc:  _______________________________ eSignedCarol Ada, MD 03/27/2015 8:46 AM   CPT CODES: ICD CODES:  The ICD and CPT codes recommended by this software are interpretations from the data that the clinical staff has captured with the software.  The verification of the translation of this report to the ICD and CPT codes and modifiers is the sole responsibility of the health care institution and practicing physician where this report was generated.  [employer] will not be held responsible for the validity of the ICD and CPT codes included on this report.  AMA assumes no liability for data contained or not contained herein. CPT is a Designer, television/film set of the [employer].   PATIENT NAME:  Andrew Brandt, Andrew Brandt MR#: [id number]

## 2015-03-25 NOTE — Anesthesia Postprocedure Evaluation (Signed)
  Anesthesia Post-op Note  Patient: Andrew Brandt  Procedure(s) Performed: Procedure(s) (LRB): COLONOSCOPY WITH PROPOFOL (N/A)  Patient Location: PACU  Anesthesia Type: MAC  Level of Consciousness: awake and alert   Airway and Oxygen Therapy: Patient Spontanous Breathing  Post-op Pain: mild  Post-op Assessment: Post-op Vital signs reviewed, Patient's Cardiovascular Status Stable, Respiratory Function Stable, Patent Airway and No signs of Nausea or vomiting  Last Vitals:  Filed Vitals:   03/25/15 0905  BP: 120/65  Pulse: 87  Temp:   Resp: 15    Post-op Vital Signs: stable   Complications: No apparent anesthesia complications

## 2015-03-26 DIAGNOSIS — N186 End stage renal disease: Secondary | ICD-10-CM | POA: Diagnosis not present

## 2015-03-26 DIAGNOSIS — D631 Anemia in chronic kidney disease: Secondary | ICD-10-CM | POA: Diagnosis not present

## 2015-03-26 DIAGNOSIS — N2581 Secondary hyperparathyroidism of renal origin: Secondary | ICD-10-CM | POA: Diagnosis not present

## 2015-03-26 DIAGNOSIS — D509 Iron deficiency anemia, unspecified: Secondary | ICD-10-CM | POA: Diagnosis not present

## 2015-03-27 ENCOUNTER — Encounter (HOSPITAL_COMMUNITY): Payer: Self-pay | Admitting: Gastroenterology

## 2015-03-29 DIAGNOSIS — D631 Anemia in chronic kidney disease: Secondary | ICD-10-CM | POA: Diagnosis not present

## 2015-03-29 DIAGNOSIS — D509 Iron deficiency anemia, unspecified: Secondary | ICD-10-CM | POA: Diagnosis not present

## 2015-03-29 DIAGNOSIS — N186 End stage renal disease: Secondary | ICD-10-CM | POA: Diagnosis not present

## 2015-03-29 DIAGNOSIS — N2581 Secondary hyperparathyroidism of renal origin: Secondary | ICD-10-CM | POA: Diagnosis not present

## 2015-03-31 DIAGNOSIS — D631 Anemia in chronic kidney disease: Secondary | ICD-10-CM | POA: Diagnosis not present

## 2015-03-31 DIAGNOSIS — N2581 Secondary hyperparathyroidism of renal origin: Secondary | ICD-10-CM | POA: Diagnosis not present

## 2015-03-31 DIAGNOSIS — D509 Iron deficiency anemia, unspecified: Secondary | ICD-10-CM | POA: Diagnosis not present

## 2015-03-31 DIAGNOSIS — N186 End stage renal disease: Secondary | ICD-10-CM | POA: Diagnosis not present

## 2015-04-02 DIAGNOSIS — D509 Iron deficiency anemia, unspecified: Secondary | ICD-10-CM | POA: Diagnosis not present

## 2015-04-02 DIAGNOSIS — D631 Anemia in chronic kidney disease: Secondary | ICD-10-CM | POA: Diagnosis not present

## 2015-04-02 DIAGNOSIS — N186 End stage renal disease: Secondary | ICD-10-CM | POA: Diagnosis not present

## 2015-04-02 DIAGNOSIS — N2581 Secondary hyperparathyroidism of renal origin: Secondary | ICD-10-CM | POA: Diagnosis not present

## 2015-04-05 DIAGNOSIS — N2581 Secondary hyperparathyroidism of renal origin: Secondary | ICD-10-CM | POA: Diagnosis not present

## 2015-04-05 DIAGNOSIS — D509 Iron deficiency anemia, unspecified: Secondary | ICD-10-CM | POA: Diagnosis not present

## 2015-04-05 DIAGNOSIS — D631 Anemia in chronic kidney disease: Secondary | ICD-10-CM | POA: Diagnosis not present

## 2015-04-05 DIAGNOSIS — N186 End stage renal disease: Secondary | ICD-10-CM | POA: Diagnosis not present

## 2015-04-07 DIAGNOSIS — D631 Anemia in chronic kidney disease: Secondary | ICD-10-CM | POA: Diagnosis not present

## 2015-04-07 DIAGNOSIS — N2581 Secondary hyperparathyroidism of renal origin: Secondary | ICD-10-CM | POA: Diagnosis not present

## 2015-04-07 DIAGNOSIS — D509 Iron deficiency anemia, unspecified: Secondary | ICD-10-CM | POA: Diagnosis not present

## 2015-04-07 DIAGNOSIS — N186 End stage renal disease: Secondary | ICD-10-CM | POA: Diagnosis not present

## 2015-04-11 DIAGNOSIS — D509 Iron deficiency anemia, unspecified: Secondary | ICD-10-CM | POA: Diagnosis not present

## 2015-04-11 DIAGNOSIS — D631 Anemia in chronic kidney disease: Secondary | ICD-10-CM | POA: Diagnosis not present

## 2015-04-11 DIAGNOSIS — N2581 Secondary hyperparathyroidism of renal origin: Secondary | ICD-10-CM | POA: Diagnosis not present

## 2015-04-11 DIAGNOSIS — N186 End stage renal disease: Secondary | ICD-10-CM | POA: Diagnosis not present

## 2015-04-12 DIAGNOSIS — N186 End stage renal disease: Secondary | ICD-10-CM | POA: Diagnosis not present

## 2015-04-12 DIAGNOSIS — D509 Iron deficiency anemia, unspecified: Secondary | ICD-10-CM | POA: Diagnosis not present

## 2015-04-12 DIAGNOSIS — D631 Anemia in chronic kidney disease: Secondary | ICD-10-CM | POA: Diagnosis not present

## 2015-04-12 DIAGNOSIS — N2581 Secondary hyperparathyroidism of renal origin: Secondary | ICD-10-CM | POA: Diagnosis not present

## 2015-04-13 DIAGNOSIS — R972 Elevated prostate specific antigen [PSA]: Secondary | ICD-10-CM | POA: Diagnosis not present

## 2015-04-13 DIAGNOSIS — Z992 Dependence on renal dialysis: Secondary | ICD-10-CM | POA: Diagnosis not present

## 2015-04-13 DIAGNOSIS — N189 Chronic kidney disease, unspecified: Secondary | ICD-10-CM | POA: Diagnosis not present

## 2015-04-13 DIAGNOSIS — I129 Hypertensive chronic kidney disease with stage 1 through stage 4 chronic kidney disease, or unspecified chronic kidney disease: Secondary | ICD-10-CM | POA: Diagnosis not present

## 2015-04-13 DIAGNOSIS — C61 Malignant neoplasm of prostate: Secondary | ICD-10-CM | POA: Diagnosis not present

## 2015-04-14 DIAGNOSIS — N2581 Secondary hyperparathyroidism of renal origin: Secondary | ICD-10-CM | POA: Diagnosis not present

## 2015-04-14 DIAGNOSIS — D509 Iron deficiency anemia, unspecified: Secondary | ICD-10-CM | POA: Diagnosis not present

## 2015-04-14 DIAGNOSIS — N186 End stage renal disease: Secondary | ICD-10-CM | POA: Diagnosis not present

## 2015-04-14 DIAGNOSIS — D631 Anemia in chronic kidney disease: Secondary | ICD-10-CM | POA: Diagnosis not present

## 2015-04-16 DIAGNOSIS — D631 Anemia in chronic kidney disease: Secondary | ICD-10-CM | POA: Diagnosis not present

## 2015-04-16 DIAGNOSIS — D509 Iron deficiency anemia, unspecified: Secondary | ICD-10-CM | POA: Diagnosis not present

## 2015-04-16 DIAGNOSIS — N186 End stage renal disease: Secondary | ICD-10-CM | POA: Diagnosis not present

## 2015-04-16 DIAGNOSIS — N2581 Secondary hyperparathyroidism of renal origin: Secondary | ICD-10-CM | POA: Diagnosis not present

## 2015-04-19 DIAGNOSIS — D509 Iron deficiency anemia, unspecified: Secondary | ICD-10-CM | POA: Diagnosis not present

## 2015-04-19 DIAGNOSIS — N2581 Secondary hyperparathyroidism of renal origin: Secondary | ICD-10-CM | POA: Diagnosis not present

## 2015-04-19 DIAGNOSIS — N186 End stage renal disease: Secondary | ICD-10-CM | POA: Diagnosis not present

## 2015-04-19 DIAGNOSIS — D631 Anemia in chronic kidney disease: Secondary | ICD-10-CM | POA: Diagnosis not present

## 2015-04-21 DIAGNOSIS — N186 End stage renal disease: Secondary | ICD-10-CM | POA: Diagnosis not present

## 2015-04-21 DIAGNOSIS — D631 Anemia in chronic kidney disease: Secondary | ICD-10-CM | POA: Diagnosis not present

## 2015-04-21 DIAGNOSIS — D509 Iron deficiency anemia, unspecified: Secondary | ICD-10-CM | POA: Diagnosis not present

## 2015-04-21 DIAGNOSIS — N2581 Secondary hyperparathyroidism of renal origin: Secondary | ICD-10-CM | POA: Diagnosis not present

## 2015-04-22 DIAGNOSIS — I120 Hypertensive chronic kidney disease with stage 5 chronic kidney disease or end stage renal disease: Secondary | ICD-10-CM | POA: Diagnosis not present

## 2015-04-22 DIAGNOSIS — N186 End stage renal disease: Secondary | ICD-10-CM | POA: Diagnosis not present

## 2015-04-22 DIAGNOSIS — Z992 Dependence on renal dialysis: Secondary | ICD-10-CM | POA: Diagnosis not present

## 2015-04-23 DIAGNOSIS — N2581 Secondary hyperparathyroidism of renal origin: Secondary | ICD-10-CM | POA: Diagnosis not present

## 2015-04-23 DIAGNOSIS — N186 End stage renal disease: Secondary | ICD-10-CM | POA: Diagnosis not present

## 2015-04-23 DIAGNOSIS — D509 Iron deficiency anemia, unspecified: Secondary | ICD-10-CM | POA: Diagnosis not present

## 2015-04-23 DIAGNOSIS — D631 Anemia in chronic kidney disease: Secondary | ICD-10-CM | POA: Diagnosis not present

## 2015-04-23 DIAGNOSIS — Z23 Encounter for immunization: Secondary | ICD-10-CM | POA: Diagnosis not present

## 2015-04-26 DIAGNOSIS — N2581 Secondary hyperparathyroidism of renal origin: Secondary | ICD-10-CM | POA: Diagnosis not present

## 2015-04-26 DIAGNOSIS — D509 Iron deficiency anemia, unspecified: Secondary | ICD-10-CM | POA: Diagnosis not present

## 2015-04-26 DIAGNOSIS — N186 End stage renal disease: Secondary | ICD-10-CM | POA: Diagnosis not present

## 2015-04-26 DIAGNOSIS — D631 Anemia in chronic kidney disease: Secondary | ICD-10-CM | POA: Diagnosis not present

## 2015-04-26 DIAGNOSIS — Z23 Encounter for immunization: Secondary | ICD-10-CM | POA: Diagnosis not present

## 2015-04-28 DIAGNOSIS — D631 Anemia in chronic kidney disease: Secondary | ICD-10-CM | POA: Diagnosis not present

## 2015-04-28 DIAGNOSIS — N2581 Secondary hyperparathyroidism of renal origin: Secondary | ICD-10-CM | POA: Diagnosis not present

## 2015-04-28 DIAGNOSIS — Z23 Encounter for immunization: Secondary | ICD-10-CM | POA: Diagnosis not present

## 2015-04-28 DIAGNOSIS — C61 Malignant neoplasm of prostate: Secondary | ICD-10-CM | POA: Diagnosis not present

## 2015-04-28 DIAGNOSIS — N186 End stage renal disease: Secondary | ICD-10-CM | POA: Diagnosis not present

## 2015-04-28 DIAGNOSIS — K5909 Other constipation: Secondary | ICD-10-CM | POA: Diagnosis not present

## 2015-04-28 DIAGNOSIS — D509 Iron deficiency anemia, unspecified: Secondary | ICD-10-CM | POA: Diagnosis not present

## 2015-04-28 DIAGNOSIS — Z992 Dependence on renal dialysis: Secondary | ICD-10-CM | POA: Diagnosis not present

## 2015-04-30 DIAGNOSIS — N2581 Secondary hyperparathyroidism of renal origin: Secondary | ICD-10-CM | POA: Diagnosis not present

## 2015-04-30 DIAGNOSIS — D509 Iron deficiency anemia, unspecified: Secondary | ICD-10-CM | POA: Diagnosis not present

## 2015-04-30 DIAGNOSIS — D631 Anemia in chronic kidney disease: Secondary | ICD-10-CM | POA: Diagnosis not present

## 2015-04-30 DIAGNOSIS — Z23 Encounter for immunization: Secondary | ICD-10-CM | POA: Diagnosis not present

## 2015-04-30 DIAGNOSIS — N186 End stage renal disease: Secondary | ICD-10-CM | POA: Diagnosis not present

## 2015-05-03 DIAGNOSIS — N186 End stage renal disease: Secondary | ICD-10-CM | POA: Diagnosis not present

## 2015-05-03 DIAGNOSIS — Z23 Encounter for immunization: Secondary | ICD-10-CM | POA: Diagnosis not present

## 2015-05-03 DIAGNOSIS — N2581 Secondary hyperparathyroidism of renal origin: Secondary | ICD-10-CM | POA: Diagnosis not present

## 2015-05-03 DIAGNOSIS — D509 Iron deficiency anemia, unspecified: Secondary | ICD-10-CM | POA: Diagnosis not present

## 2015-05-03 DIAGNOSIS — D631 Anemia in chronic kidney disease: Secondary | ICD-10-CM | POA: Diagnosis not present

## 2015-05-05 DIAGNOSIS — D631 Anemia in chronic kidney disease: Secondary | ICD-10-CM | POA: Diagnosis not present

## 2015-05-05 DIAGNOSIS — N186 End stage renal disease: Secondary | ICD-10-CM | POA: Diagnosis not present

## 2015-05-05 DIAGNOSIS — D509 Iron deficiency anemia, unspecified: Secondary | ICD-10-CM | POA: Diagnosis not present

## 2015-05-05 DIAGNOSIS — N2581 Secondary hyperparathyroidism of renal origin: Secondary | ICD-10-CM | POA: Diagnosis not present

## 2015-05-05 DIAGNOSIS — Z23 Encounter for immunization: Secondary | ICD-10-CM | POA: Diagnosis not present

## 2015-05-07 DIAGNOSIS — N186 End stage renal disease: Secondary | ICD-10-CM | POA: Diagnosis not present

## 2015-05-07 DIAGNOSIS — Z23 Encounter for immunization: Secondary | ICD-10-CM | POA: Diagnosis not present

## 2015-05-07 DIAGNOSIS — D631 Anemia in chronic kidney disease: Secondary | ICD-10-CM | POA: Diagnosis not present

## 2015-05-07 DIAGNOSIS — D509 Iron deficiency anemia, unspecified: Secondary | ICD-10-CM | POA: Diagnosis not present

## 2015-05-07 DIAGNOSIS — N2581 Secondary hyperparathyroidism of renal origin: Secondary | ICD-10-CM | POA: Diagnosis not present

## 2015-05-10 DIAGNOSIS — D631 Anemia in chronic kidney disease: Secondary | ICD-10-CM | POA: Diagnosis not present

## 2015-05-10 DIAGNOSIS — D509 Iron deficiency anemia, unspecified: Secondary | ICD-10-CM | POA: Diagnosis not present

## 2015-05-10 DIAGNOSIS — N2581 Secondary hyperparathyroidism of renal origin: Secondary | ICD-10-CM | POA: Diagnosis not present

## 2015-05-10 DIAGNOSIS — N186 End stage renal disease: Secondary | ICD-10-CM | POA: Diagnosis not present

## 2015-05-10 DIAGNOSIS — Z23 Encounter for immunization: Secondary | ICD-10-CM | POA: Diagnosis not present

## 2015-05-12 DIAGNOSIS — N186 End stage renal disease: Secondary | ICD-10-CM | POA: Diagnosis not present

## 2015-05-12 DIAGNOSIS — Z23 Encounter for immunization: Secondary | ICD-10-CM | POA: Diagnosis not present

## 2015-05-12 DIAGNOSIS — D631 Anemia in chronic kidney disease: Secondary | ICD-10-CM | POA: Diagnosis not present

## 2015-05-12 DIAGNOSIS — D509 Iron deficiency anemia, unspecified: Secondary | ICD-10-CM | POA: Diagnosis not present

## 2015-05-12 DIAGNOSIS — N2581 Secondary hyperparathyroidism of renal origin: Secondary | ICD-10-CM | POA: Diagnosis not present

## 2015-05-14 DIAGNOSIS — D631 Anemia in chronic kidney disease: Secondary | ICD-10-CM | POA: Diagnosis not present

## 2015-05-14 DIAGNOSIS — D509 Iron deficiency anemia, unspecified: Secondary | ICD-10-CM | POA: Diagnosis not present

## 2015-05-14 DIAGNOSIS — N2581 Secondary hyperparathyroidism of renal origin: Secondary | ICD-10-CM | POA: Diagnosis not present

## 2015-05-14 DIAGNOSIS — Z23 Encounter for immunization: Secondary | ICD-10-CM | POA: Diagnosis not present

## 2015-05-14 DIAGNOSIS — N186 End stage renal disease: Secondary | ICD-10-CM | POA: Diagnosis not present

## 2015-05-17 DIAGNOSIS — Z23 Encounter for immunization: Secondary | ICD-10-CM | POA: Diagnosis not present

## 2015-05-17 DIAGNOSIS — D509 Iron deficiency anemia, unspecified: Secondary | ICD-10-CM | POA: Diagnosis not present

## 2015-05-17 DIAGNOSIS — D631 Anemia in chronic kidney disease: Secondary | ICD-10-CM | POA: Diagnosis not present

## 2015-05-17 DIAGNOSIS — N2581 Secondary hyperparathyroidism of renal origin: Secondary | ICD-10-CM | POA: Diagnosis not present

## 2015-05-17 DIAGNOSIS — N186 End stage renal disease: Secondary | ICD-10-CM | POA: Diagnosis not present

## 2015-05-19 DIAGNOSIS — N2581 Secondary hyperparathyroidism of renal origin: Secondary | ICD-10-CM | POA: Diagnosis not present

## 2015-05-19 DIAGNOSIS — N186 End stage renal disease: Secondary | ICD-10-CM | POA: Diagnosis not present

## 2015-05-19 DIAGNOSIS — D509 Iron deficiency anemia, unspecified: Secondary | ICD-10-CM | POA: Diagnosis not present

## 2015-05-19 DIAGNOSIS — D631 Anemia in chronic kidney disease: Secondary | ICD-10-CM | POA: Diagnosis not present

## 2015-05-19 DIAGNOSIS — Z23 Encounter for immunization: Secondary | ICD-10-CM | POA: Diagnosis not present

## 2015-05-21 DIAGNOSIS — Z23 Encounter for immunization: Secondary | ICD-10-CM | POA: Diagnosis not present

## 2015-05-21 DIAGNOSIS — N2581 Secondary hyperparathyroidism of renal origin: Secondary | ICD-10-CM | POA: Diagnosis not present

## 2015-05-21 DIAGNOSIS — D509 Iron deficiency anemia, unspecified: Secondary | ICD-10-CM | POA: Diagnosis not present

## 2015-05-21 DIAGNOSIS — N186 End stage renal disease: Secondary | ICD-10-CM | POA: Diagnosis not present

## 2015-05-21 DIAGNOSIS — D631 Anemia in chronic kidney disease: Secondary | ICD-10-CM | POA: Diagnosis not present

## 2015-05-23 DIAGNOSIS — N184 Chronic kidney disease, stage 4 (severe): Secondary | ICD-10-CM | POA: Diagnosis not present

## 2015-05-23 DIAGNOSIS — N529 Male erectile dysfunction, unspecified: Secondary | ICD-10-CM | POA: Diagnosis not present

## 2015-05-23 DIAGNOSIS — Z0000 Encounter for general adult medical examination without abnormal findings: Secondary | ICD-10-CM | POA: Diagnosis not present

## 2015-05-23 DIAGNOSIS — R636 Underweight: Secondary | ICD-10-CM | POA: Diagnosis not present

## 2015-05-23 DIAGNOSIS — Z0100 Encounter for examination of eyes and vision without abnormal findings: Secondary | ICD-10-CM | POA: Diagnosis not present

## 2015-05-23 DIAGNOSIS — H538 Other visual disturbances: Secondary | ICD-10-CM | POA: Diagnosis not present

## 2015-05-23 DIAGNOSIS — D638 Anemia in other chronic diseases classified elsewhere: Secondary | ICD-10-CM | POA: Diagnosis not present

## 2015-05-23 DIAGNOSIS — I10 Essential (primary) hypertension: Secondary | ICD-10-CM | POA: Diagnosis not present

## 2015-05-23 DIAGNOSIS — R7301 Impaired fasting glucose: Secondary | ICD-10-CM | POA: Diagnosis not present

## 2015-05-23 DIAGNOSIS — Z992 Dependence on renal dialysis: Secondary | ICD-10-CM | POA: Diagnosis not present

## 2015-05-23 DIAGNOSIS — N186 End stage renal disease: Secondary | ICD-10-CM | POA: Diagnosis not present

## 2015-05-23 DIAGNOSIS — I739 Peripheral vascular disease, unspecified: Secondary | ICD-10-CM | POA: Diagnosis not present

## 2015-05-23 DIAGNOSIS — I120 Hypertensive chronic kidney disease with stage 5 chronic kidney disease or end stage renal disease: Secondary | ICD-10-CM | POA: Diagnosis not present

## 2015-05-24 DIAGNOSIS — N2581 Secondary hyperparathyroidism of renal origin: Secondary | ICD-10-CM | POA: Diagnosis not present

## 2015-05-24 DIAGNOSIS — D631 Anemia in chronic kidney disease: Secondary | ICD-10-CM | POA: Diagnosis not present

## 2015-05-24 DIAGNOSIS — N186 End stage renal disease: Secondary | ICD-10-CM | POA: Diagnosis not present

## 2015-05-24 DIAGNOSIS — D509 Iron deficiency anemia, unspecified: Secondary | ICD-10-CM | POA: Diagnosis not present

## 2015-05-26 DIAGNOSIS — D631 Anemia in chronic kidney disease: Secondary | ICD-10-CM | POA: Diagnosis not present

## 2015-05-26 DIAGNOSIS — N186 End stage renal disease: Secondary | ICD-10-CM | POA: Diagnosis not present

## 2015-05-26 DIAGNOSIS — N2581 Secondary hyperparathyroidism of renal origin: Secondary | ICD-10-CM | POA: Diagnosis not present

## 2015-05-26 DIAGNOSIS — D509 Iron deficiency anemia, unspecified: Secondary | ICD-10-CM | POA: Diagnosis not present

## 2015-05-28 DIAGNOSIS — D631 Anemia in chronic kidney disease: Secondary | ICD-10-CM | POA: Diagnosis not present

## 2015-05-28 DIAGNOSIS — N2581 Secondary hyperparathyroidism of renal origin: Secondary | ICD-10-CM | POA: Diagnosis not present

## 2015-05-28 DIAGNOSIS — N186 End stage renal disease: Secondary | ICD-10-CM | POA: Diagnosis not present

## 2015-05-28 DIAGNOSIS — D509 Iron deficiency anemia, unspecified: Secondary | ICD-10-CM | POA: Diagnosis not present

## 2015-05-31 DIAGNOSIS — N186 End stage renal disease: Secondary | ICD-10-CM | POA: Diagnosis not present

## 2015-05-31 DIAGNOSIS — D631 Anemia in chronic kidney disease: Secondary | ICD-10-CM | POA: Diagnosis not present

## 2015-05-31 DIAGNOSIS — D509 Iron deficiency anemia, unspecified: Secondary | ICD-10-CM | POA: Diagnosis not present

## 2015-05-31 DIAGNOSIS — N2581 Secondary hyperparathyroidism of renal origin: Secondary | ICD-10-CM | POA: Diagnosis not present

## 2015-06-02 DIAGNOSIS — N186 End stage renal disease: Secondary | ICD-10-CM | POA: Diagnosis not present

## 2015-06-02 DIAGNOSIS — N2581 Secondary hyperparathyroidism of renal origin: Secondary | ICD-10-CM | POA: Diagnosis not present

## 2015-06-02 DIAGNOSIS — D631 Anemia in chronic kidney disease: Secondary | ICD-10-CM | POA: Diagnosis not present

## 2015-06-02 DIAGNOSIS — D509 Iron deficiency anemia, unspecified: Secondary | ICD-10-CM | POA: Diagnosis not present

## 2015-06-04 DIAGNOSIS — D509 Iron deficiency anemia, unspecified: Secondary | ICD-10-CM | POA: Diagnosis not present

## 2015-06-04 DIAGNOSIS — N2581 Secondary hyperparathyroidism of renal origin: Secondary | ICD-10-CM | POA: Diagnosis not present

## 2015-06-04 DIAGNOSIS — N186 End stage renal disease: Secondary | ICD-10-CM | POA: Diagnosis not present

## 2015-06-04 DIAGNOSIS — D631 Anemia in chronic kidney disease: Secondary | ICD-10-CM | POA: Diagnosis not present

## 2015-06-07 DIAGNOSIS — N186 End stage renal disease: Secondary | ICD-10-CM | POA: Diagnosis not present

## 2015-06-07 DIAGNOSIS — D631 Anemia in chronic kidney disease: Secondary | ICD-10-CM | POA: Diagnosis not present

## 2015-06-07 DIAGNOSIS — D509 Iron deficiency anemia, unspecified: Secondary | ICD-10-CM | POA: Diagnosis not present

## 2015-06-07 DIAGNOSIS — N2581 Secondary hyperparathyroidism of renal origin: Secondary | ICD-10-CM | POA: Diagnosis not present

## 2015-06-09 DIAGNOSIS — N2581 Secondary hyperparathyroidism of renal origin: Secondary | ICD-10-CM | POA: Diagnosis not present

## 2015-06-09 DIAGNOSIS — D631 Anemia in chronic kidney disease: Secondary | ICD-10-CM | POA: Diagnosis not present

## 2015-06-09 DIAGNOSIS — D509 Iron deficiency anemia, unspecified: Secondary | ICD-10-CM | POA: Diagnosis not present

## 2015-06-09 DIAGNOSIS — N186 End stage renal disease: Secondary | ICD-10-CM | POA: Diagnosis not present

## 2015-06-10 DIAGNOSIS — Z87891 Personal history of nicotine dependence: Secondary | ICD-10-CM | POA: Diagnosis not present

## 2015-06-10 DIAGNOSIS — N509 Disorder of male genital organs, unspecified: Secondary | ICD-10-CM | POA: Diagnosis not present

## 2015-06-10 DIAGNOSIS — K668 Other specified disorders of peritoneum: Secondary | ICD-10-CM | POA: Diagnosis not present

## 2015-06-10 DIAGNOSIS — N529 Male erectile dysfunction, unspecified: Secondary | ICD-10-CM | POA: Diagnosis present

## 2015-06-10 DIAGNOSIS — D649 Anemia, unspecified: Secondary | ICD-10-CM | POA: Diagnosis present

## 2015-06-10 DIAGNOSIS — I120 Hypertensive chronic kidney disease with stage 5 chronic kidney disease or end stage renal disease: Secondary | ICD-10-CM | POA: Diagnosis present

## 2015-06-10 DIAGNOSIS — E871 Hypo-osmolality and hyponatremia: Secondary | ICD-10-CM | POA: Diagnosis not present

## 2015-06-10 DIAGNOSIS — E875 Hyperkalemia: Secondary | ICD-10-CM | POA: Diagnosis not present

## 2015-06-10 DIAGNOSIS — K5909 Other constipation: Secondary | ICD-10-CM | POA: Diagnosis present

## 2015-06-10 DIAGNOSIS — E878 Other disorders of electrolyte and fluid balance, not elsewhere classified: Secondary | ICD-10-CM | POA: Diagnosis not present

## 2015-06-10 DIAGNOSIS — C61 Malignant neoplasm of prostate: Secondary | ICD-10-CM | POA: Diagnosis not present

## 2015-06-10 DIAGNOSIS — N186 End stage renal disease: Secondary | ICD-10-CM | POA: Diagnosis present

## 2015-06-10 DIAGNOSIS — Z79899 Other long term (current) drug therapy: Secondary | ICD-10-CM | POA: Diagnosis not present

## 2015-06-10 DIAGNOSIS — D696 Thrombocytopenia, unspecified: Secondary | ICD-10-CM | POA: Diagnosis present

## 2015-06-10 DIAGNOSIS — Z992 Dependence on renal dialysis: Secondary | ICD-10-CM | POA: Diagnosis not present

## 2015-06-17 DIAGNOSIS — D631 Anemia in chronic kidney disease: Secondary | ICD-10-CM | POA: Diagnosis not present

## 2015-06-17 DIAGNOSIS — D509 Iron deficiency anemia, unspecified: Secondary | ICD-10-CM | POA: Diagnosis not present

## 2015-06-17 DIAGNOSIS — N2581 Secondary hyperparathyroidism of renal origin: Secondary | ICD-10-CM | POA: Diagnosis not present

## 2015-06-17 DIAGNOSIS — N186 End stage renal disease: Secondary | ICD-10-CM | POA: Diagnosis not present

## 2015-06-19 DIAGNOSIS — N2581 Secondary hyperparathyroidism of renal origin: Secondary | ICD-10-CM | POA: Diagnosis not present

## 2015-06-19 DIAGNOSIS — D631 Anemia in chronic kidney disease: Secondary | ICD-10-CM | POA: Diagnosis not present

## 2015-06-19 DIAGNOSIS — N186 End stage renal disease: Secondary | ICD-10-CM | POA: Diagnosis not present

## 2015-06-19 DIAGNOSIS — D509 Iron deficiency anemia, unspecified: Secondary | ICD-10-CM | POA: Diagnosis not present

## 2015-06-21 DIAGNOSIS — N2581 Secondary hyperparathyroidism of renal origin: Secondary | ICD-10-CM | POA: Diagnosis not present

## 2015-06-21 DIAGNOSIS — D631 Anemia in chronic kidney disease: Secondary | ICD-10-CM | POA: Diagnosis not present

## 2015-06-21 DIAGNOSIS — N186 End stage renal disease: Secondary | ICD-10-CM | POA: Diagnosis not present

## 2015-06-21 DIAGNOSIS — D509 Iron deficiency anemia, unspecified: Secondary | ICD-10-CM | POA: Diagnosis not present

## 2015-06-22 DIAGNOSIS — N186 End stage renal disease: Secondary | ICD-10-CM | POA: Diagnosis not present

## 2015-06-22 DIAGNOSIS — Z992 Dependence on renal dialysis: Secondary | ICD-10-CM | POA: Diagnosis not present

## 2015-06-22 DIAGNOSIS — I120 Hypertensive chronic kidney disease with stage 5 chronic kidney disease or end stage renal disease: Secondary | ICD-10-CM | POA: Diagnosis not present

## 2015-06-23 DIAGNOSIS — D509 Iron deficiency anemia, unspecified: Secondary | ICD-10-CM | POA: Diagnosis not present

## 2015-06-23 DIAGNOSIS — D631 Anemia in chronic kidney disease: Secondary | ICD-10-CM | POA: Diagnosis not present

## 2015-06-23 DIAGNOSIS — N186 End stage renal disease: Secondary | ICD-10-CM | POA: Diagnosis not present

## 2015-06-23 DIAGNOSIS — N2581 Secondary hyperparathyroidism of renal origin: Secondary | ICD-10-CM | POA: Diagnosis not present

## 2015-07-20 DIAGNOSIS — T82858D Stenosis of vascular prosthetic devices, implants and grafts, subsequent encounter: Secondary | ICD-10-CM | POA: Diagnosis not present

## 2015-07-20 DIAGNOSIS — I771 Stricture of artery: Secondary | ICD-10-CM | POA: Diagnosis not present

## 2015-07-20 DIAGNOSIS — Z992 Dependence on renal dialysis: Secondary | ICD-10-CM | POA: Diagnosis not present

## 2015-07-20 DIAGNOSIS — N186 End stage renal disease: Secondary | ICD-10-CM | POA: Diagnosis not present

## 2015-07-23 DIAGNOSIS — I120 Hypertensive chronic kidney disease with stage 5 chronic kidney disease or end stage renal disease: Secondary | ICD-10-CM | POA: Diagnosis not present

## 2015-07-23 DIAGNOSIS — N186 End stage renal disease: Secondary | ICD-10-CM | POA: Diagnosis not present

## 2015-07-23 DIAGNOSIS — Z992 Dependence on renal dialysis: Secondary | ICD-10-CM | POA: Diagnosis not present

## 2015-07-24 DIAGNOSIS — C801 Malignant (primary) neoplasm, unspecified: Secondary | ICD-10-CM

## 2015-07-24 HISTORY — DX: Malignant (primary) neoplasm, unspecified: C80.1

## 2015-07-26 DIAGNOSIS — N186 End stage renal disease: Secondary | ICD-10-CM | POA: Diagnosis not present

## 2015-07-26 DIAGNOSIS — N2581 Secondary hyperparathyroidism of renal origin: Secondary | ICD-10-CM | POA: Diagnosis not present

## 2015-07-26 DIAGNOSIS — D631 Anemia in chronic kidney disease: Secondary | ICD-10-CM | POA: Diagnosis not present

## 2015-07-26 DIAGNOSIS — D509 Iron deficiency anemia, unspecified: Secondary | ICD-10-CM | POA: Diagnosis not present

## 2015-07-28 DIAGNOSIS — Z9889 Other specified postprocedural states: Secondary | ICD-10-CM | POA: Diagnosis not present

## 2015-07-28 DIAGNOSIS — Z87891 Personal history of nicotine dependence: Secondary | ICD-10-CM | POA: Diagnosis not present

## 2015-07-28 DIAGNOSIS — C61 Malignant neoplasm of prostate: Secondary | ICD-10-CM | POA: Diagnosis not present

## 2015-07-28 DIAGNOSIS — Z9079 Acquired absence of other genital organ(s): Secondary | ICD-10-CM | POA: Diagnosis not present

## 2015-07-28 DIAGNOSIS — Z79899 Other long term (current) drug therapy: Secondary | ICD-10-CM | POA: Diagnosis not present

## 2015-08-23 DIAGNOSIS — I120 Hypertensive chronic kidney disease with stage 5 chronic kidney disease or end stage renal disease: Secondary | ICD-10-CM | POA: Diagnosis not present

## 2015-08-23 DIAGNOSIS — Z992 Dependence on renal dialysis: Secondary | ICD-10-CM | POA: Diagnosis not present

## 2015-08-23 DIAGNOSIS — N186 End stage renal disease: Secondary | ICD-10-CM | POA: Diagnosis not present

## 2015-08-25 DIAGNOSIS — N2581 Secondary hyperparathyroidism of renal origin: Secondary | ICD-10-CM | POA: Diagnosis not present

## 2015-08-25 DIAGNOSIS — D631 Anemia in chronic kidney disease: Secondary | ICD-10-CM | POA: Diagnosis not present

## 2015-08-25 DIAGNOSIS — N186 End stage renal disease: Secondary | ICD-10-CM | POA: Diagnosis not present

## 2015-09-19 DIAGNOSIS — N529 Male erectile dysfunction, unspecified: Secondary | ICD-10-CM | POA: Diagnosis not present

## 2015-09-19 DIAGNOSIS — I10 Essential (primary) hypertension: Secondary | ICD-10-CM | POA: Diagnosis not present

## 2015-09-20 DIAGNOSIS — Z992 Dependence on renal dialysis: Secondary | ICD-10-CM | POA: Diagnosis not present

## 2015-09-20 DIAGNOSIS — I120 Hypertensive chronic kidney disease with stage 5 chronic kidney disease or end stage renal disease: Secondary | ICD-10-CM | POA: Diagnosis not present

## 2015-09-20 DIAGNOSIS — N186 End stage renal disease: Secondary | ICD-10-CM | POA: Diagnosis not present

## 2015-09-22 DIAGNOSIS — D631 Anemia in chronic kidney disease: Secondary | ICD-10-CM | POA: Diagnosis not present

## 2015-09-22 DIAGNOSIS — D509 Iron deficiency anemia, unspecified: Secondary | ICD-10-CM | POA: Diagnosis not present

## 2015-09-22 DIAGNOSIS — N2581 Secondary hyperparathyroidism of renal origin: Secondary | ICD-10-CM | POA: Diagnosis not present

## 2015-09-22 DIAGNOSIS — N186 End stage renal disease: Secondary | ICD-10-CM | POA: Diagnosis not present

## 2015-10-20 DIAGNOSIS — I10 Essential (primary) hypertension: Secondary | ICD-10-CM | POA: Diagnosis not present

## 2015-10-20 DIAGNOSIS — N529 Male erectile dysfunction, unspecified: Secondary | ICD-10-CM | POA: Diagnosis not present

## 2015-10-21 DIAGNOSIS — Z992 Dependence on renal dialysis: Secondary | ICD-10-CM | POA: Diagnosis not present

## 2015-10-21 DIAGNOSIS — I120 Hypertensive chronic kidney disease with stage 5 chronic kidney disease or end stage renal disease: Secondary | ICD-10-CM | POA: Diagnosis not present

## 2015-10-21 DIAGNOSIS — N186 End stage renal disease: Secondary | ICD-10-CM | POA: Diagnosis not present

## 2015-10-22 DIAGNOSIS — N2581 Secondary hyperparathyroidism of renal origin: Secondary | ICD-10-CM | POA: Diagnosis not present

## 2015-10-22 DIAGNOSIS — N186 End stage renal disease: Secondary | ICD-10-CM | POA: Diagnosis not present

## 2015-10-22 DIAGNOSIS — D631 Anemia in chronic kidney disease: Secondary | ICD-10-CM | POA: Diagnosis not present

## 2015-11-03 DIAGNOSIS — N529 Male erectile dysfunction, unspecified: Secondary | ICD-10-CM | POA: Diagnosis not present

## 2015-11-03 DIAGNOSIS — I10 Essential (primary) hypertension: Secondary | ICD-10-CM | POA: Diagnosis not present

## 2015-11-20 DIAGNOSIS — N186 End stage renal disease: Secondary | ICD-10-CM | POA: Diagnosis not present

## 2015-11-20 DIAGNOSIS — Z992 Dependence on renal dialysis: Secondary | ICD-10-CM | POA: Diagnosis not present

## 2015-11-20 DIAGNOSIS — I120 Hypertensive chronic kidney disease with stage 5 chronic kidney disease or end stage renal disease: Secondary | ICD-10-CM | POA: Diagnosis not present

## 2015-11-22 DIAGNOSIS — Z23 Encounter for immunization: Secondary | ICD-10-CM | POA: Diagnosis not present

## 2015-11-22 DIAGNOSIS — N186 End stage renal disease: Secondary | ICD-10-CM | POA: Diagnosis not present

## 2015-11-22 DIAGNOSIS — D509 Iron deficiency anemia, unspecified: Secondary | ICD-10-CM | POA: Diagnosis not present

## 2015-11-22 DIAGNOSIS — N2581 Secondary hyperparathyroidism of renal origin: Secondary | ICD-10-CM | POA: Diagnosis not present

## 2015-11-22 DIAGNOSIS — D631 Anemia in chronic kidney disease: Secondary | ICD-10-CM | POA: Diagnosis not present

## 2015-11-24 DIAGNOSIS — N186 End stage renal disease: Secondary | ICD-10-CM | POA: Diagnosis not present

## 2015-11-24 DIAGNOSIS — N2581 Secondary hyperparathyroidism of renal origin: Secondary | ICD-10-CM | POA: Diagnosis not present

## 2015-11-24 DIAGNOSIS — D509 Iron deficiency anemia, unspecified: Secondary | ICD-10-CM | POA: Diagnosis not present

## 2015-11-24 DIAGNOSIS — D631 Anemia in chronic kidney disease: Secondary | ICD-10-CM | POA: Diagnosis not present

## 2015-11-24 DIAGNOSIS — Z23 Encounter for immunization: Secondary | ICD-10-CM | POA: Diagnosis not present

## 2015-11-26 DIAGNOSIS — N186 End stage renal disease: Secondary | ICD-10-CM | POA: Diagnosis not present

## 2015-11-26 DIAGNOSIS — Z23 Encounter for immunization: Secondary | ICD-10-CM | POA: Diagnosis not present

## 2015-11-26 DIAGNOSIS — N2581 Secondary hyperparathyroidism of renal origin: Secondary | ICD-10-CM | POA: Diagnosis not present

## 2015-11-26 DIAGNOSIS — D509 Iron deficiency anemia, unspecified: Secondary | ICD-10-CM | POA: Diagnosis not present

## 2015-11-26 DIAGNOSIS — D631 Anemia in chronic kidney disease: Secondary | ICD-10-CM | POA: Diagnosis not present

## 2015-11-29 DIAGNOSIS — Z23 Encounter for immunization: Secondary | ICD-10-CM | POA: Diagnosis not present

## 2015-11-29 DIAGNOSIS — D509 Iron deficiency anemia, unspecified: Secondary | ICD-10-CM | POA: Diagnosis not present

## 2015-11-29 DIAGNOSIS — N2581 Secondary hyperparathyroidism of renal origin: Secondary | ICD-10-CM | POA: Diagnosis not present

## 2015-11-29 DIAGNOSIS — D631 Anemia in chronic kidney disease: Secondary | ICD-10-CM | POA: Diagnosis not present

## 2015-11-29 DIAGNOSIS — N186 End stage renal disease: Secondary | ICD-10-CM | POA: Diagnosis not present

## 2015-12-01 DIAGNOSIS — D509 Iron deficiency anemia, unspecified: Secondary | ICD-10-CM | POA: Diagnosis not present

## 2015-12-01 DIAGNOSIS — N529 Male erectile dysfunction, unspecified: Secondary | ICD-10-CM | POA: Diagnosis not present

## 2015-12-01 DIAGNOSIS — N186 End stage renal disease: Secondary | ICD-10-CM | POA: Diagnosis not present

## 2015-12-01 DIAGNOSIS — I10 Essential (primary) hypertension: Secondary | ICD-10-CM | POA: Diagnosis not present

## 2015-12-01 DIAGNOSIS — N2581 Secondary hyperparathyroidism of renal origin: Secondary | ICD-10-CM | POA: Diagnosis not present

## 2015-12-01 DIAGNOSIS — D631 Anemia in chronic kidney disease: Secondary | ICD-10-CM | POA: Diagnosis not present

## 2015-12-01 DIAGNOSIS — Z23 Encounter for immunization: Secondary | ICD-10-CM | POA: Diagnosis not present

## 2015-12-03 DIAGNOSIS — N2581 Secondary hyperparathyroidism of renal origin: Secondary | ICD-10-CM | POA: Diagnosis not present

## 2015-12-03 DIAGNOSIS — D631 Anemia in chronic kidney disease: Secondary | ICD-10-CM | POA: Diagnosis not present

## 2015-12-03 DIAGNOSIS — N186 End stage renal disease: Secondary | ICD-10-CM | POA: Diagnosis not present

## 2015-12-03 DIAGNOSIS — D509 Iron deficiency anemia, unspecified: Secondary | ICD-10-CM | POA: Diagnosis not present

## 2015-12-03 DIAGNOSIS — Z23 Encounter for immunization: Secondary | ICD-10-CM | POA: Diagnosis not present

## 2015-12-06 DIAGNOSIS — D631 Anemia in chronic kidney disease: Secondary | ICD-10-CM | POA: Diagnosis not present

## 2015-12-06 DIAGNOSIS — D509 Iron deficiency anemia, unspecified: Secondary | ICD-10-CM | POA: Diagnosis not present

## 2015-12-06 DIAGNOSIS — Z23 Encounter for immunization: Secondary | ICD-10-CM | POA: Diagnosis not present

## 2015-12-06 DIAGNOSIS — N2581 Secondary hyperparathyroidism of renal origin: Secondary | ICD-10-CM | POA: Diagnosis not present

## 2015-12-06 DIAGNOSIS — N186 End stage renal disease: Secondary | ICD-10-CM | POA: Diagnosis not present

## 2015-12-08 DIAGNOSIS — D631 Anemia in chronic kidney disease: Secondary | ICD-10-CM | POA: Diagnosis not present

## 2015-12-08 DIAGNOSIS — Z23 Encounter for immunization: Secondary | ICD-10-CM | POA: Diagnosis not present

## 2015-12-08 DIAGNOSIS — N2581 Secondary hyperparathyroidism of renal origin: Secondary | ICD-10-CM | POA: Diagnosis not present

## 2015-12-08 DIAGNOSIS — N186 End stage renal disease: Secondary | ICD-10-CM | POA: Diagnosis not present

## 2015-12-08 DIAGNOSIS — D509 Iron deficiency anemia, unspecified: Secondary | ICD-10-CM | POA: Diagnosis not present

## 2015-12-10 DIAGNOSIS — N2581 Secondary hyperparathyroidism of renal origin: Secondary | ICD-10-CM | POA: Diagnosis not present

## 2015-12-10 DIAGNOSIS — D509 Iron deficiency anemia, unspecified: Secondary | ICD-10-CM | POA: Diagnosis not present

## 2015-12-10 DIAGNOSIS — N186 End stage renal disease: Secondary | ICD-10-CM | POA: Diagnosis not present

## 2015-12-10 DIAGNOSIS — Z23 Encounter for immunization: Secondary | ICD-10-CM | POA: Diagnosis not present

## 2015-12-10 DIAGNOSIS — D631 Anemia in chronic kidney disease: Secondary | ICD-10-CM | POA: Diagnosis not present

## 2015-12-13 DIAGNOSIS — D631 Anemia in chronic kidney disease: Secondary | ICD-10-CM | POA: Diagnosis not present

## 2015-12-13 DIAGNOSIS — D509 Iron deficiency anemia, unspecified: Secondary | ICD-10-CM | POA: Diagnosis not present

## 2015-12-13 DIAGNOSIS — Z23 Encounter for immunization: Secondary | ICD-10-CM | POA: Diagnosis not present

## 2015-12-13 DIAGNOSIS — N186 End stage renal disease: Secondary | ICD-10-CM | POA: Diagnosis not present

## 2015-12-13 DIAGNOSIS — N2581 Secondary hyperparathyroidism of renal origin: Secondary | ICD-10-CM | POA: Diagnosis not present

## 2015-12-15 DIAGNOSIS — N2581 Secondary hyperparathyroidism of renal origin: Secondary | ICD-10-CM | POA: Diagnosis not present

## 2015-12-15 DIAGNOSIS — Z23 Encounter for immunization: Secondary | ICD-10-CM | POA: Diagnosis not present

## 2015-12-15 DIAGNOSIS — D509 Iron deficiency anemia, unspecified: Secondary | ICD-10-CM | POA: Diagnosis not present

## 2015-12-15 DIAGNOSIS — N186 End stage renal disease: Secondary | ICD-10-CM | POA: Diagnosis not present

## 2015-12-15 DIAGNOSIS — D631 Anemia in chronic kidney disease: Secondary | ICD-10-CM | POA: Diagnosis not present

## 2015-12-17 DIAGNOSIS — Z23 Encounter for immunization: Secondary | ICD-10-CM | POA: Diagnosis not present

## 2015-12-17 DIAGNOSIS — D509 Iron deficiency anemia, unspecified: Secondary | ICD-10-CM | POA: Diagnosis not present

## 2015-12-17 DIAGNOSIS — D631 Anemia in chronic kidney disease: Secondary | ICD-10-CM | POA: Diagnosis not present

## 2015-12-17 DIAGNOSIS — N2581 Secondary hyperparathyroidism of renal origin: Secondary | ICD-10-CM | POA: Diagnosis not present

## 2015-12-17 DIAGNOSIS — N186 End stage renal disease: Secondary | ICD-10-CM | POA: Diagnosis not present

## 2015-12-20 DIAGNOSIS — D509 Iron deficiency anemia, unspecified: Secondary | ICD-10-CM | POA: Diagnosis not present

## 2015-12-20 DIAGNOSIS — N186 End stage renal disease: Secondary | ICD-10-CM | POA: Diagnosis not present

## 2015-12-20 DIAGNOSIS — Z23 Encounter for immunization: Secondary | ICD-10-CM | POA: Diagnosis not present

## 2015-12-20 DIAGNOSIS — D631 Anemia in chronic kidney disease: Secondary | ICD-10-CM | POA: Diagnosis not present

## 2015-12-20 DIAGNOSIS — N2581 Secondary hyperparathyroidism of renal origin: Secondary | ICD-10-CM | POA: Diagnosis not present

## 2015-12-21 DIAGNOSIS — N186 End stage renal disease: Secondary | ICD-10-CM | POA: Diagnosis not present

## 2015-12-21 DIAGNOSIS — I120 Hypertensive chronic kidney disease with stage 5 chronic kidney disease or end stage renal disease: Secondary | ICD-10-CM | POA: Diagnosis not present

## 2015-12-21 DIAGNOSIS — Z992 Dependence on renal dialysis: Secondary | ICD-10-CM | POA: Diagnosis not present

## 2015-12-22 DIAGNOSIS — N186 End stage renal disease: Secondary | ICD-10-CM | POA: Diagnosis not present

## 2015-12-22 DIAGNOSIS — D631 Anemia in chronic kidney disease: Secondary | ICD-10-CM | POA: Diagnosis not present

## 2015-12-22 DIAGNOSIS — Z23 Encounter for immunization: Secondary | ICD-10-CM | POA: Diagnosis not present

## 2015-12-22 DIAGNOSIS — D509 Iron deficiency anemia, unspecified: Secondary | ICD-10-CM | POA: Diagnosis not present

## 2015-12-22 DIAGNOSIS — N2581 Secondary hyperparathyroidism of renal origin: Secondary | ICD-10-CM | POA: Diagnosis not present

## 2016-01-03 DIAGNOSIS — I10 Essential (primary) hypertension: Secondary | ICD-10-CM | POA: Diagnosis not present

## 2016-01-03 DIAGNOSIS — N529 Male erectile dysfunction, unspecified: Secondary | ICD-10-CM | POA: Diagnosis not present

## 2016-01-20 DIAGNOSIS — Z992 Dependence on renal dialysis: Secondary | ICD-10-CM | POA: Diagnosis not present

## 2016-01-20 DIAGNOSIS — N186 End stage renal disease: Secondary | ICD-10-CM | POA: Diagnosis not present

## 2016-01-20 DIAGNOSIS — I120 Hypertensive chronic kidney disease with stage 5 chronic kidney disease or end stage renal disease: Secondary | ICD-10-CM | POA: Diagnosis not present

## 2016-01-21 DIAGNOSIS — D631 Anemia in chronic kidney disease: Secondary | ICD-10-CM | POA: Diagnosis not present

## 2016-01-21 DIAGNOSIS — N186 End stage renal disease: Secondary | ICD-10-CM | POA: Diagnosis not present

## 2016-01-21 DIAGNOSIS — Z23 Encounter for immunization: Secondary | ICD-10-CM | POA: Diagnosis not present

## 2016-01-21 DIAGNOSIS — D509 Iron deficiency anemia, unspecified: Secondary | ICD-10-CM | POA: Diagnosis not present

## 2016-01-21 DIAGNOSIS — N2581 Secondary hyperparathyroidism of renal origin: Secondary | ICD-10-CM | POA: Diagnosis not present

## 2016-01-26 DIAGNOSIS — C61 Malignant neoplasm of prostate: Secondary | ICD-10-CM | POA: Diagnosis not present

## 2016-02-07 DIAGNOSIS — N529 Male erectile dysfunction, unspecified: Secondary | ICD-10-CM | POA: Diagnosis not present

## 2016-02-07 DIAGNOSIS — I10 Essential (primary) hypertension: Secondary | ICD-10-CM | POA: Diagnosis not present

## 2016-02-20 DIAGNOSIS — N186 End stage renal disease: Secondary | ICD-10-CM | POA: Diagnosis not present

## 2016-02-20 DIAGNOSIS — I120 Hypertensive chronic kidney disease with stage 5 chronic kidney disease or end stage renal disease: Secondary | ICD-10-CM | POA: Diagnosis not present

## 2016-02-20 DIAGNOSIS — Z992 Dependence on renal dialysis: Secondary | ICD-10-CM | POA: Diagnosis not present

## 2016-02-21 DIAGNOSIS — N2581 Secondary hyperparathyroidism of renal origin: Secondary | ICD-10-CM | POA: Diagnosis not present

## 2016-02-21 DIAGNOSIS — D631 Anemia in chronic kidney disease: Secondary | ICD-10-CM | POA: Diagnosis not present

## 2016-02-21 DIAGNOSIS — N186 End stage renal disease: Secondary | ICD-10-CM | POA: Diagnosis not present

## 2016-02-21 DIAGNOSIS — D509 Iron deficiency anemia, unspecified: Secondary | ICD-10-CM | POA: Diagnosis not present

## 2016-02-23 DIAGNOSIS — N186 End stage renal disease: Secondary | ICD-10-CM | POA: Diagnosis not present

## 2016-02-23 DIAGNOSIS — N2581 Secondary hyperparathyroidism of renal origin: Secondary | ICD-10-CM | POA: Diagnosis not present

## 2016-02-23 DIAGNOSIS — D509 Iron deficiency anemia, unspecified: Secondary | ICD-10-CM | POA: Diagnosis not present

## 2016-02-23 DIAGNOSIS — D631 Anemia in chronic kidney disease: Secondary | ICD-10-CM | POA: Diagnosis not present

## 2016-02-25 DIAGNOSIS — D509 Iron deficiency anemia, unspecified: Secondary | ICD-10-CM | POA: Diagnosis not present

## 2016-02-25 DIAGNOSIS — D631 Anemia in chronic kidney disease: Secondary | ICD-10-CM | POA: Diagnosis not present

## 2016-02-25 DIAGNOSIS — N186 End stage renal disease: Secondary | ICD-10-CM | POA: Diagnosis not present

## 2016-02-25 DIAGNOSIS — N2581 Secondary hyperparathyroidism of renal origin: Secondary | ICD-10-CM | POA: Diagnosis not present

## 2016-02-28 DIAGNOSIS — N2581 Secondary hyperparathyroidism of renal origin: Secondary | ICD-10-CM | POA: Diagnosis not present

## 2016-02-28 DIAGNOSIS — D509 Iron deficiency anemia, unspecified: Secondary | ICD-10-CM | POA: Diagnosis not present

## 2016-02-28 DIAGNOSIS — N186 End stage renal disease: Secondary | ICD-10-CM | POA: Diagnosis not present

## 2016-02-28 DIAGNOSIS — D631 Anemia in chronic kidney disease: Secondary | ICD-10-CM | POA: Diagnosis not present

## 2016-02-29 DIAGNOSIS — I10 Essential (primary) hypertension: Secondary | ICD-10-CM | POA: Diagnosis not present

## 2016-02-29 DIAGNOSIS — N529 Male erectile dysfunction, unspecified: Secondary | ICD-10-CM | POA: Diagnosis not present

## 2016-03-01 DIAGNOSIS — N186 End stage renal disease: Secondary | ICD-10-CM | POA: Diagnosis not present

## 2016-03-01 DIAGNOSIS — D631 Anemia in chronic kidney disease: Secondary | ICD-10-CM | POA: Diagnosis not present

## 2016-03-01 DIAGNOSIS — D509 Iron deficiency anemia, unspecified: Secondary | ICD-10-CM | POA: Diagnosis not present

## 2016-03-01 DIAGNOSIS — N2581 Secondary hyperparathyroidism of renal origin: Secondary | ICD-10-CM | POA: Diagnosis not present

## 2016-03-03 DIAGNOSIS — N186 End stage renal disease: Secondary | ICD-10-CM | POA: Diagnosis not present

## 2016-03-03 DIAGNOSIS — D631 Anemia in chronic kidney disease: Secondary | ICD-10-CM | POA: Diagnosis not present

## 2016-03-03 DIAGNOSIS — N2581 Secondary hyperparathyroidism of renal origin: Secondary | ICD-10-CM | POA: Diagnosis not present

## 2016-03-03 DIAGNOSIS — D509 Iron deficiency anemia, unspecified: Secondary | ICD-10-CM | POA: Diagnosis not present

## 2016-03-06 DIAGNOSIS — N186 End stage renal disease: Secondary | ICD-10-CM | POA: Diagnosis not present

## 2016-03-06 DIAGNOSIS — D631 Anemia in chronic kidney disease: Secondary | ICD-10-CM | POA: Diagnosis not present

## 2016-03-06 DIAGNOSIS — N2581 Secondary hyperparathyroidism of renal origin: Secondary | ICD-10-CM | POA: Diagnosis not present

## 2016-03-06 DIAGNOSIS — D509 Iron deficiency anemia, unspecified: Secondary | ICD-10-CM | POA: Diagnosis not present

## 2016-03-08 DIAGNOSIS — D631 Anemia in chronic kidney disease: Secondary | ICD-10-CM | POA: Diagnosis not present

## 2016-03-08 DIAGNOSIS — N186 End stage renal disease: Secondary | ICD-10-CM | POA: Diagnosis not present

## 2016-03-08 DIAGNOSIS — N2581 Secondary hyperparathyroidism of renal origin: Secondary | ICD-10-CM | POA: Diagnosis not present

## 2016-03-08 DIAGNOSIS — D509 Iron deficiency anemia, unspecified: Secondary | ICD-10-CM | POA: Diagnosis not present

## 2016-03-10 DIAGNOSIS — D509 Iron deficiency anemia, unspecified: Secondary | ICD-10-CM | POA: Diagnosis not present

## 2016-03-10 DIAGNOSIS — D631 Anemia in chronic kidney disease: Secondary | ICD-10-CM | POA: Diagnosis not present

## 2016-03-10 DIAGNOSIS — N2581 Secondary hyperparathyroidism of renal origin: Secondary | ICD-10-CM | POA: Diagnosis not present

## 2016-03-10 DIAGNOSIS — N186 End stage renal disease: Secondary | ICD-10-CM | POA: Diagnosis not present

## 2016-03-13 DIAGNOSIS — D509 Iron deficiency anemia, unspecified: Secondary | ICD-10-CM | POA: Diagnosis not present

## 2016-03-13 DIAGNOSIS — N186 End stage renal disease: Secondary | ICD-10-CM | POA: Diagnosis not present

## 2016-03-13 DIAGNOSIS — N2581 Secondary hyperparathyroidism of renal origin: Secondary | ICD-10-CM | POA: Diagnosis not present

## 2016-03-13 DIAGNOSIS — D631 Anemia in chronic kidney disease: Secondary | ICD-10-CM | POA: Diagnosis not present

## 2016-03-15 DIAGNOSIS — D509 Iron deficiency anemia, unspecified: Secondary | ICD-10-CM | POA: Diagnosis not present

## 2016-03-15 DIAGNOSIS — N186 End stage renal disease: Secondary | ICD-10-CM | POA: Diagnosis not present

## 2016-03-15 DIAGNOSIS — D631 Anemia in chronic kidney disease: Secondary | ICD-10-CM | POA: Diagnosis not present

## 2016-03-15 DIAGNOSIS — N2581 Secondary hyperparathyroidism of renal origin: Secondary | ICD-10-CM | POA: Diagnosis not present

## 2016-03-17 DIAGNOSIS — N186 End stage renal disease: Secondary | ICD-10-CM | POA: Diagnosis not present

## 2016-03-17 DIAGNOSIS — N2581 Secondary hyperparathyroidism of renal origin: Secondary | ICD-10-CM | POA: Diagnosis not present

## 2016-03-17 DIAGNOSIS — D509 Iron deficiency anemia, unspecified: Secondary | ICD-10-CM | POA: Diagnosis not present

## 2016-03-17 DIAGNOSIS — D631 Anemia in chronic kidney disease: Secondary | ICD-10-CM | POA: Diagnosis not present

## 2016-03-20 DIAGNOSIS — D509 Iron deficiency anemia, unspecified: Secondary | ICD-10-CM | POA: Diagnosis not present

## 2016-03-20 DIAGNOSIS — N186 End stage renal disease: Secondary | ICD-10-CM | POA: Diagnosis not present

## 2016-03-20 DIAGNOSIS — N2581 Secondary hyperparathyroidism of renal origin: Secondary | ICD-10-CM | POA: Diagnosis not present

## 2016-03-20 DIAGNOSIS — D631 Anemia in chronic kidney disease: Secondary | ICD-10-CM | POA: Diagnosis not present

## 2016-03-22 DIAGNOSIS — D631 Anemia in chronic kidney disease: Secondary | ICD-10-CM | POA: Diagnosis not present

## 2016-03-22 DIAGNOSIS — N186 End stage renal disease: Secondary | ICD-10-CM | POA: Diagnosis not present

## 2016-03-22 DIAGNOSIS — Z992 Dependence on renal dialysis: Secondary | ICD-10-CM | POA: Diagnosis not present

## 2016-03-22 DIAGNOSIS — I120 Hypertensive chronic kidney disease with stage 5 chronic kidney disease or end stage renal disease: Secondary | ICD-10-CM | POA: Diagnosis not present

## 2016-03-22 DIAGNOSIS — N2581 Secondary hyperparathyroidism of renal origin: Secondary | ICD-10-CM | POA: Diagnosis not present

## 2016-03-22 DIAGNOSIS — D509 Iron deficiency anemia, unspecified: Secondary | ICD-10-CM | POA: Diagnosis not present

## 2016-03-24 DIAGNOSIS — N186 End stage renal disease: Secondary | ICD-10-CM | POA: Diagnosis not present

## 2016-03-24 DIAGNOSIS — D631 Anemia in chronic kidney disease: Secondary | ICD-10-CM | POA: Diagnosis not present

## 2016-03-24 DIAGNOSIS — D509 Iron deficiency anemia, unspecified: Secondary | ICD-10-CM | POA: Diagnosis not present

## 2016-03-24 DIAGNOSIS — N2581 Secondary hyperparathyroidism of renal origin: Secondary | ICD-10-CM | POA: Diagnosis not present

## 2016-03-27 DIAGNOSIS — N186 End stage renal disease: Secondary | ICD-10-CM | POA: Diagnosis not present

## 2016-03-27 DIAGNOSIS — N2581 Secondary hyperparathyroidism of renal origin: Secondary | ICD-10-CM | POA: Diagnosis not present

## 2016-03-27 DIAGNOSIS — D631 Anemia in chronic kidney disease: Secondary | ICD-10-CM | POA: Diagnosis not present

## 2016-03-27 DIAGNOSIS — D509 Iron deficiency anemia, unspecified: Secondary | ICD-10-CM | POA: Diagnosis not present

## 2016-03-29 DIAGNOSIS — N2581 Secondary hyperparathyroidism of renal origin: Secondary | ICD-10-CM | POA: Diagnosis not present

## 2016-03-29 DIAGNOSIS — D509 Iron deficiency anemia, unspecified: Secondary | ICD-10-CM | POA: Diagnosis not present

## 2016-03-29 DIAGNOSIS — N186 End stage renal disease: Secondary | ICD-10-CM | POA: Diagnosis not present

## 2016-03-29 DIAGNOSIS — D631 Anemia in chronic kidney disease: Secondary | ICD-10-CM | POA: Diagnosis not present

## 2016-03-31 DIAGNOSIS — N2581 Secondary hyperparathyroidism of renal origin: Secondary | ICD-10-CM | POA: Diagnosis not present

## 2016-03-31 DIAGNOSIS — N186 End stage renal disease: Secondary | ICD-10-CM | POA: Diagnosis not present

## 2016-03-31 DIAGNOSIS — D509 Iron deficiency anemia, unspecified: Secondary | ICD-10-CM | POA: Diagnosis not present

## 2016-03-31 DIAGNOSIS — D631 Anemia in chronic kidney disease: Secondary | ICD-10-CM | POA: Diagnosis not present

## 2016-04-03 DIAGNOSIS — N186 End stage renal disease: Secondary | ICD-10-CM | POA: Diagnosis not present

## 2016-04-03 DIAGNOSIS — D509 Iron deficiency anemia, unspecified: Secondary | ICD-10-CM | POA: Diagnosis not present

## 2016-04-03 DIAGNOSIS — N2581 Secondary hyperparathyroidism of renal origin: Secondary | ICD-10-CM | POA: Diagnosis not present

## 2016-04-03 DIAGNOSIS — D631 Anemia in chronic kidney disease: Secondary | ICD-10-CM | POA: Diagnosis not present

## 2016-04-05 DIAGNOSIS — N2581 Secondary hyperparathyroidism of renal origin: Secondary | ICD-10-CM | POA: Diagnosis not present

## 2016-04-05 DIAGNOSIS — D631 Anemia in chronic kidney disease: Secondary | ICD-10-CM | POA: Diagnosis not present

## 2016-04-05 DIAGNOSIS — N186 End stage renal disease: Secondary | ICD-10-CM | POA: Diagnosis not present

## 2016-04-05 DIAGNOSIS — D509 Iron deficiency anemia, unspecified: Secondary | ICD-10-CM | POA: Diagnosis not present

## 2016-04-07 DIAGNOSIS — N186 End stage renal disease: Secondary | ICD-10-CM | POA: Diagnosis not present

## 2016-04-07 DIAGNOSIS — D631 Anemia in chronic kidney disease: Secondary | ICD-10-CM | POA: Diagnosis not present

## 2016-04-07 DIAGNOSIS — D509 Iron deficiency anemia, unspecified: Secondary | ICD-10-CM | POA: Diagnosis not present

## 2016-04-07 DIAGNOSIS — N2581 Secondary hyperparathyroidism of renal origin: Secondary | ICD-10-CM | POA: Diagnosis not present

## 2016-04-10 DIAGNOSIS — D509 Iron deficiency anemia, unspecified: Secondary | ICD-10-CM | POA: Diagnosis not present

## 2016-04-10 DIAGNOSIS — N2581 Secondary hyperparathyroidism of renal origin: Secondary | ICD-10-CM | POA: Diagnosis not present

## 2016-04-10 DIAGNOSIS — D631 Anemia in chronic kidney disease: Secondary | ICD-10-CM | POA: Diagnosis not present

## 2016-04-10 DIAGNOSIS — N186 End stage renal disease: Secondary | ICD-10-CM | POA: Diagnosis not present

## 2016-04-12 DIAGNOSIS — N2581 Secondary hyperparathyroidism of renal origin: Secondary | ICD-10-CM | POA: Diagnosis not present

## 2016-04-12 DIAGNOSIS — D631 Anemia in chronic kidney disease: Secondary | ICD-10-CM | POA: Diagnosis not present

## 2016-04-12 DIAGNOSIS — D509 Iron deficiency anemia, unspecified: Secondary | ICD-10-CM | POA: Diagnosis not present

## 2016-04-12 DIAGNOSIS — N186 End stage renal disease: Secondary | ICD-10-CM | POA: Diagnosis not present

## 2016-04-14 DIAGNOSIS — D509 Iron deficiency anemia, unspecified: Secondary | ICD-10-CM | POA: Diagnosis not present

## 2016-04-14 DIAGNOSIS — N186 End stage renal disease: Secondary | ICD-10-CM | POA: Diagnosis not present

## 2016-04-14 DIAGNOSIS — N2581 Secondary hyperparathyroidism of renal origin: Secondary | ICD-10-CM | POA: Diagnosis not present

## 2016-04-14 DIAGNOSIS — D631 Anemia in chronic kidney disease: Secondary | ICD-10-CM | POA: Diagnosis not present

## 2016-04-17 DIAGNOSIS — N2581 Secondary hyperparathyroidism of renal origin: Secondary | ICD-10-CM | POA: Diagnosis not present

## 2016-04-17 DIAGNOSIS — D631 Anemia in chronic kidney disease: Secondary | ICD-10-CM | POA: Diagnosis not present

## 2016-04-17 DIAGNOSIS — D509 Iron deficiency anemia, unspecified: Secondary | ICD-10-CM | POA: Diagnosis not present

## 2016-04-17 DIAGNOSIS — N186 End stage renal disease: Secondary | ICD-10-CM | POA: Diagnosis not present

## 2016-04-19 DIAGNOSIS — D509 Iron deficiency anemia, unspecified: Secondary | ICD-10-CM | POA: Diagnosis not present

## 2016-04-19 DIAGNOSIS — I10 Essential (primary) hypertension: Secondary | ICD-10-CM | POA: Diagnosis not present

## 2016-04-19 DIAGNOSIS — N529 Male erectile dysfunction, unspecified: Secondary | ICD-10-CM | POA: Diagnosis not present

## 2016-04-19 DIAGNOSIS — N2581 Secondary hyperparathyroidism of renal origin: Secondary | ICD-10-CM | POA: Diagnosis not present

## 2016-04-19 DIAGNOSIS — N186 End stage renal disease: Secondary | ICD-10-CM | POA: Diagnosis not present

## 2016-04-19 DIAGNOSIS — D631 Anemia in chronic kidney disease: Secondary | ICD-10-CM | POA: Diagnosis not present

## 2016-04-21 DIAGNOSIS — I120 Hypertensive chronic kidney disease with stage 5 chronic kidney disease or end stage renal disease: Secondary | ICD-10-CM | POA: Diagnosis not present

## 2016-04-21 DIAGNOSIS — D631 Anemia in chronic kidney disease: Secondary | ICD-10-CM | POA: Diagnosis not present

## 2016-04-21 DIAGNOSIS — Z992 Dependence on renal dialysis: Secondary | ICD-10-CM | POA: Diagnosis not present

## 2016-04-21 DIAGNOSIS — N2581 Secondary hyperparathyroidism of renal origin: Secondary | ICD-10-CM | POA: Diagnosis not present

## 2016-04-21 DIAGNOSIS — D509 Iron deficiency anemia, unspecified: Secondary | ICD-10-CM | POA: Diagnosis not present

## 2016-04-21 DIAGNOSIS — N186 End stage renal disease: Secondary | ICD-10-CM | POA: Diagnosis not present

## 2016-04-23 DIAGNOSIS — N529 Male erectile dysfunction, unspecified: Secondary | ICD-10-CM | POA: Diagnosis not present

## 2016-04-23 DIAGNOSIS — I739 Peripheral vascular disease, unspecified: Secondary | ICD-10-CM | POA: Diagnosis not present

## 2016-04-23 DIAGNOSIS — R739 Hyperglycemia, unspecified: Secondary | ICD-10-CM | POA: Diagnosis not present

## 2016-04-23 DIAGNOSIS — K611 Rectal abscess: Secondary | ICD-10-CM | POA: Diagnosis not present

## 2016-04-23 DIAGNOSIS — I10 Essential (primary) hypertension: Secondary | ICD-10-CM | POA: Diagnosis not present

## 2016-04-23 DIAGNOSIS — N400 Enlarged prostate without lower urinary tract symptoms: Secondary | ICD-10-CM | POA: Diagnosis not present

## 2016-04-23 DIAGNOSIS — D638 Anemia in other chronic diseases classified elsewhere: Secondary | ICD-10-CM | POA: Diagnosis not present

## 2016-04-23 DIAGNOSIS — R636 Underweight: Secondary | ICD-10-CM | POA: Diagnosis not present

## 2016-04-23 DIAGNOSIS — N184 Chronic kidney disease, stage 4 (severe): Secondary | ICD-10-CM | POA: Diagnosis not present

## 2016-04-24 DIAGNOSIS — D631 Anemia in chronic kidney disease: Secondary | ICD-10-CM | POA: Diagnosis not present

## 2016-04-24 DIAGNOSIS — N186 End stage renal disease: Secondary | ICD-10-CM | POA: Diagnosis not present

## 2016-04-24 DIAGNOSIS — Z23 Encounter for immunization: Secondary | ICD-10-CM | POA: Diagnosis not present

## 2016-04-24 DIAGNOSIS — N2581 Secondary hyperparathyroidism of renal origin: Secondary | ICD-10-CM | POA: Diagnosis not present

## 2016-04-24 DIAGNOSIS — D509 Iron deficiency anemia, unspecified: Secondary | ICD-10-CM | POA: Diagnosis not present

## 2016-04-26 DIAGNOSIS — Z23 Encounter for immunization: Secondary | ICD-10-CM | POA: Diagnosis not present

## 2016-04-26 DIAGNOSIS — N2581 Secondary hyperparathyroidism of renal origin: Secondary | ICD-10-CM | POA: Diagnosis not present

## 2016-04-26 DIAGNOSIS — D631 Anemia in chronic kidney disease: Secondary | ICD-10-CM | POA: Diagnosis not present

## 2016-04-26 DIAGNOSIS — N186 End stage renal disease: Secondary | ICD-10-CM | POA: Diagnosis not present

## 2016-04-26 DIAGNOSIS — D509 Iron deficiency anemia, unspecified: Secondary | ICD-10-CM | POA: Diagnosis not present

## 2016-04-28 DIAGNOSIS — N186 End stage renal disease: Secondary | ICD-10-CM | POA: Diagnosis not present

## 2016-04-28 DIAGNOSIS — D509 Iron deficiency anemia, unspecified: Secondary | ICD-10-CM | POA: Diagnosis not present

## 2016-04-28 DIAGNOSIS — D631 Anemia in chronic kidney disease: Secondary | ICD-10-CM | POA: Diagnosis not present

## 2016-04-28 DIAGNOSIS — N2581 Secondary hyperparathyroidism of renal origin: Secondary | ICD-10-CM | POA: Diagnosis not present

## 2016-04-28 DIAGNOSIS — Z23 Encounter for immunization: Secondary | ICD-10-CM | POA: Diagnosis not present

## 2016-05-01 DIAGNOSIS — N186 End stage renal disease: Secondary | ICD-10-CM | POA: Diagnosis not present

## 2016-05-01 DIAGNOSIS — D631 Anemia in chronic kidney disease: Secondary | ICD-10-CM | POA: Diagnosis not present

## 2016-05-01 DIAGNOSIS — D509 Iron deficiency anemia, unspecified: Secondary | ICD-10-CM | POA: Diagnosis not present

## 2016-05-01 DIAGNOSIS — N2581 Secondary hyperparathyroidism of renal origin: Secondary | ICD-10-CM | POA: Diagnosis not present

## 2016-05-01 DIAGNOSIS — Z23 Encounter for immunization: Secondary | ICD-10-CM | POA: Diagnosis not present

## 2016-05-03 DIAGNOSIS — D509 Iron deficiency anemia, unspecified: Secondary | ICD-10-CM | POA: Diagnosis not present

## 2016-05-03 DIAGNOSIS — Z23 Encounter for immunization: Secondary | ICD-10-CM | POA: Diagnosis not present

## 2016-05-03 DIAGNOSIS — D631 Anemia in chronic kidney disease: Secondary | ICD-10-CM | POA: Diagnosis not present

## 2016-05-03 DIAGNOSIS — N2581 Secondary hyperparathyroidism of renal origin: Secondary | ICD-10-CM | POA: Diagnosis not present

## 2016-05-03 DIAGNOSIS — N186 End stage renal disease: Secondary | ICD-10-CM | POA: Diagnosis not present

## 2016-05-05 DIAGNOSIS — N186 End stage renal disease: Secondary | ICD-10-CM | POA: Diagnosis not present

## 2016-05-05 DIAGNOSIS — N2581 Secondary hyperparathyroidism of renal origin: Secondary | ICD-10-CM | POA: Diagnosis not present

## 2016-05-05 DIAGNOSIS — Z23 Encounter for immunization: Secondary | ICD-10-CM | POA: Diagnosis not present

## 2016-05-05 DIAGNOSIS — D631 Anemia in chronic kidney disease: Secondary | ICD-10-CM | POA: Diagnosis not present

## 2016-05-05 DIAGNOSIS — D509 Iron deficiency anemia, unspecified: Secondary | ICD-10-CM | POA: Diagnosis not present

## 2016-05-08 DIAGNOSIS — N186 End stage renal disease: Secondary | ICD-10-CM | POA: Diagnosis not present

## 2016-05-08 DIAGNOSIS — Z23 Encounter for immunization: Secondary | ICD-10-CM | POA: Diagnosis not present

## 2016-05-08 DIAGNOSIS — D631 Anemia in chronic kidney disease: Secondary | ICD-10-CM | POA: Diagnosis not present

## 2016-05-08 DIAGNOSIS — N2581 Secondary hyperparathyroidism of renal origin: Secondary | ICD-10-CM | POA: Diagnosis not present

## 2016-05-08 DIAGNOSIS — D509 Iron deficiency anemia, unspecified: Secondary | ICD-10-CM | POA: Diagnosis not present

## 2016-05-10 DIAGNOSIS — Z23 Encounter for immunization: Secondary | ICD-10-CM | POA: Diagnosis not present

## 2016-05-10 DIAGNOSIS — D631 Anemia in chronic kidney disease: Secondary | ICD-10-CM | POA: Diagnosis not present

## 2016-05-10 DIAGNOSIS — D509 Iron deficiency anemia, unspecified: Secondary | ICD-10-CM | POA: Diagnosis not present

## 2016-05-10 DIAGNOSIS — N2581 Secondary hyperparathyroidism of renal origin: Secondary | ICD-10-CM | POA: Diagnosis not present

## 2016-05-10 DIAGNOSIS — N186 End stage renal disease: Secondary | ICD-10-CM | POA: Diagnosis not present

## 2016-05-12 DIAGNOSIS — Z23 Encounter for immunization: Secondary | ICD-10-CM | POA: Diagnosis not present

## 2016-05-12 DIAGNOSIS — D509 Iron deficiency anemia, unspecified: Secondary | ICD-10-CM | POA: Diagnosis not present

## 2016-05-12 DIAGNOSIS — N186 End stage renal disease: Secondary | ICD-10-CM | POA: Diagnosis not present

## 2016-05-12 DIAGNOSIS — D631 Anemia in chronic kidney disease: Secondary | ICD-10-CM | POA: Diagnosis not present

## 2016-05-12 DIAGNOSIS — N2581 Secondary hyperparathyroidism of renal origin: Secondary | ICD-10-CM | POA: Diagnosis not present

## 2016-05-15 DIAGNOSIS — Z23 Encounter for immunization: Secondary | ICD-10-CM | POA: Diagnosis not present

## 2016-05-15 DIAGNOSIS — D509 Iron deficiency anemia, unspecified: Secondary | ICD-10-CM | POA: Diagnosis not present

## 2016-05-15 DIAGNOSIS — N186 End stage renal disease: Secondary | ICD-10-CM | POA: Diagnosis not present

## 2016-05-15 DIAGNOSIS — D631 Anemia in chronic kidney disease: Secondary | ICD-10-CM | POA: Diagnosis not present

## 2016-05-15 DIAGNOSIS — N2581 Secondary hyperparathyroidism of renal origin: Secondary | ICD-10-CM | POA: Diagnosis not present

## 2016-05-17 DIAGNOSIS — N186 End stage renal disease: Secondary | ICD-10-CM | POA: Diagnosis not present

## 2016-05-17 DIAGNOSIS — Z23 Encounter for immunization: Secondary | ICD-10-CM | POA: Diagnosis not present

## 2016-05-17 DIAGNOSIS — N2581 Secondary hyperparathyroidism of renal origin: Secondary | ICD-10-CM | POA: Diagnosis not present

## 2016-05-17 DIAGNOSIS — D631 Anemia in chronic kidney disease: Secondary | ICD-10-CM | POA: Diagnosis not present

## 2016-05-17 DIAGNOSIS — D509 Iron deficiency anemia, unspecified: Secondary | ICD-10-CM | POA: Diagnosis not present

## 2016-05-18 DIAGNOSIS — I10 Essential (primary) hypertension: Secondary | ICD-10-CM | POA: Diagnosis not present

## 2016-05-18 DIAGNOSIS — N529 Male erectile dysfunction, unspecified: Secondary | ICD-10-CM | POA: Diagnosis not present

## 2016-05-19 DIAGNOSIS — D509 Iron deficiency anemia, unspecified: Secondary | ICD-10-CM | POA: Diagnosis not present

## 2016-05-19 DIAGNOSIS — D631 Anemia in chronic kidney disease: Secondary | ICD-10-CM | POA: Diagnosis not present

## 2016-05-19 DIAGNOSIS — N186 End stage renal disease: Secondary | ICD-10-CM | POA: Diagnosis not present

## 2016-05-19 DIAGNOSIS — N2581 Secondary hyperparathyroidism of renal origin: Secondary | ICD-10-CM | POA: Diagnosis not present

## 2016-05-19 DIAGNOSIS — Z23 Encounter for immunization: Secondary | ICD-10-CM | POA: Diagnosis not present

## 2016-05-22 DIAGNOSIS — N2581 Secondary hyperparathyroidism of renal origin: Secondary | ICD-10-CM | POA: Diagnosis not present

## 2016-05-22 DIAGNOSIS — N186 End stage renal disease: Secondary | ICD-10-CM | POA: Diagnosis not present

## 2016-05-22 DIAGNOSIS — Z992 Dependence on renal dialysis: Secondary | ICD-10-CM | POA: Diagnosis not present

## 2016-05-22 DIAGNOSIS — I120 Hypertensive chronic kidney disease with stage 5 chronic kidney disease or end stage renal disease: Secondary | ICD-10-CM | POA: Diagnosis not present

## 2016-05-22 DIAGNOSIS — D631 Anemia in chronic kidney disease: Secondary | ICD-10-CM | POA: Diagnosis not present

## 2016-05-22 DIAGNOSIS — Z23 Encounter for immunization: Secondary | ICD-10-CM | POA: Diagnosis not present

## 2016-05-22 DIAGNOSIS — D509 Iron deficiency anemia, unspecified: Secondary | ICD-10-CM | POA: Diagnosis not present

## 2016-05-23 DIAGNOSIS — D638 Anemia in other chronic diseases classified elsewhere: Secondary | ICD-10-CM | POA: Diagnosis not present

## 2016-05-23 DIAGNOSIS — H538 Other visual disturbances: Secondary | ICD-10-CM | POA: Diagnosis not present

## 2016-05-23 DIAGNOSIS — N529 Male erectile dysfunction, unspecified: Secondary | ICD-10-CM | POA: Diagnosis not present

## 2016-05-23 DIAGNOSIS — I10 Essential (primary) hypertension: Secondary | ICD-10-CM | POA: Diagnosis not present

## 2016-05-23 DIAGNOSIS — N184 Chronic kidney disease, stage 4 (severe): Secondary | ICD-10-CM | POA: Diagnosis not present

## 2016-05-23 DIAGNOSIS — Z01118 Encounter for examination of ears and hearing with other abnormal findings: Secondary | ICD-10-CM | POA: Diagnosis not present

## 2016-05-23 DIAGNOSIS — I739 Peripheral vascular disease, unspecified: Secondary | ICD-10-CM | POA: Diagnosis not present

## 2016-05-23 DIAGNOSIS — Z131 Encounter for screening for diabetes mellitus: Secondary | ICD-10-CM | POA: Diagnosis not present

## 2016-05-23 DIAGNOSIS — Z0000 Encounter for general adult medical examination without abnormal findings: Secondary | ICD-10-CM | POA: Diagnosis not present

## 2016-05-23 DIAGNOSIS — Z125 Encounter for screening for malignant neoplasm of prostate: Secondary | ICD-10-CM | POA: Diagnosis not present

## 2016-05-23 DIAGNOSIS — Z136 Encounter for screening for cardiovascular disorders: Secondary | ICD-10-CM | POA: Diagnosis not present

## 2016-05-24 DIAGNOSIS — N2581 Secondary hyperparathyroidism of renal origin: Secondary | ICD-10-CM | POA: Diagnosis not present

## 2016-05-24 DIAGNOSIS — N186 End stage renal disease: Secondary | ICD-10-CM | POA: Diagnosis not present

## 2016-05-24 DIAGNOSIS — D631 Anemia in chronic kidney disease: Secondary | ICD-10-CM | POA: Diagnosis not present

## 2016-05-26 DIAGNOSIS — D631 Anemia in chronic kidney disease: Secondary | ICD-10-CM | POA: Diagnosis not present

## 2016-05-26 DIAGNOSIS — N2581 Secondary hyperparathyroidism of renal origin: Secondary | ICD-10-CM | POA: Diagnosis not present

## 2016-05-26 DIAGNOSIS — N186 End stage renal disease: Secondary | ICD-10-CM | POA: Diagnosis not present

## 2016-05-29 DIAGNOSIS — N2581 Secondary hyperparathyroidism of renal origin: Secondary | ICD-10-CM | POA: Diagnosis not present

## 2016-05-29 DIAGNOSIS — D631 Anemia in chronic kidney disease: Secondary | ICD-10-CM | POA: Diagnosis not present

## 2016-05-29 DIAGNOSIS — N186 End stage renal disease: Secondary | ICD-10-CM | POA: Diagnosis not present

## 2016-05-31 DIAGNOSIS — N186 End stage renal disease: Secondary | ICD-10-CM | POA: Diagnosis not present

## 2016-05-31 DIAGNOSIS — D631 Anemia in chronic kidney disease: Secondary | ICD-10-CM | POA: Diagnosis not present

## 2016-05-31 DIAGNOSIS — N2581 Secondary hyperparathyroidism of renal origin: Secondary | ICD-10-CM | POA: Diagnosis not present

## 2016-06-02 DIAGNOSIS — N186 End stage renal disease: Secondary | ICD-10-CM | POA: Diagnosis not present

## 2016-06-02 DIAGNOSIS — N2581 Secondary hyperparathyroidism of renal origin: Secondary | ICD-10-CM | POA: Diagnosis not present

## 2016-06-02 DIAGNOSIS — D631 Anemia in chronic kidney disease: Secondary | ICD-10-CM | POA: Diagnosis not present

## 2016-06-05 DIAGNOSIS — N186 End stage renal disease: Secondary | ICD-10-CM | POA: Diagnosis not present

## 2016-06-05 DIAGNOSIS — N2581 Secondary hyperparathyroidism of renal origin: Secondary | ICD-10-CM | POA: Diagnosis not present

## 2016-06-05 DIAGNOSIS — D631 Anemia in chronic kidney disease: Secondary | ICD-10-CM | POA: Diagnosis not present

## 2016-06-07 DIAGNOSIS — N186 End stage renal disease: Secondary | ICD-10-CM | POA: Diagnosis not present

## 2016-06-07 DIAGNOSIS — N2581 Secondary hyperparathyroidism of renal origin: Secondary | ICD-10-CM | POA: Diagnosis not present

## 2016-06-07 DIAGNOSIS — D631 Anemia in chronic kidney disease: Secondary | ICD-10-CM | POA: Diagnosis not present

## 2016-06-09 DIAGNOSIS — N186 End stage renal disease: Secondary | ICD-10-CM | POA: Diagnosis not present

## 2016-06-09 DIAGNOSIS — N2581 Secondary hyperparathyroidism of renal origin: Secondary | ICD-10-CM | POA: Diagnosis not present

## 2016-06-09 DIAGNOSIS — D631 Anemia in chronic kidney disease: Secondary | ICD-10-CM | POA: Diagnosis not present

## 2016-06-11 DIAGNOSIS — D631 Anemia in chronic kidney disease: Secondary | ICD-10-CM | POA: Diagnosis not present

## 2016-06-11 DIAGNOSIS — N2581 Secondary hyperparathyroidism of renal origin: Secondary | ICD-10-CM | POA: Diagnosis not present

## 2016-06-11 DIAGNOSIS — N186 End stage renal disease: Secondary | ICD-10-CM | POA: Diagnosis not present

## 2016-06-13 DIAGNOSIS — D631 Anemia in chronic kidney disease: Secondary | ICD-10-CM | POA: Diagnosis not present

## 2016-06-13 DIAGNOSIS — N2581 Secondary hyperparathyroidism of renal origin: Secondary | ICD-10-CM | POA: Diagnosis not present

## 2016-06-13 DIAGNOSIS — N186 End stage renal disease: Secondary | ICD-10-CM | POA: Diagnosis not present

## 2016-06-15 DIAGNOSIS — I10 Essential (primary) hypertension: Secondary | ICD-10-CM | POA: Diagnosis not present

## 2016-06-15 DIAGNOSIS — N529 Male erectile dysfunction, unspecified: Secondary | ICD-10-CM | POA: Diagnosis not present

## 2016-06-16 DIAGNOSIS — N2581 Secondary hyperparathyroidism of renal origin: Secondary | ICD-10-CM | POA: Diagnosis not present

## 2016-06-16 DIAGNOSIS — N186 End stage renal disease: Secondary | ICD-10-CM | POA: Diagnosis not present

## 2016-06-16 DIAGNOSIS — D631 Anemia in chronic kidney disease: Secondary | ICD-10-CM | POA: Diagnosis not present

## 2016-06-19 DIAGNOSIS — D631 Anemia in chronic kidney disease: Secondary | ICD-10-CM | POA: Diagnosis not present

## 2016-06-19 DIAGNOSIS — N2581 Secondary hyperparathyroidism of renal origin: Secondary | ICD-10-CM | POA: Diagnosis not present

## 2016-06-19 DIAGNOSIS — N186 End stage renal disease: Secondary | ICD-10-CM | POA: Diagnosis not present

## 2016-06-21 DIAGNOSIS — I120 Hypertensive chronic kidney disease with stage 5 chronic kidney disease or end stage renal disease: Secondary | ICD-10-CM | POA: Diagnosis not present

## 2016-06-21 DIAGNOSIS — N186 End stage renal disease: Secondary | ICD-10-CM | POA: Diagnosis not present

## 2016-06-21 DIAGNOSIS — N2581 Secondary hyperparathyroidism of renal origin: Secondary | ICD-10-CM | POA: Diagnosis not present

## 2016-06-21 DIAGNOSIS — D631 Anemia in chronic kidney disease: Secondary | ICD-10-CM | POA: Diagnosis not present

## 2016-06-21 DIAGNOSIS — Z992 Dependence on renal dialysis: Secondary | ICD-10-CM | POA: Diagnosis not present

## 2016-06-23 DIAGNOSIS — D631 Anemia in chronic kidney disease: Secondary | ICD-10-CM | POA: Diagnosis not present

## 2016-06-23 DIAGNOSIS — N2581 Secondary hyperparathyroidism of renal origin: Secondary | ICD-10-CM | POA: Diagnosis not present

## 2016-06-23 DIAGNOSIS — N186 End stage renal disease: Secondary | ICD-10-CM | POA: Diagnosis not present

## 2016-06-23 DIAGNOSIS — Z992 Dependence on renal dialysis: Secondary | ICD-10-CM | POA: Diagnosis not present

## 2016-06-26 DIAGNOSIS — N2581 Secondary hyperparathyroidism of renal origin: Secondary | ICD-10-CM | POA: Diagnosis not present

## 2016-06-26 DIAGNOSIS — Z992 Dependence on renal dialysis: Secondary | ICD-10-CM | POA: Diagnosis not present

## 2016-06-26 DIAGNOSIS — D631 Anemia in chronic kidney disease: Secondary | ICD-10-CM | POA: Diagnosis not present

## 2016-06-26 DIAGNOSIS — N186 End stage renal disease: Secondary | ICD-10-CM | POA: Diagnosis not present

## 2016-06-28 DIAGNOSIS — N2581 Secondary hyperparathyroidism of renal origin: Secondary | ICD-10-CM | POA: Diagnosis not present

## 2016-06-28 DIAGNOSIS — Z992 Dependence on renal dialysis: Secondary | ICD-10-CM | POA: Diagnosis not present

## 2016-06-28 DIAGNOSIS — D631 Anemia in chronic kidney disease: Secondary | ICD-10-CM | POA: Diagnosis not present

## 2016-06-28 DIAGNOSIS — N186 End stage renal disease: Secondary | ICD-10-CM | POA: Diagnosis not present

## 2016-06-30 DIAGNOSIS — N2581 Secondary hyperparathyroidism of renal origin: Secondary | ICD-10-CM | POA: Diagnosis not present

## 2016-06-30 DIAGNOSIS — D631 Anemia in chronic kidney disease: Secondary | ICD-10-CM | POA: Diagnosis not present

## 2016-06-30 DIAGNOSIS — N186 End stage renal disease: Secondary | ICD-10-CM | POA: Diagnosis not present

## 2016-06-30 DIAGNOSIS — Z992 Dependence on renal dialysis: Secondary | ICD-10-CM | POA: Diagnosis not present

## 2016-07-03 DIAGNOSIS — Z992 Dependence on renal dialysis: Secondary | ICD-10-CM | POA: Diagnosis not present

## 2016-07-03 DIAGNOSIS — D631 Anemia in chronic kidney disease: Secondary | ICD-10-CM | POA: Diagnosis not present

## 2016-07-03 DIAGNOSIS — N186 End stage renal disease: Secondary | ICD-10-CM | POA: Diagnosis not present

## 2016-07-03 DIAGNOSIS — N2581 Secondary hyperparathyroidism of renal origin: Secondary | ICD-10-CM | POA: Diagnosis not present

## 2016-07-05 DIAGNOSIS — N186 End stage renal disease: Secondary | ICD-10-CM | POA: Diagnosis not present

## 2016-07-05 DIAGNOSIS — N2581 Secondary hyperparathyroidism of renal origin: Secondary | ICD-10-CM | POA: Diagnosis not present

## 2016-07-05 DIAGNOSIS — D631 Anemia in chronic kidney disease: Secondary | ICD-10-CM | POA: Diagnosis not present

## 2016-07-05 DIAGNOSIS — Z992 Dependence on renal dialysis: Secondary | ICD-10-CM | POA: Diagnosis not present

## 2016-07-06 DIAGNOSIS — N186 End stage renal disease: Secondary | ICD-10-CM | POA: Diagnosis not present

## 2016-07-06 DIAGNOSIS — D631 Anemia in chronic kidney disease: Secondary | ICD-10-CM | POA: Diagnosis not present

## 2016-07-06 DIAGNOSIS — Z992 Dependence on renal dialysis: Secondary | ICD-10-CM | POA: Diagnosis not present

## 2016-07-06 DIAGNOSIS — N2581 Secondary hyperparathyroidism of renal origin: Secondary | ICD-10-CM | POA: Diagnosis not present

## 2016-07-10 DIAGNOSIS — N2581 Secondary hyperparathyroidism of renal origin: Secondary | ICD-10-CM | POA: Diagnosis not present

## 2016-07-10 DIAGNOSIS — N186 End stage renal disease: Secondary | ICD-10-CM | POA: Diagnosis not present

## 2016-07-10 DIAGNOSIS — Z992 Dependence on renal dialysis: Secondary | ICD-10-CM | POA: Diagnosis not present

## 2016-07-10 DIAGNOSIS — D631 Anemia in chronic kidney disease: Secondary | ICD-10-CM | POA: Diagnosis not present

## 2016-07-12 DIAGNOSIS — N2581 Secondary hyperparathyroidism of renal origin: Secondary | ICD-10-CM | POA: Diagnosis not present

## 2016-07-12 DIAGNOSIS — N186 End stage renal disease: Secondary | ICD-10-CM | POA: Diagnosis not present

## 2016-07-12 DIAGNOSIS — D631 Anemia in chronic kidney disease: Secondary | ICD-10-CM | POA: Diagnosis not present

## 2016-07-12 DIAGNOSIS — Z992 Dependence on renal dialysis: Secondary | ICD-10-CM | POA: Diagnosis not present

## 2016-07-14 DIAGNOSIS — N2581 Secondary hyperparathyroidism of renal origin: Secondary | ICD-10-CM | POA: Diagnosis not present

## 2016-07-14 DIAGNOSIS — D631 Anemia in chronic kidney disease: Secondary | ICD-10-CM | POA: Diagnosis not present

## 2016-07-14 DIAGNOSIS — Z992 Dependence on renal dialysis: Secondary | ICD-10-CM | POA: Diagnosis not present

## 2016-07-14 DIAGNOSIS — N186 End stage renal disease: Secondary | ICD-10-CM | POA: Diagnosis not present

## 2016-07-17 DIAGNOSIS — N186 End stage renal disease: Secondary | ICD-10-CM | POA: Diagnosis not present

## 2016-07-17 DIAGNOSIS — D631 Anemia in chronic kidney disease: Secondary | ICD-10-CM | POA: Diagnosis not present

## 2016-07-17 DIAGNOSIS — Z992 Dependence on renal dialysis: Secondary | ICD-10-CM | POA: Diagnosis not present

## 2016-07-17 DIAGNOSIS — N2581 Secondary hyperparathyroidism of renal origin: Secondary | ICD-10-CM | POA: Diagnosis not present

## 2016-07-19 DIAGNOSIS — I10 Essential (primary) hypertension: Secondary | ICD-10-CM | POA: Diagnosis not present

## 2016-07-19 DIAGNOSIS — N2581 Secondary hyperparathyroidism of renal origin: Secondary | ICD-10-CM | POA: Diagnosis not present

## 2016-07-19 DIAGNOSIS — N529 Male erectile dysfunction, unspecified: Secondary | ICD-10-CM | POA: Diagnosis not present

## 2016-07-19 DIAGNOSIS — D631 Anemia in chronic kidney disease: Secondary | ICD-10-CM | POA: Diagnosis not present

## 2016-07-19 DIAGNOSIS — N186 End stage renal disease: Secondary | ICD-10-CM | POA: Diagnosis not present

## 2016-07-19 DIAGNOSIS — Z992 Dependence on renal dialysis: Secondary | ICD-10-CM | POA: Diagnosis not present

## 2016-07-21 DIAGNOSIS — Z992 Dependence on renal dialysis: Secondary | ICD-10-CM | POA: Diagnosis not present

## 2016-07-21 DIAGNOSIS — N2581 Secondary hyperparathyroidism of renal origin: Secondary | ICD-10-CM | POA: Diagnosis not present

## 2016-07-21 DIAGNOSIS — N186 End stage renal disease: Secondary | ICD-10-CM | POA: Diagnosis not present

## 2016-07-21 DIAGNOSIS — D631 Anemia in chronic kidney disease: Secondary | ICD-10-CM | POA: Diagnosis not present

## 2016-07-22 DIAGNOSIS — N186 End stage renal disease: Secondary | ICD-10-CM | POA: Diagnosis not present

## 2016-07-22 DIAGNOSIS — Z992 Dependence on renal dialysis: Secondary | ICD-10-CM | POA: Diagnosis not present

## 2016-07-22 DIAGNOSIS — I120 Hypertensive chronic kidney disease with stage 5 chronic kidney disease or end stage renal disease: Secondary | ICD-10-CM | POA: Diagnosis not present

## 2016-07-24 DIAGNOSIS — D631 Anemia in chronic kidney disease: Secondary | ICD-10-CM | POA: Diagnosis not present

## 2016-07-24 DIAGNOSIS — N186 End stage renal disease: Secondary | ICD-10-CM | POA: Diagnosis not present

## 2016-07-24 DIAGNOSIS — N2581 Secondary hyperparathyroidism of renal origin: Secondary | ICD-10-CM | POA: Diagnosis not present

## 2016-07-24 DIAGNOSIS — Z992 Dependence on renal dialysis: Secondary | ICD-10-CM | POA: Diagnosis not present

## 2016-07-26 DIAGNOSIS — N186 End stage renal disease: Secondary | ICD-10-CM | POA: Diagnosis not present

## 2016-07-26 DIAGNOSIS — Z992 Dependence on renal dialysis: Secondary | ICD-10-CM | POA: Diagnosis not present

## 2016-07-26 DIAGNOSIS — N2581 Secondary hyperparathyroidism of renal origin: Secondary | ICD-10-CM | POA: Diagnosis not present

## 2016-07-26 DIAGNOSIS — D631 Anemia in chronic kidney disease: Secondary | ICD-10-CM | POA: Diagnosis not present

## 2016-07-28 DIAGNOSIS — D631 Anemia in chronic kidney disease: Secondary | ICD-10-CM | POA: Diagnosis not present

## 2016-07-28 DIAGNOSIS — Z992 Dependence on renal dialysis: Secondary | ICD-10-CM | POA: Diagnosis not present

## 2016-07-28 DIAGNOSIS — N2581 Secondary hyperparathyroidism of renal origin: Secondary | ICD-10-CM | POA: Diagnosis not present

## 2016-07-28 DIAGNOSIS — N186 End stage renal disease: Secondary | ICD-10-CM | POA: Diagnosis not present

## 2016-07-31 DIAGNOSIS — Z992 Dependence on renal dialysis: Secondary | ICD-10-CM | POA: Diagnosis not present

## 2016-07-31 DIAGNOSIS — N186 End stage renal disease: Secondary | ICD-10-CM | POA: Diagnosis not present

## 2016-07-31 DIAGNOSIS — D631 Anemia in chronic kidney disease: Secondary | ICD-10-CM | POA: Diagnosis not present

## 2016-07-31 DIAGNOSIS — N2581 Secondary hyperparathyroidism of renal origin: Secondary | ICD-10-CM | POA: Diagnosis not present

## 2016-08-02 DIAGNOSIS — Z992 Dependence on renal dialysis: Secondary | ICD-10-CM | POA: Diagnosis not present

## 2016-08-02 DIAGNOSIS — N186 End stage renal disease: Secondary | ICD-10-CM | POA: Diagnosis not present

## 2016-08-02 DIAGNOSIS — D631 Anemia in chronic kidney disease: Secondary | ICD-10-CM | POA: Diagnosis not present

## 2016-08-02 DIAGNOSIS — N2581 Secondary hyperparathyroidism of renal origin: Secondary | ICD-10-CM | POA: Diagnosis not present

## 2016-08-04 DIAGNOSIS — Z992 Dependence on renal dialysis: Secondary | ICD-10-CM | POA: Diagnosis not present

## 2016-08-04 DIAGNOSIS — D631 Anemia in chronic kidney disease: Secondary | ICD-10-CM | POA: Diagnosis not present

## 2016-08-04 DIAGNOSIS — N186 End stage renal disease: Secondary | ICD-10-CM | POA: Diagnosis not present

## 2016-08-04 DIAGNOSIS — N2581 Secondary hyperparathyroidism of renal origin: Secondary | ICD-10-CM | POA: Diagnosis not present

## 2016-08-07 DIAGNOSIS — N186 End stage renal disease: Secondary | ICD-10-CM | POA: Diagnosis not present

## 2016-08-07 DIAGNOSIS — Z992 Dependence on renal dialysis: Secondary | ICD-10-CM | POA: Diagnosis not present

## 2016-08-07 DIAGNOSIS — N2581 Secondary hyperparathyroidism of renal origin: Secondary | ICD-10-CM | POA: Diagnosis not present

## 2016-08-07 DIAGNOSIS — D631 Anemia in chronic kidney disease: Secondary | ICD-10-CM | POA: Diagnosis not present

## 2016-08-09 DIAGNOSIS — N186 End stage renal disease: Secondary | ICD-10-CM | POA: Diagnosis not present

## 2016-08-09 DIAGNOSIS — N2581 Secondary hyperparathyroidism of renal origin: Secondary | ICD-10-CM | POA: Diagnosis not present

## 2016-08-09 DIAGNOSIS — Z992 Dependence on renal dialysis: Secondary | ICD-10-CM | POA: Diagnosis not present

## 2016-08-09 DIAGNOSIS — D631 Anemia in chronic kidney disease: Secondary | ICD-10-CM | POA: Diagnosis not present

## 2016-08-11 DIAGNOSIS — N2581 Secondary hyperparathyroidism of renal origin: Secondary | ICD-10-CM | POA: Diagnosis not present

## 2016-08-11 DIAGNOSIS — Z992 Dependence on renal dialysis: Secondary | ICD-10-CM | POA: Diagnosis not present

## 2016-08-11 DIAGNOSIS — D631 Anemia in chronic kidney disease: Secondary | ICD-10-CM | POA: Diagnosis not present

## 2016-08-11 DIAGNOSIS — N186 End stage renal disease: Secondary | ICD-10-CM | POA: Diagnosis not present

## 2016-08-14 DIAGNOSIS — D631 Anemia in chronic kidney disease: Secondary | ICD-10-CM | POA: Diagnosis not present

## 2016-08-14 DIAGNOSIS — Z992 Dependence on renal dialysis: Secondary | ICD-10-CM | POA: Diagnosis not present

## 2016-08-14 DIAGNOSIS — N186 End stage renal disease: Secondary | ICD-10-CM | POA: Diagnosis not present

## 2016-08-14 DIAGNOSIS — N2581 Secondary hyperparathyroidism of renal origin: Secondary | ICD-10-CM | POA: Diagnosis not present

## 2016-08-16 DIAGNOSIS — D631 Anemia in chronic kidney disease: Secondary | ICD-10-CM | POA: Diagnosis not present

## 2016-08-16 DIAGNOSIS — Z992 Dependence on renal dialysis: Secondary | ICD-10-CM | POA: Diagnosis not present

## 2016-08-16 DIAGNOSIS — N2581 Secondary hyperparathyroidism of renal origin: Secondary | ICD-10-CM | POA: Diagnosis not present

## 2016-08-16 DIAGNOSIS — N186 End stage renal disease: Secondary | ICD-10-CM | POA: Diagnosis not present

## 2016-08-18 DIAGNOSIS — Z992 Dependence on renal dialysis: Secondary | ICD-10-CM | POA: Diagnosis not present

## 2016-08-18 DIAGNOSIS — N186 End stage renal disease: Secondary | ICD-10-CM | POA: Diagnosis not present

## 2016-08-18 DIAGNOSIS — N2581 Secondary hyperparathyroidism of renal origin: Secondary | ICD-10-CM | POA: Diagnosis not present

## 2016-08-18 DIAGNOSIS — D631 Anemia in chronic kidney disease: Secondary | ICD-10-CM | POA: Diagnosis not present

## 2016-08-21 DIAGNOSIS — N186 End stage renal disease: Secondary | ICD-10-CM | POA: Diagnosis not present

## 2016-08-21 DIAGNOSIS — D631 Anemia in chronic kidney disease: Secondary | ICD-10-CM | POA: Diagnosis not present

## 2016-08-21 DIAGNOSIS — N2581 Secondary hyperparathyroidism of renal origin: Secondary | ICD-10-CM | POA: Diagnosis not present

## 2016-08-21 DIAGNOSIS — Z992 Dependence on renal dialysis: Secondary | ICD-10-CM | POA: Diagnosis not present

## 2016-08-22 DIAGNOSIS — I120 Hypertensive chronic kidney disease with stage 5 chronic kidney disease or end stage renal disease: Secondary | ICD-10-CM | POA: Diagnosis not present

## 2016-08-22 DIAGNOSIS — Z992 Dependence on renal dialysis: Secondary | ICD-10-CM | POA: Diagnosis not present

## 2016-08-22 DIAGNOSIS — N186 End stage renal disease: Secondary | ICD-10-CM | POA: Diagnosis not present

## 2016-08-23 DIAGNOSIS — N2581 Secondary hyperparathyroidism of renal origin: Secondary | ICD-10-CM | POA: Diagnosis not present

## 2016-08-23 DIAGNOSIS — N186 End stage renal disease: Secondary | ICD-10-CM | POA: Diagnosis not present

## 2016-08-23 DIAGNOSIS — D631 Anemia in chronic kidney disease: Secondary | ICD-10-CM | POA: Diagnosis not present

## 2016-08-25 DIAGNOSIS — D631 Anemia in chronic kidney disease: Secondary | ICD-10-CM | POA: Diagnosis not present

## 2016-08-25 DIAGNOSIS — N2581 Secondary hyperparathyroidism of renal origin: Secondary | ICD-10-CM | POA: Diagnosis not present

## 2016-08-25 DIAGNOSIS — N186 End stage renal disease: Secondary | ICD-10-CM | POA: Diagnosis not present

## 2016-08-28 DIAGNOSIS — N2581 Secondary hyperparathyroidism of renal origin: Secondary | ICD-10-CM | POA: Diagnosis not present

## 2016-08-28 DIAGNOSIS — D631 Anemia in chronic kidney disease: Secondary | ICD-10-CM | POA: Diagnosis not present

## 2016-08-28 DIAGNOSIS — N186 End stage renal disease: Secondary | ICD-10-CM | POA: Diagnosis not present

## 2016-08-30 DIAGNOSIS — N2581 Secondary hyperparathyroidism of renal origin: Secondary | ICD-10-CM | POA: Diagnosis not present

## 2016-08-30 DIAGNOSIS — N186 End stage renal disease: Secondary | ICD-10-CM | POA: Diagnosis not present

## 2016-08-30 DIAGNOSIS — D631 Anemia in chronic kidney disease: Secondary | ICD-10-CM | POA: Diagnosis not present

## 2016-09-01 DIAGNOSIS — D631 Anemia in chronic kidney disease: Secondary | ICD-10-CM | POA: Diagnosis not present

## 2016-09-01 DIAGNOSIS — N186 End stage renal disease: Secondary | ICD-10-CM | POA: Diagnosis not present

## 2016-09-01 DIAGNOSIS — N2581 Secondary hyperparathyroidism of renal origin: Secondary | ICD-10-CM | POA: Diagnosis not present

## 2016-09-04 DIAGNOSIS — N186 End stage renal disease: Secondary | ICD-10-CM | POA: Diagnosis not present

## 2016-09-04 DIAGNOSIS — D631 Anemia in chronic kidney disease: Secondary | ICD-10-CM | POA: Diagnosis not present

## 2016-09-04 DIAGNOSIS — N2581 Secondary hyperparathyroidism of renal origin: Secondary | ICD-10-CM | POA: Diagnosis not present

## 2016-09-05 DIAGNOSIS — D638 Anemia in other chronic diseases classified elsewhere: Secondary | ICD-10-CM | POA: Diagnosis not present

## 2016-09-05 DIAGNOSIS — Z136 Encounter for screening for cardiovascular disorders: Secondary | ICD-10-CM | POA: Diagnosis not present

## 2016-09-05 DIAGNOSIS — I10 Essential (primary) hypertension: Secondary | ICD-10-CM | POA: Diagnosis not present

## 2016-09-05 DIAGNOSIS — N400 Enlarged prostate without lower urinary tract symptoms: Secondary | ICD-10-CM | POA: Diagnosis not present

## 2016-09-05 DIAGNOSIS — N184 Chronic kidney disease, stage 4 (severe): Secondary | ICD-10-CM | POA: Diagnosis not present

## 2016-09-05 DIAGNOSIS — N529 Male erectile dysfunction, unspecified: Secondary | ICD-10-CM | POA: Diagnosis not present

## 2016-09-05 DIAGNOSIS — I739 Peripheral vascular disease, unspecified: Secondary | ICD-10-CM | POA: Diagnosis not present

## 2016-09-06 DIAGNOSIS — N186 End stage renal disease: Secondary | ICD-10-CM | POA: Diagnosis not present

## 2016-09-06 DIAGNOSIS — N2581 Secondary hyperparathyroidism of renal origin: Secondary | ICD-10-CM | POA: Diagnosis not present

## 2016-09-06 DIAGNOSIS — D631 Anemia in chronic kidney disease: Secondary | ICD-10-CM | POA: Diagnosis not present

## 2016-09-08 DIAGNOSIS — N186 End stage renal disease: Secondary | ICD-10-CM | POA: Diagnosis not present

## 2016-09-08 DIAGNOSIS — N2581 Secondary hyperparathyroidism of renal origin: Secondary | ICD-10-CM | POA: Diagnosis not present

## 2016-09-08 DIAGNOSIS — D631 Anemia in chronic kidney disease: Secondary | ICD-10-CM | POA: Diagnosis not present

## 2016-09-11 DIAGNOSIS — N186 End stage renal disease: Secondary | ICD-10-CM | POA: Diagnosis not present

## 2016-09-11 DIAGNOSIS — D631 Anemia in chronic kidney disease: Secondary | ICD-10-CM | POA: Diagnosis not present

## 2016-09-11 DIAGNOSIS — N2581 Secondary hyperparathyroidism of renal origin: Secondary | ICD-10-CM | POA: Diagnosis not present

## 2016-09-13 DIAGNOSIS — D631 Anemia in chronic kidney disease: Secondary | ICD-10-CM | POA: Diagnosis not present

## 2016-09-13 DIAGNOSIS — N2581 Secondary hyperparathyroidism of renal origin: Secondary | ICD-10-CM | POA: Diagnosis not present

## 2016-09-13 DIAGNOSIS — N186 End stage renal disease: Secondary | ICD-10-CM | POA: Diagnosis not present

## 2016-09-15 DIAGNOSIS — N2581 Secondary hyperparathyroidism of renal origin: Secondary | ICD-10-CM | POA: Diagnosis not present

## 2016-09-15 DIAGNOSIS — N186 End stage renal disease: Secondary | ICD-10-CM | POA: Diagnosis not present

## 2016-09-15 DIAGNOSIS — D631 Anemia in chronic kidney disease: Secondary | ICD-10-CM | POA: Diagnosis not present

## 2016-09-18 DIAGNOSIS — D631 Anemia in chronic kidney disease: Secondary | ICD-10-CM | POA: Diagnosis not present

## 2016-09-18 DIAGNOSIS — N2581 Secondary hyperparathyroidism of renal origin: Secondary | ICD-10-CM | POA: Diagnosis not present

## 2016-09-18 DIAGNOSIS — N186 End stage renal disease: Secondary | ICD-10-CM | POA: Diagnosis not present

## 2016-09-19 DIAGNOSIS — I120 Hypertensive chronic kidney disease with stage 5 chronic kidney disease or end stage renal disease: Secondary | ICD-10-CM | POA: Diagnosis not present

## 2016-09-19 DIAGNOSIS — N186 End stage renal disease: Secondary | ICD-10-CM | POA: Diagnosis not present

## 2016-09-19 DIAGNOSIS — Z992 Dependence on renal dialysis: Secondary | ICD-10-CM | POA: Diagnosis not present

## 2016-09-20 DIAGNOSIS — N2581 Secondary hyperparathyroidism of renal origin: Secondary | ICD-10-CM | POA: Diagnosis not present

## 2016-09-20 DIAGNOSIS — N186 End stage renal disease: Secondary | ICD-10-CM | POA: Diagnosis not present

## 2016-09-20 DIAGNOSIS — D631 Anemia in chronic kidney disease: Secondary | ICD-10-CM | POA: Diagnosis not present

## 2016-09-22 DIAGNOSIS — N186 End stage renal disease: Secondary | ICD-10-CM | POA: Diagnosis not present

## 2016-09-22 DIAGNOSIS — D631 Anemia in chronic kidney disease: Secondary | ICD-10-CM | POA: Diagnosis not present

## 2016-09-22 DIAGNOSIS — N2581 Secondary hyperparathyroidism of renal origin: Secondary | ICD-10-CM | POA: Diagnosis not present

## 2016-09-25 DIAGNOSIS — N186 End stage renal disease: Secondary | ICD-10-CM | POA: Diagnosis not present

## 2016-09-25 DIAGNOSIS — D631 Anemia in chronic kidney disease: Secondary | ICD-10-CM | POA: Diagnosis not present

## 2016-09-25 DIAGNOSIS — N2581 Secondary hyperparathyroidism of renal origin: Secondary | ICD-10-CM | POA: Diagnosis not present

## 2016-09-27 DIAGNOSIS — N186 End stage renal disease: Secondary | ICD-10-CM | POA: Diagnosis not present

## 2016-09-27 DIAGNOSIS — N2581 Secondary hyperparathyroidism of renal origin: Secondary | ICD-10-CM | POA: Diagnosis not present

## 2016-09-27 DIAGNOSIS — D631 Anemia in chronic kidney disease: Secondary | ICD-10-CM | POA: Diagnosis not present

## 2016-09-29 DIAGNOSIS — N2581 Secondary hyperparathyroidism of renal origin: Secondary | ICD-10-CM | POA: Diagnosis not present

## 2016-09-29 DIAGNOSIS — N186 End stage renal disease: Secondary | ICD-10-CM | POA: Diagnosis not present

## 2016-09-29 DIAGNOSIS — D631 Anemia in chronic kidney disease: Secondary | ICD-10-CM | POA: Diagnosis not present

## 2016-10-02 DIAGNOSIS — N186 End stage renal disease: Secondary | ICD-10-CM | POA: Diagnosis not present

## 2016-10-02 DIAGNOSIS — N2581 Secondary hyperparathyroidism of renal origin: Secondary | ICD-10-CM | POA: Diagnosis not present

## 2016-10-02 DIAGNOSIS — D631 Anemia in chronic kidney disease: Secondary | ICD-10-CM | POA: Diagnosis not present

## 2016-10-04 DIAGNOSIS — N186 End stage renal disease: Secondary | ICD-10-CM | POA: Diagnosis not present

## 2016-10-04 DIAGNOSIS — N2581 Secondary hyperparathyroidism of renal origin: Secondary | ICD-10-CM | POA: Diagnosis not present

## 2016-10-04 DIAGNOSIS — D631 Anemia in chronic kidney disease: Secondary | ICD-10-CM | POA: Diagnosis not present

## 2016-10-06 DIAGNOSIS — N186 End stage renal disease: Secondary | ICD-10-CM | POA: Diagnosis not present

## 2016-10-06 DIAGNOSIS — D631 Anemia in chronic kidney disease: Secondary | ICD-10-CM | POA: Diagnosis not present

## 2016-10-06 DIAGNOSIS — N2581 Secondary hyperparathyroidism of renal origin: Secondary | ICD-10-CM | POA: Diagnosis not present

## 2016-10-09 DIAGNOSIS — D631 Anemia in chronic kidney disease: Secondary | ICD-10-CM | POA: Diagnosis not present

## 2016-10-09 DIAGNOSIS — N2581 Secondary hyperparathyroidism of renal origin: Secondary | ICD-10-CM | POA: Diagnosis not present

## 2016-10-09 DIAGNOSIS — N186 End stage renal disease: Secondary | ICD-10-CM | POA: Diagnosis not present

## 2016-10-11 DIAGNOSIS — N2581 Secondary hyperparathyroidism of renal origin: Secondary | ICD-10-CM | POA: Diagnosis not present

## 2016-10-11 DIAGNOSIS — N186 End stage renal disease: Secondary | ICD-10-CM | POA: Diagnosis not present

## 2016-10-11 DIAGNOSIS — D631 Anemia in chronic kidney disease: Secondary | ICD-10-CM | POA: Diagnosis not present

## 2016-10-13 DIAGNOSIS — N186 End stage renal disease: Secondary | ICD-10-CM | POA: Diagnosis not present

## 2016-10-13 DIAGNOSIS — N2581 Secondary hyperparathyroidism of renal origin: Secondary | ICD-10-CM | POA: Diagnosis not present

## 2016-10-13 DIAGNOSIS — D631 Anemia in chronic kidney disease: Secondary | ICD-10-CM | POA: Diagnosis not present

## 2016-10-16 DIAGNOSIS — N2581 Secondary hyperparathyroidism of renal origin: Secondary | ICD-10-CM | POA: Diagnosis not present

## 2016-10-16 DIAGNOSIS — D631 Anemia in chronic kidney disease: Secondary | ICD-10-CM | POA: Diagnosis not present

## 2016-10-16 DIAGNOSIS — N186 End stage renal disease: Secondary | ICD-10-CM | POA: Diagnosis not present

## 2016-10-18 DIAGNOSIS — D631 Anemia in chronic kidney disease: Secondary | ICD-10-CM | POA: Diagnosis not present

## 2016-10-18 DIAGNOSIS — N186 End stage renal disease: Secondary | ICD-10-CM | POA: Diagnosis not present

## 2016-10-18 DIAGNOSIS — N2581 Secondary hyperparathyroidism of renal origin: Secondary | ICD-10-CM | POA: Diagnosis not present

## 2016-10-20 DIAGNOSIS — D631 Anemia in chronic kidney disease: Secondary | ICD-10-CM | POA: Diagnosis not present

## 2016-10-20 DIAGNOSIS — N2581 Secondary hyperparathyroidism of renal origin: Secondary | ICD-10-CM | POA: Diagnosis not present

## 2016-10-20 DIAGNOSIS — I120 Hypertensive chronic kidney disease with stage 5 chronic kidney disease or end stage renal disease: Secondary | ICD-10-CM | POA: Diagnosis not present

## 2016-10-20 DIAGNOSIS — N186 End stage renal disease: Secondary | ICD-10-CM | POA: Diagnosis not present

## 2016-10-20 DIAGNOSIS — Z992 Dependence on renal dialysis: Secondary | ICD-10-CM | POA: Diagnosis not present

## 2016-10-23 DIAGNOSIS — N186 End stage renal disease: Secondary | ICD-10-CM | POA: Diagnosis not present

## 2016-10-23 DIAGNOSIS — D631 Anemia in chronic kidney disease: Secondary | ICD-10-CM | POA: Diagnosis not present

## 2016-10-23 DIAGNOSIS — N2581 Secondary hyperparathyroidism of renal origin: Secondary | ICD-10-CM | POA: Diagnosis not present

## 2016-10-25 DIAGNOSIS — D631 Anemia in chronic kidney disease: Secondary | ICD-10-CM | POA: Diagnosis not present

## 2016-10-25 DIAGNOSIS — N2581 Secondary hyperparathyroidism of renal origin: Secondary | ICD-10-CM | POA: Diagnosis not present

## 2016-10-25 DIAGNOSIS — N186 End stage renal disease: Secondary | ICD-10-CM | POA: Diagnosis not present

## 2016-10-27 DIAGNOSIS — D631 Anemia in chronic kidney disease: Secondary | ICD-10-CM | POA: Diagnosis not present

## 2016-10-27 DIAGNOSIS — N2581 Secondary hyperparathyroidism of renal origin: Secondary | ICD-10-CM | POA: Diagnosis not present

## 2016-10-27 DIAGNOSIS — N186 End stage renal disease: Secondary | ICD-10-CM | POA: Diagnosis not present

## 2016-10-30 DIAGNOSIS — N186 End stage renal disease: Secondary | ICD-10-CM | POA: Diagnosis not present

## 2016-10-30 DIAGNOSIS — N2581 Secondary hyperparathyroidism of renal origin: Secondary | ICD-10-CM | POA: Diagnosis not present

## 2016-10-30 DIAGNOSIS — D631 Anemia in chronic kidney disease: Secondary | ICD-10-CM | POA: Diagnosis not present

## 2016-11-01 DIAGNOSIS — D631 Anemia in chronic kidney disease: Secondary | ICD-10-CM | POA: Diagnosis not present

## 2016-11-01 DIAGNOSIS — N186 End stage renal disease: Secondary | ICD-10-CM | POA: Diagnosis not present

## 2016-11-01 DIAGNOSIS — N2581 Secondary hyperparathyroidism of renal origin: Secondary | ICD-10-CM | POA: Diagnosis not present

## 2016-11-03 DIAGNOSIS — N2581 Secondary hyperparathyroidism of renal origin: Secondary | ICD-10-CM | POA: Diagnosis not present

## 2016-11-03 DIAGNOSIS — D631 Anemia in chronic kidney disease: Secondary | ICD-10-CM | POA: Diagnosis not present

## 2016-11-03 DIAGNOSIS — N186 End stage renal disease: Secondary | ICD-10-CM | POA: Diagnosis not present

## 2016-11-06 DIAGNOSIS — N2581 Secondary hyperparathyroidism of renal origin: Secondary | ICD-10-CM | POA: Diagnosis not present

## 2016-11-06 DIAGNOSIS — N186 End stage renal disease: Secondary | ICD-10-CM | POA: Diagnosis not present

## 2016-11-06 DIAGNOSIS — D631 Anemia in chronic kidney disease: Secondary | ICD-10-CM | POA: Diagnosis not present

## 2016-11-08 DIAGNOSIS — N2581 Secondary hyperparathyroidism of renal origin: Secondary | ICD-10-CM | POA: Diagnosis not present

## 2016-11-08 DIAGNOSIS — N186 End stage renal disease: Secondary | ICD-10-CM | POA: Diagnosis not present

## 2016-11-08 DIAGNOSIS — D631 Anemia in chronic kidney disease: Secondary | ICD-10-CM | POA: Diagnosis not present

## 2016-11-10 DIAGNOSIS — N2581 Secondary hyperparathyroidism of renal origin: Secondary | ICD-10-CM | POA: Diagnosis not present

## 2016-11-10 DIAGNOSIS — D631 Anemia in chronic kidney disease: Secondary | ICD-10-CM | POA: Diagnosis not present

## 2016-11-10 DIAGNOSIS — N186 End stage renal disease: Secondary | ICD-10-CM | POA: Diagnosis not present

## 2016-11-13 DIAGNOSIS — N186 End stage renal disease: Secondary | ICD-10-CM | POA: Diagnosis not present

## 2016-11-13 DIAGNOSIS — N2581 Secondary hyperparathyroidism of renal origin: Secondary | ICD-10-CM | POA: Diagnosis not present

## 2016-11-13 DIAGNOSIS — D631 Anemia in chronic kidney disease: Secondary | ICD-10-CM | POA: Diagnosis not present

## 2016-11-15 DIAGNOSIS — N186 End stage renal disease: Secondary | ICD-10-CM | POA: Diagnosis not present

## 2016-11-15 DIAGNOSIS — N2581 Secondary hyperparathyroidism of renal origin: Secondary | ICD-10-CM | POA: Diagnosis not present

## 2016-11-15 DIAGNOSIS — D631 Anemia in chronic kidney disease: Secondary | ICD-10-CM | POA: Diagnosis not present

## 2016-11-17 DIAGNOSIS — N2581 Secondary hyperparathyroidism of renal origin: Secondary | ICD-10-CM | POA: Diagnosis not present

## 2016-11-17 DIAGNOSIS — N186 End stage renal disease: Secondary | ICD-10-CM | POA: Diagnosis not present

## 2016-11-17 DIAGNOSIS — D631 Anemia in chronic kidney disease: Secondary | ICD-10-CM | POA: Diagnosis not present

## 2016-11-19 DIAGNOSIS — N186 End stage renal disease: Secondary | ICD-10-CM | POA: Diagnosis not present

## 2016-11-19 DIAGNOSIS — Z992 Dependence on renal dialysis: Secondary | ICD-10-CM | POA: Diagnosis not present

## 2016-11-19 DIAGNOSIS — I120 Hypertensive chronic kidney disease with stage 5 chronic kidney disease or end stage renal disease: Secondary | ICD-10-CM | POA: Diagnosis not present

## 2016-11-20 DIAGNOSIS — N186 End stage renal disease: Secondary | ICD-10-CM | POA: Diagnosis not present

## 2016-11-20 DIAGNOSIS — D631 Anemia in chronic kidney disease: Secondary | ICD-10-CM | POA: Diagnosis not present

## 2016-11-20 DIAGNOSIS — N2581 Secondary hyperparathyroidism of renal origin: Secondary | ICD-10-CM | POA: Diagnosis not present

## 2016-11-22 DIAGNOSIS — D631 Anemia in chronic kidney disease: Secondary | ICD-10-CM | POA: Diagnosis not present

## 2016-11-22 DIAGNOSIS — N2581 Secondary hyperparathyroidism of renal origin: Secondary | ICD-10-CM | POA: Diagnosis not present

## 2016-11-22 DIAGNOSIS — N186 End stage renal disease: Secondary | ICD-10-CM | POA: Diagnosis not present

## 2016-11-24 DIAGNOSIS — N2581 Secondary hyperparathyroidism of renal origin: Secondary | ICD-10-CM | POA: Diagnosis not present

## 2016-11-24 DIAGNOSIS — N186 End stage renal disease: Secondary | ICD-10-CM | POA: Diagnosis not present

## 2016-11-24 DIAGNOSIS — D631 Anemia in chronic kidney disease: Secondary | ICD-10-CM | POA: Diagnosis not present

## 2016-11-27 DIAGNOSIS — N2581 Secondary hyperparathyroidism of renal origin: Secondary | ICD-10-CM | POA: Diagnosis not present

## 2016-11-27 DIAGNOSIS — N186 End stage renal disease: Secondary | ICD-10-CM | POA: Diagnosis not present

## 2016-11-27 DIAGNOSIS — D631 Anemia in chronic kidney disease: Secondary | ICD-10-CM | POA: Diagnosis not present

## 2016-11-29 DIAGNOSIS — N186 End stage renal disease: Secondary | ICD-10-CM | POA: Diagnosis not present

## 2016-11-29 DIAGNOSIS — D631 Anemia in chronic kidney disease: Secondary | ICD-10-CM | POA: Diagnosis not present

## 2016-11-29 DIAGNOSIS — N2581 Secondary hyperparathyroidism of renal origin: Secondary | ICD-10-CM | POA: Diagnosis not present

## 2016-12-01 DIAGNOSIS — N186 End stage renal disease: Secondary | ICD-10-CM | POA: Diagnosis not present

## 2016-12-01 DIAGNOSIS — N2581 Secondary hyperparathyroidism of renal origin: Secondary | ICD-10-CM | POA: Diagnosis not present

## 2016-12-01 DIAGNOSIS — D631 Anemia in chronic kidney disease: Secondary | ICD-10-CM | POA: Diagnosis not present

## 2016-12-04 DIAGNOSIS — N186 End stage renal disease: Secondary | ICD-10-CM | POA: Diagnosis not present

## 2016-12-04 DIAGNOSIS — D631 Anemia in chronic kidney disease: Secondary | ICD-10-CM | POA: Diagnosis not present

## 2016-12-04 DIAGNOSIS — N2581 Secondary hyperparathyroidism of renal origin: Secondary | ICD-10-CM | POA: Diagnosis not present

## 2016-12-05 DIAGNOSIS — N400 Enlarged prostate without lower urinary tract symptoms: Secondary | ICD-10-CM | POA: Diagnosis not present

## 2016-12-05 DIAGNOSIS — N529 Male erectile dysfunction, unspecified: Secondary | ICD-10-CM | POA: Diagnosis not present

## 2016-12-05 DIAGNOSIS — N184 Chronic kidney disease, stage 4 (severe): Secondary | ICD-10-CM | POA: Diagnosis not present

## 2016-12-05 DIAGNOSIS — I739 Peripheral vascular disease, unspecified: Secondary | ICD-10-CM | POA: Diagnosis not present

## 2016-12-05 DIAGNOSIS — D638 Anemia in other chronic diseases classified elsewhere: Secondary | ICD-10-CM | POA: Diagnosis not present

## 2016-12-05 DIAGNOSIS — I10 Essential (primary) hypertension: Secondary | ICD-10-CM | POA: Diagnosis not present

## 2016-12-05 DIAGNOSIS — Z136 Encounter for screening for cardiovascular disorders: Secondary | ICD-10-CM | POA: Diagnosis not present

## 2016-12-06 DIAGNOSIS — N2581 Secondary hyperparathyroidism of renal origin: Secondary | ICD-10-CM | POA: Diagnosis not present

## 2016-12-06 DIAGNOSIS — D631 Anemia in chronic kidney disease: Secondary | ICD-10-CM | POA: Diagnosis not present

## 2016-12-06 DIAGNOSIS — N186 End stage renal disease: Secondary | ICD-10-CM | POA: Diagnosis not present

## 2016-12-08 DIAGNOSIS — N2581 Secondary hyperparathyroidism of renal origin: Secondary | ICD-10-CM | POA: Diagnosis not present

## 2016-12-08 DIAGNOSIS — D631 Anemia in chronic kidney disease: Secondary | ICD-10-CM | POA: Diagnosis not present

## 2016-12-08 DIAGNOSIS — N186 End stage renal disease: Secondary | ICD-10-CM | POA: Diagnosis not present

## 2016-12-11 DIAGNOSIS — N186 End stage renal disease: Secondary | ICD-10-CM | POA: Diagnosis not present

## 2016-12-11 DIAGNOSIS — D631 Anemia in chronic kidney disease: Secondary | ICD-10-CM | POA: Diagnosis not present

## 2016-12-11 DIAGNOSIS — N2581 Secondary hyperparathyroidism of renal origin: Secondary | ICD-10-CM | POA: Diagnosis not present

## 2016-12-13 DIAGNOSIS — N2581 Secondary hyperparathyroidism of renal origin: Secondary | ICD-10-CM | POA: Diagnosis not present

## 2016-12-13 DIAGNOSIS — N186 End stage renal disease: Secondary | ICD-10-CM | POA: Diagnosis not present

## 2016-12-13 DIAGNOSIS — D631 Anemia in chronic kidney disease: Secondary | ICD-10-CM | POA: Diagnosis not present

## 2016-12-15 DIAGNOSIS — D631 Anemia in chronic kidney disease: Secondary | ICD-10-CM | POA: Diagnosis not present

## 2016-12-15 DIAGNOSIS — N2581 Secondary hyperparathyroidism of renal origin: Secondary | ICD-10-CM | POA: Diagnosis not present

## 2016-12-15 DIAGNOSIS — N186 End stage renal disease: Secondary | ICD-10-CM | POA: Diagnosis not present

## 2016-12-18 DIAGNOSIS — N186 End stage renal disease: Secondary | ICD-10-CM | POA: Diagnosis not present

## 2016-12-18 DIAGNOSIS — D631 Anemia in chronic kidney disease: Secondary | ICD-10-CM | POA: Diagnosis not present

## 2016-12-18 DIAGNOSIS — N2581 Secondary hyperparathyroidism of renal origin: Secondary | ICD-10-CM | POA: Diagnosis not present

## 2016-12-20 DIAGNOSIS — N186 End stage renal disease: Secondary | ICD-10-CM | POA: Diagnosis not present

## 2016-12-20 DIAGNOSIS — I120 Hypertensive chronic kidney disease with stage 5 chronic kidney disease or end stage renal disease: Secondary | ICD-10-CM | POA: Diagnosis not present

## 2016-12-20 DIAGNOSIS — N2581 Secondary hyperparathyroidism of renal origin: Secondary | ICD-10-CM | POA: Diagnosis not present

## 2016-12-20 DIAGNOSIS — D631 Anemia in chronic kidney disease: Secondary | ICD-10-CM | POA: Diagnosis not present

## 2016-12-20 DIAGNOSIS — Z992 Dependence on renal dialysis: Secondary | ICD-10-CM | POA: Diagnosis not present

## 2016-12-22 DIAGNOSIS — N186 End stage renal disease: Secondary | ICD-10-CM | POA: Diagnosis not present

## 2016-12-22 DIAGNOSIS — N2581 Secondary hyperparathyroidism of renal origin: Secondary | ICD-10-CM | POA: Diagnosis not present

## 2016-12-22 DIAGNOSIS — D631 Anemia in chronic kidney disease: Secondary | ICD-10-CM | POA: Diagnosis not present

## 2016-12-25 DIAGNOSIS — N2581 Secondary hyperparathyroidism of renal origin: Secondary | ICD-10-CM | POA: Diagnosis not present

## 2016-12-25 DIAGNOSIS — D631 Anemia in chronic kidney disease: Secondary | ICD-10-CM | POA: Diagnosis not present

## 2016-12-25 DIAGNOSIS — N186 End stage renal disease: Secondary | ICD-10-CM | POA: Diagnosis not present

## 2016-12-27 DIAGNOSIS — N2581 Secondary hyperparathyroidism of renal origin: Secondary | ICD-10-CM | POA: Diagnosis not present

## 2016-12-27 DIAGNOSIS — D631 Anemia in chronic kidney disease: Secondary | ICD-10-CM | POA: Diagnosis not present

## 2016-12-27 DIAGNOSIS — N186 End stage renal disease: Secondary | ICD-10-CM | POA: Diagnosis not present

## 2016-12-29 DIAGNOSIS — D631 Anemia in chronic kidney disease: Secondary | ICD-10-CM | POA: Diagnosis not present

## 2016-12-29 DIAGNOSIS — N2581 Secondary hyperparathyroidism of renal origin: Secondary | ICD-10-CM | POA: Diagnosis not present

## 2016-12-29 DIAGNOSIS — N186 End stage renal disease: Secondary | ICD-10-CM | POA: Diagnosis not present

## 2017-01-01 DIAGNOSIS — N186 End stage renal disease: Secondary | ICD-10-CM | POA: Diagnosis not present

## 2017-01-01 DIAGNOSIS — D631 Anemia in chronic kidney disease: Secondary | ICD-10-CM | POA: Diagnosis not present

## 2017-01-01 DIAGNOSIS — N2581 Secondary hyperparathyroidism of renal origin: Secondary | ICD-10-CM | POA: Diagnosis not present

## 2017-01-03 DIAGNOSIS — N2581 Secondary hyperparathyroidism of renal origin: Secondary | ICD-10-CM | POA: Diagnosis not present

## 2017-01-03 DIAGNOSIS — D631 Anemia in chronic kidney disease: Secondary | ICD-10-CM | POA: Diagnosis not present

## 2017-01-03 DIAGNOSIS — N186 End stage renal disease: Secondary | ICD-10-CM | POA: Diagnosis not present

## 2017-01-05 DIAGNOSIS — N186 End stage renal disease: Secondary | ICD-10-CM | POA: Diagnosis not present

## 2017-01-05 DIAGNOSIS — N2581 Secondary hyperparathyroidism of renal origin: Secondary | ICD-10-CM | POA: Diagnosis not present

## 2017-01-05 DIAGNOSIS — D631 Anemia in chronic kidney disease: Secondary | ICD-10-CM | POA: Diagnosis not present

## 2017-01-08 DIAGNOSIS — N186 End stage renal disease: Secondary | ICD-10-CM | POA: Diagnosis not present

## 2017-01-08 DIAGNOSIS — D631 Anemia in chronic kidney disease: Secondary | ICD-10-CM | POA: Diagnosis not present

## 2017-01-08 DIAGNOSIS — N2581 Secondary hyperparathyroidism of renal origin: Secondary | ICD-10-CM | POA: Diagnosis not present

## 2017-01-10 DIAGNOSIS — D631 Anemia in chronic kidney disease: Secondary | ICD-10-CM | POA: Diagnosis not present

## 2017-01-10 DIAGNOSIS — N186 End stage renal disease: Secondary | ICD-10-CM | POA: Diagnosis not present

## 2017-01-10 DIAGNOSIS — N2581 Secondary hyperparathyroidism of renal origin: Secondary | ICD-10-CM | POA: Diagnosis not present

## 2017-01-12 DIAGNOSIS — N2581 Secondary hyperparathyroidism of renal origin: Secondary | ICD-10-CM | POA: Diagnosis not present

## 2017-01-12 DIAGNOSIS — N186 End stage renal disease: Secondary | ICD-10-CM | POA: Diagnosis not present

## 2017-01-12 DIAGNOSIS — D631 Anemia in chronic kidney disease: Secondary | ICD-10-CM | POA: Diagnosis not present

## 2017-01-15 DIAGNOSIS — N2581 Secondary hyperparathyroidism of renal origin: Secondary | ICD-10-CM | POA: Diagnosis not present

## 2017-01-15 DIAGNOSIS — N186 End stage renal disease: Secondary | ICD-10-CM | POA: Diagnosis not present

## 2017-01-15 DIAGNOSIS — D631 Anemia in chronic kidney disease: Secondary | ICD-10-CM | POA: Diagnosis not present

## 2017-01-17 DIAGNOSIS — N2581 Secondary hyperparathyroidism of renal origin: Secondary | ICD-10-CM | POA: Diagnosis not present

## 2017-01-17 DIAGNOSIS — N186 End stage renal disease: Secondary | ICD-10-CM | POA: Diagnosis not present

## 2017-01-17 DIAGNOSIS — D631 Anemia in chronic kidney disease: Secondary | ICD-10-CM | POA: Diagnosis not present

## 2017-01-19 DIAGNOSIS — D631 Anemia in chronic kidney disease: Secondary | ICD-10-CM | POA: Diagnosis not present

## 2017-01-19 DIAGNOSIS — N186 End stage renal disease: Secondary | ICD-10-CM | POA: Diagnosis not present

## 2017-01-19 DIAGNOSIS — Z992 Dependence on renal dialysis: Secondary | ICD-10-CM | POA: Diagnosis not present

## 2017-01-19 DIAGNOSIS — I120 Hypertensive chronic kidney disease with stage 5 chronic kidney disease or end stage renal disease: Secondary | ICD-10-CM | POA: Diagnosis not present

## 2017-01-19 DIAGNOSIS — N2581 Secondary hyperparathyroidism of renal origin: Secondary | ICD-10-CM | POA: Diagnosis not present

## 2017-01-22 DIAGNOSIS — D631 Anemia in chronic kidney disease: Secondary | ICD-10-CM | POA: Diagnosis not present

## 2017-01-22 DIAGNOSIS — N186 End stage renal disease: Secondary | ICD-10-CM | POA: Diagnosis not present

## 2017-01-22 DIAGNOSIS — N2581 Secondary hyperparathyroidism of renal origin: Secondary | ICD-10-CM | POA: Diagnosis not present

## 2017-01-24 DIAGNOSIS — N186 End stage renal disease: Secondary | ICD-10-CM | POA: Diagnosis not present

## 2017-01-24 DIAGNOSIS — N2581 Secondary hyperparathyroidism of renal origin: Secondary | ICD-10-CM | POA: Diagnosis not present

## 2017-01-24 DIAGNOSIS — D631 Anemia in chronic kidney disease: Secondary | ICD-10-CM | POA: Diagnosis not present

## 2017-01-26 DIAGNOSIS — N186 End stage renal disease: Secondary | ICD-10-CM | POA: Diagnosis not present

## 2017-01-26 DIAGNOSIS — N2581 Secondary hyperparathyroidism of renal origin: Secondary | ICD-10-CM | POA: Diagnosis not present

## 2017-01-26 DIAGNOSIS — D631 Anemia in chronic kidney disease: Secondary | ICD-10-CM | POA: Diagnosis not present

## 2017-01-29 DIAGNOSIS — N186 End stage renal disease: Secondary | ICD-10-CM | POA: Diagnosis not present

## 2017-01-29 DIAGNOSIS — N2581 Secondary hyperparathyroidism of renal origin: Secondary | ICD-10-CM | POA: Diagnosis not present

## 2017-01-29 DIAGNOSIS — D631 Anemia in chronic kidney disease: Secondary | ICD-10-CM | POA: Diagnosis not present

## 2017-01-31 DIAGNOSIS — N2581 Secondary hyperparathyroidism of renal origin: Secondary | ICD-10-CM | POA: Diagnosis not present

## 2017-01-31 DIAGNOSIS — D631 Anemia in chronic kidney disease: Secondary | ICD-10-CM | POA: Diagnosis not present

## 2017-01-31 DIAGNOSIS — N186 End stage renal disease: Secondary | ICD-10-CM | POA: Diagnosis not present

## 2017-02-02 DIAGNOSIS — D631 Anemia in chronic kidney disease: Secondary | ICD-10-CM | POA: Diagnosis not present

## 2017-02-02 DIAGNOSIS — N186 End stage renal disease: Secondary | ICD-10-CM | POA: Diagnosis not present

## 2017-02-02 DIAGNOSIS — N2581 Secondary hyperparathyroidism of renal origin: Secondary | ICD-10-CM | POA: Diagnosis not present

## 2017-02-05 DIAGNOSIS — N2581 Secondary hyperparathyroidism of renal origin: Secondary | ICD-10-CM | POA: Diagnosis not present

## 2017-02-05 DIAGNOSIS — N186 End stage renal disease: Secondary | ICD-10-CM | POA: Diagnosis not present

## 2017-02-05 DIAGNOSIS — D631 Anemia in chronic kidney disease: Secondary | ICD-10-CM | POA: Diagnosis not present

## 2017-02-07 DIAGNOSIS — D631 Anemia in chronic kidney disease: Secondary | ICD-10-CM | POA: Diagnosis not present

## 2017-02-07 DIAGNOSIS — N186 End stage renal disease: Secondary | ICD-10-CM | POA: Diagnosis not present

## 2017-02-07 DIAGNOSIS — N2581 Secondary hyperparathyroidism of renal origin: Secondary | ICD-10-CM | POA: Diagnosis not present

## 2017-02-09 DIAGNOSIS — N2581 Secondary hyperparathyroidism of renal origin: Secondary | ICD-10-CM | POA: Diagnosis not present

## 2017-02-09 DIAGNOSIS — D631 Anemia in chronic kidney disease: Secondary | ICD-10-CM | POA: Diagnosis not present

## 2017-02-09 DIAGNOSIS — N186 End stage renal disease: Secondary | ICD-10-CM | POA: Diagnosis not present

## 2017-02-12 DIAGNOSIS — N2581 Secondary hyperparathyroidism of renal origin: Secondary | ICD-10-CM | POA: Diagnosis not present

## 2017-02-12 DIAGNOSIS — N186 End stage renal disease: Secondary | ICD-10-CM | POA: Diagnosis not present

## 2017-02-12 DIAGNOSIS — D631 Anemia in chronic kidney disease: Secondary | ICD-10-CM | POA: Diagnosis not present

## 2017-02-14 DIAGNOSIS — N2581 Secondary hyperparathyroidism of renal origin: Secondary | ICD-10-CM | POA: Diagnosis not present

## 2017-02-14 DIAGNOSIS — N186 End stage renal disease: Secondary | ICD-10-CM | POA: Diagnosis not present

## 2017-02-14 DIAGNOSIS — D631 Anemia in chronic kidney disease: Secondary | ICD-10-CM | POA: Diagnosis not present

## 2017-02-16 DIAGNOSIS — D631 Anemia in chronic kidney disease: Secondary | ICD-10-CM | POA: Diagnosis not present

## 2017-02-16 DIAGNOSIS — N186 End stage renal disease: Secondary | ICD-10-CM | POA: Diagnosis not present

## 2017-02-16 DIAGNOSIS — N2581 Secondary hyperparathyroidism of renal origin: Secondary | ICD-10-CM | POA: Diagnosis not present

## 2017-02-19 DIAGNOSIS — D631 Anemia in chronic kidney disease: Secondary | ICD-10-CM | POA: Diagnosis not present

## 2017-02-19 DIAGNOSIS — N2581 Secondary hyperparathyroidism of renal origin: Secondary | ICD-10-CM | POA: Diagnosis not present

## 2017-02-19 DIAGNOSIS — N186 End stage renal disease: Secondary | ICD-10-CM | POA: Diagnosis not present

## 2017-02-19 DIAGNOSIS — Z8546 Personal history of malignant neoplasm of prostate: Secondary | ICD-10-CM | POA: Diagnosis not present

## 2017-02-19 DIAGNOSIS — Z992 Dependence on renal dialysis: Secondary | ICD-10-CM | POA: Diagnosis not present

## 2017-02-19 DIAGNOSIS — I120 Hypertensive chronic kidney disease with stage 5 chronic kidney disease or end stage renal disease: Secondary | ICD-10-CM | POA: Diagnosis not present

## 2017-02-21 DIAGNOSIS — N2581 Secondary hyperparathyroidism of renal origin: Secondary | ICD-10-CM | POA: Diagnosis not present

## 2017-02-21 DIAGNOSIS — N186 End stage renal disease: Secondary | ICD-10-CM | POA: Diagnosis not present

## 2017-02-21 DIAGNOSIS — D631 Anemia in chronic kidney disease: Secondary | ICD-10-CM | POA: Diagnosis not present

## 2017-02-23 DIAGNOSIS — N2581 Secondary hyperparathyroidism of renal origin: Secondary | ICD-10-CM | POA: Diagnosis not present

## 2017-02-23 DIAGNOSIS — N186 End stage renal disease: Secondary | ICD-10-CM | POA: Diagnosis not present

## 2017-02-23 DIAGNOSIS — D631 Anemia in chronic kidney disease: Secondary | ICD-10-CM | POA: Diagnosis not present

## 2017-02-26 DIAGNOSIS — N186 End stage renal disease: Secondary | ICD-10-CM | POA: Diagnosis not present

## 2017-02-26 DIAGNOSIS — N2581 Secondary hyperparathyroidism of renal origin: Secondary | ICD-10-CM | POA: Diagnosis not present

## 2017-02-26 DIAGNOSIS — D631 Anemia in chronic kidney disease: Secondary | ICD-10-CM | POA: Diagnosis not present

## 2017-02-28 DIAGNOSIS — N2581 Secondary hyperparathyroidism of renal origin: Secondary | ICD-10-CM | POA: Diagnosis not present

## 2017-02-28 DIAGNOSIS — N186 End stage renal disease: Secondary | ICD-10-CM | POA: Diagnosis not present

## 2017-02-28 DIAGNOSIS — D631 Anemia in chronic kidney disease: Secondary | ICD-10-CM | POA: Diagnosis not present

## 2017-03-02 DIAGNOSIS — D631 Anemia in chronic kidney disease: Secondary | ICD-10-CM | POA: Diagnosis not present

## 2017-03-02 DIAGNOSIS — N186 End stage renal disease: Secondary | ICD-10-CM | POA: Diagnosis not present

## 2017-03-02 DIAGNOSIS — N2581 Secondary hyperparathyroidism of renal origin: Secondary | ICD-10-CM | POA: Diagnosis not present

## 2017-03-05 DIAGNOSIS — D631 Anemia in chronic kidney disease: Secondary | ICD-10-CM | POA: Diagnosis not present

## 2017-03-05 DIAGNOSIS — N186 End stage renal disease: Secondary | ICD-10-CM | POA: Diagnosis not present

## 2017-03-05 DIAGNOSIS — N2581 Secondary hyperparathyroidism of renal origin: Secondary | ICD-10-CM | POA: Diagnosis not present

## 2017-03-07 DIAGNOSIS — N186 End stage renal disease: Secondary | ICD-10-CM | POA: Diagnosis not present

## 2017-03-07 DIAGNOSIS — D631 Anemia in chronic kidney disease: Secondary | ICD-10-CM | POA: Diagnosis not present

## 2017-03-07 DIAGNOSIS — N2581 Secondary hyperparathyroidism of renal origin: Secondary | ICD-10-CM | POA: Diagnosis not present

## 2017-03-09 DIAGNOSIS — D631 Anemia in chronic kidney disease: Secondary | ICD-10-CM | POA: Diagnosis not present

## 2017-03-09 DIAGNOSIS — N186 End stage renal disease: Secondary | ICD-10-CM | POA: Diagnosis not present

## 2017-03-09 DIAGNOSIS — N2581 Secondary hyperparathyroidism of renal origin: Secondary | ICD-10-CM | POA: Diagnosis not present

## 2017-03-12 DIAGNOSIS — D631 Anemia in chronic kidney disease: Secondary | ICD-10-CM | POA: Diagnosis not present

## 2017-03-12 DIAGNOSIS — N2581 Secondary hyperparathyroidism of renal origin: Secondary | ICD-10-CM | POA: Diagnosis not present

## 2017-03-12 DIAGNOSIS — N186 End stage renal disease: Secondary | ICD-10-CM | POA: Diagnosis not present

## 2017-03-14 DIAGNOSIS — D631 Anemia in chronic kidney disease: Secondary | ICD-10-CM | POA: Diagnosis not present

## 2017-03-14 DIAGNOSIS — N2581 Secondary hyperparathyroidism of renal origin: Secondary | ICD-10-CM | POA: Diagnosis not present

## 2017-03-14 DIAGNOSIS — N186 End stage renal disease: Secondary | ICD-10-CM | POA: Diagnosis not present

## 2017-03-16 DIAGNOSIS — N2581 Secondary hyperparathyroidism of renal origin: Secondary | ICD-10-CM | POA: Diagnosis not present

## 2017-03-16 DIAGNOSIS — D631 Anemia in chronic kidney disease: Secondary | ICD-10-CM | POA: Diagnosis not present

## 2017-03-16 DIAGNOSIS — N186 End stage renal disease: Secondary | ICD-10-CM | POA: Diagnosis not present

## 2017-03-19 DIAGNOSIS — N2581 Secondary hyperparathyroidism of renal origin: Secondary | ICD-10-CM | POA: Diagnosis not present

## 2017-03-19 DIAGNOSIS — D631 Anemia in chronic kidney disease: Secondary | ICD-10-CM | POA: Diagnosis not present

## 2017-03-19 DIAGNOSIS — N186 End stage renal disease: Secondary | ICD-10-CM | POA: Diagnosis not present

## 2017-03-21 DIAGNOSIS — N186 End stage renal disease: Secondary | ICD-10-CM | POA: Diagnosis not present

## 2017-03-21 DIAGNOSIS — D631 Anemia in chronic kidney disease: Secondary | ICD-10-CM | POA: Diagnosis not present

## 2017-03-21 DIAGNOSIS — N2581 Secondary hyperparathyroidism of renal origin: Secondary | ICD-10-CM | POA: Diagnosis not present

## 2017-03-22 DIAGNOSIS — I120 Hypertensive chronic kidney disease with stage 5 chronic kidney disease or end stage renal disease: Secondary | ICD-10-CM | POA: Diagnosis not present

## 2017-03-22 DIAGNOSIS — Z992 Dependence on renal dialysis: Secondary | ICD-10-CM | POA: Diagnosis not present

## 2017-03-22 DIAGNOSIS — N186 End stage renal disease: Secondary | ICD-10-CM | POA: Diagnosis not present

## 2017-03-23 DIAGNOSIS — Z23 Encounter for immunization: Secondary | ICD-10-CM | POA: Diagnosis not present

## 2017-03-23 DIAGNOSIS — N2581 Secondary hyperparathyroidism of renal origin: Secondary | ICD-10-CM | POA: Diagnosis not present

## 2017-03-23 DIAGNOSIS — N186 End stage renal disease: Secondary | ICD-10-CM | POA: Diagnosis not present

## 2017-03-23 DIAGNOSIS — D631 Anemia in chronic kidney disease: Secondary | ICD-10-CM | POA: Diagnosis not present

## 2017-03-26 DIAGNOSIS — N2581 Secondary hyperparathyroidism of renal origin: Secondary | ICD-10-CM | POA: Diagnosis not present

## 2017-03-26 DIAGNOSIS — D631 Anemia in chronic kidney disease: Secondary | ICD-10-CM | POA: Diagnosis not present

## 2017-03-26 DIAGNOSIS — N186 End stage renal disease: Secondary | ICD-10-CM | POA: Diagnosis not present

## 2017-03-26 DIAGNOSIS — Z23 Encounter for immunization: Secondary | ICD-10-CM | POA: Diagnosis not present

## 2017-03-28 DIAGNOSIS — N2581 Secondary hyperparathyroidism of renal origin: Secondary | ICD-10-CM | POA: Diagnosis not present

## 2017-03-28 DIAGNOSIS — D631 Anemia in chronic kidney disease: Secondary | ICD-10-CM | POA: Diagnosis not present

## 2017-03-28 DIAGNOSIS — N186 End stage renal disease: Secondary | ICD-10-CM | POA: Diagnosis not present

## 2017-03-28 DIAGNOSIS — Z23 Encounter for immunization: Secondary | ICD-10-CM | POA: Diagnosis not present

## 2017-03-30 DIAGNOSIS — N2581 Secondary hyperparathyroidism of renal origin: Secondary | ICD-10-CM | POA: Diagnosis not present

## 2017-03-30 DIAGNOSIS — D631 Anemia in chronic kidney disease: Secondary | ICD-10-CM | POA: Diagnosis not present

## 2017-03-30 DIAGNOSIS — Z23 Encounter for immunization: Secondary | ICD-10-CM | POA: Diagnosis not present

## 2017-03-30 DIAGNOSIS — N186 End stage renal disease: Secondary | ICD-10-CM | POA: Diagnosis not present

## 2017-04-02 DIAGNOSIS — Z23 Encounter for immunization: Secondary | ICD-10-CM | POA: Diagnosis not present

## 2017-04-02 DIAGNOSIS — N186 End stage renal disease: Secondary | ICD-10-CM | POA: Diagnosis not present

## 2017-04-02 DIAGNOSIS — N2581 Secondary hyperparathyroidism of renal origin: Secondary | ICD-10-CM | POA: Diagnosis not present

## 2017-04-02 DIAGNOSIS — D631 Anemia in chronic kidney disease: Secondary | ICD-10-CM | POA: Diagnosis not present

## 2017-04-03 DIAGNOSIS — I739 Peripheral vascular disease, unspecified: Secondary | ICD-10-CM | POA: Diagnosis not present

## 2017-04-03 DIAGNOSIS — N529 Male erectile dysfunction, unspecified: Secondary | ICD-10-CM | POA: Diagnosis not present

## 2017-04-03 DIAGNOSIS — D638 Anemia in other chronic diseases classified elsewhere: Secondary | ICD-10-CM | POA: Diagnosis not present

## 2017-04-03 DIAGNOSIS — I10 Essential (primary) hypertension: Secondary | ICD-10-CM | POA: Diagnosis not present

## 2017-04-03 DIAGNOSIS — N184 Chronic kidney disease, stage 4 (severe): Secondary | ICD-10-CM | POA: Diagnosis not present

## 2017-04-03 DIAGNOSIS — K581 Irritable bowel syndrome with constipation: Secondary | ICD-10-CM | POA: Diagnosis not present

## 2017-04-03 DIAGNOSIS — N400 Enlarged prostate without lower urinary tract symptoms: Secondary | ICD-10-CM | POA: Diagnosis not present

## 2017-04-04 DIAGNOSIS — Z23 Encounter for immunization: Secondary | ICD-10-CM | POA: Diagnosis not present

## 2017-04-04 DIAGNOSIS — N186 End stage renal disease: Secondary | ICD-10-CM | POA: Diagnosis not present

## 2017-04-04 DIAGNOSIS — D631 Anemia in chronic kidney disease: Secondary | ICD-10-CM | POA: Diagnosis not present

## 2017-04-04 DIAGNOSIS — N2581 Secondary hyperparathyroidism of renal origin: Secondary | ICD-10-CM | POA: Diagnosis not present

## 2017-04-06 DIAGNOSIS — N186 End stage renal disease: Secondary | ICD-10-CM | POA: Diagnosis not present

## 2017-04-06 DIAGNOSIS — Z23 Encounter for immunization: Secondary | ICD-10-CM | POA: Diagnosis not present

## 2017-04-06 DIAGNOSIS — D631 Anemia in chronic kidney disease: Secondary | ICD-10-CM | POA: Diagnosis not present

## 2017-04-06 DIAGNOSIS — N2581 Secondary hyperparathyroidism of renal origin: Secondary | ICD-10-CM | POA: Diagnosis not present

## 2017-04-09 DIAGNOSIS — D631 Anemia in chronic kidney disease: Secondary | ICD-10-CM | POA: Diagnosis not present

## 2017-04-09 DIAGNOSIS — Z23 Encounter for immunization: Secondary | ICD-10-CM | POA: Diagnosis not present

## 2017-04-09 DIAGNOSIS — N186 End stage renal disease: Secondary | ICD-10-CM | POA: Diagnosis not present

## 2017-04-09 DIAGNOSIS — N2581 Secondary hyperparathyroidism of renal origin: Secondary | ICD-10-CM | POA: Diagnosis not present

## 2017-04-11 DIAGNOSIS — N2581 Secondary hyperparathyroidism of renal origin: Secondary | ICD-10-CM | POA: Diagnosis not present

## 2017-04-11 DIAGNOSIS — N186 End stage renal disease: Secondary | ICD-10-CM | POA: Diagnosis not present

## 2017-04-11 DIAGNOSIS — D631 Anemia in chronic kidney disease: Secondary | ICD-10-CM | POA: Diagnosis not present

## 2017-04-11 DIAGNOSIS — Z23 Encounter for immunization: Secondary | ICD-10-CM | POA: Diagnosis not present

## 2017-04-13 DIAGNOSIS — N2581 Secondary hyperparathyroidism of renal origin: Secondary | ICD-10-CM | POA: Diagnosis not present

## 2017-04-13 DIAGNOSIS — N186 End stage renal disease: Secondary | ICD-10-CM | POA: Diagnosis not present

## 2017-04-13 DIAGNOSIS — Z23 Encounter for immunization: Secondary | ICD-10-CM | POA: Diagnosis not present

## 2017-04-13 DIAGNOSIS — D631 Anemia in chronic kidney disease: Secondary | ICD-10-CM | POA: Diagnosis not present

## 2017-04-16 DIAGNOSIS — N186 End stage renal disease: Secondary | ICD-10-CM | POA: Diagnosis not present

## 2017-04-16 DIAGNOSIS — N2581 Secondary hyperparathyroidism of renal origin: Secondary | ICD-10-CM | POA: Diagnosis not present

## 2017-04-16 DIAGNOSIS — D631 Anemia in chronic kidney disease: Secondary | ICD-10-CM | POA: Diagnosis not present

## 2017-04-16 DIAGNOSIS — Z23 Encounter for immunization: Secondary | ICD-10-CM | POA: Diagnosis not present

## 2017-04-18 DIAGNOSIS — N2581 Secondary hyperparathyroidism of renal origin: Secondary | ICD-10-CM | POA: Diagnosis not present

## 2017-04-18 DIAGNOSIS — N186 End stage renal disease: Secondary | ICD-10-CM | POA: Diagnosis not present

## 2017-04-18 DIAGNOSIS — D631 Anemia in chronic kidney disease: Secondary | ICD-10-CM | POA: Diagnosis not present

## 2017-04-18 DIAGNOSIS — Z23 Encounter for immunization: Secondary | ICD-10-CM | POA: Diagnosis not present

## 2017-04-20 DIAGNOSIS — N2581 Secondary hyperparathyroidism of renal origin: Secondary | ICD-10-CM | POA: Diagnosis not present

## 2017-04-20 DIAGNOSIS — D631 Anemia in chronic kidney disease: Secondary | ICD-10-CM | POA: Diagnosis not present

## 2017-04-20 DIAGNOSIS — Z23 Encounter for immunization: Secondary | ICD-10-CM | POA: Diagnosis not present

## 2017-04-20 DIAGNOSIS — N186 End stage renal disease: Secondary | ICD-10-CM | POA: Diagnosis not present

## 2017-04-21 DIAGNOSIS — Z992 Dependence on renal dialysis: Secondary | ICD-10-CM | POA: Diagnosis not present

## 2017-04-21 DIAGNOSIS — I120 Hypertensive chronic kidney disease with stage 5 chronic kidney disease or end stage renal disease: Secondary | ICD-10-CM | POA: Diagnosis not present

## 2017-04-21 DIAGNOSIS — N186 End stage renal disease: Secondary | ICD-10-CM | POA: Diagnosis not present

## 2017-04-23 DIAGNOSIS — N186 End stage renal disease: Secondary | ICD-10-CM | POA: Diagnosis not present

## 2017-04-23 DIAGNOSIS — D631 Anemia in chronic kidney disease: Secondary | ICD-10-CM | POA: Diagnosis not present

## 2017-04-23 DIAGNOSIS — N2581 Secondary hyperparathyroidism of renal origin: Secondary | ICD-10-CM | POA: Diagnosis not present

## 2017-04-25 DIAGNOSIS — N2581 Secondary hyperparathyroidism of renal origin: Secondary | ICD-10-CM | POA: Diagnosis not present

## 2017-04-25 DIAGNOSIS — D631 Anemia in chronic kidney disease: Secondary | ICD-10-CM | POA: Diagnosis not present

## 2017-04-25 DIAGNOSIS — N186 End stage renal disease: Secondary | ICD-10-CM | POA: Diagnosis not present

## 2017-04-27 DIAGNOSIS — D631 Anemia in chronic kidney disease: Secondary | ICD-10-CM | POA: Diagnosis not present

## 2017-04-27 DIAGNOSIS — N186 End stage renal disease: Secondary | ICD-10-CM | POA: Diagnosis not present

## 2017-04-27 DIAGNOSIS — N2581 Secondary hyperparathyroidism of renal origin: Secondary | ICD-10-CM | POA: Diagnosis not present

## 2017-04-30 DIAGNOSIS — N2581 Secondary hyperparathyroidism of renal origin: Secondary | ICD-10-CM | POA: Diagnosis not present

## 2017-04-30 DIAGNOSIS — D631 Anemia in chronic kidney disease: Secondary | ICD-10-CM | POA: Diagnosis not present

## 2017-04-30 DIAGNOSIS — N186 End stage renal disease: Secondary | ICD-10-CM | POA: Diagnosis not present

## 2017-05-01 DIAGNOSIS — K581 Irritable bowel syndrome with constipation: Secondary | ICD-10-CM | POA: Diagnosis not present

## 2017-05-01 DIAGNOSIS — N184 Chronic kidney disease, stage 4 (severe): Secondary | ICD-10-CM | POA: Diagnosis not present

## 2017-05-01 DIAGNOSIS — I739 Peripheral vascular disease, unspecified: Secondary | ICD-10-CM | POA: Diagnosis not present

## 2017-05-01 DIAGNOSIS — I10 Essential (primary) hypertension: Secondary | ICD-10-CM | POA: Diagnosis not present

## 2017-05-01 DIAGNOSIS — N400 Enlarged prostate without lower urinary tract symptoms: Secondary | ICD-10-CM | POA: Diagnosis not present

## 2017-05-01 DIAGNOSIS — N529 Male erectile dysfunction, unspecified: Secondary | ICD-10-CM | POA: Diagnosis not present

## 2017-05-01 DIAGNOSIS — D638 Anemia in other chronic diseases classified elsewhere: Secondary | ICD-10-CM | POA: Diagnosis not present

## 2017-05-02 DIAGNOSIS — D631 Anemia in chronic kidney disease: Secondary | ICD-10-CM | POA: Diagnosis not present

## 2017-05-02 DIAGNOSIS — N186 End stage renal disease: Secondary | ICD-10-CM | POA: Diagnosis not present

## 2017-05-02 DIAGNOSIS — N2581 Secondary hyperparathyroidism of renal origin: Secondary | ICD-10-CM | POA: Diagnosis not present

## 2017-05-04 DIAGNOSIS — D631 Anemia in chronic kidney disease: Secondary | ICD-10-CM | POA: Diagnosis not present

## 2017-05-04 DIAGNOSIS — N2581 Secondary hyperparathyroidism of renal origin: Secondary | ICD-10-CM | POA: Diagnosis not present

## 2017-05-04 DIAGNOSIS — N186 End stage renal disease: Secondary | ICD-10-CM | POA: Diagnosis not present

## 2017-05-08 DIAGNOSIS — N186 End stage renal disease: Secondary | ICD-10-CM | POA: Diagnosis not present

## 2017-05-08 DIAGNOSIS — I871 Compression of vein: Secondary | ICD-10-CM | POA: Diagnosis not present

## 2017-05-08 DIAGNOSIS — T82868A Thrombosis of vascular prosthetic devices, implants and grafts, initial encounter: Secondary | ICD-10-CM | POA: Diagnosis not present

## 2017-05-08 DIAGNOSIS — D631 Anemia in chronic kidney disease: Secondary | ICD-10-CM | POA: Diagnosis not present

## 2017-05-08 DIAGNOSIS — Z992 Dependence on renal dialysis: Secondary | ICD-10-CM | POA: Diagnosis not present

## 2017-05-08 DIAGNOSIS — N2581 Secondary hyperparathyroidism of renal origin: Secondary | ICD-10-CM | POA: Diagnosis not present

## 2017-05-09 DIAGNOSIS — N186 End stage renal disease: Secondary | ICD-10-CM | POA: Diagnosis not present

## 2017-05-09 DIAGNOSIS — D631 Anemia in chronic kidney disease: Secondary | ICD-10-CM | POA: Diagnosis not present

## 2017-05-09 DIAGNOSIS — N2581 Secondary hyperparathyroidism of renal origin: Secondary | ICD-10-CM | POA: Diagnosis not present

## 2017-05-11 DIAGNOSIS — D631 Anemia in chronic kidney disease: Secondary | ICD-10-CM | POA: Diagnosis not present

## 2017-05-11 DIAGNOSIS — N186 End stage renal disease: Secondary | ICD-10-CM | POA: Diagnosis not present

## 2017-05-11 DIAGNOSIS — N2581 Secondary hyperparathyroidism of renal origin: Secondary | ICD-10-CM | POA: Diagnosis not present

## 2017-05-14 DIAGNOSIS — N2581 Secondary hyperparathyroidism of renal origin: Secondary | ICD-10-CM | POA: Diagnosis not present

## 2017-05-14 DIAGNOSIS — N186 End stage renal disease: Secondary | ICD-10-CM | POA: Diagnosis not present

## 2017-05-14 DIAGNOSIS — D631 Anemia in chronic kidney disease: Secondary | ICD-10-CM | POA: Diagnosis not present

## 2017-05-16 DIAGNOSIS — N2581 Secondary hyperparathyroidism of renal origin: Secondary | ICD-10-CM | POA: Diagnosis not present

## 2017-05-16 DIAGNOSIS — D631 Anemia in chronic kidney disease: Secondary | ICD-10-CM | POA: Diagnosis not present

## 2017-05-16 DIAGNOSIS — N186 End stage renal disease: Secondary | ICD-10-CM | POA: Diagnosis not present

## 2017-05-18 DIAGNOSIS — N2581 Secondary hyperparathyroidism of renal origin: Secondary | ICD-10-CM | POA: Diagnosis not present

## 2017-05-18 DIAGNOSIS — D631 Anemia in chronic kidney disease: Secondary | ICD-10-CM | POA: Diagnosis not present

## 2017-05-18 DIAGNOSIS — N186 End stage renal disease: Secondary | ICD-10-CM | POA: Diagnosis not present

## 2017-05-21 ENCOUNTER — Telehealth: Payer: Self-pay | Admitting: *Deleted

## 2017-05-21 DIAGNOSIS — N2581 Secondary hyperparathyroidism of renal origin: Secondary | ICD-10-CM | POA: Diagnosis not present

## 2017-05-21 DIAGNOSIS — D631 Anemia in chronic kidney disease: Secondary | ICD-10-CM | POA: Diagnosis not present

## 2017-05-21 DIAGNOSIS — N186 End stage renal disease: Secondary | ICD-10-CM | POA: Diagnosis not present

## 2017-05-21 NOTE — Telephone Encounter (Signed)
Call from [place] at [facility] kidney center patient need AD access failed fistula has Dialysis cath. Last seen here 2015. Instructed center to fax a referral for appt. And vein mapping for first availabe doctor and to add note to call them with appointment time.

## 2017-05-22 ENCOUNTER — Other Ambulatory Visit: Payer: Self-pay

## 2017-05-22 DIAGNOSIS — Z01810 Encounter for preprocedural cardiovascular examination: Secondary | ICD-10-CM

## 2017-05-22 DIAGNOSIS — Z992 Dependence on renal dialysis: Secondary | ICD-10-CM | POA: Diagnosis not present

## 2017-05-22 DIAGNOSIS — N186 End stage renal disease: Secondary | ICD-10-CM

## 2017-05-22 DIAGNOSIS — I120 Hypertensive chronic kidney disease with stage 5 chronic kidney disease or end stage renal disease: Secondary | ICD-10-CM | POA: Diagnosis not present

## 2017-05-23 DIAGNOSIS — N2581 Secondary hyperparathyroidism of renal origin: Secondary | ICD-10-CM | POA: Diagnosis not present

## 2017-05-23 DIAGNOSIS — N186 End stage renal disease: Secondary | ICD-10-CM | POA: Diagnosis not present

## 2017-05-23 DIAGNOSIS — D631 Anemia in chronic kidney disease: Secondary | ICD-10-CM | POA: Diagnosis not present

## 2017-05-25 DIAGNOSIS — D631 Anemia in chronic kidney disease: Secondary | ICD-10-CM | POA: Diagnosis not present

## 2017-05-25 DIAGNOSIS — N186 End stage renal disease: Secondary | ICD-10-CM | POA: Diagnosis not present

## 2017-05-25 DIAGNOSIS — N2581 Secondary hyperparathyroidism of renal origin: Secondary | ICD-10-CM | POA: Diagnosis not present

## 2017-05-28 DIAGNOSIS — N2581 Secondary hyperparathyroidism of renal origin: Secondary | ICD-10-CM | POA: Diagnosis not present

## 2017-05-28 DIAGNOSIS — N186 End stage renal disease: Secondary | ICD-10-CM | POA: Diagnosis not present

## 2017-05-28 DIAGNOSIS — D631 Anemia in chronic kidney disease: Secondary | ICD-10-CM | POA: Diagnosis not present

## 2017-05-30 DIAGNOSIS — N2581 Secondary hyperparathyroidism of renal origin: Secondary | ICD-10-CM | POA: Diagnosis not present

## 2017-05-30 DIAGNOSIS — N186 End stage renal disease: Secondary | ICD-10-CM | POA: Diagnosis not present

## 2017-05-30 DIAGNOSIS — D631 Anemia in chronic kidney disease: Secondary | ICD-10-CM | POA: Diagnosis not present

## 2017-06-01 DIAGNOSIS — D631 Anemia in chronic kidney disease: Secondary | ICD-10-CM | POA: Diagnosis not present

## 2017-06-01 DIAGNOSIS — N186 End stage renal disease: Secondary | ICD-10-CM | POA: Diagnosis not present

## 2017-06-01 DIAGNOSIS — N2581 Secondary hyperparathyroidism of renal origin: Secondary | ICD-10-CM | POA: Diagnosis not present

## 2017-06-04 DIAGNOSIS — N2581 Secondary hyperparathyroidism of renal origin: Secondary | ICD-10-CM | POA: Diagnosis not present

## 2017-06-04 DIAGNOSIS — D631 Anemia in chronic kidney disease: Secondary | ICD-10-CM | POA: Diagnosis not present

## 2017-06-04 DIAGNOSIS — N186 End stage renal disease: Secondary | ICD-10-CM | POA: Diagnosis not present

## 2017-06-06 DIAGNOSIS — N186 End stage renal disease: Secondary | ICD-10-CM | POA: Diagnosis not present

## 2017-06-06 DIAGNOSIS — D631 Anemia in chronic kidney disease: Secondary | ICD-10-CM | POA: Diagnosis not present

## 2017-06-06 DIAGNOSIS — N2581 Secondary hyperparathyroidism of renal origin: Secondary | ICD-10-CM | POA: Diagnosis not present

## 2017-06-08 DIAGNOSIS — D631 Anemia in chronic kidney disease: Secondary | ICD-10-CM | POA: Diagnosis not present

## 2017-06-08 DIAGNOSIS — N186 End stage renal disease: Secondary | ICD-10-CM | POA: Diagnosis not present

## 2017-06-08 DIAGNOSIS — N2581 Secondary hyperparathyroidism of renal origin: Secondary | ICD-10-CM | POA: Diagnosis not present

## 2017-06-10 DIAGNOSIS — N2581 Secondary hyperparathyroidism of renal origin: Secondary | ICD-10-CM | POA: Diagnosis not present

## 2017-06-10 DIAGNOSIS — N186 End stage renal disease: Secondary | ICD-10-CM | POA: Diagnosis not present

## 2017-06-10 DIAGNOSIS — D631 Anemia in chronic kidney disease: Secondary | ICD-10-CM | POA: Diagnosis not present

## 2017-06-12 DIAGNOSIS — N2581 Secondary hyperparathyroidism of renal origin: Secondary | ICD-10-CM | POA: Diagnosis not present

## 2017-06-12 DIAGNOSIS — N186 End stage renal disease: Secondary | ICD-10-CM | POA: Diagnosis not present

## 2017-06-12 DIAGNOSIS — N184 Chronic kidney disease, stage 4 (severe): Secondary | ICD-10-CM | POA: Diagnosis not present

## 2017-06-12 DIAGNOSIS — D631 Anemia in chronic kidney disease: Secondary | ICD-10-CM | POA: Diagnosis not present

## 2017-06-12 DIAGNOSIS — I10 Essential (primary) hypertension: Secondary | ICD-10-CM | POA: Diagnosis not present

## 2017-06-15 DIAGNOSIS — N186 End stage renal disease: Secondary | ICD-10-CM | POA: Diagnosis not present

## 2017-06-15 DIAGNOSIS — D631 Anemia in chronic kidney disease: Secondary | ICD-10-CM | POA: Diagnosis not present

## 2017-06-15 DIAGNOSIS — N2581 Secondary hyperparathyroidism of renal origin: Secondary | ICD-10-CM | POA: Diagnosis not present

## 2017-06-18 DIAGNOSIS — N186 End stage renal disease: Secondary | ICD-10-CM | POA: Diagnosis not present

## 2017-06-18 DIAGNOSIS — D631 Anemia in chronic kidney disease: Secondary | ICD-10-CM | POA: Diagnosis not present

## 2017-06-18 DIAGNOSIS — N2581 Secondary hyperparathyroidism of renal origin: Secondary | ICD-10-CM | POA: Diagnosis not present

## 2017-06-20 DIAGNOSIS — N2581 Secondary hyperparathyroidism of renal origin: Secondary | ICD-10-CM | POA: Diagnosis not present

## 2017-06-20 DIAGNOSIS — D631 Anemia in chronic kidney disease: Secondary | ICD-10-CM | POA: Diagnosis not present

## 2017-06-20 DIAGNOSIS — N186 End stage renal disease: Secondary | ICD-10-CM | POA: Diagnosis not present

## 2017-06-21 DIAGNOSIS — I739 Peripheral vascular disease, unspecified: Secondary | ICD-10-CM | POA: Diagnosis not present

## 2017-06-21 DIAGNOSIS — Z0000 Encounter for general adult medical examination without abnormal findings: Secondary | ICD-10-CM | POA: Diagnosis not present

## 2017-06-21 DIAGNOSIS — N186 End stage renal disease: Secondary | ICD-10-CM | POA: Diagnosis not present

## 2017-06-21 DIAGNOSIS — N184 Chronic kidney disease, stage 4 (severe): Secondary | ICD-10-CM | POA: Diagnosis not present

## 2017-06-21 DIAGNOSIS — N529 Male erectile dysfunction, unspecified: Secondary | ICD-10-CM | POA: Diagnosis not present

## 2017-06-21 DIAGNOSIS — I120 Hypertensive chronic kidney disease with stage 5 chronic kidney disease or end stage renal disease: Secondary | ICD-10-CM | POA: Diagnosis not present

## 2017-06-21 DIAGNOSIS — I10 Essential (primary) hypertension: Secondary | ICD-10-CM | POA: Diagnosis not present

## 2017-06-21 DIAGNOSIS — D638 Anemia in other chronic diseases classified elsewhere: Secondary | ICD-10-CM | POA: Diagnosis not present

## 2017-06-21 DIAGNOSIS — N400 Enlarged prostate without lower urinary tract symptoms: Secondary | ICD-10-CM | POA: Diagnosis not present

## 2017-06-21 DIAGNOSIS — K581 Irritable bowel syndrome with constipation: Secondary | ICD-10-CM | POA: Diagnosis not present

## 2017-06-21 DIAGNOSIS — Z992 Dependence on renal dialysis: Secondary | ICD-10-CM | POA: Diagnosis not present

## 2017-06-22 DIAGNOSIS — D631 Anemia in chronic kidney disease: Secondary | ICD-10-CM | POA: Diagnosis not present

## 2017-06-22 DIAGNOSIS — N2581 Secondary hyperparathyroidism of renal origin: Secondary | ICD-10-CM | POA: Diagnosis not present

## 2017-06-22 DIAGNOSIS — N186 End stage renal disease: Secondary | ICD-10-CM | POA: Diagnosis not present

## 2017-06-25 DIAGNOSIS — N2581 Secondary hyperparathyroidism of renal origin: Secondary | ICD-10-CM | POA: Diagnosis not present

## 2017-06-25 DIAGNOSIS — N186 End stage renal disease: Secondary | ICD-10-CM | POA: Diagnosis not present

## 2017-06-25 DIAGNOSIS — D631 Anemia in chronic kidney disease: Secondary | ICD-10-CM | POA: Diagnosis not present

## 2017-06-26 ENCOUNTER — Other Ambulatory Visit: Payer: Self-pay | Admitting: *Deleted

## 2017-06-26 ENCOUNTER — Encounter: Payer: Self-pay | Admitting: Vascular Surgery

## 2017-06-26 ENCOUNTER — Ambulatory Visit (INDEPENDENT_AMBULATORY_CARE_PROVIDER_SITE_OTHER): Payer: Medicare Other | Admitting: Vascular Surgery

## 2017-06-26 ENCOUNTER — Ambulatory Visit (INDEPENDENT_AMBULATORY_CARE_PROVIDER_SITE_OTHER)
Admission: RE | Admit: 2017-06-26 | Discharge: 2017-06-26 | Disposition: A | Discharge status: Home / Self Care | Payer: Medicare Other | Source: Ambulatory Visit | Attending: Vascular Surgery | Admitting: Vascular Surgery

## 2017-06-26 ENCOUNTER — Ambulatory Visit (HOSPITAL_COMMUNITY)
Admission: RE | Admit: 2017-06-26 | Discharge: 2017-06-26 | Disposition: A | Discharge status: Home / Self Care | Payer: Medicare Other | Source: Ambulatory Visit | Attending: Vascular Surgery | Admitting: Vascular Surgery

## 2017-06-26 ENCOUNTER — Encounter: Payer: Self-pay | Admitting: *Deleted

## 2017-06-26 VITALS — BP 164/91 | HR 72 | Temp 97.8°F | Resp 20 | Ht 70.0 in | Wt 196.0 lb

## 2017-06-26 DIAGNOSIS — Z01810 Encounter for preprocedural cardiovascular examination: Secondary | ICD-10-CM | POA: Diagnosis not present

## 2017-06-26 DIAGNOSIS — N184 Chronic kidney disease, stage 4 (severe): Secondary | ICD-10-CM | POA: Diagnosis not present

## 2017-06-26 DIAGNOSIS — N186 End stage renal disease: Secondary | ICD-10-CM

## 2017-06-26 NOTE — Progress Notes (Signed)
Patient name: Andrew Brandt MRN: [id number] DOB: 1951-05-05 Sex: male   REASON FOR CONSULT:    To evaluate for hemodialysis access.  The consult is requested by Dr. Augustin Coupe  HPI:   Andrew Brandt is a pleasant 66 y.o. male, who had a right IJ tunneled dialysis catheter and a left radiocephalic fistula placed by Dr. Sherren Mocha Early on 11/23/2013.  He had used this for some time but most recently this clotted and was not salvageable.  He had a tunneled dialysis catheter placed at that time.  I have reviewed the records from [facility].  The patient did undergo a recent procedure on 05/08/2017.  This was an unsuccessful attempt at percutaneous thrombectomy.  The patient is referred for evaluation for new access.  He denies any recent uremic symptoms.  Specifically, he denies nausea, vomiting, fatigue, anorexia, or palpitations.  Past Medical History:  Diagnosis Date  . Chronic kidney disease    Dialysis Tues-Thurs.[facility] [address]  . Hypertension     Family History  Problem Relation Age of Onset  . Cancer Mother   . Diabetes Unknown        grandparents    SOCIAL HISTORY: Social History   Socioeconomic History  . Marital status: Divorced    Spouse name: Not on file  . Number of children: Not on file  . Years of education: Not on file  . Highest education level: Not on file  Social Needs  . Financial resource strain: Not on file  . Food insecurity - worry: Not on file  . Food insecurity - inability: Not on file  . Transportation needs - medical: Not on file  . Transportation needs - non-medical: Not on file  Occupational History  . Not on file  Tobacco Use  . Smoking status: Former Research scientist (life sciences)  . Smokeless tobacco: Never Used  Substance and Sexual Activity  . Alcohol use: Yes    Comment: Occ. beer  . Drug use: No  . Sexual activity: Not on file  Other Topics Concern  . 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 acetaminophen (TYLENOL) 500 MG tablet Take 1,000 mg by mouth every 6 (six) hours as needed for mild pain.    Marland Kitchen AMITIZA 24 MCG capsule     . calcitRIOL (ROCALTROL) 0.5 MCG capsule Take 1 capsule (0.5 mcg total) by mouth daily. 30 capsule 0   No current facility-administered medications for this visit.     REVIEW OF SYSTEMS:  [X]  denotes positive finding, [ ]  denotes negative finding Cardiac  Comments:  Chest pain or chest pressure:    Shortness of breath upon exertion:    Short of breath when lying flat:    Irregular heart rhythm:        Vascular    Pain in calf, thigh, or hip brought on by ambulation:    Pain in feet at night that wakes you up from your sleep:     Blood clot in your veins:    Leg swelling:         Pulmonary    Oxygen at home:    Productive cough:     Wheezing:  Neurologic    Sudden weakness in arms or legs:     Sudden numbness in arms or legs:     Sudden onset of difficulty speaking or slurred speech:    Temporary loss of vision in one eye:     Problems with dizziness:         Gastrointestinal    Blood in stool:     Vomited blood:         Genitourinary    Burning when urinating:     Blood in urine:        Psychiatric    Major depression:         Hematologic    Bleeding problems:    Problems with blood clotting too easily:        Skin    Rashes or ulcers:        Constitutional    Fever or chills:     PHYSICAL EXAM:   Vitals:   06/26/17 0904 06/26/17 0905  BP: (!) 161/90 (!) 164/91  Pulse: 72   Resp: 20   Temp: 97.8 F (36.6 C)   TempSrc: Oral   SpO2: 100%   Weight: 196 lb (88.9 kg)   Height: 5\' 10"  (1.778 m)     GENERAL: The patient is a well-nourished male, in no acute distress. The vital signs are documented  above. CARDIAC: There is a regular rate and rhythm.  VASCULAR: I do not detect carotid bruits. He has palpable brachial and radial pulses bilaterally. He is left forearm AV fistula is thrombosed. PULMONARY: There is good air exchange bilaterally without wheezing or rales. ABDOMEN: Soft and non-tender with normal pitched bowel sounds.  MUSCULOSKELETAL: There are no major deformities or cyanosis. NEUROLOGIC: No focal weakness or paresthesias are detected. SKIN: There are no ulcers or rashes noted. PSYCHIATRIC: The patient has a normal affect.  DATA:    ARTERIAL DUPLEX: I have independently interpreted his arterial duplex scan today.  On the right side there is a triphasic radial and ulnar signal.  The brachial artery on the right measures 0.5 cm in diameter.  On the left side there is a triphasic radial and ulnar signal.  The diameter of the brachial artery measures 0.55 cm.  BILATERAL UPPER EXTREMITY VEIN MAP: I have independently interpreted his bilateral upper extremity vein map.  On the left side, he has an old left forearm fistula.  The upper arm cephalic vein is not visualized.  The basilic vein looks marginal on the left.  On the right side he has a small forearm cephalic vein in the upper arm cephalic vein is not visualized.  The basilic vein on the right looks reasonable.  MEDICAL ISSUES:   END-STAGE RENAL DISEASE: I have explained that his options for access are either to place an AV graft in the left arm or he could potentially have a basilic vein transposition on the right.  He does not appear to have any further options for fistula in the left arm.  He is very reluctant to consider access in his right arm and would prefer to have a graft placed on the left.  I think this is reasonable.  I have explained the indications for placement of an AV fistula or AV graft. I've explained that if at all possible we will place an AV fistula.  I have reviewed the risks of placement of an  AV fistula including but not limited to: failure of the fistula to mature, need for subsequent interventions, and  thrombosis. In addition I have reviewed the potential complications of placement of an AV graft. These risks include, but are not limited to, graft thrombosis, graft infection, wound healing problems, bleeding, arm swelling, and steal syndrome. All the patient's questions were answered and they are agreeable to proceed with surgery.  His surgery is scheduled for 07/12/2017.  I encouraged him to try to schedule this sooner but he needs a little time to think about it.  [name] [facility] [phone]

## 2017-06-26 NOTE — H&P (View-Only) (Signed)
Patient name: Andrew Brandt MRN: [id number] DOB: 11-Nov-1950 Sex: male   REASON FOR CONSULT:    To evaluate for hemodialysis access.  The consult is requested by Dr. Augustin Coupe  HPI:   TOBY BREITHAUPT is a pleasant 66 y.o. male, who had a right IJ tunneled dialysis catheter and a left radiocephalic fistula placed by Dr. Sherren Mocha Early on 11/23/2013.  He had used this for some time but most recently this clotted and was not salvageable.  He had a tunneled dialysis catheter placed at that time.  I have reviewed the records from [facility].  The patient did undergo a recent procedure on 05/08/2017.  This was an unsuccessful attempt at percutaneous thrombectomy.  The patient is referred for evaluation for new access.  He denies any recent uremic symptoms.  Specifically, he denies nausea, vomiting, fatigue, anorexia, or palpitations.  Past Medical History:  Diagnosis Date  . Chronic kidney disease    Dialysis Tues-Thurs.[facility] [address]  . Hypertension     Family History  Problem Relation Age of Onset  . Cancer Mother   . Diabetes Unknown        grandparents    SOCIAL HISTORY: Social History   Socioeconomic History  . Marital status: Divorced    Spouse name: Not on file  . Number of children: Not on file  . Years of education: Not on file  . Highest education level: Not on file  Social Needs  . Financial resource strain: Not on file  . Food insecurity - worry: Not on file  . Food insecurity - inability: Not on file  . Transportation needs - medical: Not on file  . Transportation needs - non-medical: Not on file  Occupational History  . Not on file  Tobacco Use  . Smoking status: Former Research scientist (life sciences)  . Smokeless tobacco: Never Used  Substance and Sexual Activity  . Alcohol use: Yes    Comment: Occ. beer  . Drug use: No  . Sexual activity: Not on file  Other Topics Concern  . 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 acetaminophen (TYLENOL) 500 MG tablet Take 1,000 mg by mouth every 6 (six) hours as needed for mild pain.    Marland Kitchen AMITIZA 24 MCG capsule     . calcitRIOL (ROCALTROL) 0.5 MCG capsule Take 1 capsule (0.5 mcg total) by mouth daily. 30 capsule 0   No current facility-administered medications for this visit.     REVIEW OF SYSTEMS:  [X]  denotes positive finding, [ ]  denotes negative finding Cardiac  Comments:  Chest pain or chest pressure:    Shortness of breath upon exertion:    Short of breath when lying flat:    Irregular heart rhythm:        Vascular    Pain in calf, thigh, or hip brought on by ambulation:    Pain in feet at night that wakes you up from your sleep:     Blood clot in your veins:    Leg swelling:         Pulmonary    Oxygen at home:    Productive cough:     Wheezing:  Neurologic    Sudden weakness in arms or legs:     Sudden numbness in arms or legs:     Sudden onset of difficulty speaking or slurred speech:    Temporary loss of vision in one eye:     Problems with dizziness:         Gastrointestinal    Blood in stool:     Vomited blood:         Genitourinary    Burning when urinating:     Blood in urine:        Psychiatric    Major depression:         Hematologic    Bleeding problems:    Problems with blood clotting too easily:        Skin    Rashes or ulcers:        Constitutional    Fever or chills:     PHYSICAL EXAM:   Vitals:   06/26/17 0904 06/26/17 0905  BP: (!) 161/90 (!) 164/91  Pulse: 72   Resp: 20   Temp: 97.8 F (36.6 C)   TempSrc: Oral   SpO2: 100%   Weight: 196 lb (88.9 kg)   Height: 5\' 10"  (1.778 m)     GENERAL: The patient is a well-nourished male, in no acute distress. The vital signs are documented  above. CARDIAC: There is a regular rate and rhythm.  VASCULAR: I do not detect carotid bruits. He has palpable brachial and radial pulses bilaterally. He is left forearm AV fistula is thrombosed. PULMONARY: There is good air exchange bilaterally without wheezing or rales. ABDOMEN: Soft and non-tender with normal pitched bowel sounds.  MUSCULOSKELETAL: There are no major deformities or cyanosis. NEUROLOGIC: No focal weakness or paresthesias are detected. SKIN: There are no ulcers or rashes noted. PSYCHIATRIC: The patient has a normal affect.  DATA:    ARTERIAL DUPLEX: I have independently interpreted his arterial duplex scan today.  On the right side there is a triphasic radial and ulnar signal.  The brachial artery on the right measures 0.5 cm in diameter.  On the left side there is a triphasic radial and ulnar signal.  The diameter of the brachial artery measures 0.55 cm.  BILATERAL UPPER EXTREMITY VEIN MAP: I have independently interpreted his bilateral upper extremity vein map.  On the left side, he has an old left forearm fistula.  The upper arm cephalic vein is not visualized.  The basilic vein looks marginal on the left.  On the right side he has a small forearm cephalic vein in the upper arm cephalic vein is not visualized.  The basilic vein on the right looks reasonable.  MEDICAL ISSUES:   END-STAGE RENAL DISEASE: I have explained that his options for access are either to place an AV graft in the left arm or he could potentially have a basilic vein transposition on the right.  He does not appear to have any further options for fistula in the left arm.  He is very reluctant to consider access in his right arm and would prefer to have a graft placed on the left.  I think this is reasonable.  I have explained the indications for placement of an AV fistula or AV graft. I've explained that if at all possible we will place an AV fistula.  I have reviewed the risks of placement of an  AV fistula including but not limited to: failure of the fistula to mature, need for subsequent interventions, and  thrombosis. In addition I have reviewed the potential complications of placement of an AV graft. These risks include, but are not limited to, graft thrombosis, graft infection, wound healing problems, bleeding, arm swelling, and steal syndrome. All the patient's questions were answered and they are agreeable to proceed with surgery.  His surgery is scheduled for 07/12/2017.  I encouraged him to try to schedule this sooner but he needs a little time to think about it.  [name] [facility] [phone]

## 2017-06-27 DIAGNOSIS — N186 End stage renal disease: Secondary | ICD-10-CM | POA: Diagnosis not present

## 2017-06-27 DIAGNOSIS — N2581 Secondary hyperparathyroidism of renal origin: Secondary | ICD-10-CM | POA: Diagnosis not present

## 2017-06-27 DIAGNOSIS — D631 Anemia in chronic kidney disease: Secondary | ICD-10-CM | POA: Diagnosis not present

## 2017-06-28 ENCOUNTER — Encounter: Payer: Self-pay | Admitting: Nephrology

## 2017-06-29 DIAGNOSIS — N186 End stage renal disease: Secondary | ICD-10-CM | POA: Diagnosis not present

## 2017-06-29 DIAGNOSIS — N2581 Secondary hyperparathyroidism of renal origin: Secondary | ICD-10-CM | POA: Diagnosis not present

## 2017-06-29 DIAGNOSIS — D631 Anemia in chronic kidney disease: Secondary | ICD-10-CM | POA: Diagnosis not present

## 2017-07-02 DIAGNOSIS — D631 Anemia in chronic kidney disease: Secondary | ICD-10-CM | POA: Diagnosis not present

## 2017-07-02 DIAGNOSIS — N2581 Secondary hyperparathyroidism of renal origin: Secondary | ICD-10-CM | POA: Diagnosis not present

## 2017-07-02 DIAGNOSIS — N186 End stage renal disease: Secondary | ICD-10-CM | POA: Diagnosis not present

## 2017-07-04 DIAGNOSIS — N2581 Secondary hyperparathyroidism of renal origin: Secondary | ICD-10-CM | POA: Diagnosis not present

## 2017-07-04 DIAGNOSIS — D631 Anemia in chronic kidney disease: Secondary | ICD-10-CM | POA: Diagnosis not present

## 2017-07-04 DIAGNOSIS — N186 End stage renal disease: Secondary | ICD-10-CM | POA: Diagnosis not present

## 2017-07-06 DIAGNOSIS — N186 End stage renal disease: Secondary | ICD-10-CM | POA: Diagnosis not present

## 2017-07-06 DIAGNOSIS — D631 Anemia in chronic kidney disease: Secondary | ICD-10-CM | POA: Diagnosis not present

## 2017-07-06 DIAGNOSIS — N2581 Secondary hyperparathyroidism of renal origin: Secondary | ICD-10-CM | POA: Diagnosis not present

## 2017-07-09 DIAGNOSIS — N2581 Secondary hyperparathyroidism of renal origin: Secondary | ICD-10-CM | POA: Diagnosis not present

## 2017-07-09 DIAGNOSIS — N186 End stage renal disease: Secondary | ICD-10-CM | POA: Diagnosis not present

## 2017-07-09 DIAGNOSIS — D631 Anemia in chronic kidney disease: Secondary | ICD-10-CM | POA: Diagnosis not present

## 2017-07-10 ENCOUNTER — Other Ambulatory Visit: Payer: Self-pay

## 2017-07-10 ENCOUNTER — Encounter (HOSPITAL_COMMUNITY): Payer: Self-pay | Admitting: *Deleted

## 2017-07-10 NOTE — Progress Notes (Signed)
Spoke with pt for pre-op call. Pt denies cardiac history, chest pain, sob or diabetes. 

## 2017-07-11 DIAGNOSIS — N2581 Secondary hyperparathyroidism of renal origin: Secondary | ICD-10-CM | POA: Diagnosis not present

## 2017-07-11 DIAGNOSIS — D631 Anemia in chronic kidney disease: Secondary | ICD-10-CM | POA: Diagnosis not present

## 2017-07-11 DIAGNOSIS — N186 End stage renal disease: Secondary | ICD-10-CM | POA: Diagnosis not present

## 2017-07-12 ENCOUNTER — Ambulatory Visit (HOSPITAL_COMMUNITY): Payer: Medicare Other | Admitting: Anesthesiology

## 2017-07-12 ENCOUNTER — Encounter (HOSPITAL_COMMUNITY)
Admission: RE | Disposition: A | Discharge status: Home / Self Care | Payer: Self-pay | Source: Ambulatory Visit | Attending: Vascular Surgery

## 2017-07-12 ENCOUNTER — Other Ambulatory Visit: Payer: Self-pay

## 2017-07-12 ENCOUNTER — Encounter (HOSPITAL_COMMUNITY): Payer: Self-pay | Admitting: *Deleted

## 2017-07-12 ENCOUNTER — Ambulatory Visit (HOSPITAL_COMMUNITY)
Admission: RE | Admit: 2017-07-12 | Discharge: 2017-07-12 | Disposition: A | Discharge status: Home / Self Care | Payer: Medicare Other | Source: Ambulatory Visit | Attending: Vascular Surgery | Admitting: Vascular Surgery

## 2017-07-12 DIAGNOSIS — Z79899 Other long term (current) drug therapy: Secondary | ICD-10-CM | POA: Diagnosis not present

## 2017-07-12 DIAGNOSIS — I120 Hypertensive chronic kidney disease with stage 5 chronic kidney disease or end stage renal disease: Secondary | ICD-10-CM | POA: Diagnosis not present

## 2017-07-12 DIAGNOSIS — D649 Anemia, unspecified: Secondary | ICD-10-CM | POA: Diagnosis not present

## 2017-07-12 DIAGNOSIS — N186 End stage renal disease: Secondary | ICD-10-CM | POA: Diagnosis not present

## 2017-07-12 DIAGNOSIS — Z87891 Personal history of nicotine dependence: Secondary | ICD-10-CM | POA: Insufficient documentation

## 2017-07-12 DIAGNOSIS — Z992 Dependence on renal dialysis: Secondary | ICD-10-CM | POA: Diagnosis not present

## 2017-07-12 DIAGNOSIS — N179 Acute kidney failure, unspecified: Secondary | ICD-10-CM | POA: Diagnosis not present

## 2017-07-12 HISTORY — PX: AV FISTULA PLACEMENT: SHX1204

## 2017-07-12 HISTORY — DX: Constipation, unspecified: K59.00

## 2017-07-12 HISTORY — DX: Malignant (primary) neoplasm, unspecified: C80.1

## 2017-07-12 LAB — POCT I-STAT 4, (NA,K, GLUC, HGB,HCT)
Glucose, Bld: 87 mg/dL (ref 65–99)
HCT: 37 % — ABNORMAL LOW (ref 39.0–52.0)
Hemoglobin: 12.6 g/dL — ABNORMAL LOW (ref 13.0–17.0)
Potassium: 3.6 mmol/L (ref 3.5–5.1)
Sodium: 143 mmol/L (ref 135–145)

## 2017-07-12 SURGERY — INSERTION OF ARTERIOVENOUS (AV) GORE-TEX GRAFT ARM
Anesthesia: General | Site: Arm Upper | Laterality: Left

## 2017-07-12 MED ORDER — DEXTROSE 5 % IV SOLN
INTRAVENOUS | Status: AC
  Filled 2017-07-12: qty 1.5

## 2017-07-12 MED ORDER — LIDOCAINE-EPINEPHRINE (PF) 1 %-1:200000 IJ SOLN
INTRAMUSCULAR | Status: AC
  Filled 2017-07-12: qty 30

## 2017-07-12 MED ORDER — PROTAMINE SULFATE 10 MG/ML IV SOLN
INTRAVENOUS | Status: DC | PRN
  Administered 2017-07-12: 50 mg via INTRAVENOUS

## 2017-07-12 MED ORDER — PHENYLEPHRINE 40 MCG/ML (10ML) SYRINGE FOR IV PUSH (FOR BLOOD PRESSURE SUPPORT)
PREFILLED_SYRINGE | INTRAVENOUS | Status: AC
  Filled 2017-07-12: qty 10

## 2017-07-12 MED ORDER — CHLORHEXIDINE GLUCONATE 4 % EX LIQD: 60.0000 mL | Freq: Once | CUTANEOUS | Status: DC

## 2017-07-12 MED ORDER — LIDOCAINE 2% (20 MG/ML) 5 ML SYRINGE
INTRAMUSCULAR | Status: DC | PRN
  Administered 2017-07-12: 60 mg via INTRAVENOUS

## 2017-07-12 MED ORDER — ONDANSETRON HCL 4 MG/2ML IJ SOLN
INTRAMUSCULAR | Status: DC | PRN
  Administered 2017-07-12: 4 mg via INTRAVENOUS

## 2017-07-12 MED ORDER — PROPOFOL 10 MG/ML IV BOLUS
INTRAVENOUS | Status: DC | PRN
  Administered 2017-07-12: 150 mg via INTRAVENOUS
  Administered 2017-07-12: 20 mg via INTRAVENOUS

## 2017-07-12 MED ORDER — CHLORHEXIDINE GLUCONATE 4 % EX LIQD
60.0000 mL | Freq: Once | CUTANEOUS | Status: DC
Start: 2017-07-13 — End: 2017-07-12

## 2017-07-12 MED ORDER — FENTANYL CITRATE (PF) 100 MCG/2ML IJ SOLN
INTRAMUSCULAR | Status: DC | PRN
  Administered 2017-07-12 (×2): 50 ug via INTRAVENOUS

## 2017-07-12 MED ORDER — PHENYLEPHRINE 40 MCG/ML (10ML) SYRINGE FOR IV PUSH (FOR BLOOD PRESSURE SUPPORT)
PREFILLED_SYRINGE | INTRAVENOUS | Status: DC | PRN
  Administered 2017-07-12 (×4): 80 ug via INTRAVENOUS

## 2017-07-12 MED ORDER — FENTANYL CITRATE (PF) 250 MCG/5ML IJ SOLN
INTRAMUSCULAR | Status: AC
  Filled 2017-07-12: qty 5

## 2017-07-12 MED ORDER — HYDROMORPHONE HCL 1 MG/ML IJ SOLN
INTRAMUSCULAR | Status: AC
  Filled 2017-07-12: qty 1

## 2017-07-12 MED ORDER — HYDROMORPHONE HCL 1 MG/ML IJ SOLN
0.2500 mg | INTRAMUSCULAR | Status: DC | PRN
  Administered 2017-07-12 (×2): 0.5 mg via INTRAVENOUS

## 2017-07-12 MED ORDER — SODIUM CHLORIDE 0.9 % IV SOLN
INTRAVENOUS | Status: DC
  Administered 2017-07-12 (×2): via INTRAVENOUS

## 2017-07-12 MED ORDER — PROPOFOL 10 MG/ML IV BOLUS
INTRAVENOUS | Status: AC
  Filled 2017-07-12: qty 20

## 2017-07-12 MED ORDER — DEXTROSE 5 % IV SOLN
1.5000 g | INTRAVENOUS | Status: AC
  Administered 2017-07-12: 1.5 g via INTRAVENOUS

## 2017-07-12 MED ORDER — LIDOCAINE 2% (20 MG/ML) 5 ML SYRINGE
INTRAMUSCULAR | Status: AC
  Filled 2017-07-12: qty 5

## 2017-07-12 MED ORDER — SODIUM CHLORIDE 0.9 % IV SOLN
INTRAVENOUS | Status: DC | PRN
  Administered 2017-07-12: 500 mL

## 2017-07-12 MED ORDER — MIDAZOLAM HCL 2 MG/2ML IJ SOLN
INTRAMUSCULAR | Status: AC
  Filled 2017-07-12: qty 2

## 2017-07-12 MED ORDER — ONDANSETRON HCL 4 MG/2ML IJ SOLN
INTRAMUSCULAR | Status: AC
  Filled 2017-07-12: qty 2

## 2017-07-12 MED ORDER — THROMBIN (RECOMBINANT) 5000 UNITS EX SOLR
CUTANEOUS | Status: AC
  Filled 2017-07-12: qty 5000

## 2017-07-12 MED ORDER — 0.9 % SODIUM CHLORIDE (POUR BTL) OPTIME
TOPICAL | Status: DC | PRN
  Administered 2017-07-12: 1000 mL

## 2017-07-12 MED ORDER — DEXAMETHASONE SODIUM PHOSPHATE 10 MG/ML IJ SOLN
INTRAMUSCULAR | Status: AC
  Filled 2017-07-12: qty 1

## 2017-07-12 MED ORDER — OXYCODONE-ACETAMINOPHEN 5-325 MG PO TABS: 1.0000 | ORAL_TABLET | Freq: Four times a day (QID) | ORAL | 0 refills | Status: DC | PRN

## 2017-07-12 MED ORDER — MEPERIDINE HCL 25 MG/ML IJ SOLN: 6.2500 mg | INTRAMUSCULAR | Status: DC | PRN

## 2017-07-12 MED ORDER — HEPARIN SODIUM (PORCINE) 1000 UNIT/ML IJ SOLN
INTRAMUSCULAR | Status: DC | PRN
  Administered 2017-07-12: 5000 [IU] via INTRAVENOUS

## 2017-07-12 MED ORDER — ONDANSETRON HCL 4 MG/2ML IJ SOLN: 4.0000 mg | Freq: Once | INTRAMUSCULAR | Status: DC | PRN

## 2017-07-12 MED ORDER — SUCCINYLCHOLINE CHLORIDE 200 MG/10ML IV SOSY
PREFILLED_SYRINGE | INTRAVENOUS | Status: AC
  Filled 2017-07-12: qty 30

## 2017-07-12 SURGICAL SUPPLY — 27 items
ARMBAND PINK RESTRICT EXTREMIT (MISCELLANEOUS) ×4 IMPLANT
CANISTER SUCT 3000ML PPV (MISCELLANEOUS) ×2 IMPLANT
CANNULA VESSEL 3MM 2 BLNT TIP (CANNULA) ×2 IMPLANT
CLIP VESOCCLUDE MED 6/CT (CLIP) ×2 IMPLANT
CLIP VESOCCLUDE SM WIDE 6/CT (CLIP) ×2 IMPLANT
DECANTER SPIKE VIAL GLASS SM (MISCELLANEOUS) ×2 IMPLANT
DERMABOND ADVANCED (GAUZE/BANDAGES/DRESSINGS) ×1
DERMABOND ADVANCED .7 DNX12 (GAUZE/BANDAGES/DRESSINGS) ×1 IMPLANT
ELECT REM PT RETURN 9FT ADLT (ELECTROSURGICAL) ×2
ELECTRODE REM PT RTRN 9FT ADLT (ELECTROSURGICAL) ×1 IMPLANT
GLOVE BIO SURGEON STRL SZ7.5 (GLOVE) ×2 IMPLANT
GOWN STRL REUS W/ TWL LRG LVL3 (GOWN DISPOSABLE) ×3 IMPLANT
GOWN STRL REUS W/TWL LRG LVL3 (GOWN DISPOSABLE) ×3
GRAFT GORETEX STRT 4-7X45 (Vascular Products) ×2 IMPLANT
KIT BASIN OR (CUSTOM PROCEDURE TRAY) ×2 IMPLANT
KIT ROOM TURNOVER OR (KITS) ×2 IMPLANT
NS IRRIG 1000ML POUR BTL (IV SOLUTION) ×2 IMPLANT
PACK CV ACCESS (CUSTOM PROCEDURE TRAY) ×2 IMPLANT
PAD ARMBOARD 7.5X6 YLW CONV (MISCELLANEOUS) ×4 IMPLANT
SUT PROLENE 6 0 CC (SUTURE) ×4 IMPLANT
SUT VIC AB 3-0 SH 27 (SUTURE) ×2
SUT VIC AB 3-0 SH 27X BRD (SUTURE) ×2 IMPLANT
SUT VICRYL 4-0 PS2 18IN ABS (SUTURE) ×4 IMPLANT
SYR TOOMEY 50ML (SYRINGE) IMPLANT
TOWEL GREEN STERILE (TOWEL DISPOSABLE) ×2 IMPLANT
UNDERPAD 30X30 (UNDERPADS AND DIAPERS) ×2 IMPLANT
WATER STERILE IRR 1000ML POUR (IV SOLUTION) ×2 IMPLANT

## 2017-07-12 NOTE — Interval H&P Note (Signed)
History and Physical Interval Note:  07/12/2017 11:36 AM  Andrew Brandt  has presented today for surgery, with the diagnosis of end stage renal disease  The various methods of treatment have been discussed with the patient and family. After consideration of risks, benefits and other options for treatment, the patient has consented to  Procedure(s): INSERTION OF ARTERIOVENOUS (AV) GORE-TEX GRAFT ARM (Left) as a surgical intervention .  The patient's history has been reviewed, patient examined, no change in status, stable for surgery.  I have reviewed the patient's chart and labs.  Questions were answered to the patient's satisfaction.     [name]

## 2017-07-12 NOTE — Op Note (Signed)
Procedure: Left Upper Arm AV graft  Preop: ESRD  Postop: ESRD  Anesthesia: General  Findings:4-7 mm PTFE end to side to axillary vein  Asst: Leontine Locket PA-C   Procedure Details: The left upper extremity was prepped and draped in usual sterile fashion.  A longitudinal incision was then made near the antecubital crease the left arm.  There were no suitable antecubital veins for outflow.   The incision was carried into the subcutaneous tissues down to level of the brachial artery.  Next the brachial artery was dissected free in the medial portion incision. The artery was 3 mm in diameter. The vessel loops were placed proximal and distal to the planned site of arteriotomy. At this point, a longitudinal incision was made in the axilla and carried through the subcutaneous tissues and fascia to expose the axillary vein.  The nerves were protected.  The vein was approximately 4-5 mm in diameter. Next, a subcutaneous tunnel was created connecting the upper arm to the lower arm incision in an arcing configuration over the biceps muscle.  A 4-7 mm PTFE graft was then brought through this subcutaneous tunnel. The patient was given 5000 units of intravenous heparin. After appropriate circulation time, the vessel loops were used to control the artery. A longitudinal opening was made in the left brachial artery.  The 4 mm end of the graft was beveled and sewn end to side to the artery using a 6 0 prolene.  At completion of the anastomosis the artery was forward bled, backbled and thoroughly flushed.  The anastomosis was secured, vessel loops were released and there was palpable pulse in the graft.  The graft was clamped just above the arterial anastomosis with a fistula clamp. The graft was then pulled taut to length at the axillary incision.  The axillary vein was controlled proximally and distally with a fine bulldog clamp in the upper axilla.  The vein was opened longitudinally.  The distal end of the graft  was then beveled and sewn end to side to the vein using a running 6 0 prolene.  Just prior to completion of the anastomosis, everything was forward bled, back bled and thoroughly flushed.  The anastomosis was secured and the fistula clamp removed from the proximal graft.  A thrill was immediately palpable in the graft. The patient was given 50 mg of protamine to assist with hemostasis.  After hemostasis was obtained, the subcutaneous tissues were reapproximated using a running 3-0 Vicryl suture. The skin was then closed with a 4 0 Vicryl subcuticular stitch. Dermabond was applied to the skin incisions.  The patient tolerated the procedure well and there were no complications.  Instrument sponge and needle count was correct at the end of the case.  The patient was taken to the recovery room in stable condition. The patient had audible radial and ulnar doppler signals at the end of the case.  [name], MD [facility] [place] Office: [phone] Pager: [phone]

## 2017-07-12 NOTE — Transfer of Care (Signed)
Immediate Anesthesia Transfer of Care Note  Patient: Andrew Brandt  Procedure(s) Performed: INSERTION OF ARTERIOVENOUS (AV) GORE-TEX GRAFT ARM LEFT UPPER ARM (Left Arm Upper)  Patient Location: PACU  Anesthesia Type:General  Level of Consciousness: awake and alert   Airway & Oxygen Therapy: Patient Spontanous Breathing and Patient connected to face mask oxygen  Post-op Assessment: Report given to RN and Post -op Vital signs reviewed and stable  Post vital signs: Reviewed and stable  Last Vitals:  Vitals:   07/12/17 0842  BP: 138/83  Pulse: 71  Resp: 18  Temp: 36.7 C  SpO2: 98%    Last Pain:  Vitals:   07/12/17 0842  TempSrc: Oral      Patients Stated Pain Goal: 2 (34/37/35 7897)  Complications: No apparent anesthesia complications

## 2017-07-12 NOTE — Anesthesia Preprocedure Evaluation (Signed)
Anesthesia Evaluation  Patient identified by MRN, date of birth, ID band Patient awake    Reviewed: Allergy & Precautions, NPO status , Patient's Chart, lab work & pertinent test results  Airway Mallampati: I  TM Distance: >3 FB Neck ROM: Full    Dental   Pulmonary former smoker,    Pulmonary exam normal        Cardiovascular hypertension, Pt. on medications Normal cardiovascular exam     Neuro/Psych    GI/Hepatic   Endo/Other    Renal/GU Dialysis and ESRFRenal disease     Musculoskeletal   Abdominal   Peds  Hematology   Anesthesia Other Findings   Reproductive/Obstetrics                             Anesthesia Physical Anesthesia Plan  ASA: III  Anesthesia Plan: General   Post-op Pain Management:    Induction: Intravenous  PONV Risk Score and Plan: 2 and Ondansetron and Treatment may vary due to age or medical condition  Airway Management Planned: LMA  Additional Equipment:   Intra-op Plan:   Post-operative Plan: Extubation in OR  Informed Consent: I have reviewed the patients History and Physical, chart, labs and discussed the procedure including the risks, benefits and alternatives for the proposed anesthesia with the patient or authorized representative who has indicated his/her understanding and acceptance.     Plan Discussed with: CRNA and Surgeon  Anesthesia Plan Comments:         Anesthesia Quick Evaluation

## 2017-07-12 NOTE — Discharge Instructions (Signed)
° °  [facility] of [facility]  Discharge Instructions  AV Fistula or Graft Surgery for Dialysis Access  Please refer to the following instructions for your post-procedure care. Your surgeon or physician assistant will discuss any changes with you.  Activity  You may drive the day following your surgery, if you are comfortable and no longer taking prescription pain medication. Resume full activity as the soreness in your incision resolves.  Bathing/Showering  You may shower after you go home. Keep your incision dry for 48 hours. Do not soak in a bathtub, hot tub, or swim until the incision heals completely. You may not shower if you have a hemodialysis catheter.  Incision Care  Clean your incision with mild soap and water after 48 hours. Pat the area dry with a clean towel. You do not need a bandage unless otherwise instructed. Do not apply any ointments or creams to your incision. You may have skin glue on your incision. Do not peel it off. It will come off on its own in about one week. Your arm may swell a bit after surgery. To reduce swelling use pillows to elevate your arm so it is above your heart. Your doctor will tell you if you need to lightly wrap your arm with an ACE bandage.  Diet  Resume your normal diet. There are not special food restrictions following this procedure. In order to heal from your surgery, it is CRITICAL to get adequate nutrition. Your body requires vitamins, minerals, and protein. Vegetables are the best source of vitamins and minerals. Vegetables also provide the perfect balance of protein. Processed food has little nutritional value, so try to avoid this.  Medications  Resume taking all of your medications. If your incision is causing pain, you may take over-the counter pain relievers such as acetaminophen (Tylenol). If you were prescribed a stronger pain medication, please be aware these medications can cause nausea and constipation. Prevent  nausea by taking the medication with a snack or meal. Avoid constipation by drinking plenty of fluids and eating foods with high amount of fiber, such as fruits, vegetables, and grains. Do not take Tylenol if you are taking prescription pain medications.  Follow up Your surgeon may want to see you in the office following your access surgery. If so, this will be arranged at the time of your surgery.  Please call us immediately for any of the following conditions:  Increased pain, redness, drainage (pus) from your incision site Fever of 101 degrees or higher Severe or worsening pain at your incision site Hand pain or numbness.  Reduce your risk of vascular disease:  Stop smoking. If you would like help, call [facility] at 1-800-QUIT-NOW 709-026-9652) or [place] at [place] your cholesterol Maintain a desired weight Control your diabetes Keep your blood pressure down  Dialysis  It will take several weeks to several months for your new dialysis access to be ready for use. Your surgeon will determine when it is OK to use it. Your nephrologist will continue to direct your dialysis. You can continue to use your Permcath until your new access is ready for use.   07/12/2017 Andrew Brandt [id number] December 24, 1950  Surgeon(s): Fields, Jessy Oto, MD  Procedure(s): INSERTION OF ARTERIOVENOUS (AV) GORE-TEX GRAFT ARM LEFT UPPER ARM  x Do not stick graft for 4 weeks    If you have any questions, please call the office at [phone].

## 2017-07-12 NOTE — Anesthesia Postprocedure Evaluation (Signed)
Anesthesia Post Note  Patient: Andrew Brandt  Procedure(s) Performed: INSERTION OF ARTERIOVENOUS (AV) GORE-TEX GRAFT ARM LEFT UPPER ARM (Left Arm Upper)     Patient location during evaluation: PACU Anesthesia Type: General Level of consciousness: awake and alert Pain management: pain level controlled Vital Signs Assessment: post-procedure vital signs reviewed and stable Respiratory status: spontaneous breathing, nonlabored ventilation and respiratory function stable Cardiovascular status: blood pressure returned to baseline and stable Postop Assessment: no apparent nausea or vomiting Anesthetic complications: no    Last Vitals:  Vitals:   07/12/17 1400 07/12/17 1415  BP: 140/76 134/80  Pulse: 74 70  Resp: 16 12  Temp:    SpO2: 93% 92%    Last Pain:  Vitals:   07/12/17 1400  TempSrc:   PainSc: [place]

## 2017-07-12 NOTE — Anesthesia Procedure Notes (Signed)
Procedure Name: LMA Insertion Date/Time: 07/12/2017 11:51 AM Performed by: Lieutenant Diego, CRNA Pre-anesthesia Checklist: Patient identified, Emergency Drugs available, Suction available and Patient being monitored Patient Re-evaluated:Patient Re-evaluated prior to induction Oxygen Delivery Method: Circle system utilized Preoxygenation: Pre-oxygenation with 100% oxygen Induction Type: IV induction Ventilation: Mask ventilation without difficulty LMA: LMA inserted LMA Size: 5.0 Number of attempts: 1 Placement Confirmation: positive ETCO2 and breath sounds checked- equal and bilateral Tube secured with: Tape Dental Injury: Teeth and Oropharynx as per pre-operative assessment

## 2017-07-12 NOTE — Progress Notes (Signed)
[facility]

## 2017-07-13 DIAGNOSIS — N186 End stage renal disease: Secondary | ICD-10-CM | POA: Diagnosis not present

## 2017-07-13 DIAGNOSIS — D631 Anemia in chronic kidney disease: Secondary | ICD-10-CM | POA: Diagnosis not present

## 2017-07-13 DIAGNOSIS — N2581 Secondary hyperparathyroidism of renal origin: Secondary | ICD-10-CM | POA: Diagnosis not present

## 2017-07-15 ENCOUNTER — Encounter (HOSPITAL_COMMUNITY): Payer: Self-pay | Admitting: Vascular Surgery

## 2017-07-15 DIAGNOSIS — D631 Anemia in chronic kidney disease: Secondary | ICD-10-CM | POA: Diagnosis not present

## 2017-07-15 DIAGNOSIS — N2581 Secondary hyperparathyroidism of renal origin: Secondary | ICD-10-CM | POA: Diagnosis not present

## 2017-07-15 DIAGNOSIS — N186 End stage renal disease: Secondary | ICD-10-CM | POA: Diagnosis not present

## 2017-07-18 DIAGNOSIS — N186 End stage renal disease: Secondary | ICD-10-CM | POA: Diagnosis not present

## 2017-07-18 DIAGNOSIS — N2581 Secondary hyperparathyroidism of renal origin: Secondary | ICD-10-CM | POA: Diagnosis not present

## 2017-07-18 DIAGNOSIS — D631 Anemia in chronic kidney disease: Secondary | ICD-10-CM | POA: Diagnosis not present

## 2017-07-20 DIAGNOSIS — N186 End stage renal disease: Secondary | ICD-10-CM | POA: Diagnosis not present

## 2017-07-20 DIAGNOSIS — D631 Anemia in chronic kidney disease: Secondary | ICD-10-CM | POA: Diagnosis not present

## 2017-07-20 DIAGNOSIS — N2581 Secondary hyperparathyroidism of renal origin: Secondary | ICD-10-CM | POA: Diagnosis not present

## 2017-07-22 DIAGNOSIS — N2581 Secondary hyperparathyroidism of renal origin: Secondary | ICD-10-CM | POA: Diagnosis not present

## 2017-07-22 DIAGNOSIS — I120 Hypertensive chronic kidney disease with stage 5 chronic kidney disease or end stage renal disease: Secondary | ICD-10-CM | POA: Diagnosis not present

## 2017-07-22 DIAGNOSIS — Z992 Dependence on renal dialysis: Secondary | ICD-10-CM | POA: Diagnosis not present

## 2017-07-22 DIAGNOSIS — D631 Anemia in chronic kidney disease: Secondary | ICD-10-CM | POA: Diagnosis not present

## 2017-07-22 DIAGNOSIS — N186 End stage renal disease: Secondary | ICD-10-CM | POA: Diagnosis not present

## 2017-07-25 DIAGNOSIS — D631 Anemia in chronic kidney disease: Secondary | ICD-10-CM | POA: Diagnosis not present

## 2017-07-25 DIAGNOSIS — D509 Iron deficiency anemia, unspecified: Secondary | ICD-10-CM | POA: Diagnosis not present

## 2017-07-25 DIAGNOSIS — N186 End stage renal disease: Secondary | ICD-10-CM | POA: Diagnosis not present

## 2017-07-25 DIAGNOSIS — N2581 Secondary hyperparathyroidism of renal origin: Secondary | ICD-10-CM | POA: Diagnosis not present

## 2017-07-27 DIAGNOSIS — D631 Anemia in chronic kidney disease: Secondary | ICD-10-CM | POA: Diagnosis not present

## 2017-07-27 DIAGNOSIS — N2581 Secondary hyperparathyroidism of renal origin: Secondary | ICD-10-CM | POA: Diagnosis not present

## 2017-07-27 DIAGNOSIS — N186 End stage renal disease: Secondary | ICD-10-CM | POA: Diagnosis not present

## 2017-07-27 DIAGNOSIS — D509 Iron deficiency anemia, unspecified: Secondary | ICD-10-CM | POA: Diagnosis not present

## 2017-07-30 DIAGNOSIS — N186 End stage renal disease: Secondary | ICD-10-CM | POA: Diagnosis not present

## 2017-07-30 DIAGNOSIS — D631 Anemia in chronic kidney disease: Secondary | ICD-10-CM | POA: Diagnosis not present

## 2017-07-30 DIAGNOSIS — D509 Iron deficiency anemia, unspecified: Secondary | ICD-10-CM | POA: Diagnosis not present

## 2017-07-30 DIAGNOSIS — N2581 Secondary hyperparathyroidism of renal origin: Secondary | ICD-10-CM | POA: Diagnosis not present

## 2017-08-01 DIAGNOSIS — N186 End stage renal disease: Secondary | ICD-10-CM | POA: Diagnosis not present

## 2017-08-01 DIAGNOSIS — D631 Anemia in chronic kidney disease: Secondary | ICD-10-CM | POA: Diagnosis not present

## 2017-08-01 DIAGNOSIS — D509 Iron deficiency anemia, unspecified: Secondary | ICD-10-CM | POA: Diagnosis not present

## 2017-08-01 DIAGNOSIS — N2581 Secondary hyperparathyroidism of renal origin: Secondary | ICD-10-CM | POA: Diagnosis not present

## 2017-08-03 DIAGNOSIS — N2581 Secondary hyperparathyroidism of renal origin: Secondary | ICD-10-CM | POA: Diagnosis not present

## 2017-08-03 DIAGNOSIS — D631 Anemia in chronic kidney disease: Secondary | ICD-10-CM | POA: Diagnosis not present

## 2017-08-03 DIAGNOSIS — D509 Iron deficiency anemia, unspecified: Secondary | ICD-10-CM | POA: Diagnosis not present

## 2017-08-03 DIAGNOSIS — N186 End stage renal disease: Secondary | ICD-10-CM | POA: Diagnosis not present

## 2017-08-06 DIAGNOSIS — N186 End stage renal disease: Secondary | ICD-10-CM | POA: Diagnosis not present

## 2017-08-06 DIAGNOSIS — N2581 Secondary hyperparathyroidism of renal origin: Secondary | ICD-10-CM | POA: Diagnosis not present

## 2017-08-06 DIAGNOSIS — D509 Iron deficiency anemia, unspecified: Secondary | ICD-10-CM | POA: Diagnosis not present

## 2017-08-06 DIAGNOSIS — D631 Anemia in chronic kidney disease: Secondary | ICD-10-CM | POA: Diagnosis not present

## 2017-08-08 DIAGNOSIS — N2581 Secondary hyperparathyroidism of renal origin: Secondary | ICD-10-CM | POA: Diagnosis not present

## 2017-08-08 DIAGNOSIS — D631 Anemia in chronic kidney disease: Secondary | ICD-10-CM | POA: Diagnosis not present

## 2017-08-08 DIAGNOSIS — D509 Iron deficiency anemia, unspecified: Secondary | ICD-10-CM | POA: Diagnosis not present

## 2017-08-08 DIAGNOSIS — N186 End stage renal disease: Secondary | ICD-10-CM | POA: Diagnosis not present

## 2017-08-10 DIAGNOSIS — D509 Iron deficiency anemia, unspecified: Secondary | ICD-10-CM | POA: Diagnosis not present

## 2017-08-10 DIAGNOSIS — N2581 Secondary hyperparathyroidism of renal origin: Secondary | ICD-10-CM | POA: Diagnosis not present

## 2017-08-10 DIAGNOSIS — N186 End stage renal disease: Secondary | ICD-10-CM | POA: Diagnosis not present

## 2017-08-10 DIAGNOSIS — D631 Anemia in chronic kidney disease: Secondary | ICD-10-CM | POA: Diagnosis not present

## 2017-08-13 DIAGNOSIS — D509 Iron deficiency anemia, unspecified: Secondary | ICD-10-CM | POA: Diagnosis not present

## 2017-08-13 DIAGNOSIS — D631 Anemia in chronic kidney disease: Secondary | ICD-10-CM | POA: Diagnosis not present

## 2017-08-13 DIAGNOSIS — N186 End stage renal disease: Secondary | ICD-10-CM | POA: Diagnosis not present

## 2017-08-13 DIAGNOSIS — N2581 Secondary hyperparathyroidism of renal origin: Secondary | ICD-10-CM | POA: Diagnosis not present

## 2017-08-14 DIAGNOSIS — N184 Chronic kidney disease, stage 4 (severe): Secondary | ICD-10-CM | POA: Diagnosis not present

## 2017-08-14 DIAGNOSIS — I10 Essential (primary) hypertension: Secondary | ICD-10-CM | POA: Diagnosis not present

## 2017-08-15 DIAGNOSIS — N186 End stage renal disease: Secondary | ICD-10-CM | POA: Diagnosis not present

## 2017-08-15 DIAGNOSIS — N2581 Secondary hyperparathyroidism of renal origin: Secondary | ICD-10-CM | POA: Diagnosis not present

## 2017-08-15 DIAGNOSIS — D509 Iron deficiency anemia, unspecified: Secondary | ICD-10-CM | POA: Diagnosis not present

## 2017-08-15 DIAGNOSIS — D631 Anemia in chronic kidney disease: Secondary | ICD-10-CM | POA: Diagnosis not present

## 2017-08-17 DIAGNOSIS — N2581 Secondary hyperparathyroidism of renal origin: Secondary | ICD-10-CM | POA: Diagnosis not present

## 2017-08-17 DIAGNOSIS — N186 End stage renal disease: Secondary | ICD-10-CM | POA: Diagnosis not present

## 2017-08-17 DIAGNOSIS — D509 Iron deficiency anemia, unspecified: Secondary | ICD-10-CM | POA: Diagnosis not present

## 2017-08-17 DIAGNOSIS — D631 Anemia in chronic kidney disease: Secondary | ICD-10-CM | POA: Diagnosis not present

## 2017-08-20 DIAGNOSIS — N2581 Secondary hyperparathyroidism of renal origin: Secondary | ICD-10-CM | POA: Diagnosis not present

## 2017-08-20 DIAGNOSIS — D509 Iron deficiency anemia, unspecified: Secondary | ICD-10-CM | POA: Diagnosis not present

## 2017-08-20 DIAGNOSIS — N186 End stage renal disease: Secondary | ICD-10-CM | POA: Diagnosis not present

## 2017-08-20 DIAGNOSIS — D631 Anemia in chronic kidney disease: Secondary | ICD-10-CM | POA: Diagnosis not present

## 2017-08-22 DIAGNOSIS — D631 Anemia in chronic kidney disease: Secondary | ICD-10-CM | POA: Diagnosis not present

## 2017-08-22 DIAGNOSIS — N2581 Secondary hyperparathyroidism of renal origin: Secondary | ICD-10-CM | POA: Diagnosis not present

## 2017-08-22 DIAGNOSIS — D509 Iron deficiency anemia, unspecified: Secondary | ICD-10-CM | POA: Diagnosis not present

## 2017-08-22 DIAGNOSIS — I120 Hypertensive chronic kidney disease with stage 5 chronic kidney disease or end stage renal disease: Secondary | ICD-10-CM | POA: Diagnosis not present

## 2017-08-22 DIAGNOSIS — N186 End stage renal disease: Secondary | ICD-10-CM | POA: Diagnosis not present

## 2017-08-22 DIAGNOSIS — Z992 Dependence on renal dialysis: Secondary | ICD-10-CM | POA: Diagnosis not present

## 2017-08-23 DIAGNOSIS — N186 End stage renal disease: Secondary | ICD-10-CM | POA: Diagnosis not present

## 2017-08-23 DIAGNOSIS — I871 Compression of vein: Secondary | ICD-10-CM | POA: Diagnosis not present

## 2017-08-23 DIAGNOSIS — T82868A Thrombosis of vascular prosthetic devices, implants and grafts, initial encounter: Secondary | ICD-10-CM | POA: Diagnosis not present

## 2017-08-23 DIAGNOSIS — Z992 Dependence on renal dialysis: Secondary | ICD-10-CM | POA: Diagnosis not present

## 2017-08-23 DIAGNOSIS — I120 Hypertensive chronic kidney disease with stage 5 chronic kidney disease or end stage renal disease: Secondary | ICD-10-CM | POA: Diagnosis not present

## 2017-08-24 DIAGNOSIS — D509 Iron deficiency anemia, unspecified: Secondary | ICD-10-CM | POA: Diagnosis not present

## 2017-08-24 DIAGNOSIS — N186 End stage renal disease: Secondary | ICD-10-CM | POA: Diagnosis not present

## 2017-08-24 DIAGNOSIS — N2581 Secondary hyperparathyroidism of renal origin: Secondary | ICD-10-CM | POA: Diagnosis not present

## 2017-08-24 DIAGNOSIS — D631 Anemia in chronic kidney disease: Secondary | ICD-10-CM | POA: Diagnosis not present

## 2017-08-27 DIAGNOSIS — N2581 Secondary hyperparathyroidism of renal origin: Secondary | ICD-10-CM | POA: Diagnosis not present

## 2017-08-27 DIAGNOSIS — D631 Anemia in chronic kidney disease: Secondary | ICD-10-CM | POA: Diagnosis not present

## 2017-08-27 DIAGNOSIS — N186 End stage renal disease: Secondary | ICD-10-CM | POA: Diagnosis not present

## 2017-08-27 DIAGNOSIS — D509 Iron deficiency anemia, unspecified: Secondary | ICD-10-CM | POA: Diagnosis not present

## 2017-08-27 DIAGNOSIS — Z992 Dependence on renal dialysis: Secondary | ICD-10-CM | POA: Diagnosis not present

## 2017-08-27 DIAGNOSIS — I871 Compression of vein: Secondary | ICD-10-CM | POA: Diagnosis not present

## 2017-08-27 DIAGNOSIS — T82858A Stenosis of vascular prosthetic devices, implants and grafts, initial encounter: Secondary | ICD-10-CM | POA: Diagnosis not present

## 2017-08-28 ENCOUNTER — Encounter: Payer: Self-pay | Admitting: Vascular Surgery

## 2017-08-28 ENCOUNTER — Encounter: Payer: Self-pay | Admitting: *Deleted

## 2017-08-28 ENCOUNTER — Ambulatory Visit (INDEPENDENT_AMBULATORY_CARE_PROVIDER_SITE_OTHER): Payer: Medicare Other | Admitting: Vascular Surgery

## 2017-08-28 ENCOUNTER — Other Ambulatory Visit: Payer: Self-pay | Admitting: *Deleted

## 2017-08-28 VITALS — BP 135/81 | HR 77 | Temp 98.1°F | Resp 20 | Ht 70.0 in | Wt 199.0 lb

## 2017-08-28 DIAGNOSIS — N186 End stage renal disease: Secondary | ICD-10-CM

## 2017-08-28 DIAGNOSIS — Z992 Dependence on renal dialysis: Secondary | ICD-10-CM

## 2017-08-28 NOTE — H&P (View-Only) (Signed)
Patient name: Andrew Brandt MRN: [id number] DOB: 05/10/1951 Sex: male  REASON FOR VISIT:   To evaluate for surgical revision of his left arm graft.  The consult is requested by Dr. Otelia Santee.  HPI:   Andrew Brandt is a pleasant 67 y.o. male who had a left upper arm AV graft placed by Dr. [name] on 07/12/2017.  This was a 4-7 mm PTFE graft which was anastomosed end to side to the axillary vein.  The patient had a declot at [facility] [place] [facility] on 08/23/2017.  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  A covered stent had to be placed.  I discussed the case with Dr. Otelia Santee on the phone today.  The patient had a subsequent follow-up fistulogram yesterday and there was some concerns about the inflow.  The arterial anastomosis was ballooned and after that there was reportedly excellent flow in the graft and a good thrill.  However the graft then occluded.   He comes here to discuss revision.  He has a functioning right IJ tunneled dialysis catheter.  He denies any recent uremic symptoms.  Specifically he denies nausea, vomiting, fatigue, anorexia, or palpitations.  Past Medical History:  Diagnosis Date  . Cancer [facility])    prostate cancer  . Chronic kidney disease    Dialysis Tues-Thurs.[facility] [address]  . Constipation   . Hypertension     Family History  Problem Relation Age of Onset  . Cancer Mother   . Diabetes Unknown        grandparents    SOCIAL HISTORY: Social History   Tobacco Use  . Smoking status: Former Research scientist (life sciences)  . Smokeless tobacco: Never Used  Substance Use Topics  . Alcohol use: Yes    Comment: Occ. beer    No Known Allergies  Current Outpatient Medications  Medication Sig Dispense Refill  . acetaminophen (TYLENOL) 500 MG tablet Take 1,000 mg by mouth every 6 (six) hours as needed for mild pain or headache.     . AMITIZA 24 MCG capsule Take 24 mcg by mouth 2 (two) times daily  with a meal.     . b complex-vitamin c-folic acid (NEPHRO-VITE) 0.8 MG TABS tablet Take 1 tablet by mouth daily.    Marland Kitchen ibuprofen (ADVIL,MOTRIN) 200 MG tablet Take 200 mg by mouth every 6 (six) hours as needed for headache or moderate pain.    Marland Kitchen oxyCODONE-acetaminophen (ROXICET) 5-325 MG tablet Take 1 tablet by mouth every 6 (six) hours as needed for severe pain. 8 tablet 0  . calcitRIOL (ROCALTROL) 0.5 MCG capsule Take 1 capsule (0.5 mcg total) by mouth daily. (Patient not taking: Reported on 07/05/2017) 30 capsule 0   No current facility-administered medications for this visit.     REVIEW OF SYSTEMS:  [X]  denotes positive finding, [ ]  denotes negative finding Cardiac  Comments:  Chest pain or chest pressure:    Shortness of breath upon exertion:    Short of breath when lying flat:    Irregular heart rhythm:        Vascular    Pain in calf, thigh, or hip brought on by ambulation:    Pain in feet at night that wakes you up from your sleep:     Blood clot in your veins:    Leg swelling:         Pulmonary    Oxygen at home:  Productive cough:     Wheezing:         Neurologic    Sudden weakness in arms or legs:     Sudden numbness in arms or legs:     Sudden onset of difficulty speaking or slurred speech:    Temporary loss of vision in one eye:     Problems with dizziness:         Gastrointestinal    Blood in stool:     Vomited blood:         Genitourinary    Burning when urinating:     Blood in urine:        Psychiatric    Major depression:         Hematologic    Bleeding problems:    Problems with blood clotting too easily:        Skin    Rashes or ulcers:        Constitutional    Fever or chills:     PHYSICAL EXAM:   Vitals:   08/28/17 1049  BP: 135/81  Pulse: 77  Resp: 20  Temp: 98.1 F (36.7 C)  TempSrc: Oral  SpO2: 97%  Weight: 199 lb (90.3 kg)  Height: 5\' 10"  (1.778 m)    GENERAL: The patient is a well-nourished male, in no acute distress.  The vital signs are documented above. CARDIAC: There is a regular rate and rhythm.  VASCULAR: He has a occluded left upper arm graft.  He has a palpable left radial pulse. PULMONARY: There is good air exchange bilaterally without wheezing or rales. ABDOMEN: Soft and non-tender with normal pitched bowel sounds.  MUSCULOSKELETAL: There are no major deformities or cyanosis. NEUROLOGIC: No focal weakness or paresthesias are detected. SKIN: There are no ulcers or rashes noted. PSYCHIATRIC: The patient has a normal affect.  DATA:    I have reviewed the venogram that was sent from [facility].  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  MEDICAL ISSUES:   OCCLUDED LEFT UPPER ARM AV GRAFT: This patient has an occluded left upper arm AV graft.  I am concerned that the graft is occluded fairly quickly after it was placed in December 2018.  The venous anastomotic stenosis required placement of a covered stent on 08/23/2017.  Yesterday he had angioplasty of the venous anastomosis and the graft occluded after this.  I have discussed the case with Dr. Otelia Santee on the phone and he feels that we likely could get above the covered stent into the axillary vein although I think this might be challenging.  In addition there was some concerns with the inflow.  He is recommended that we explore the inflow as he thinks this is likely where the issue is.  For this reason I think it would be reasonable to attempt a thrombectomy and revision of his left arm graft although I am concerned that this may not be successful given the multiple issues involved and the fact that the graft is only been in since December 2018.  I have explained that if I am not successful in getting the graft to work that we could proceed with placement of the fistula in the right arm at the same time to try to save him a trip back to the operating room.  I have discussed the  procedure and potential complications with the patient and he is agreeable to proceed.  We will  schedule this on a nondialysis day.  He is scheduled for 09/02/2017.  [name] [facility] [phone]

## 2017-08-28 NOTE — Progress Notes (Addendum)
Patient name: Andrew Brandt MRN: [id number] DOB: 1950-09-25 Sex: male  REASON FOR VISIT:   To evaluate for surgical revision of his left arm graft.  The consult is requested by Dr. Otelia Santee.  HPI:   Andrew Brandt is a pleasant 67 y.o. male who had a left upper arm AV graft placed by Dr. [name] on 07/12/2017.  This was a 4-7 mm PTFE graft which was anastomosed end to side to the axillary vein.  The patient had a declot at [place] [facility] on 08/23/2017.  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  A covered stent had to be placed.  I discussed the case with Dr. Otelia Santee on the phone today.  The patient had a subsequent follow-up fistulogram yesterday and there was some concerns about the inflow.  The arterial anastomosis was ballooned and after that there was reportedly excellent flow in the graft and a good thrill.  However the graft then occluded.   He comes here to discuss revision.  He has a functioning right IJ tunneled dialysis catheter.  He denies any recent uremic symptoms.  Specifically he denies nausea, vomiting, fatigue, anorexia, or palpitations.  Past Medical History:  Diagnosis Date  . Cancer [facility])    prostate cancer  . Chronic kidney disease    Dialysis Tues-Thurs.[facility] [address]  . Constipation   . Hypertension     Family History  Problem Relation Age of Onset  . Cancer Mother   . Diabetes Unknown        grandparents    SOCIAL HISTORY: Social History   Tobacco Use  . Smoking status: Former Research scientist (life sciences)  . Smokeless tobacco: Never Used  Substance Use Topics  . Alcohol use: Yes    Comment: Occ. beer    No Known Allergies  Current Outpatient Medications  Medication Sig Dispense Refill  . acetaminophen (TYLENOL) 500 MG tablet Take 1,000 mg by mouth every 6 (six) hours as needed for mild pain or headache.     . AMITIZA 24 MCG capsule Take 24 mcg by mouth 2 (two) times daily  with a meal.     . b complex-vitamin c-folic acid (NEPHRO-VITE) 0.8 MG TABS tablet Take 1 tablet by mouth daily.    Marland Kitchen ibuprofen (ADVIL,MOTRIN) 200 MG tablet Take 200 mg by mouth every 6 (six) hours as needed for headache or moderate pain.    Marland Kitchen oxyCODONE-acetaminophen (ROXICET) 5-325 MG tablet Take 1 tablet by mouth every 6 (six) hours as needed for severe pain. 8 tablet 0  . calcitRIOL (ROCALTROL) 0.5 MCG capsule Take 1 capsule (0.5 mcg total) by mouth daily. (Patient not taking: Reported on 07/05/2017) 30 capsule 0   No current facility-administered medications for this visit.     REVIEW OF SYSTEMS:  [X]  denotes positive finding, [ ]  denotes negative finding Cardiac  Comments:  Chest pain or chest pressure:    Shortness of breath upon exertion:    Short of breath when lying flat:    Irregular heart rhythm:        Vascular    Pain in calf, thigh, or hip brought on by ambulation:    Pain in feet at night that wakes you up from your sleep:     Blood clot in your veins:    Leg swelling:         Pulmonary    Oxygen at home:  Productive cough:     Wheezing:         Neurologic    Sudden weakness in arms or legs:     Sudden numbness in arms or legs:     Sudden onset of difficulty speaking or slurred speech:    Temporary loss of vision in one eye:     Problems with dizziness:         Gastrointestinal    Blood in stool:     Vomited blood:         Genitourinary    Burning when urinating:     Blood in urine:        Psychiatric    Major depression:         Hematologic    Bleeding problems:    Problems with blood clotting too easily:        Skin    Rashes or ulcers:        Constitutional    Fever or chills:     PHYSICAL EXAM:   Vitals:   08/28/17 1049  BP: 135/81  Pulse: 77  Resp: 20  Temp: 98.1 F (36.7 C)  TempSrc: [place]  SpO2: 97%  Weight: 199 lb (90.3 kg)  Height: 5\' 10"  (1.778 m)    GENERAL: The patient is a well-nourished male, in no acute distress.  The vital signs are documented above. CARDIAC: There is a regular rate and rhythm.  VASCULAR: He has a occluded left upper arm graft.  He has a palpable left radial pulse. PULMONARY: There is good air exchange bilaterally without wheezing or rales. ABDOMEN: Soft and non-tender with normal pitched bowel sounds.  MUSCULOSKELETAL: There are no major deformities or cyanosis. NEUROLOGIC: No focal weakness or paresthesias are detected. SKIN: There are no ulcers or rashes noted. PSYCHIATRIC: The patient has a normal affect.  DATA:    I have reviewed the venogram that was sent from [facility].  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  MEDICAL ISSUES:   OCCLUDED LEFT UPPER ARM AV GRAFT: This patient has an occluded left upper arm AV graft.  I am concerned that the graft is occluded fairly quickly after it was placed in December 2018.  The venous anastomotic stenosis required placement of a covered stent on 08/23/2017.  Yesterday he had angioplasty of the venous anastomosis and the graft occluded after this.  I have discussed the case with Dr. Otelia Santee on the phone and he feels that we likely could get above the covered stent into the axillary vein although I think this might be challenging.  In addition there was some concerns with the inflow.  He is recommended that we explore the inflow as he thinks this is likely where the issue is.  For this reason I think it would be reasonable to attempt a thrombectomy and revision of his left arm graft although I am concerned that this may not be successful given the multiple issues involved and the fact that the graft is only been in since December 2018.  I have explained that if I am not successful in getting the graft to work that we could proceed with placement of the fistula in the right arm at the same time to try to save him a trip back to the operating room.  I have discussed the  procedure and potential complications with the patient and he is agreeable to proceed.  We will  schedule this on a nondialysis day.  He is scheduled for 09/02/2017.  [name] [facility] of [facility] [phone]

## 2017-08-29 DIAGNOSIS — N2581 Secondary hyperparathyroidism of renal origin: Secondary | ICD-10-CM | POA: Diagnosis not present

## 2017-08-29 DIAGNOSIS — D509 Iron deficiency anemia, unspecified: Secondary | ICD-10-CM | POA: Diagnosis not present

## 2017-08-29 DIAGNOSIS — N186 End stage renal disease: Secondary | ICD-10-CM | POA: Diagnosis not present

## 2017-08-29 DIAGNOSIS — D631 Anemia in chronic kidney disease: Secondary | ICD-10-CM | POA: Diagnosis not present

## 2017-08-30 ENCOUNTER — Other Ambulatory Visit: Payer: Self-pay

## 2017-08-30 ENCOUNTER — Encounter (HOSPITAL_COMMUNITY): Payer: Self-pay | Admitting: *Deleted

## 2017-08-30 NOTE — Progress Notes (Signed)
Pt denies SOB, chest pain, and being under the care of a cardiologist. Pt denies having a cardiac cath. Pt made aware to stop taking vitamins, fish oil and herbal medications. Do not take any NSAIDs ie: Ibuprofen, Advil, Naproxen (Aleve), Motrin, BC and Goody Powder. Pt verbalized understanding of all pre-op instructions.

## 2017-08-31 DIAGNOSIS — N2581 Secondary hyperparathyroidism of renal origin: Secondary | ICD-10-CM | POA: Diagnosis not present

## 2017-08-31 DIAGNOSIS — N186 End stage renal disease: Secondary | ICD-10-CM | POA: Diagnosis not present

## 2017-08-31 DIAGNOSIS — D509 Iron deficiency anemia, unspecified: Secondary | ICD-10-CM | POA: Diagnosis not present

## 2017-08-31 DIAGNOSIS — D631 Anemia in chronic kidney disease: Secondary | ICD-10-CM | POA: Diagnosis not present

## 2017-09-02 ENCOUNTER — Ambulatory Visit (HOSPITAL_COMMUNITY): Payer: Medicare Other | Admitting: Anesthesiology

## 2017-09-02 ENCOUNTER — Ambulatory Visit (HOSPITAL_COMMUNITY)
Admission: RE | Admit: 2017-09-02 | Discharge: 2017-09-02 | Disposition: A | Discharge status: Home / Self Care | Payer: Medicare Other | Source: Ambulatory Visit | Attending: Vascular Surgery | Admitting: Vascular Surgery

## 2017-09-02 ENCOUNTER — Encounter (HOSPITAL_COMMUNITY): Payer: Self-pay

## 2017-09-02 ENCOUNTER — Other Ambulatory Visit: Payer: Self-pay

## 2017-09-02 ENCOUNTER — Encounter (HOSPITAL_COMMUNITY)
Admission: RE | Disposition: A | Discharge status: Home / Self Care | Payer: Self-pay | Source: Ambulatory Visit | Attending: Vascular Surgery

## 2017-09-02 DIAGNOSIS — T82868A Thrombosis of vascular prosthetic devices, implants and grafts, initial encounter: Secondary | ICD-10-CM | POA: Diagnosis not present

## 2017-09-02 DIAGNOSIS — N186 End stage renal disease: Secondary | ICD-10-CM | POA: Diagnosis not present

## 2017-09-02 DIAGNOSIS — Z87891 Personal history of nicotine dependence: Secondary | ICD-10-CM | POA: Insufficient documentation

## 2017-09-02 DIAGNOSIS — Z79899 Other long term (current) drug therapy: Secondary | ICD-10-CM | POA: Insufficient documentation

## 2017-09-02 DIAGNOSIS — Z8546 Personal history of malignant neoplasm of prostate: Secondary | ICD-10-CM | POA: Diagnosis not present

## 2017-09-02 DIAGNOSIS — Y832 Surgical operation with anastomosis, bypass or graft as the cause of abnormal reaction of the patient, or of later complication, without mention of misadventure at the time of the procedure: Secondary | ICD-10-CM | POA: Insufficient documentation

## 2017-09-02 DIAGNOSIS — Z992 Dependence on renal dialysis: Secondary | ICD-10-CM | POA: Diagnosis not present

## 2017-09-02 DIAGNOSIS — D696 Thrombocytopenia, unspecified: Secondary | ICD-10-CM | POA: Diagnosis not present

## 2017-09-02 DIAGNOSIS — I120 Hypertensive chronic kidney disease with stage 5 chronic kidney disease or end stage renal disease: Secondary | ICD-10-CM | POA: Insufficient documentation

## 2017-09-02 HISTORY — DX: Headache, unspecified: R51.9

## 2017-09-02 HISTORY — PX: THROMBECTOMY AND REVISION OF ARTERIOVENTOUS (AV) GORETEX  GRAFT: SHX6120

## 2017-09-02 HISTORY — DX: Headache: R51

## 2017-09-02 LAB — POCT I-STAT 4, (NA,K, GLUC, HGB,HCT)
Glucose, Bld: 74 mg/dL (ref 65–99)
HCT: 31 % — ABNORMAL LOW (ref 39.0–52.0)
Hemoglobin: 10.5 g/dL — ABNORMAL LOW (ref 13.0–17.0)
Potassium: 4.2 mmol/L (ref 3.5–5.1)
Sodium: 138 mmol/L (ref 135–145)

## 2017-09-02 SURGERY — THROMBECTOMY AND REVISION OF ARTERIOVENTOUS (AV) GORETEX  GRAFT
Anesthesia: General | Site: Arm Upper | Laterality: Right

## 2017-09-02 MED ORDER — FENTANYL CITRATE (PF) 250 MCG/5ML IJ SOLN
INTRAMUSCULAR | Status: AC
  Filled 2017-09-02: qty 5

## 2017-09-02 MED ORDER — PHENYLEPHRINE HCL 10 MG/ML IJ SOLN
INTRAVENOUS | Status: DC | PRN
  Administered 2017-09-02: 10 ug/min via INTRAVENOUS

## 2017-09-02 MED ORDER — MEPERIDINE HCL 50 MG/ML IJ SOLN: 6.2500 mg | INTRAMUSCULAR | Status: DC | PRN

## 2017-09-02 MED ORDER — SODIUM CHLORIDE 0.9 % IV SOLN
INTRAVENOUS | Status: DC
  Administered 2017-09-02 (×2): via INTRAVENOUS

## 2017-09-02 MED ORDER — ONDANSETRON HCL 4 MG/2ML IJ SOLN: 4.0000 mg | Freq: Once | INTRAMUSCULAR | Status: DC | PRN

## 2017-09-02 MED ORDER — MIDAZOLAM HCL 2 MG/2ML IJ SOLN
INTRAMUSCULAR | Status: AC
  Filled 2017-09-02: qty 2

## 2017-09-02 MED ORDER — PROTAMINE SULFATE 10 MG/ML IV SOLN
INTRAVENOUS | Status: AC
  Filled 2017-09-02: qty 25

## 2017-09-02 MED ORDER — 0.9 % SODIUM CHLORIDE (POUR BTL) OPTIME
TOPICAL | Status: DC | PRN
  Administered 2017-09-02: 1000 mL

## 2017-09-02 MED ORDER — HYDROMORPHONE HCL 1 MG/ML IJ SOLN: 0.2500 mg | INTRAMUSCULAR | Status: DC | PRN

## 2017-09-02 MED ORDER — PROPOFOL 10 MG/ML IV BOLUS
INTRAVENOUS | Status: AC
  Filled 2017-09-02: qty 20

## 2017-09-02 MED ORDER — HEPARIN SODIUM (PORCINE) 5000 UNIT/ML IJ SOLN
INTRAMUSCULAR | Status: DC | PRN
  Administered 2017-09-02: 08:00:00 500 mL

## 2017-09-02 MED ORDER — DEXTROSE 5 % IV SOLN
1.5000 g | INTRAVENOUS | Status: AC
  Administered 2017-09-02: 1.5 g via INTRAVENOUS
  Filled 2017-09-02: qty 1.5

## 2017-09-02 MED ORDER — FENTANYL CITRATE (PF) 250 MCG/5ML IJ SOLN
INTRAMUSCULAR | Status: DC | PRN
  Administered 2017-09-02 (×2): 50 ug via INTRAVENOUS
  Administered 2017-09-02: 25 ug via INTRAVENOUS

## 2017-09-02 MED ORDER — PROTAMINE SULFATE 10 MG/ML IV SOLN
INTRAVENOUS | Status: DC | PRN
  Administered 2017-09-02: 40 mg via INTRAVENOUS

## 2017-09-02 MED ORDER — OXYCODONE-ACETAMINOPHEN 5-325 MG PO TABS: 1.0000 | ORAL_TABLET | Freq: Four times a day (QID) | ORAL | 0 refills | Status: DC | PRN

## 2017-09-02 MED ORDER — LIDOCAINE-EPINEPHRINE (PF) 1 %-1:200000 IJ SOLN
INTRAMUSCULAR | Status: AC
  Filled 2017-09-02: qty 30

## 2017-09-02 MED ORDER — HEPARIN SODIUM (PORCINE) 1000 UNIT/ML IJ SOLN
INTRAMUSCULAR | Status: DC | PRN
  Administered 2017-09-02: 7000 [IU] via INTRAVENOUS

## 2017-09-02 MED ORDER — PROPOFOL 10 MG/ML IV BOLUS
INTRAVENOUS | Status: DC | PRN
  Administered 2017-09-02: 150 mg via INTRAVENOUS

## 2017-09-02 MED ORDER — ONDANSETRON HCL 4 MG/2ML IJ SOLN
INTRAMUSCULAR | Status: DC | PRN
  Administered 2017-09-02: 4 mg via INTRAVENOUS

## 2017-09-02 MED ORDER — LIDOCAINE HCL (CARDIAC) 20 MG/ML IV SOLN
INTRAVENOUS | Status: DC | PRN
Start: 2017-09-02 — End: 2017-09-02
  Administered 2017-09-02: 100 mg via INTRATRACHEAL

## 2017-09-02 MED ORDER — DEXAMETHASONE SODIUM PHOSPHATE 10 MG/ML IJ SOLN
INTRAMUSCULAR | Status: DC | PRN
  Administered 2017-09-02: 5 mg via INTRAVENOUS

## 2017-09-02 MED ORDER — MIDAZOLAM HCL 5 MG/5ML IJ SOLN
INTRAMUSCULAR | Status: DC | PRN
  Administered 2017-09-02: 2 mg via INTRAVENOUS

## 2017-09-02 MED ORDER — HYDROMORPHONE HCL 1 MG/ML IJ SOLN
INTRAMUSCULAR | Status: AC
  Administered 2017-09-02: 0.5 mg via INTRAVENOUS
  Filled 2017-09-02: qty 1

## 2017-09-02 MED ORDER — HEPARIN SODIUM (PORCINE) 1000 UNIT/ML IJ SOLN
INTRAMUSCULAR | Status: AC
  Filled 2017-09-02: qty 1

## 2017-09-02 MED ORDER — HYDROMORPHONE HCL 1 MG/ML IJ SOLN
0.2500 mg | INTRAMUSCULAR | Status: DC | PRN
  Administered 2017-09-02 (×2): 0.5 mg via INTRAVENOUS

## 2017-09-02 MED ORDER — LIDOCAINE HCL (PF) 1 % IJ SOLN
INTRAMUSCULAR | Status: AC
  Filled 2017-09-02: qty 30

## 2017-09-02 SURGICAL SUPPLY — 46 items
ARMBAND PINK RESTRICT EXTREMIT (MISCELLANEOUS) ×6 IMPLANT
CANISTER SUCT 3000ML PPV (MISCELLANEOUS) ×3 IMPLANT
CANNULA VESSEL 3MM 2 BLNT TIP (CANNULA) ×3 IMPLANT
CATH EMB 4FR 80CM (CATHETERS) ×3 IMPLANT
CLIP VESOCCLUDE MED 6/CT (CLIP) ×3 IMPLANT
CLIP VESOCCLUDE SM WIDE 6/CT (CLIP) ×6 IMPLANT
COVER PROBE W GEL 5X96 (DRAPES) ×3 IMPLANT
DECANTER SPIKE VIAL GLASS SM (MISCELLANEOUS) ×3 IMPLANT
DERMABOND ADVANCED (GAUZE/BANDAGES/DRESSINGS) ×1
DERMABOND ADVANCED .7 DNX12 (GAUZE/BANDAGES/DRESSINGS) ×2 IMPLANT
DRAPE X-RAY CASS 24X20 (DRAPES) IMPLANT
ELECT REM PT RETURN 9FT ADLT (ELECTROSURGICAL) ×3
ELECTRODE REM PT RTRN 9FT ADLT (ELECTROSURGICAL) ×2 IMPLANT
GAUZE SPONGE 4X4 16PLY XRAY LF (GAUZE/BANDAGES/DRESSINGS) IMPLANT
GLOVE BIO SURGEON STRL SZ 6.5 (GLOVE) ×3 IMPLANT
GLOVE BIO SURGEON STRL SZ7.5 (GLOVE) ×3 IMPLANT
GLOVE BIOGEL PI IND STRL 6.5 (GLOVE) ×4 IMPLANT
GLOVE BIOGEL PI IND STRL 7.5 (GLOVE) ×2 IMPLANT
GLOVE BIOGEL PI IND STRL 8 (GLOVE) ×4 IMPLANT
GLOVE BIOGEL PI INDICATOR 6.5 (GLOVE) ×2
GLOVE BIOGEL PI INDICATOR 7.5 (GLOVE) ×1
GLOVE BIOGEL PI INDICATOR 8 (GLOVE) ×2
GLOVE ECLIPSE 7.0 STRL STRAW (GLOVE) ×3 IMPLANT
GOWN STRL REUS W/ TWL LRG LVL3 (GOWN DISPOSABLE) ×6 IMPLANT
GOWN STRL REUS W/TWL LRG LVL3 (GOWN DISPOSABLE) ×3
KIT BASIN OR (CUSTOM PROCEDURE TRAY) ×3 IMPLANT
KIT ROOM TURNOVER OR (KITS) ×3 IMPLANT
LOOP VESSEL MAXI BLUE (MISCELLANEOUS) ×3 IMPLANT
LOOP VESSEL MINI RED (MISCELLANEOUS) ×3 IMPLANT
NS IRRIG 1000ML POUR BTL (IV SOLUTION) ×3 IMPLANT
PACK CV ACCESS (CUSTOM PROCEDURE TRAY) ×3 IMPLANT
PAD ARMBOARD 7.5X6 YLW CONV (MISCELLANEOUS) ×6 IMPLANT
SET COLLECT BLD 21X3/4 12 (NEEDLE) IMPLANT
SPONGE LAP 18X18 X RAY DECT (DISPOSABLE) ×3 IMPLANT
SPONGE SURGIFOAM ABS GEL 100 (HEMOSTASIS) IMPLANT
STOPCOCK 4 WAY LG BORE MALE ST (IV SETS) IMPLANT
SUCTION FRAZIER HANDLE 10FR (MISCELLANEOUS) ×1
SUCTION TUBE FRAZIER 10FR DISP (MISCELLANEOUS) ×2 IMPLANT
SUT PROLENE 6 0 BV (SUTURE) ×9 IMPLANT
SUT VIC AB 3-0 SH 27 (SUTURE) ×2
SUT VIC AB 3-0 SH 27X BRD (SUTURE) ×4 IMPLANT
SUT VICRYL 4-0 PS2 18IN ABS (SUTURE) ×6 IMPLANT
TOWEL GREEN STERILE (TOWEL DISPOSABLE) ×3 IMPLANT
TUBING EXTENTION W/L.L. (IV SETS) IMPLANT
UNDERPAD 30X30 (UNDERPADS AND DIAPERS) ×3 IMPLANT
WATER STERILE IRR 1000ML POUR (IV SOLUTION) ×3 IMPLANT

## 2017-09-02 NOTE — Op Note (Signed)
    NAME: Andrew Brandt    MRN: [id number] DOB: [birth year]    DATE OF OPERATION: 09/02/2017  PREOP DIAGNOSIS:    Clotted left upper arm AV graft  POSTOP DIAGNOSIS:    Same  PROCEDURE:    Thrombectomy of left upper arm AV graft  SURGEON: Judeth Cornfield. Scot Dock, MD, FACS  ASSIST: Gerri Lins PA  ANESTHESIA: General  EBL: Minimal  INDICATIONS:    [name] is a 67 y.o. male who had undergone a recent intervention by [facility].  Covered stent was placed across the venous anastomosis.  There were concerns about the arterial anastomosis.  Graft occluded following day and he presents for surgical thrombectomy.  FINDINGS:   I was able to successfully thrombectomized the graft with an excellent thrill at the completion.  TECHNIQUE:   The patient was taken to the operating room and received a general anesthetic.  The left arm was prepped and draped in usual sterile fashion.  Incision was made over the arterial anastomosis and the arterial limb of the graft was dissected free.  I also controlled the brachial artery proximal and distal to the anastomosis.  A separate longitudinal incision was made beneath the axilla.  I was concerned about thrombectomizing through the covered stent and planned on exposing the axillary artery above the stent.  I dissected out the venous anastomosis and the vein central to this however the stent extended quite far and I was unable to get beyond the stent for revision.  Therefore I felt the only option was to simply thrombectomized the graft from the arterial end.  The patient was heparinized.  The brachial artery was clamped proximally and distally and a transverse graftotomy was made just beyond the anastomosis.  Thrombectomy was performed of the arterial anastomosis under direct vision in the arterial anastomosis was widely patent.  I then performed graft thrombectomy using a #4 Fogarty catheter and was able to easily retrieve the clot and  established excellent back bleeding.  I passed the catheter until no further clot was retrieved and then this was flushed with heparinized saline.  The graftotomy was closed with running 6-0 Prolene suture.  At the completion the clamps were released and there was an excellent thrill in the graft.  There was a radial and ulnar signal with a Doppler.  The heparin was partially reversed with protamine.  The wounds were closed with a deep layer of 3-0 Vicryl and the skin closed with 4-0 Vicryl.  Dermabond was applied.  The patient tolerated the procedure well and was transferred to the recovery room in stable condition.  All needle and sponge counts were correct.  [name], MD, FACS [facility]  DATE OF DICTATION:   09/02/2017

## 2017-09-02 NOTE — Interval H&P Note (Signed)
History and Physical Interval Note:  09/02/2017 7:22 AM  Andrew Brandt  has presented today for surgery, with the diagnosis of end stage renal disease  The various methods of treatment have been discussed with the patient and family. After consideration of risks, benefits and other options for treatment, the patient has consented to  Procedure(s): THROMBECTOMY AND REVISION OF ARTERIOVENTOUS (AV) GORETEX  GRAFT (Left) ARTERIOVENOUS (AV) FISTULA CREATION (Right) as a surgical intervention .  The patient's history has been reviewed, patient examined, no change in status, stable for surgery.  I have reviewed the patient's chart and labs.  Questions were answered to the patient's satisfaction.     [name]

## 2017-09-02 NOTE — Anesthesia Postprocedure Evaluation (Signed)
Anesthesia Post Note  Patient: Andrew Brandt  Procedure(s) Performed: THROMBECTOMY  OF Left Arm  ARTERIOVENTOUS (AV) GORETEX  GRAFT (Left Arm Upper)     Patient location during evaluation: PACU Anesthesia Type: General Level of consciousness: awake and alert Pain management: pain level controlled Vital Signs Assessment: post-procedure vital signs reviewed and stable Respiratory status: spontaneous breathing, nonlabored ventilation, respiratory function stable and patient connected to nasal cannula oxygen Cardiovascular status: blood pressure returned to baseline and stable Postop Assessment: no apparent nausea or vomiting Anesthetic complications: no    Last Vitals:  Vitals:   09/02/17 1003 09/02/17 1028  BP: 137/78 (!) 154/79  Pulse: 76 71  Resp: 11 12  Temp:    SpO2: 94% 96%    Last Pain:  Vitals:   09/02/17 1028  TempSrc:   PainSc: 0-No pain                 Lino Wickliff DAVID

## 2017-09-02 NOTE — Anesthesia Preprocedure Evaluation (Signed)
Anesthesia Evaluation  Patient identified by MRN, date of birth, ID band Patient awake    Reviewed: Allergy & Precautions, NPO status , Patient's Chart, lab work & pertinent test results  Airway Mallampati: I  TM Distance: >3 FB Neck ROM: Full    Dental   Pulmonary former smoker,    Pulmonary exam normal        Cardiovascular hypertension, Pt. on medications Normal cardiovascular exam     Neuro/Psych    GI/Hepatic   Endo/Other    Renal/GU Dialysis and ESRFRenal disease     Musculoskeletal   Abdominal   Peds  Hematology   Anesthesia Other Findings   Reproductive/Obstetrics                             Anesthesia Physical Anesthesia Plan  ASA: III  Anesthesia Plan: General   Post-op Pain Management:    Induction: Intravenous  PONV Risk Score and Plan: 2 and Ondansetron and Dexamethasone  Airway Management Planned: LMA  Additional Equipment:   Intra-op Plan:   Post-operative Plan: Extubation in OR  Informed Consent: I have reviewed the patients History and Physical, chart, labs and discussed the procedure including the risks, benefits and alternatives for the proposed anesthesia with the patient or authorized representative who has indicated his/her understanding and acceptance.     Plan Discussed with: CRNA and Surgeon  Anesthesia Plan Comments:         Anesthesia Quick Evaluation

## 2017-09-02 NOTE — Transfer of Care (Signed)
Immediate Anesthesia Transfer of Care Note  Patient: Andrew Brandt  Procedure(s) Performed: THROMBECTOMY  OF Left Arm  ARTERIOVENTOUS (AV) GORETEX  GRAFT (Left Arm Upper)  Patient Location: PACU  Anesthesia Type:General  Level of Consciousness: awake, alert  and oriented  Airway & Oxygen Therapy: Patient Spontanous Breathing and Patient connected to nasal cannula oxygen  Post-op Assessment: Report given to RN, Post -op Vital signs reviewed and stable and Patient moving all extremities X 4  Post vital signs: Reviewed and stable  Last Vitals:  Vitals:   09/02/17 0551 09/02/17 0903  BP: (!) 155/81 (!) 155/86  Pulse: 72 82  Resp: 20 12  Temp: 36.9 C (!) 36.4 C  SpO2: 96% 100%    Last Pain:  Vitals:   09/02/17 0551  TempSrc: Oral      Patients Stated Pain Goal: 0 (00/[birth year] 4496)  Complications: No apparent anesthesia complications

## 2017-09-02 NOTE — Anesthesia Procedure Notes (Signed)
Procedure Name: LMA Insertion Date/Time: 09/02/2017 7:40 AM Performed by: Mariea Clonts, CRNA Pre-anesthesia Checklist: Patient identified, Emergency Drugs available, Suction available and Patient being monitored Patient Re-evaluated:Patient Re-evaluated prior to induction Oxygen Delivery Method: Circle System Utilized Preoxygenation: Pre-oxygenation with 100% oxygen Induction Type: IV induction Ventilation: Mask ventilation without difficulty LMA: LMA inserted LMA Size: 5.0 Number of attempts: 1 Airway Equipment and Method: Bite block Placement Confirmation: positive ETCO2 Tube secured with: Tape Dental Injury: Teeth and Oropharynx as per pre-operative assessment

## 2017-09-02 NOTE — Discharge Instructions (Signed)
They may stick graft for dialysis.  Don't stick around new incisions.  Follow up as needed in the future.

## 2017-09-03 ENCOUNTER — Encounter (HOSPITAL_COMMUNITY): Payer: Self-pay | Admitting: Vascular Surgery

## 2017-09-03 DIAGNOSIS — D509 Iron deficiency anemia, unspecified: Secondary | ICD-10-CM | POA: Diagnosis not present

## 2017-09-03 DIAGNOSIS — D631 Anemia in chronic kidney disease: Secondary | ICD-10-CM | POA: Diagnosis not present

## 2017-09-03 DIAGNOSIS — N186 End stage renal disease: Secondary | ICD-10-CM | POA: Diagnosis not present

## 2017-09-03 DIAGNOSIS — N2581 Secondary hyperparathyroidism of renal origin: Secondary | ICD-10-CM | POA: Diagnosis not present

## 2017-09-05 DIAGNOSIS — N2581 Secondary hyperparathyroidism of renal origin: Secondary | ICD-10-CM | POA: Diagnosis not present

## 2017-09-05 DIAGNOSIS — D509 Iron deficiency anemia, unspecified: Secondary | ICD-10-CM | POA: Diagnosis not present

## 2017-09-05 DIAGNOSIS — D631 Anemia in chronic kidney disease: Secondary | ICD-10-CM | POA: Diagnosis not present

## 2017-09-05 DIAGNOSIS — N186 End stage renal disease: Secondary | ICD-10-CM | POA: Diagnosis not present

## 2017-09-07 DIAGNOSIS — D631 Anemia in chronic kidney disease: Secondary | ICD-10-CM | POA: Diagnosis not present

## 2017-09-07 DIAGNOSIS — D509 Iron deficiency anemia, unspecified: Secondary | ICD-10-CM | POA: Diagnosis not present

## 2017-09-07 DIAGNOSIS — N2581 Secondary hyperparathyroidism of renal origin: Secondary | ICD-10-CM | POA: Diagnosis not present

## 2017-09-07 DIAGNOSIS — N186 End stage renal disease: Secondary | ICD-10-CM | POA: Diagnosis not present

## 2017-09-10 DIAGNOSIS — D631 Anemia in chronic kidney disease: Secondary | ICD-10-CM | POA: Diagnosis not present

## 2017-09-10 DIAGNOSIS — N2581 Secondary hyperparathyroidism of renal origin: Secondary | ICD-10-CM | POA: Diagnosis not present

## 2017-09-10 DIAGNOSIS — N186 End stage renal disease: Secondary | ICD-10-CM | POA: Diagnosis not present

## 2017-09-10 DIAGNOSIS — D509 Iron deficiency anemia, unspecified: Secondary | ICD-10-CM | POA: Diagnosis not present

## 2017-09-12 DIAGNOSIS — D631 Anemia in chronic kidney disease: Secondary | ICD-10-CM | POA: Diagnosis not present

## 2017-09-12 DIAGNOSIS — N2581 Secondary hyperparathyroidism of renal origin: Secondary | ICD-10-CM | POA: Diagnosis not present

## 2017-09-12 DIAGNOSIS — N186 End stage renal disease: Secondary | ICD-10-CM | POA: Diagnosis not present

## 2017-09-12 DIAGNOSIS — D509 Iron deficiency anemia, unspecified: Secondary | ICD-10-CM | POA: Diagnosis not present

## 2017-09-14 DIAGNOSIS — D509 Iron deficiency anemia, unspecified: Secondary | ICD-10-CM | POA: Diagnosis not present

## 2017-09-14 DIAGNOSIS — N2581 Secondary hyperparathyroidism of renal origin: Secondary | ICD-10-CM | POA: Diagnosis not present

## 2017-09-14 DIAGNOSIS — D631 Anemia in chronic kidney disease: Secondary | ICD-10-CM | POA: Diagnosis not present

## 2017-09-14 DIAGNOSIS — N186 End stage renal disease: Secondary | ICD-10-CM | POA: Diagnosis not present

## 2017-09-17 DIAGNOSIS — D631 Anemia in chronic kidney disease: Secondary | ICD-10-CM | POA: Diagnosis not present

## 2017-09-17 DIAGNOSIS — N2581 Secondary hyperparathyroidism of renal origin: Secondary | ICD-10-CM | POA: Diagnosis not present

## 2017-09-17 DIAGNOSIS — D509 Iron deficiency anemia, unspecified: Secondary | ICD-10-CM | POA: Diagnosis not present

## 2017-09-17 DIAGNOSIS — N186 End stage renal disease: Secondary | ICD-10-CM | POA: Diagnosis not present

## 2017-09-19 DIAGNOSIS — D631 Anemia in chronic kidney disease: Secondary | ICD-10-CM | POA: Diagnosis not present

## 2017-09-19 DIAGNOSIS — N2581 Secondary hyperparathyroidism of renal origin: Secondary | ICD-10-CM | POA: Diagnosis not present

## 2017-09-19 DIAGNOSIS — D509 Iron deficiency anemia, unspecified: Secondary | ICD-10-CM | POA: Diagnosis not present

## 2017-09-19 DIAGNOSIS — N186 End stage renal disease: Secondary | ICD-10-CM | POA: Diagnosis not present

## 2017-09-20 DIAGNOSIS — I120 Hypertensive chronic kidney disease with stage 5 chronic kidney disease or end stage renal disease: Secondary | ICD-10-CM | POA: Diagnosis not present

## 2017-09-20 DIAGNOSIS — N186 End stage renal disease: Secondary | ICD-10-CM | POA: Diagnosis not present

## 2017-09-20 DIAGNOSIS — Z992 Dependence on renal dialysis: Secondary | ICD-10-CM | POA: Diagnosis not present

## 2017-09-21 DIAGNOSIS — N2581 Secondary hyperparathyroidism of renal origin: Secondary | ICD-10-CM | POA: Diagnosis not present

## 2017-09-21 DIAGNOSIS — D631 Anemia in chronic kidney disease: Secondary | ICD-10-CM | POA: Diagnosis not present

## 2017-09-21 DIAGNOSIS — N186 End stage renal disease: Secondary | ICD-10-CM | POA: Diagnosis not present

## 2017-09-21 DIAGNOSIS — D509 Iron deficiency anemia, unspecified: Secondary | ICD-10-CM | POA: Diagnosis not present

## 2017-09-24 DIAGNOSIS — D631 Anemia in chronic kidney disease: Secondary | ICD-10-CM | POA: Diagnosis not present

## 2017-09-24 DIAGNOSIS — D509 Iron deficiency anemia, unspecified: Secondary | ICD-10-CM | POA: Diagnosis not present

## 2017-09-24 DIAGNOSIS — N186 End stage renal disease: Secondary | ICD-10-CM | POA: Diagnosis not present

## 2017-09-24 DIAGNOSIS — N2581 Secondary hyperparathyroidism of renal origin: Secondary | ICD-10-CM | POA: Diagnosis not present

## 2017-09-26 DIAGNOSIS — D631 Anemia in chronic kidney disease: Secondary | ICD-10-CM | POA: Diagnosis not present

## 2017-09-26 DIAGNOSIS — N186 End stage renal disease: Secondary | ICD-10-CM | POA: Diagnosis not present

## 2017-09-26 DIAGNOSIS — N2581 Secondary hyperparathyroidism of renal origin: Secondary | ICD-10-CM | POA: Diagnosis not present

## 2017-09-26 DIAGNOSIS — D509 Iron deficiency anemia, unspecified: Secondary | ICD-10-CM | POA: Diagnosis not present

## 2017-09-28 DIAGNOSIS — D509 Iron deficiency anemia, unspecified: Secondary | ICD-10-CM | POA: Diagnosis not present

## 2017-09-28 DIAGNOSIS — D631 Anemia in chronic kidney disease: Secondary | ICD-10-CM | POA: Diagnosis not present

## 2017-09-28 DIAGNOSIS — N2581 Secondary hyperparathyroidism of renal origin: Secondary | ICD-10-CM | POA: Diagnosis not present

## 2017-09-28 DIAGNOSIS — N186 End stage renal disease: Secondary | ICD-10-CM | POA: Diagnosis not present

## 2017-10-01 DIAGNOSIS — N186 End stage renal disease: Secondary | ICD-10-CM | POA: Diagnosis not present

## 2017-10-01 DIAGNOSIS — N2581 Secondary hyperparathyroidism of renal origin: Secondary | ICD-10-CM | POA: Diagnosis not present

## 2017-10-01 DIAGNOSIS — D631 Anemia in chronic kidney disease: Secondary | ICD-10-CM | POA: Diagnosis not present

## 2017-10-01 DIAGNOSIS — D509 Iron deficiency anemia, unspecified: Secondary | ICD-10-CM | POA: Diagnosis not present

## 2017-10-03 ENCOUNTER — Encounter: Payer: Self-pay | Admitting: *Deleted

## 2017-10-03 ENCOUNTER — Ambulatory Visit: Payer: Medicare Other | Admitting: Vascular Surgery

## 2017-10-03 ENCOUNTER — Encounter: Payer: Self-pay | Admitting: Vascular Surgery

## 2017-10-03 ENCOUNTER — Other Ambulatory Visit: Payer: Self-pay

## 2017-10-03 ENCOUNTER — Ambulatory Visit (INDEPENDENT_AMBULATORY_CARE_PROVIDER_SITE_OTHER): Payer: Self-pay | Admitting: Vascular Surgery

## 2017-10-03 VITALS — BP 151/87 | HR 79 | Resp 20 | Ht 70.0 in | Wt 196.1 lb

## 2017-10-03 DIAGNOSIS — N186 End stage renal disease: Secondary | ICD-10-CM

## 2017-10-03 DIAGNOSIS — D631 Anemia in chronic kidney disease: Secondary | ICD-10-CM | POA: Diagnosis not present

## 2017-10-03 DIAGNOSIS — D509 Iron deficiency anemia, unspecified: Secondary | ICD-10-CM | POA: Diagnosis not present

## 2017-10-03 DIAGNOSIS — N2581 Secondary hyperparathyroidism of renal origin: Secondary | ICD-10-CM | POA: Diagnosis not present

## 2017-10-03 NOTE — Progress Notes (Signed)
Requested by:  Benito Mccreedy, MD [address] [place], [place] [address]  Reason for consultation: New access   History of Present Illness   Andrew Brandt is a 67 y.o. (06-18-1951) male who presents for evaluation for permanent access.  Surgical history is significant for left arm brachial artery to axillary vein dialysis graft.  Despite endovascular treatment as well as surgical thrombectomy, most recently a thrombectomy by Dr. Scot Dock on 09/02/17, dialysis graft has again occluded.  The patient is R hand dominant.  The patient has not had previous access procedures on right arm.  He is currently dialyzing on a Tuesday Thursday Saturday schedule via right IJ TDC.  Vein mapping from 06/2017 showed an inadequate cephalic vein however an adequate basilic vein.  Patient would like to proceed with permanent access in right arm as soon as possible.  Past Medical History:  Diagnosis Date  . Cancer [facility])    prostate cancer  . Chronic kidney disease    Dialysis Tues-Thurs.Saturday [facility] [address]  . Constipation   . Headache   . Hypertension     Past Surgical History:  Procedure Laterality Date  . AV FISTULA PLACEMENT Left 11/23/2013   Procedure: ARTERIOVENOUS (AV) FISTULA CREATION VS. GRAFT;  Surgeon: Rosetta Posner, MD;  Location: [facility] OR;  Service: Vascular;  Laterality: Left;  . AV FISTULA PLACEMENT Left 07/12/2017   Procedure: INSERTION OF ARTERIOVENOUS (AV) GORE-TEX GRAFT ARM LEFT UPPER ARM;  Surgeon: Elam Dutch, MD;  Location: [place];  Service: Vascular;  Laterality: Left;  . COLONOSCOPY WITH PROPOFOL N/A 03/25/2015   Procedure: COLONOSCOPY WITH PROPOFOL;  Surgeon: Carol Ada, MD;  Location: [facility];  Service: Endoscopy;  Laterality: N/A;  . INSERTION OF DIALYSIS CATHETER N/A 11/23/2013   Procedure: INSERTION OF DIALYSIS CATHETER RIGHT INTERNAL JUGULAR VEIN;  Surgeon: Rosetta Posner, MD;  Location: [place];  Service: Vascular;  Laterality: N/A;  .  PROSTATECTOMY    . THROMBECTOMY AND REVISION OF ARTERIOVENTOUS (AV) GORETEX  GRAFT Left 09/02/2017   Procedure: THROMBECTOMY  OF Left Arm  ARTERIOVENTOUS (AV) GORETEX  GRAFT;  Surgeon: Angelia Mould, MD;  Location: [place];  Service: Vascular;  Laterality: Left;    Social History   Socioeconomic History  . Marital status: Divorced    Spouse name: Not on file  . Number of children: Not on file  . Years of education: Not on file  . Highest education level: Not on file  Social Needs  . Financial resource strain: Not on file  . Food insecurity - worry: Not on file  . Food insecurity - inability: Not on file  . Transportation needs - medical: Not on file  . Transportation needs - non-medical: Not on file  Occupational History  . Not on file  Tobacco Use  . Smoking status: Former Smoker    Types: Cigarettes  . Smokeless tobacco: Never Used  . Tobacco comment: quit smoking cigaretees > 40 years  Substance and Sexual Activity  . Alcohol use: Yes    Comment: Occ. beer  . Drug use: No  . Sexual activity: Not on file  Other Topics Concern  . 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 Cancer Mother   . Diabetes Unknown        grandparents    Current Outpatient Medications  Medication Sig Dispense Refill  . acetaminophen (TYLENOL) 500 MG tablet Take 1,000 mg by mouth every 6 (six) hours as needed for mild pain or headache.     . AMITIZA 24 MCG capsule Take 24 mcg by mouth 2 (two) times daily with a meal.     . b complex-vitamin c-folic acid (NEPHRO-VITE) 0.8 MG TABS tablet Take 1 tablet by mouth daily.    Marland Kitchen ibuprofen (ADVIL,MOTRIN) 200 MG tablet Take 200 mg by mouth every 6 (six) hours as needed for headache or moderate pain.    Marland Kitchen oxyCODONE-acetaminophen (ROXICET) 5-325 MG tablet Take 1  tablet by mouth every 6 (six) hours as needed for severe pain. 10 tablet 0   No current facility-administered medications for this visit.     No Known Allergies  REVIEW OF SYSTEMS (negative unless checked):   Cardiac:  []  Chest pain or chest pressure? []  Shortness of breath upon activity? []  Shortness of breath when lying flat? []  Irregular heart rhythm?  Vascular:  []  Pain in calf, thigh, or hip brought on by walking? []  Pain in feet at night that wakes you up from your sleep? []  Blood clot in your veins? []  Leg swelling?  Pulmonary:  []  Oxygen at home? []  Productive cough? []  Wheezing?  Neurologic:  []  Sudden weakness in arms or legs? []  Sudden numbness in arms or legs? []  Sudden onset of difficult speaking or slurred speech? []  Temporary loss of vision in one eye? []  Problems with dizziness?  Gastrointestinal:  []  Blood in stool? []  Vomited blood?  Genitourinary:  []  Burning when urinating? []  Blood in urine?  Psychiatric:  []  Major depression  Hematologic:  []  Bleeding problems? []  Problems with blood clotting?  Dermatologic:  []  Rashes or ulcers?  Constitutional:  []  Fever or chills?  Ear/Nose/Throat:  []  Change in hearing? []  Nose bleeds? []  Sore throat?  Musculoskeletal:  []  Back pain? []  Joint pain? []  Muscle pain?   Physical Examination     Vitals:   10/03/17 1518 10/03/17 1519  BP: (!) 148/88 (!) 151/87  Pulse: 79   Resp: 20   SpO2: 97%   Weight: 196 lb 1.6 oz (89 kg)   Height: 5\' 10"  (1.778 m)    Body mass index is 28.14 kg/m.  General Alert, O x 3, WD, NAD           Neck Supple, mid-line trachea,    Pulmonary Sym exp, good B air movt, CTA B  Cardiac RRR, Nl S1, S2,   Vascular Vessel Right Left  Radial Palpable Palpable  Brachial Palpable Palpable  Carotid Palpable, No Bruit Palpable, No Bruit  Aorta Not palpable N/A    Gastro- intestinal soft, non-distended, non-tender to palpation,   Musculo- skeletal M/S 5/5  throughout  , Extremities without ischemic changes  , No edema present, No visible varicosities , No Lipodermatosclerosis present  Neurologic Cranial nerves 2-12 intact , Pain and light touch intact in extremities , Motor exam as listed above  Psychiatric Judgement intact, Mood & affect appropriate for pt's clinical situation  Dermatologic See M/S exam for extremity exam, No rashes otherwise noted  Lymphatic  Palpable lymph nodes: None     Non-invasive Vascular Imaging   BUE Vein Mapping  (Date: 06/2017):   R arm: acceptable vein conduits include basilic  BUE Doppler (Date: 10/03/2017):  R arm:   Brachial: tri,  Radial: tri, .33 mm  Ulnar: tri, 0.2 mm   Medical Decision Making   DARRELD HOFFER is a 67 y.o. male who presents with end stage renal disease on hemodialysis via R IJ TDC   L arm AV graft again occluded; at this point we will consider this access unsalvageable  Based on 06/2017 vein mapping we will plan for R arm 1st stage basilic vein fistula  Patient has agreed and understands risks associated with surgery including steal and non functioning fistula  This will be tentatively planned for 10/14/17  Dagoberto Ligas, PA-C [facility] [place]: [phone]  10/03/2017, 3:54 PM

## 2017-10-03 NOTE — H&P (View-Only) (Signed)
Requested by:  Benito Mccreedy, MD [address] [place], [place] [address]  Reason for consultation: New access   History of Present Illness   Andrew Brandt is a 67 y.o. ([birth year]) male who presents for evaluation for permanent access.  Surgical history is significant for left arm brachial artery to axillary vein dialysis graft.  Despite endovascular treatment as well as surgical thrombectomy, most recently a thrombectomy by Dr. Scot Dock on 09/02/17, dialysis graft has again occluded.  The patient is R hand dominant.  The patient has not had previous access procedures on right arm.  He is currently dialyzing on a Tuesday Thursday Saturday schedule via right IJ TDC.  Vein mapping from 06/2017 showed an inadequate cephalic vein however an adequate basilic vein.  Patient would like to proceed with permanent access in right arm as soon as possible.  Past Medical History:  Diagnosis Date  . Cancer [facility])    prostate cancer  . Chronic kidney disease    Dialysis Tues-Thurs.[facility] [address]  . Constipation   . Headache   . Hypertension     Past Surgical History:  Procedure Laterality Date  . AV FISTULA PLACEMENT Left 11/23/2013   Procedure: ARTERIOVENOUS (AV) FISTULA CREATION VS. GRAFT;  Surgeon: Rosetta Posner, MD;  Location: [facility] OR;  Service: Vascular;  Laterality: Left;  . AV FISTULA PLACEMENT Left 07/12/2017   Procedure: INSERTION OF ARTERIOVENOUS (AV) GORE-TEX GRAFT ARM LEFT UPPER ARM;  Surgeon: Elam Dutch, MD;  Location: [place];  Service: Vascular;  Laterality: Left;  . COLONOSCOPY WITH PROPOFOL N/A 03/25/2015   Procedure: COLONOSCOPY WITH PROPOFOL;  Surgeon: Carol Ada, MD;  [facility];  Service: Endoscopy;  Laterality: N/A;  . INSERTION OF DIALYSIS CATHETER N/A 11/23/2013   Procedure: INSERTION OF DIALYSIS CATHETER RIGHT INTERNAL JUGULAR VEIN;  Surgeon: Rosetta Posner, MD;  Location: [place];  Service: Vascular;  Laterality: N/A;  .  PROSTATECTOMY    . THROMBECTOMY AND REVISION OF ARTERIOVENTOUS (AV) GORETEX  GRAFT Left 09/02/2017   Procedure: THROMBECTOMY  OF Left Arm  ARTERIOVENTOUS (AV) GORETEX  GRAFT;  Surgeon: Angelia Mould, MD;  Location: [place];  Service: Vascular;  Laterality: Left;    Social History   Socioeconomic History  . Marital status: Divorced    Spouse name: Not on file  . Number of children: Not on file  . Years of education: Not on file  . Highest education level: Not on file  Social Needs  . Financial resource strain: Not on file  . Food insecurity - worry: Not on file  . Food insecurity - inability: Not on file  . Transportation needs - medical: Not on file  . Transportation needs - non-medical: Not on file  Occupational History  . Not on file  Tobacco Use  . Smoking status: Former Smoker    Types: Cigarettes  . Smokeless tobacco: Never Used  . Tobacco comment: quit smoking cigaretees > 40 years  Substance and Sexual Activity  . Alcohol use: Yes    Comment: Occ. beer  . Drug use: No  . Sexual activity: Not on file  Other Topics Concern  . 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 Cancer Mother   . Diabetes Unknown        grandparents    Current Outpatient Medications  Medication Sig Dispense Refill  . acetaminophen (TYLENOL) 500 MG tablet Take 1,000 mg by mouth every 6 (six) hours as needed for mild pain or headache.     . AMITIZA 24 MCG capsule Take 24 mcg by mouth 2 (two) times daily with a meal.     . b complex-vitamin c-folic acid (NEPHRO-VITE) 0.8 MG TABS tablet Take 1 tablet by mouth daily.    Marland Kitchen ibuprofen (ADVIL,MOTRIN) 200 MG tablet Take 200 mg by mouth every 6 (six) hours as needed for headache or moderate pain.    Marland Kitchen oxyCODONE-acetaminophen (ROXICET) 5-325 MG tablet Take 1  tablet by mouth every 6 (six) hours as needed for severe pain. 10 tablet 0   No current facility-administered medications for this visit.     No Known Allergies  REVIEW OF SYSTEMS (negative unless checked):   Cardiac:  []  Chest pain or chest pressure? []  Shortness of breath upon activity? []  Shortness of breath when lying flat? []  Irregular heart rhythm?  Vascular:  []  Pain in calf, thigh, or hip brought on by walking? []  Pain in feet at night that wakes you up from your sleep? []  Blood clot in your veins? []  Leg swelling?  Pulmonary:  []  Oxygen at home? []  Productive cough? []  Wheezing?  Neurologic:  []  Sudden weakness in arms or legs? []  Sudden numbness in arms or legs? []  Sudden onset of difficult speaking or slurred speech? []  Temporary loss of vision in one eye? []  Problems with dizziness?  Gastrointestinal:  []  Blood in stool? []  Vomited blood?  Genitourinary:  []  Burning when urinating? []  Blood in urine?  Psychiatric:  []  Major depression  Hematologic:  []  Bleeding problems? []  Problems with blood clotting?  Dermatologic:  []  Rashes or ulcers?  Constitutional:  []  Fever or chills?  Ear/Nose/Throat:  []  Change in hearing? []  Nose bleeds? []  Sore throat?  Musculoskeletal:  []  Back pain? []  Joint pain? []  Muscle pain?   Physical Examination     Vitals:   10/03/17 1518 10/03/17 1519  BP: (!) 148/88 (!) 151/87  Pulse: 79   Resp: 20   SpO2: 97%   Weight: 196 lb 1.6 oz (89 kg)   Height: 5\' 10"  (1.778 m)    Body mass index is 28.14 kg/m.  General Alert, O x 3, WD, NAD           Neck Supple, mid-line trachea,    Pulmonary Sym exp, good B air movt, CTA B  Cardiac RRR, Nl S1, S2,   Vascular Vessel Right Left  Radial Palpable Palpable  Brachial Palpable Palpable  Carotid Palpable, No Bruit Palpable, No Bruit  Aorta Not palpable N/A    Gastro- intestinal soft, non-distended, non-tender to palpation,   Musculo- skeletal M/S 5/5  throughout  , Extremities without ischemic changes  , No edema present, No visible varicosities , No Lipodermatosclerosis present  Neurologic Cranial nerves 2-12 intact , Pain and light touch intact in extremities , Motor exam as listed above  Psychiatric Judgement intact, Mood & affect appropriate for pt's clinical situation  Dermatologic See M/S exam for extremity exam, No rashes otherwise noted  Lymphatic  Palpable lymph nodes: None     Non-invasive Vascular Imaging   BUE Vein Mapping  (Date: 06/2017):   R arm: acceptable vein conduits include basilic  BUE Doppler (Date: 10/03/2017):  R arm:   Brachial: tri,  Radial: tri, .33 mm  Ulnar: tri, 0.2 mm   Medical Decision Making   Andrew Brandt is a 67 y.o. male who presents with end stage renal disease on hemodialysis via R IJ TDC   L arm AV graft again occluded; at this point we will consider this access unsalvageable  Based on 06/2017 vein mapping we will plan for R arm 1st stage basilic vein fistula  Patient has agreed and understands risks associated with surgery including steal and non functioning fistula  This will be tentatively planned for 10/14/17  Dagoberto Ligas, PA-C [facility] [place]: [phone]  10/03/2017, 3:54 PM

## 2017-10-05 DIAGNOSIS — D631 Anemia in chronic kidney disease: Secondary | ICD-10-CM | POA: Diagnosis not present

## 2017-10-05 DIAGNOSIS — N186 End stage renal disease: Secondary | ICD-10-CM | POA: Diagnosis not present

## 2017-10-05 DIAGNOSIS — N2581 Secondary hyperparathyroidism of renal origin: Secondary | ICD-10-CM | POA: Diagnosis not present

## 2017-10-05 DIAGNOSIS — D509 Iron deficiency anemia, unspecified: Secondary | ICD-10-CM | POA: Diagnosis not present

## 2017-10-08 ENCOUNTER — Other Ambulatory Visit: Payer: Self-pay | Admitting: *Deleted

## 2017-10-08 DIAGNOSIS — N186 End stage renal disease: Secondary | ICD-10-CM | POA: Diagnosis not present

## 2017-10-08 DIAGNOSIS — D509 Iron deficiency anemia, unspecified: Secondary | ICD-10-CM | POA: Diagnosis not present

## 2017-10-08 DIAGNOSIS — D631 Anemia in chronic kidney disease: Secondary | ICD-10-CM | POA: Diagnosis not present

## 2017-10-08 DIAGNOSIS — N2581 Secondary hyperparathyroidism of renal origin: Secondary | ICD-10-CM | POA: Diagnosis not present

## 2017-10-10 DIAGNOSIS — D509 Iron deficiency anemia, unspecified: Secondary | ICD-10-CM | POA: Diagnosis not present

## 2017-10-10 DIAGNOSIS — N2581 Secondary hyperparathyroidism of renal origin: Secondary | ICD-10-CM | POA: Diagnosis not present

## 2017-10-10 DIAGNOSIS — N186 End stage renal disease: Secondary | ICD-10-CM | POA: Diagnosis not present

## 2017-10-10 DIAGNOSIS — D631 Anemia in chronic kidney disease: Secondary | ICD-10-CM | POA: Diagnosis not present

## 2017-10-12 DIAGNOSIS — D509 Iron deficiency anemia, unspecified: Secondary | ICD-10-CM | POA: Diagnosis not present

## 2017-10-12 DIAGNOSIS — D631 Anemia in chronic kidney disease: Secondary | ICD-10-CM | POA: Diagnosis not present

## 2017-10-12 DIAGNOSIS — N2581 Secondary hyperparathyroidism of renal origin: Secondary | ICD-10-CM | POA: Diagnosis not present

## 2017-10-12 DIAGNOSIS — N186 End stage renal disease: Secondary | ICD-10-CM | POA: Diagnosis not present

## 2017-10-15 DIAGNOSIS — N186 End stage renal disease: Secondary | ICD-10-CM | POA: Diagnosis not present

## 2017-10-15 DIAGNOSIS — D631 Anemia in chronic kidney disease: Secondary | ICD-10-CM | POA: Diagnosis not present

## 2017-10-15 DIAGNOSIS — N2581 Secondary hyperparathyroidism of renal origin: Secondary | ICD-10-CM | POA: Diagnosis not present

## 2017-10-15 DIAGNOSIS — D509 Iron deficiency anemia, unspecified: Secondary | ICD-10-CM | POA: Diagnosis not present

## 2017-10-16 DIAGNOSIS — N184 Chronic kidney disease, stage 4 (severe): Secondary | ICD-10-CM | POA: Diagnosis not present

## 2017-10-16 DIAGNOSIS — I10 Essential (primary) hypertension: Secondary | ICD-10-CM | POA: Diagnosis not present

## 2017-10-17 DIAGNOSIS — N2581 Secondary hyperparathyroidism of renal origin: Secondary | ICD-10-CM | POA: Diagnosis not present

## 2017-10-17 DIAGNOSIS — D631 Anemia in chronic kidney disease: Secondary | ICD-10-CM | POA: Diagnosis not present

## 2017-10-17 DIAGNOSIS — N186 End stage renal disease: Secondary | ICD-10-CM | POA: Diagnosis not present

## 2017-10-17 DIAGNOSIS — D509 Iron deficiency anemia, unspecified: Secondary | ICD-10-CM | POA: Diagnosis not present

## 2017-10-18 ENCOUNTER — Encounter (HOSPITAL_COMMUNITY): Payer: Self-pay | Admitting: *Deleted

## 2017-10-18 ENCOUNTER — Other Ambulatory Visit: Payer: Self-pay

## 2017-10-18 NOTE — Progress Notes (Signed)
Spoke with pt for pre-op call. Pt denies cardiac and diabetes history. Pt states he no longer takes medication for HTN.

## 2017-10-19 DIAGNOSIS — D509 Iron deficiency anemia, unspecified: Secondary | ICD-10-CM | POA: Diagnosis not present

## 2017-10-19 DIAGNOSIS — N2581 Secondary hyperparathyroidism of renal origin: Secondary | ICD-10-CM | POA: Diagnosis not present

## 2017-10-19 DIAGNOSIS — N186 End stage renal disease: Secondary | ICD-10-CM | POA: Diagnosis not present

## 2017-10-19 DIAGNOSIS — D631 Anemia in chronic kidney disease: Secondary | ICD-10-CM | POA: Diagnosis not present

## 2017-10-20 ENCOUNTER — Encounter (HOSPITAL_COMMUNITY): Payer: Self-pay | Admitting: Anesthesiology

## 2017-10-20 NOTE — Anesthesia Preprocedure Evaluation (Addendum)
Anesthesia Evaluation  Patient identified by MRN, date of birth, ID band Patient awake    Reviewed: Allergy & Precautions, NPO status , Patient's Chart, lab work & pertinent test results  Airway Mallampati: I  TM Distance: >3 FB Neck ROM: Full    Dental  (+) Missing, Partial Upper,    Pulmonary former smoker,    breath sounds clear to auscultation       Cardiovascular hypertension,  Rhythm:Regular Rate:Normal + Systolic murmurs    Neuro/Psych  Headaches,    GI/Hepatic negative GI ROS, Neg liver ROS,   Endo/Other  negative endocrine ROS  Renal/GU ESRF and DialysisRenal disease  negative genitourinary   Musculoskeletal negative musculoskeletal ROS (+)   Abdominal Normal abdominal exam  (+)   Peds  Hematology  (+) anemia ,   Anesthesia Other Findings   Reproductive/Obstetrics                            Anesthesia Physical Anesthesia Plan  ASA: III  Anesthesia Plan: MAC   Post-op Pain Management:    Induction: Intravenous  PONV Risk Score and Plan: 2 and Ondansetron and Propofol infusion  Airway Management Planned: Simple Face Mask  Additional Equipment: None  Intra-op Plan:   Post-operative Plan:   Informed Consent: I have reviewed the patients History and Physical, chart, labs and discussed the procedure including the risks, benefits and alternatives for the proposed anesthesia with the patient or authorized representative who has indicated his/her understanding and acceptance.   Dental advisory given  Plan Discussed with: CRNA  Anesthesia Plan Comments:        Anesthesia Quick Evaluation

## 2017-10-21 ENCOUNTER — Ambulatory Visit (HOSPITAL_COMMUNITY)
Admission: RE | Admit: 2017-10-21 | Discharge: 2017-10-21 | Disposition: A | Discharge status: Home / Self Care | Payer: Medicare Other | Source: Ambulatory Visit | Attending: Vascular Surgery | Admitting: Vascular Surgery

## 2017-10-21 ENCOUNTER — Ambulatory Visit (HOSPITAL_COMMUNITY): Payer: Medicare Other | Admitting: Anesthesiology

## 2017-10-21 ENCOUNTER — Encounter (HOSPITAL_COMMUNITY)
Admission: RE | Disposition: A | Discharge status: Home / Self Care | Payer: Self-pay | Source: Ambulatory Visit | Attending: Vascular Surgery

## 2017-10-21 ENCOUNTER — Encounter (HOSPITAL_COMMUNITY): Payer: Self-pay

## 2017-10-21 ENCOUNTER — Other Ambulatory Visit: Payer: Self-pay

## 2017-10-21 DIAGNOSIS — D509 Iron deficiency anemia, unspecified: Secondary | ICD-10-CM | POA: Diagnosis not present

## 2017-10-21 DIAGNOSIS — Z992 Dependence on renal dialysis: Secondary | ICD-10-CM | POA: Diagnosis not present

## 2017-10-21 DIAGNOSIS — Z87891 Personal history of nicotine dependence: Secondary | ICD-10-CM | POA: Diagnosis not present

## 2017-10-21 DIAGNOSIS — I120 Hypertensive chronic kidney disease with stage 5 chronic kidney disease or end stage renal disease: Secondary | ICD-10-CM | POA: Insufficient documentation

## 2017-10-21 DIAGNOSIS — Z8546 Personal history of malignant neoplasm of prostate: Secondary | ICD-10-CM | POA: Insufficient documentation

## 2017-10-21 DIAGNOSIS — Z79899 Other long term (current) drug therapy: Secondary | ICD-10-CM | POA: Diagnosis not present

## 2017-10-21 DIAGNOSIS — E669 Obesity, unspecified: Secondary | ICD-10-CM | POA: Diagnosis not present

## 2017-10-21 DIAGNOSIS — N186 End stage renal disease: Secondary | ICD-10-CM | POA: Diagnosis not present

## 2017-10-21 DIAGNOSIS — D631 Anemia in chronic kidney disease: Secondary | ICD-10-CM | POA: Diagnosis not present

## 2017-10-21 DIAGNOSIS — T82511D Breakdown (mechanical) of surgically created arteriovenous shunt, subsequent encounter: Secondary | ICD-10-CM | POA: Diagnosis not present

## 2017-10-21 DIAGNOSIS — D696 Thrombocytopenia, unspecified: Secondary | ICD-10-CM | POA: Diagnosis not present

## 2017-10-21 DIAGNOSIS — Z6828 Body mass index (BMI) 28.0-28.9, adult: Secondary | ICD-10-CM | POA: Insufficient documentation

## 2017-10-21 DIAGNOSIS — N2581 Secondary hyperparathyroidism of renal origin: Secondary | ICD-10-CM | POA: Diagnosis not present

## 2017-10-21 DIAGNOSIS — N185 Chronic kidney disease, stage 5: Secondary | ICD-10-CM | POA: Diagnosis not present

## 2017-10-21 HISTORY — PX: AV FISTULA PLACEMENT: SHX1204

## 2017-10-21 LAB — POCT I-STAT 4, (NA,K, GLUC, HGB,HCT)
Glucose, Bld: 80 mg/dL (ref 65–99)
HCT: 32 % — ABNORMAL LOW (ref 39.0–52.0)
Hemoglobin: 10.9 g/dL — ABNORMAL LOW (ref 13.0–17.0)
Potassium: 3.7 mmol/L (ref 3.5–5.1)
Sodium: 141 mmol/L (ref 135–145)

## 2017-10-21 SURGERY — ARTERIOVENOUS (AV) FISTULA CREATION
Anesthesia: Monitor Anesthesia Care | Site: Arm Lower | Laterality: Right

## 2017-10-21 MED ORDER — PROPOFOL 500 MG/50ML IV EMUL
INTRAVENOUS | Status: DC | PRN
  Administered 2017-10-21: 50 ug/kg/min via INTRAVENOUS

## 2017-10-21 MED ORDER — PHENYLEPHRINE 40 MCG/ML (10ML) SYRINGE FOR IV PUSH (FOR BLOOD PRESSURE SUPPORT)
PREFILLED_SYRINGE | INTRAVENOUS | Status: AC
Start: 2017-10-21 — End: 2017-10-21
  Filled 2017-10-21: qty 30

## 2017-10-21 MED ORDER — SODIUM CHLORIDE 0.9 % IV SOLN: INTRAVENOUS | Status: DC

## 2017-10-21 MED ORDER — PHENYLEPHRINE HCL 10 MG/ML IJ SOLN
INTRAVENOUS | Status: DC | PRN
  Administered 2017-10-21: 25 ug/min via INTRAVENOUS

## 2017-10-21 MED ORDER — FENTANYL CITRATE (PF) 100 MCG/2ML IJ SOLN: 25.0000 ug | INTRAMUSCULAR | Status: DC | PRN

## 2017-10-21 MED ORDER — SODIUM CHLORIDE 0.9 % IV SOLN
INTRAVENOUS | Status: AC
  Filled 2017-10-21: qty 1.2

## 2017-10-21 MED ORDER — LIDOCAINE HCL (PF) 1 % IJ SOLN
INTRAMUSCULAR | Status: AC
  Filled 2017-10-21: qty 30

## 2017-10-21 MED ORDER — HEPARIN SODIUM (PORCINE) 1000 UNIT/ML IJ SOLN
INTRAMUSCULAR | Status: AC
  Filled 2017-10-21: qty 1

## 2017-10-21 MED ORDER — CEFAZOLIN SODIUM-DEXTROSE 2-4 GM/100ML-% IV SOLN
2.0000 g | INTRAVENOUS | Status: AC
  Administered 2017-10-21: 2 g via INTRAVENOUS
  Filled 2017-10-21: qty 100

## 2017-10-21 MED ORDER — PROMETHAZINE HCL 25 MG/ML IJ SOLN
6.2500 mg | INTRAMUSCULAR | Status: DC | PRN
Start: 2017-10-21 — End: 2017-10-21

## 2017-10-21 MED ORDER — ONDANSETRON HCL 4 MG/2ML IJ SOLN
INTRAMUSCULAR | Status: AC
  Filled 2017-10-21: qty 2

## 2017-10-21 MED ORDER — LACTATED RINGERS IV SOLN: INTRAVENOUS | Status: DC

## 2017-10-21 MED ORDER — HEPARIN SODIUM (PORCINE) 1000 UNIT/ML IJ SOLN
INTRAMUSCULAR | Status: DC | PRN
  Administered 2017-10-21: 3000 [IU] via INTRAVENOUS

## 2017-10-21 MED ORDER — ONDANSETRON HCL 4 MG/2ML IJ SOLN
INTRAMUSCULAR | Status: DC | PRN
  Administered 2017-10-21: 4 mg via INTRAVENOUS

## 2017-10-21 MED ORDER — PHENYLEPHRINE 40 MCG/ML (10ML) SYRINGE FOR IV PUSH (FOR BLOOD PRESSURE SUPPORT)
PREFILLED_SYRINGE | INTRAVENOUS | Status: DC | PRN
  Administered 2017-10-21: 80 ug via INTRAVENOUS
  Administered 2017-10-21: 40 ug via INTRAVENOUS
  Administered 2017-10-21: 80 ug via INTRAVENOUS
  Administered 2017-10-21: 40 ug via INTRAVENOUS

## 2017-10-21 MED ORDER — MIDAZOLAM HCL 2 MG/2ML IJ SOLN
INTRAMUSCULAR | Status: AC
  Filled 2017-10-21: qty 2

## 2017-10-21 MED ORDER — LIDOCAINE HCL (CARDIAC) 20 MG/ML IV SOLN
INTRAVENOUS | Status: AC
  Filled 2017-10-21: qty 10

## 2017-10-21 MED ORDER — MEPERIDINE HCL 50 MG/ML IJ SOLN: 6.2500 mg | INTRAMUSCULAR | Status: DC | PRN

## 2017-10-21 MED ORDER — EPHEDRINE 5 MG/ML INJ
INTRAVENOUS | Status: AC
Start: 2017-10-21 — End: 2017-10-21
  Filled 2017-10-21: qty 10

## 2017-10-21 MED ORDER — CHLORHEXIDINE GLUCONATE 4 % EX LIQD: 60.0000 mL | Freq: Once | CUTANEOUS | Status: DC

## 2017-10-21 MED ORDER — OXYCODONE-ACETAMINOPHEN 5-325 MG PO TABS: 1.0000 | ORAL_TABLET | Freq: Four times a day (QID) | ORAL | 0 refills | Status: DC | PRN

## 2017-10-21 MED ORDER — FENTANYL CITRATE (PF) 100 MCG/2ML IJ SOLN
INTRAMUSCULAR | Status: DC | PRN
  Administered 2017-10-21 (×2): 50 ug via INTRAVENOUS

## 2017-10-21 MED ORDER — SODIUM CHLORIDE 0.9 % IV SOLN
INTRAVENOUS | Status: DC | PRN
  Administered 2017-10-21: 08:00:00 via INTRAVENOUS

## 2017-10-21 MED ORDER — MIDAZOLAM HCL 5 MG/5ML IJ SOLN
INTRAMUSCULAR | Status: DC | PRN
  Administered 2017-10-21 (×2): 1 mg via INTRAVENOUS

## 2017-10-21 MED ORDER — ROCURONIUM BROMIDE 10 MG/ML (PF) SYRINGE
PREFILLED_SYRINGE | INTRAVENOUS | Status: AC
  Filled 2017-10-21: qty 10

## 2017-10-21 MED ORDER — FENTANYL CITRATE (PF) 250 MCG/5ML IJ SOLN
INTRAMUSCULAR | Status: AC
  Filled 2017-10-21: qty 5

## 2017-10-21 MED ORDER — LIDOCAINE HCL (PF) 1 % IJ SOLN
INTRAMUSCULAR | Status: DC | PRN
  Administered 2017-10-21: 7 mL

## 2017-10-21 MED ORDER — SODIUM CHLORIDE 0.9 % IV SOLN
INTRAVENOUS | Status: DC | PRN
  Administered 2017-10-21: 500 mL

## 2017-10-21 MED ORDER — LIDOCAINE 2% (20 MG/ML) 5 ML SYRINGE
INTRAMUSCULAR | Status: DC | PRN
  Administered 2017-10-21: 40 mg via INTRAVENOUS

## 2017-10-21 MED ORDER — 0.9 % SODIUM CHLORIDE (POUR BTL) OPTIME
TOPICAL | Status: DC | PRN
  Administered 2017-10-21: 1000 mL

## 2017-10-21 MED ORDER — PROMETHAZINE HCL 25 MG/ML IJ SOLN: 6.2500 mg | INTRAMUSCULAR | Status: DC | PRN

## 2017-10-21 SURGICAL SUPPLY — 36 items
ARMBAND PINK RESTRICT EXTREMIT (MISCELLANEOUS) ×2 IMPLANT
CANISTER SUCT 3000ML PPV (MISCELLANEOUS) ×2 IMPLANT
CANNULA VESSEL 3MM 2 BLNT TIP (CANNULA) ×2 IMPLANT
CLIP VESOCCLUDE MED 6/CT (CLIP) ×2 IMPLANT
CLIP VESOCCLUDE SM WIDE 6/CT (CLIP) ×2 IMPLANT
COVER PROBE W GEL 5X96 (DRAPES) ×2 IMPLANT
DECANTER SPIKE VIAL GLASS SM (MISCELLANEOUS) ×2 IMPLANT
DERMABOND ADVANCED (GAUZE/BANDAGES/DRESSINGS) ×1
DERMABOND ADVANCED .7 DNX12 (GAUZE/BANDAGES/DRESSINGS) ×1 IMPLANT
DRAIN PENROSE 1/4X12 LTX STRL (WOUND CARE) ×2 IMPLANT
ELECT REM PT RETURN 9FT ADLT (ELECTROSURGICAL) ×2
ELECTRODE REM PT RTRN 9FT ADLT (ELECTROSURGICAL) ×1 IMPLANT
GLOVE BIO SURGEON STRL SZ 6.5 (GLOVE) ×2 IMPLANT
GLOVE BIO SURGEON STRL SZ7.5 (GLOVE) ×2 IMPLANT
GLOVE BIOGEL PI IND STRL 6.5 (GLOVE) ×3 IMPLANT
GLOVE BIOGEL PI IND STRL 7.5 (GLOVE) ×1 IMPLANT
GLOVE BIOGEL PI INDICATOR 6.5 (GLOVE) ×3
GLOVE BIOGEL PI INDICATOR 7.5 (GLOVE) ×1
GLOVE ECLIPSE 7.0 STRL STRAW (GLOVE) ×2 IMPLANT
GOWN STRL REUS W/ TWL LRG LVL3 (GOWN DISPOSABLE) ×3 IMPLANT
GOWN STRL REUS W/TWL LRG LVL3 (GOWN DISPOSABLE) ×3
KIT BASIN OR (CUSTOM PROCEDURE TRAY) ×2 IMPLANT
KIT TURNOVER KIT B (KITS) ×2 IMPLANT
LOOP VESSEL MINI RED (MISCELLANEOUS) IMPLANT
NS IRRIG 1000ML POUR BTL (IV SOLUTION) ×2 IMPLANT
PACK CV ACCESS (CUSTOM PROCEDURE TRAY) ×2 IMPLANT
PAD ARMBOARD 7.5X6 YLW CONV (MISCELLANEOUS) ×4 IMPLANT
SPONGE SURGIFOAM ABS GEL 100 (HEMOSTASIS) IMPLANT
SUT PROLENE 6 0 BV (SUTURE) ×2 IMPLANT
SUT PROLENE 7 0 BV 1 (SUTURE) ×2 IMPLANT
SUT VIC AB 3-0 SH 27 (SUTURE) ×1
SUT VIC AB 3-0 SH 27X BRD (SUTURE) ×1 IMPLANT
SUT VICRYL 4-0 PS2 18IN ABS (SUTURE) ×2 IMPLANT
TOWEL GREEN STERILE (TOWEL DISPOSABLE) ×2 IMPLANT
UNDERPAD 30X30 (UNDERPADS AND DIAPERS) ×2 IMPLANT
WATER STERILE IRR 1000ML POUR (IV SOLUTION) ×2 IMPLANT

## 2017-10-21 NOTE — Anesthesia Procedure Notes (Signed)
Procedure Name: MAC Date/Time: 10/21/2017 7:40 AM Performed by: Renato Shin, CRNA Pre-anesthesia Checklist: Patient identified, Emergency Drugs available, Suction available and Patient being monitored Patient Re-evaluated:Patient Re-evaluated prior to induction Oxygen Delivery Method: Simple face mask Preoxygenation: Pre-oxygenation with 100% oxygen Induction Type: IV induction Placement Confirmation: positive ETCO2,  CO2 detector and breath sounds checked- equal and bilateral Dental Injury: Teeth and Oropharynx as per pre-operative assessment

## 2017-10-21 NOTE — Transfer of Care (Signed)
Immediate Anesthesia Transfer of Care Note  Patient: Andrew Brandt  Procedure(s) Performed: ARTERIOVENOUS ARM (AV) FISTULA CREATION (Right Arm Lower)  Patient Location: PACU  Anesthesia Type:MAC  Level of Consciousness: awake, alert , oriented and patient cooperative  Airway & Oxygen Therapy: Patient Spontanous Breathing and Patient connected to nasal cannula oxygen  Post-op Assessment: Report given to RN and Post -op Vital signs reviewed and stable  Post vital signs: Reviewed and stable  Last Vitals:  Vitals Value Taken Time  BP    Temp    Pulse 61 10/21/2017  8:54 AM  Resp 11 10/21/2017  8:54 AM  SpO2 97 % 10/21/2017  8:54 AM  Vitals shown include unvalidated device data.  Last Pain:  Vitals:   10/21/17 0637  TempSrc:   PainSc: 0-No pain      Patients Stated Pain Goal: 3 (34/28/76 8115)  Complications: No apparent anesthesia complications

## 2017-10-21 NOTE — Anesthesia Postprocedure Evaluation (Signed)
Anesthesia Post Note  Patient: Andrew Brandt  Procedure(s) Performed: ARTERIOVENOUS ARM (AV) FISTULA CREATION (Right Arm Lower)     Patient location during evaluation: PACU Anesthesia Type: MAC Level of consciousness: awake and alert Pain management: pain level controlled Vital Signs Assessment: post-procedure vital signs reviewed and stable Respiratory status: spontaneous breathing, nonlabored ventilation, respiratory function stable and patient connected to nasal cannula oxygen Cardiovascular status: stable and blood pressure returned to baseline Postop Assessment: no apparent nausea or vomiting Anesthetic complications: no    Last Vitals:  Vitals:   10/21/17 1000 10/21/17 1010  BP: 138/78 (!) 152/89  Pulse: (!) 56 (!) 52  Resp: 11 14  Temp: (!) 36.2 C   SpO2: 98% 100%    Last Pain:  Vitals:   10/21/17 1010  TempSrc:   PainSc: 0-No pain                 Effie Berkshire

## 2017-10-21 NOTE — Op Note (Signed)
Procedure: Right Radial Cephalic AV fistula   Preop: ESRD   Postop: ESRD   Anesthesia: MAC with local   Assistant:Maureen Collins, PA-c   Findings: 3 mm cephalic vein   3 mm radial artery   Procedure Details:  The right upper extremity was prepped and draped in usual sterile fashion. Local anesthesia was infiltrated midway between the cephalic and radial artery anatomically. The vein was not palpable due to the patients obesity. Ultrasound was used to identify the location of the vein. A longitudinal skin incision was then made in this location at the distal left forearm. The incision was carried into the subcutaneous tissues down to level cephalic vein. The vein had some spasm but was overall reasonable quality accepting a 4.5 mm dilator. The vein was dissected free circumferentially and small side branches ligated and divided between silk ties. The distal end was ligated and the vein probed and found to accept up to a 4.5 mm dilator. This was gently distended with heparinized saline, spatulated, and marked for orientation. Next the radial artery was dissected free in the medial portion incision. The artery was 3 mm in diameter but had a reasonable pulse. The vessel loops were placed proximal and distal to the planned site of arteriotomy. The patient was given 3000 units of intravenous heparin. After appropriate circulation time, the vessel loops were used to control the artery. A longitudinal opening was made in the left radial artery. The vein was controlled proximally with a fine bulldog clamp. The vein was then swung over to the artery and sewn end of vein to side of artery using a running 7-0 Prolene suture. Just prior to completion, the anastomosis was fore bled back bled and thoroughly flushed. The anastomosis was secured, vessel loops released, and there was a palpable thrill in the fistula immediately. After hemostasis was obtained, the subcutaneous tissues were reapproximated using a running  3-0 Vicryl suture. The skin was then closed with a 4 0 Vicryl subcuticular stitch. Dermabond was applied to the skin incision. The patient tolerated the procedure well and there were no complications. Instrument sponge and needle count were correct at the end of the case. The patient was taken to PACU in stable condition.   [name], MD  [facility] [place]  Office: [phone]  Pager: [phone]

## 2017-10-21 NOTE — Interval H&P Note (Signed)
History and Physical Interval Note:  10/21/2017 7:28 AM  Andrew Brandt  has presented today for surgery, with the diagnosis of end stage renal disease  The various methods of treatment have been discussed with the patient and family. After consideration of risks, benefits and other options for treatment, the patient has consented to  Procedure(s): ARTERIOVENOUS ARM (AV) FISTULA CREATION VS GRAFT INSERTIION (Right) as a surgical intervention .  The patient's history has been reviewed, patient examined, no change in status, stable for surgery.  I have reviewed the patient's chart and labs.  Questions were answered to the patient's satisfaction.     [name]

## 2017-10-21 NOTE — Discharge Instructions (Signed)
° °  [facility] [place] ° °Discharge Instructions ° °AV Fistula or Graft Surgery for Dialysis Access ° °Please refer to the following instructions for your post-procedure care. Your surgeon or physician assistant will discuss any changes with you. ° °Activity ° °You may drive the day following your surgery, if you are comfortable and no longer taking prescription pain medication. Resume full activity as the soreness in your incision resolves. ° °Bathing/Showering ° °You may shower after you go home. Keep your incision dry for 48 hours. Do not soak in a bathtub, hot tub, or swim until the incision heals completely. You may not shower if you have a hemodialysis catheter. ° °Incision Care ° °Clean your incision with mild soap and water after 48 hours. Pat the area dry with a clean towel. You do not need a bandage unless otherwise instructed. Do not apply any ointments or creams to your incision. You may have skin glue on your incision. Do not peel it off. It will come off on its own in about one week. Your arm may swell a bit after surgery. To reduce swelling use pillows to elevate your arm so it is above your heart. Your doctor will tell you if you need to lightly wrap your arm with an ACE bandage. ° °Diet ° °Resume your normal diet. There are not special food restrictions following this procedure. In order to heal from your surgery, it is CRITICAL to get adequate nutrition. Your body requires vitamins, minerals, and protein. Vegetables are the best source of vitamins and minerals. Vegetables also provide the perfect balance of protein. Processed food has little nutritional value, so try to avoid this. ° °Medications ° °Resume taking all of your medications. If your incision is causing pain, you may take over-the counter pain relievers such as acetaminophen (Tylenol). If you were prescribed a stronger pain medication, please be aware these medications can cause nausea and constipation. Prevent  nausea by taking the medication with a snack or meal. Avoid constipation by drinking plenty of fluids and eating foods with high amount of fiber, such as fruits, vegetables, and grains. Do not take Tylenol if you are taking prescription pain medications. ° ° ° ° °Follow up °Your surgeon may want to see you in the office following your access surgery. If so, this will be arranged at the time of your surgery. ° °Please call us immediately for any of the following conditions: ° °Increased pain, redness, drainage (pus) from your incision site °Fever of 101 degrees or higher °Severe or worsening pain at your incision site °Hand pain or numbness. ° °Reduce your risk of vascular disease: ° °Stop smoking. If you would like help, call [facility] at 1-800-QUIT-NOW (1-800-784-8669) or [place] at 336-586-4000 ° °Manage your cholesterol °Maintain a desired weight °Control your diabetes °Keep your blood pressure down ° °Dialysis ° °It will take several weeks to several months for your new dialysis access to be ready for use. Your surgeon will determine when it is OK to use it. Your nephrologist will continue to direct your dialysis. You can continue to use your Permcath until your new access is ready for use. ° °If you have any questions, please call the office at [phone]. ° °

## 2017-10-22 ENCOUNTER — Encounter (HOSPITAL_COMMUNITY): Payer: Self-pay | Admitting: Vascular Surgery

## 2017-10-22 DIAGNOSIS — Z992 Dependence on renal dialysis: Secondary | ICD-10-CM | POA: Diagnosis not present

## 2017-10-22 DIAGNOSIS — N2581 Secondary hyperparathyroidism of renal origin: Secondary | ICD-10-CM | POA: Diagnosis not present

## 2017-10-22 DIAGNOSIS — D631 Anemia in chronic kidney disease: Secondary | ICD-10-CM | POA: Diagnosis not present

## 2017-10-22 DIAGNOSIS — I120 Hypertensive chronic kidney disease with stage 5 chronic kidney disease or end stage renal disease: Secondary | ICD-10-CM | POA: Diagnosis not present

## 2017-10-22 DIAGNOSIS — D509 Iron deficiency anemia, unspecified: Secondary | ICD-10-CM | POA: Diagnosis not present

## 2017-10-22 DIAGNOSIS — N186 End stage renal disease: Secondary | ICD-10-CM | POA: Diagnosis not present

## 2017-10-22 DIAGNOSIS — T82511D Breakdown (mechanical) of surgically created arteriovenous shunt, subsequent encounter: Secondary | ICD-10-CM | POA: Diagnosis not present

## 2017-10-23 ENCOUNTER — Telehealth: Payer: Self-pay | Admitting: Vascular Surgery

## 2017-10-23 NOTE — Telephone Encounter (Signed)
LVM for pt on mobile # [facility] for appts on 5/8 [place] & OV  Mld lttr

## 2017-10-23 NOTE — Telephone Encounter (Signed)
-----   Message from Mena Goes, RN sent at 10/21/2017  3:23 PM EDT ----- Regarding: 4-6 weeks w/ duplex   ----- Message ----- From: Ulyses Amor, PA-C Sent: 10/21/2017   8:52 AM To: [facility] Charge Pool  F/U in [facility] in 4-6 weeks with duplex of fistula right UE.

## 2017-10-24 DIAGNOSIS — D631 Anemia in chronic kidney disease: Secondary | ICD-10-CM | POA: Diagnosis not present

## 2017-10-24 DIAGNOSIS — I120 Hypertensive chronic kidney disease with stage 5 chronic kidney disease or end stage renal disease: Secondary | ICD-10-CM | POA: Diagnosis not present

## 2017-10-24 DIAGNOSIS — N2581 Secondary hyperparathyroidism of renal origin: Secondary | ICD-10-CM | POA: Diagnosis not present

## 2017-10-24 DIAGNOSIS — T82511D Breakdown (mechanical) of surgically created arteriovenous shunt, subsequent encounter: Secondary | ICD-10-CM | POA: Diagnosis not present

## 2017-10-24 DIAGNOSIS — D509 Iron deficiency anemia, unspecified: Secondary | ICD-10-CM | POA: Diagnosis not present

## 2017-10-24 DIAGNOSIS — Z992 Dependence on renal dialysis: Secondary | ICD-10-CM | POA: Diagnosis not present

## 2017-10-24 DIAGNOSIS — N186 End stage renal disease: Secondary | ICD-10-CM | POA: Diagnosis not present

## 2017-10-26 DIAGNOSIS — D631 Anemia in chronic kidney disease: Secondary | ICD-10-CM | POA: Diagnosis not present

## 2017-10-26 DIAGNOSIS — N2581 Secondary hyperparathyroidism of renal origin: Secondary | ICD-10-CM | POA: Diagnosis not present

## 2017-10-26 DIAGNOSIS — N186 End stage renal disease: Secondary | ICD-10-CM | POA: Diagnosis not present

## 2017-10-26 DIAGNOSIS — D509 Iron deficiency anemia, unspecified: Secondary | ICD-10-CM | POA: Diagnosis not present

## 2017-10-28 ENCOUNTER — Other Ambulatory Visit: Payer: Self-pay

## 2017-10-28 DIAGNOSIS — N186 End stage renal disease: Secondary | ICD-10-CM

## 2017-10-29 DIAGNOSIS — D631 Anemia in chronic kidney disease: Secondary | ICD-10-CM | POA: Diagnosis not present

## 2017-10-29 DIAGNOSIS — D509 Iron deficiency anemia, unspecified: Secondary | ICD-10-CM | POA: Diagnosis not present

## 2017-10-29 DIAGNOSIS — N2581 Secondary hyperparathyroidism of renal origin: Secondary | ICD-10-CM | POA: Diagnosis not present

## 2017-10-29 DIAGNOSIS — N186 End stage renal disease: Secondary | ICD-10-CM | POA: Diagnosis not present

## 2017-10-30 DIAGNOSIS — N186 End stage renal disease: Secondary | ICD-10-CM | POA: Diagnosis not present

## 2017-10-30 DIAGNOSIS — I120 Hypertensive chronic kidney disease with stage 5 chronic kidney disease or end stage renal disease: Secondary | ICD-10-CM | POA: Diagnosis not present

## 2017-10-30 DIAGNOSIS — T82511D Breakdown (mechanical) of surgically created arteriovenous shunt, subsequent encounter: Secondary | ICD-10-CM | POA: Diagnosis not present

## 2017-10-30 DIAGNOSIS — Z992 Dependence on renal dialysis: Secondary | ICD-10-CM | POA: Diagnosis not present

## 2017-10-31 DIAGNOSIS — D509 Iron deficiency anemia, unspecified: Secondary | ICD-10-CM | POA: Diagnosis not present

## 2017-10-31 DIAGNOSIS — N186 End stage renal disease: Secondary | ICD-10-CM | POA: Diagnosis not present

## 2017-10-31 DIAGNOSIS — D631 Anemia in chronic kidney disease: Secondary | ICD-10-CM | POA: Diagnosis not present

## 2017-10-31 DIAGNOSIS — N2581 Secondary hyperparathyroidism of renal origin: Secondary | ICD-10-CM | POA: Diagnosis not present

## 2017-11-01 DIAGNOSIS — I10 Essential (primary) hypertension: Secondary | ICD-10-CM | POA: Diagnosis not present

## 2017-11-01 DIAGNOSIS — I739 Peripheral vascular disease, unspecified: Secondary | ICD-10-CM | POA: Diagnosis not present

## 2017-11-01 DIAGNOSIS — K581 Irritable bowel syndrome with constipation: Secondary | ICD-10-CM | POA: Diagnosis not present

## 2017-11-01 DIAGNOSIS — Z992 Dependence on renal dialysis: Secondary | ICD-10-CM | POA: Diagnosis not present

## 2017-11-01 DIAGNOSIS — Z0100 Encounter for examination of eyes and vision without abnormal findings: Secondary | ICD-10-CM | POA: Diagnosis not present

## 2017-11-01 DIAGNOSIS — N529 Male erectile dysfunction, unspecified: Secondary | ICD-10-CM | POA: Diagnosis not present

## 2017-11-01 DIAGNOSIS — Z0110 Encounter for examination of ears and hearing without abnormal findings: Secondary | ICD-10-CM | POA: Diagnosis not present

## 2017-11-01 DIAGNOSIS — H9209 Otalgia, unspecified ear: Secondary | ICD-10-CM | POA: Diagnosis not present

## 2017-11-01 DIAGNOSIS — N186 End stage renal disease: Secondary | ICD-10-CM | POA: Diagnosis not present

## 2017-11-01 DIAGNOSIS — N400 Enlarged prostate without lower urinary tract symptoms: Secondary | ICD-10-CM | POA: Diagnosis not present

## 2017-11-01 DIAGNOSIS — D638 Anemia in other chronic diseases classified elsewhere: Secondary | ICD-10-CM | POA: Diagnosis not present

## 2017-11-01 DIAGNOSIS — N184 Chronic kidney disease, stage 4 (severe): Secondary | ICD-10-CM | POA: Diagnosis not present

## 2017-11-01 DIAGNOSIS — I120 Hypertensive chronic kidney disease with stage 5 chronic kidney disease or end stage renal disease: Secondary | ICD-10-CM | POA: Diagnosis not present

## 2017-11-01 DIAGNOSIS — T82511D Breakdown (mechanical) of surgically created arteriovenous shunt, subsequent encounter: Secondary | ICD-10-CM | POA: Diagnosis not present

## 2017-11-02 DIAGNOSIS — N2581 Secondary hyperparathyroidism of renal origin: Secondary | ICD-10-CM | POA: Diagnosis not present

## 2017-11-02 DIAGNOSIS — D631 Anemia in chronic kidney disease: Secondary | ICD-10-CM | POA: Diagnosis not present

## 2017-11-02 DIAGNOSIS — N186 End stage renal disease: Secondary | ICD-10-CM | POA: Diagnosis not present

## 2017-11-02 DIAGNOSIS — D509 Iron deficiency anemia, unspecified: Secondary | ICD-10-CM | POA: Diagnosis not present

## 2017-11-05 DIAGNOSIS — Z992 Dependence on renal dialysis: Secondary | ICD-10-CM | POA: Diagnosis not present

## 2017-11-05 DIAGNOSIS — D509 Iron deficiency anemia, unspecified: Secondary | ICD-10-CM | POA: Diagnosis not present

## 2017-11-05 DIAGNOSIS — I120 Hypertensive chronic kidney disease with stage 5 chronic kidney disease or end stage renal disease: Secondary | ICD-10-CM | POA: Diagnosis not present

## 2017-11-05 DIAGNOSIS — T82511D Breakdown (mechanical) of surgically created arteriovenous shunt, subsequent encounter: Secondary | ICD-10-CM | POA: Diagnosis not present

## 2017-11-05 DIAGNOSIS — N2581 Secondary hyperparathyroidism of renal origin: Secondary | ICD-10-CM | POA: Diagnosis not present

## 2017-11-05 DIAGNOSIS — D631 Anemia in chronic kidney disease: Secondary | ICD-10-CM | POA: Diagnosis not present

## 2017-11-05 DIAGNOSIS — N186 End stage renal disease: Secondary | ICD-10-CM | POA: Diagnosis not present

## 2017-11-07 DIAGNOSIS — N186 End stage renal disease: Secondary | ICD-10-CM | POA: Diagnosis not present

## 2017-11-07 DIAGNOSIS — D509 Iron deficiency anemia, unspecified: Secondary | ICD-10-CM | POA: Diagnosis not present

## 2017-11-07 DIAGNOSIS — D631 Anemia in chronic kidney disease: Secondary | ICD-10-CM | POA: Diagnosis not present

## 2017-11-07 DIAGNOSIS — N2581 Secondary hyperparathyroidism of renal origin: Secondary | ICD-10-CM | POA: Diagnosis not present

## 2017-11-08 DIAGNOSIS — T82511D Breakdown (mechanical) of surgically created arteriovenous shunt, subsequent encounter: Secondary | ICD-10-CM | POA: Diagnosis not present

## 2017-11-08 DIAGNOSIS — H2513 Age-related nuclear cataract, bilateral: Secondary | ICD-10-CM | POA: Diagnosis not present

## 2017-11-08 DIAGNOSIS — N186 End stage renal disease: Secondary | ICD-10-CM | POA: Diagnosis not present

## 2017-11-08 DIAGNOSIS — Z992 Dependence on renal dialysis: Secondary | ICD-10-CM | POA: Diagnosis not present

## 2017-11-08 DIAGNOSIS — I120 Hypertensive chronic kidney disease with stage 5 chronic kidney disease or end stage renal disease: Secondary | ICD-10-CM | POA: Diagnosis not present

## 2017-11-09 DIAGNOSIS — N2581 Secondary hyperparathyroidism of renal origin: Secondary | ICD-10-CM | POA: Diagnosis not present

## 2017-11-09 DIAGNOSIS — D509 Iron deficiency anemia, unspecified: Secondary | ICD-10-CM | POA: Diagnosis not present

## 2017-11-09 DIAGNOSIS — D631 Anemia in chronic kidney disease: Secondary | ICD-10-CM | POA: Diagnosis not present

## 2017-11-09 DIAGNOSIS — N186 End stage renal disease: Secondary | ICD-10-CM | POA: Diagnosis not present

## 2017-11-12 DIAGNOSIS — N2581 Secondary hyperparathyroidism of renal origin: Secondary | ICD-10-CM | POA: Diagnosis not present

## 2017-11-12 DIAGNOSIS — D509 Iron deficiency anemia, unspecified: Secondary | ICD-10-CM | POA: Diagnosis not present

## 2017-11-12 DIAGNOSIS — N186 End stage renal disease: Secondary | ICD-10-CM | POA: Diagnosis not present

## 2017-11-12 DIAGNOSIS — D631 Anemia in chronic kidney disease: Secondary | ICD-10-CM | POA: Diagnosis not present

## 2017-11-13 DIAGNOSIS — Z992 Dependence on renal dialysis: Secondary | ICD-10-CM | POA: Diagnosis not present

## 2017-11-13 DIAGNOSIS — N186 End stage renal disease: Secondary | ICD-10-CM | POA: Diagnosis not present

## 2017-11-13 DIAGNOSIS — T82511D Breakdown (mechanical) of surgically created arteriovenous shunt, subsequent encounter: Secondary | ICD-10-CM | POA: Diagnosis not present

## 2017-11-13 DIAGNOSIS — I120 Hypertensive chronic kidney disease with stage 5 chronic kidney disease or end stage renal disease: Secondary | ICD-10-CM | POA: Diagnosis not present

## 2017-11-14 DIAGNOSIS — D631 Anemia in chronic kidney disease: Secondary | ICD-10-CM | POA: Diagnosis not present

## 2017-11-14 DIAGNOSIS — N186 End stage renal disease: Secondary | ICD-10-CM | POA: Diagnosis not present

## 2017-11-14 DIAGNOSIS — D509 Iron deficiency anemia, unspecified: Secondary | ICD-10-CM | POA: Diagnosis not present

## 2017-11-14 DIAGNOSIS — N2581 Secondary hyperparathyroidism of renal origin: Secondary | ICD-10-CM | POA: Diagnosis not present

## 2017-11-16 DIAGNOSIS — N2581 Secondary hyperparathyroidism of renal origin: Secondary | ICD-10-CM | POA: Diagnosis not present

## 2017-11-16 DIAGNOSIS — N186 End stage renal disease: Secondary | ICD-10-CM | POA: Diagnosis not present

## 2017-11-16 DIAGNOSIS — D631 Anemia in chronic kidney disease: Secondary | ICD-10-CM | POA: Diagnosis not present

## 2017-11-16 DIAGNOSIS — D509 Iron deficiency anemia, unspecified: Secondary | ICD-10-CM | POA: Diagnosis not present

## 2017-11-19 DIAGNOSIS — D509 Iron deficiency anemia, unspecified: Secondary | ICD-10-CM | POA: Diagnosis not present

## 2017-11-19 DIAGNOSIS — D631 Anemia in chronic kidney disease: Secondary | ICD-10-CM | POA: Diagnosis not present

## 2017-11-19 DIAGNOSIS — N2581 Secondary hyperparathyroidism of renal origin: Secondary | ICD-10-CM | POA: Diagnosis not present

## 2017-11-19 DIAGNOSIS — N186 End stage renal disease: Secondary | ICD-10-CM | POA: Diagnosis not present

## 2017-11-20 DIAGNOSIS — I10 Essential (primary) hypertension: Secondary | ICD-10-CM | POA: Diagnosis not present

## 2017-11-20 DIAGNOSIS — I120 Hypertensive chronic kidney disease with stage 5 chronic kidney disease or end stage renal disease: Secondary | ICD-10-CM | POA: Diagnosis not present

## 2017-11-20 DIAGNOSIS — Z992 Dependence on renal dialysis: Secondary | ICD-10-CM | POA: Diagnosis not present

## 2017-11-20 DIAGNOSIS — N186 End stage renal disease: Secondary | ICD-10-CM | POA: Diagnosis not present

## 2017-11-20 DIAGNOSIS — N184 Chronic kidney disease, stage 4 (severe): Secondary | ICD-10-CM | POA: Diagnosis not present

## 2017-11-21 DIAGNOSIS — D509 Iron deficiency anemia, unspecified: Secondary | ICD-10-CM | POA: Diagnosis not present

## 2017-11-21 DIAGNOSIS — N2581 Secondary hyperparathyroidism of renal origin: Secondary | ICD-10-CM | POA: Diagnosis not present

## 2017-11-21 DIAGNOSIS — N186 End stage renal disease: Secondary | ICD-10-CM | POA: Diagnosis not present

## 2017-11-23 DIAGNOSIS — N2581 Secondary hyperparathyroidism of renal origin: Secondary | ICD-10-CM | POA: Diagnosis not present

## 2017-11-23 DIAGNOSIS — D509 Iron deficiency anemia, unspecified: Secondary | ICD-10-CM | POA: Diagnosis not present

## 2017-11-23 DIAGNOSIS — N186 End stage renal disease: Secondary | ICD-10-CM | POA: Diagnosis not present

## 2017-11-26 DIAGNOSIS — N2581 Secondary hyperparathyroidism of renal origin: Secondary | ICD-10-CM | POA: Diagnosis not present

## 2017-11-26 DIAGNOSIS — N186 End stage renal disease: Secondary | ICD-10-CM | POA: Diagnosis not present

## 2017-11-26 DIAGNOSIS — D509 Iron deficiency anemia, unspecified: Secondary | ICD-10-CM | POA: Diagnosis not present

## 2017-11-27 ENCOUNTER — Ambulatory Visit (INDEPENDENT_AMBULATORY_CARE_PROVIDER_SITE_OTHER): Payer: Self-pay | Admitting: Physician Assistant

## 2017-11-27 ENCOUNTER — Other Ambulatory Visit: Payer: Self-pay

## 2017-11-27 ENCOUNTER — Ambulatory Visit (HOSPITAL_COMMUNITY)
Admission: RE | Admit: 2017-11-27 | Discharge: 2017-11-27 | Disposition: A | Discharge status: Home / Self Care | Payer: Medicare Other | Source: Ambulatory Visit | Attending: Vascular Surgery | Admitting: Vascular Surgery

## 2017-11-27 VITALS — BP 146/89 | HR 60 | Temp 97.2°F | Resp 20 | Ht 70.0 in | Wt 200.0 lb

## 2017-11-27 DIAGNOSIS — Z48812 Encounter for surgical aftercare following surgery on the circulatory system: Secondary | ICD-10-CM | POA: Diagnosis not present

## 2017-11-27 DIAGNOSIS — N186 End stage renal disease: Secondary | ICD-10-CM

## 2017-11-27 DIAGNOSIS — Z992 Dependence on renal dialysis: Secondary | ICD-10-CM | POA: Diagnosis not present

## 2017-11-27 NOTE — Progress Notes (Signed)
  POST OPERATIVE OFFICE NOTE    CC:  F/u for surgery  HPI:  This is a 67 y.o. male who is s/p Right Radial Cephalic AV fistula 02/26/3816 by Dr. Oneida Alar.  He is here today for f/u visit and fistula duplex.  He denise weakness, numbness and pain in the right UE.  He denise fever or chills.  He is currently on HD via [facility].      No Known Allergies  Current Outpatient Medications  Medication Sig Dispense Refill  . acetaminophen (TYLENOL) 500 MG tablet Take 1,000 mg by mouth every 6 (six) hours as needed for mild pain or headache.     . AMITIZA 24 MCG capsule Take 24 mcg by mouth 2 (two) times daily with a meal.     . b complex-vitamin c-folic acid (NEPHRO-VITE) 0.8 MG TABS tablet Take 1 tablet by mouth daily.    Marland Kitchen ibuprofen (ADVIL,MOTRIN) 200 MG tablet Take 200 mg by mouth every 6 (six) hours as needed for headache or moderate pain.     No current facility-administered medications for this visit.      ROS:  See HPI  Physical Exam:  Vitals:   11/27/17 1435  BP: (!) 146/89  Pulse: 60  Resp: 20  Temp: (!) 97.2 F (36.2 C)  SpO2: 99%    Incision:  Well healed wihtout erythema or edema. Extremities:  Grip 5/5, sensation intact equal B UE.  Excellent palpable thrill through out the fistula with good visible size under the skin. Heart RRR Lungs non labored breathing  Fistula duplex: Diameter 0.46-0.56, depth< 0.23 cm  Assessment/Plan:  This is a 67 y.o. male who is s/p:Right Radial Cephalic AV fistula 01/21/1656.  The fistula is now 84 weeks old.  It is maturing well with excellent palpable thrill.  The fistula may be used as of July 1 st 2019.  Once it is running well he will be able to discontinue his TDC.  F/U PRN.     Roxy Horseman , PA-C [facility] [phone]  Clinic MD:  Bridgett Larsson

## 2017-11-28 DIAGNOSIS — N2581 Secondary hyperparathyroidism of renal origin: Secondary | ICD-10-CM | POA: Diagnosis not present

## 2017-11-28 DIAGNOSIS — D509 Iron deficiency anemia, unspecified: Secondary | ICD-10-CM | POA: Diagnosis not present

## 2017-11-28 DIAGNOSIS — N186 End stage renal disease: Secondary | ICD-10-CM | POA: Diagnosis not present

## 2017-11-30 DIAGNOSIS — D509 Iron deficiency anemia, unspecified: Secondary | ICD-10-CM | POA: Diagnosis not present

## 2017-11-30 DIAGNOSIS — N186 End stage renal disease: Secondary | ICD-10-CM | POA: Diagnosis not present

## 2017-11-30 DIAGNOSIS — N2581 Secondary hyperparathyroidism of renal origin: Secondary | ICD-10-CM | POA: Diagnosis not present

## 2017-12-03 DIAGNOSIS — N2581 Secondary hyperparathyroidism of renal origin: Secondary | ICD-10-CM | POA: Diagnosis not present

## 2017-12-03 DIAGNOSIS — N186 End stage renal disease: Secondary | ICD-10-CM | POA: Diagnosis not present

## 2017-12-03 DIAGNOSIS — D509 Iron deficiency anemia, unspecified: Secondary | ICD-10-CM | POA: Diagnosis not present

## 2017-12-05 DIAGNOSIS — D509 Iron deficiency anemia, unspecified: Secondary | ICD-10-CM | POA: Diagnosis not present

## 2017-12-05 DIAGNOSIS — N186 End stage renal disease: Secondary | ICD-10-CM | POA: Diagnosis not present

## 2017-12-05 DIAGNOSIS — N2581 Secondary hyperparathyroidism of renal origin: Secondary | ICD-10-CM | POA: Diagnosis not present

## 2017-12-07 DIAGNOSIS — N186 End stage renal disease: Secondary | ICD-10-CM | POA: Diagnosis not present

## 2017-12-07 DIAGNOSIS — D509 Iron deficiency anemia, unspecified: Secondary | ICD-10-CM | POA: Diagnosis not present

## 2017-12-07 DIAGNOSIS — N2581 Secondary hyperparathyroidism of renal origin: Secondary | ICD-10-CM | POA: Diagnosis not present

## 2017-12-10 DIAGNOSIS — D509 Iron deficiency anemia, unspecified: Secondary | ICD-10-CM | POA: Diagnosis not present

## 2017-12-10 DIAGNOSIS — N186 End stage renal disease: Secondary | ICD-10-CM | POA: Diagnosis not present

## 2017-12-10 DIAGNOSIS — N2581 Secondary hyperparathyroidism of renal origin: Secondary | ICD-10-CM | POA: Diagnosis not present

## 2017-12-11 DIAGNOSIS — N184 Chronic kidney disease, stage 4 (severe): Secondary | ICD-10-CM | POA: Diagnosis not present

## 2017-12-11 DIAGNOSIS — N529 Male erectile dysfunction, unspecified: Secondary | ICD-10-CM | POA: Diagnosis not present

## 2017-12-12 DIAGNOSIS — D509 Iron deficiency anemia, unspecified: Secondary | ICD-10-CM | POA: Diagnosis not present

## 2017-12-12 DIAGNOSIS — N186 End stage renal disease: Secondary | ICD-10-CM | POA: Diagnosis not present

## 2017-12-12 DIAGNOSIS — N2581 Secondary hyperparathyroidism of renal origin: Secondary | ICD-10-CM | POA: Diagnosis not present

## 2017-12-14 DIAGNOSIS — N2581 Secondary hyperparathyroidism of renal origin: Secondary | ICD-10-CM | POA: Diagnosis not present

## 2017-12-14 DIAGNOSIS — D509 Iron deficiency anemia, unspecified: Secondary | ICD-10-CM | POA: Diagnosis not present

## 2017-12-14 DIAGNOSIS — N186 End stage renal disease: Secondary | ICD-10-CM | POA: Diagnosis not present

## 2017-12-17 DIAGNOSIS — D509 Iron deficiency anemia, unspecified: Secondary | ICD-10-CM | POA: Diagnosis not present

## 2017-12-17 DIAGNOSIS — N2581 Secondary hyperparathyroidism of renal origin: Secondary | ICD-10-CM | POA: Diagnosis not present

## 2017-12-17 DIAGNOSIS — N186 End stage renal disease: Secondary | ICD-10-CM | POA: Diagnosis not present

## 2017-12-19 DIAGNOSIS — N186 End stage renal disease: Secondary | ICD-10-CM | POA: Diagnosis not present

## 2017-12-19 DIAGNOSIS — N2581 Secondary hyperparathyroidism of renal origin: Secondary | ICD-10-CM | POA: Diagnosis not present

## 2017-12-19 DIAGNOSIS — D509 Iron deficiency anemia, unspecified: Secondary | ICD-10-CM | POA: Diagnosis not present

## 2017-12-21 DIAGNOSIS — I120 Hypertensive chronic kidney disease with stage 5 chronic kidney disease or end stage renal disease: Secondary | ICD-10-CM | POA: Diagnosis not present

## 2017-12-21 DIAGNOSIS — Z992 Dependence on renal dialysis: Secondary | ICD-10-CM | POA: Diagnosis not present

## 2017-12-21 DIAGNOSIS — N2581 Secondary hyperparathyroidism of renal origin: Secondary | ICD-10-CM | POA: Diagnosis not present

## 2017-12-21 DIAGNOSIS — D509 Iron deficiency anemia, unspecified: Secondary | ICD-10-CM | POA: Diagnosis not present

## 2017-12-21 DIAGNOSIS — D631 Anemia in chronic kidney disease: Secondary | ICD-10-CM | POA: Diagnosis not present

## 2017-12-21 DIAGNOSIS — N186 End stage renal disease: Secondary | ICD-10-CM | POA: Diagnosis not present

## 2017-12-24 DIAGNOSIS — N186 End stage renal disease: Secondary | ICD-10-CM | POA: Diagnosis not present

## 2017-12-24 DIAGNOSIS — D509 Iron deficiency anemia, unspecified: Secondary | ICD-10-CM | POA: Diagnosis not present

## 2017-12-24 DIAGNOSIS — N2581 Secondary hyperparathyroidism of renal origin: Secondary | ICD-10-CM | POA: Diagnosis not present

## 2017-12-24 DIAGNOSIS — D631 Anemia in chronic kidney disease: Secondary | ICD-10-CM | POA: Diagnosis not present

## 2017-12-26 DIAGNOSIS — D509 Iron deficiency anemia, unspecified: Secondary | ICD-10-CM | POA: Diagnosis not present

## 2017-12-26 DIAGNOSIS — N186 End stage renal disease: Secondary | ICD-10-CM | POA: Diagnosis not present

## 2017-12-26 DIAGNOSIS — D631 Anemia in chronic kidney disease: Secondary | ICD-10-CM | POA: Diagnosis not present

## 2017-12-26 DIAGNOSIS — N2581 Secondary hyperparathyroidism of renal origin: Secondary | ICD-10-CM | POA: Diagnosis not present

## 2017-12-28 DIAGNOSIS — D509 Iron deficiency anemia, unspecified: Secondary | ICD-10-CM | POA: Diagnosis not present

## 2017-12-28 DIAGNOSIS — N186 End stage renal disease: Secondary | ICD-10-CM | POA: Diagnosis not present

## 2017-12-28 DIAGNOSIS — D631 Anemia in chronic kidney disease: Secondary | ICD-10-CM | POA: Diagnosis not present

## 2017-12-28 DIAGNOSIS — N2581 Secondary hyperparathyroidism of renal origin: Secondary | ICD-10-CM | POA: Diagnosis not present

## 2017-12-31 DIAGNOSIS — D631 Anemia in chronic kidney disease: Secondary | ICD-10-CM | POA: Diagnosis not present

## 2017-12-31 DIAGNOSIS — N2581 Secondary hyperparathyroidism of renal origin: Secondary | ICD-10-CM | POA: Diagnosis not present

## 2017-12-31 DIAGNOSIS — D509 Iron deficiency anemia, unspecified: Secondary | ICD-10-CM | POA: Diagnosis not present

## 2017-12-31 DIAGNOSIS — N186 End stage renal disease: Secondary | ICD-10-CM | POA: Diagnosis not present

## 2018-01-02 DIAGNOSIS — D631 Anemia in chronic kidney disease: Secondary | ICD-10-CM | POA: Diagnosis not present

## 2018-01-02 DIAGNOSIS — D509 Iron deficiency anemia, unspecified: Secondary | ICD-10-CM | POA: Diagnosis not present

## 2018-01-02 DIAGNOSIS — N186 End stage renal disease: Secondary | ICD-10-CM | POA: Diagnosis not present

## 2018-01-02 DIAGNOSIS — N2581 Secondary hyperparathyroidism of renal origin: Secondary | ICD-10-CM | POA: Diagnosis not present

## 2018-01-04 DIAGNOSIS — N186 End stage renal disease: Secondary | ICD-10-CM | POA: Diagnosis not present

## 2018-01-04 DIAGNOSIS — D509 Iron deficiency anemia, unspecified: Secondary | ICD-10-CM | POA: Diagnosis not present

## 2018-01-04 DIAGNOSIS — N2581 Secondary hyperparathyroidism of renal origin: Secondary | ICD-10-CM | POA: Diagnosis not present

## 2018-01-04 DIAGNOSIS — D631 Anemia in chronic kidney disease: Secondary | ICD-10-CM | POA: Diagnosis not present

## 2018-01-07 DIAGNOSIS — N186 End stage renal disease: Secondary | ICD-10-CM | POA: Diagnosis not present

## 2018-01-07 DIAGNOSIS — D631 Anemia in chronic kidney disease: Secondary | ICD-10-CM | POA: Diagnosis not present

## 2018-01-07 DIAGNOSIS — D509 Iron deficiency anemia, unspecified: Secondary | ICD-10-CM | POA: Diagnosis not present

## 2018-01-07 DIAGNOSIS — N2581 Secondary hyperparathyroidism of renal origin: Secondary | ICD-10-CM | POA: Diagnosis not present

## 2018-01-09 DIAGNOSIS — D631 Anemia in chronic kidney disease: Secondary | ICD-10-CM | POA: Diagnosis not present

## 2018-01-09 DIAGNOSIS — N186 End stage renal disease: Secondary | ICD-10-CM | POA: Diagnosis not present

## 2018-01-09 DIAGNOSIS — D509 Iron deficiency anemia, unspecified: Secondary | ICD-10-CM | POA: Diagnosis not present

## 2018-01-09 DIAGNOSIS — N2581 Secondary hyperparathyroidism of renal origin: Secondary | ICD-10-CM | POA: Diagnosis not present

## 2018-01-11 DIAGNOSIS — N2581 Secondary hyperparathyroidism of renal origin: Secondary | ICD-10-CM | POA: Diagnosis not present

## 2018-01-11 DIAGNOSIS — N186 End stage renal disease: Secondary | ICD-10-CM | POA: Diagnosis not present

## 2018-01-11 DIAGNOSIS — D509 Iron deficiency anemia, unspecified: Secondary | ICD-10-CM | POA: Diagnosis not present

## 2018-01-11 DIAGNOSIS — D631 Anemia in chronic kidney disease: Secondary | ICD-10-CM | POA: Diagnosis not present

## 2018-01-14 DIAGNOSIS — D509 Iron deficiency anemia, unspecified: Secondary | ICD-10-CM | POA: Diagnosis not present

## 2018-01-14 DIAGNOSIS — N186 End stage renal disease: Secondary | ICD-10-CM | POA: Diagnosis not present

## 2018-01-14 DIAGNOSIS — N2581 Secondary hyperparathyroidism of renal origin: Secondary | ICD-10-CM | POA: Diagnosis not present

## 2018-01-14 DIAGNOSIS — D631 Anemia in chronic kidney disease: Secondary | ICD-10-CM | POA: Diagnosis not present

## 2018-01-16 DIAGNOSIS — N2581 Secondary hyperparathyroidism of renal origin: Secondary | ICD-10-CM | POA: Diagnosis not present

## 2018-01-16 DIAGNOSIS — D509 Iron deficiency anemia, unspecified: Secondary | ICD-10-CM | POA: Diagnosis not present

## 2018-01-16 DIAGNOSIS — D631 Anemia in chronic kidney disease: Secondary | ICD-10-CM | POA: Diagnosis not present

## 2018-01-16 DIAGNOSIS — N186 End stage renal disease: Secondary | ICD-10-CM | POA: Diagnosis not present

## 2018-01-18 DIAGNOSIS — D631 Anemia in chronic kidney disease: Secondary | ICD-10-CM | POA: Diagnosis not present

## 2018-01-18 DIAGNOSIS — N186 End stage renal disease: Secondary | ICD-10-CM | POA: Diagnosis not present

## 2018-01-18 DIAGNOSIS — D509 Iron deficiency anemia, unspecified: Secondary | ICD-10-CM | POA: Diagnosis not present

## 2018-01-18 DIAGNOSIS — N2581 Secondary hyperparathyroidism of renal origin: Secondary | ICD-10-CM | POA: Diagnosis not present

## 2018-01-20 DIAGNOSIS — I120 Hypertensive chronic kidney disease with stage 5 chronic kidney disease or end stage renal disease: Secondary | ICD-10-CM | POA: Diagnosis not present

## 2018-01-20 DIAGNOSIS — Z992 Dependence on renal dialysis: Secondary | ICD-10-CM | POA: Diagnosis not present

## 2018-01-20 DIAGNOSIS — N186 End stage renal disease: Secondary | ICD-10-CM | POA: Diagnosis not present

## 2018-01-21 DIAGNOSIS — N186 End stage renal disease: Secondary | ICD-10-CM | POA: Diagnosis not present

## 2018-01-21 DIAGNOSIS — D509 Iron deficiency anemia, unspecified: Secondary | ICD-10-CM | POA: Diagnosis not present

## 2018-01-21 DIAGNOSIS — N2581 Secondary hyperparathyroidism of renal origin: Secondary | ICD-10-CM | POA: Diagnosis not present

## 2018-01-21 DIAGNOSIS — D631 Anemia in chronic kidney disease: Secondary | ICD-10-CM | POA: Diagnosis not present

## 2018-01-23 DIAGNOSIS — N2581 Secondary hyperparathyroidism of renal origin: Secondary | ICD-10-CM | POA: Diagnosis not present

## 2018-01-23 DIAGNOSIS — D509 Iron deficiency anemia, unspecified: Secondary | ICD-10-CM | POA: Diagnosis not present

## 2018-01-23 DIAGNOSIS — N186 End stage renal disease: Secondary | ICD-10-CM | POA: Diagnosis not present

## 2018-01-23 DIAGNOSIS — D631 Anemia in chronic kidney disease: Secondary | ICD-10-CM | POA: Diagnosis not present

## 2018-01-25 DIAGNOSIS — N186 End stage renal disease: Secondary | ICD-10-CM | POA: Diagnosis not present

## 2018-01-25 DIAGNOSIS — D631 Anemia in chronic kidney disease: Secondary | ICD-10-CM | POA: Diagnosis not present

## 2018-01-25 DIAGNOSIS — N2581 Secondary hyperparathyroidism of renal origin: Secondary | ICD-10-CM | POA: Diagnosis not present

## 2018-01-25 DIAGNOSIS — D509 Iron deficiency anemia, unspecified: Secondary | ICD-10-CM | POA: Diagnosis not present

## 2018-01-28 DIAGNOSIS — D509 Iron deficiency anemia, unspecified: Secondary | ICD-10-CM | POA: Diagnosis not present

## 2018-01-28 DIAGNOSIS — N186 End stage renal disease: Secondary | ICD-10-CM | POA: Diagnosis not present

## 2018-01-28 DIAGNOSIS — N2581 Secondary hyperparathyroidism of renal origin: Secondary | ICD-10-CM | POA: Diagnosis not present

## 2018-01-28 DIAGNOSIS — D631 Anemia in chronic kidney disease: Secondary | ICD-10-CM | POA: Diagnosis not present

## 2018-01-30 DIAGNOSIS — D631 Anemia in chronic kidney disease: Secondary | ICD-10-CM | POA: Diagnosis not present

## 2018-01-30 DIAGNOSIS — N186 End stage renal disease: Secondary | ICD-10-CM | POA: Diagnosis not present

## 2018-01-30 DIAGNOSIS — D509 Iron deficiency anemia, unspecified: Secondary | ICD-10-CM | POA: Diagnosis not present

## 2018-01-30 DIAGNOSIS — N2581 Secondary hyperparathyroidism of renal origin: Secondary | ICD-10-CM | POA: Diagnosis not present

## 2018-02-01 DIAGNOSIS — N186 End stage renal disease: Secondary | ICD-10-CM | POA: Diagnosis not present

## 2018-02-01 DIAGNOSIS — D631 Anemia in chronic kidney disease: Secondary | ICD-10-CM | POA: Diagnosis not present

## 2018-02-01 DIAGNOSIS — N2581 Secondary hyperparathyroidism of renal origin: Secondary | ICD-10-CM | POA: Diagnosis not present

## 2018-02-01 DIAGNOSIS — D509 Iron deficiency anemia, unspecified: Secondary | ICD-10-CM | POA: Diagnosis not present

## 2018-02-04 DIAGNOSIS — D509 Iron deficiency anemia, unspecified: Secondary | ICD-10-CM | POA: Diagnosis not present

## 2018-02-04 DIAGNOSIS — N186 End stage renal disease: Secondary | ICD-10-CM | POA: Diagnosis not present

## 2018-02-04 DIAGNOSIS — D631 Anemia in chronic kidney disease: Secondary | ICD-10-CM | POA: Diagnosis not present

## 2018-02-04 DIAGNOSIS — N2581 Secondary hyperparathyroidism of renal origin: Secondary | ICD-10-CM | POA: Diagnosis not present

## 2018-02-06 DIAGNOSIS — N2581 Secondary hyperparathyroidism of renal origin: Secondary | ICD-10-CM | POA: Diagnosis not present

## 2018-02-06 DIAGNOSIS — D509 Iron deficiency anemia, unspecified: Secondary | ICD-10-CM | POA: Diagnosis not present

## 2018-02-06 DIAGNOSIS — D631 Anemia in chronic kidney disease: Secondary | ICD-10-CM | POA: Diagnosis not present

## 2018-02-06 DIAGNOSIS — N186 End stage renal disease: Secondary | ICD-10-CM | POA: Diagnosis not present

## 2018-02-08 DIAGNOSIS — D509 Iron deficiency anemia, unspecified: Secondary | ICD-10-CM | POA: Diagnosis not present

## 2018-02-08 DIAGNOSIS — D631 Anemia in chronic kidney disease: Secondary | ICD-10-CM | POA: Diagnosis not present

## 2018-02-08 DIAGNOSIS — N2581 Secondary hyperparathyroidism of renal origin: Secondary | ICD-10-CM | POA: Diagnosis not present

## 2018-02-08 DIAGNOSIS — N186 End stage renal disease: Secondary | ICD-10-CM | POA: Diagnosis not present

## 2018-02-11 DIAGNOSIS — N2581 Secondary hyperparathyroidism of renal origin: Secondary | ICD-10-CM | POA: Diagnosis not present

## 2018-02-11 DIAGNOSIS — D509 Iron deficiency anemia, unspecified: Secondary | ICD-10-CM | POA: Diagnosis not present

## 2018-02-11 DIAGNOSIS — N186 End stage renal disease: Secondary | ICD-10-CM | POA: Diagnosis not present

## 2018-02-11 DIAGNOSIS — D631 Anemia in chronic kidney disease: Secondary | ICD-10-CM | POA: Diagnosis not present

## 2018-02-13 DIAGNOSIS — D631 Anemia in chronic kidney disease: Secondary | ICD-10-CM | POA: Diagnosis not present

## 2018-02-13 DIAGNOSIS — N2581 Secondary hyperparathyroidism of renal origin: Secondary | ICD-10-CM | POA: Diagnosis not present

## 2018-02-13 DIAGNOSIS — N186 End stage renal disease: Secondary | ICD-10-CM | POA: Diagnosis not present

## 2018-02-13 DIAGNOSIS — D509 Iron deficiency anemia, unspecified: Secondary | ICD-10-CM | POA: Diagnosis not present

## 2018-02-15 DIAGNOSIS — N186 End stage renal disease: Secondary | ICD-10-CM | POA: Diagnosis not present

## 2018-02-15 DIAGNOSIS — N2581 Secondary hyperparathyroidism of renal origin: Secondary | ICD-10-CM | POA: Diagnosis not present

## 2018-02-15 DIAGNOSIS — D631 Anemia in chronic kidney disease: Secondary | ICD-10-CM | POA: Diagnosis not present

## 2018-02-15 DIAGNOSIS — D509 Iron deficiency anemia, unspecified: Secondary | ICD-10-CM | POA: Diagnosis not present

## 2018-02-18 DIAGNOSIS — N186 End stage renal disease: Secondary | ICD-10-CM | POA: Diagnosis not present

## 2018-02-18 DIAGNOSIS — D631 Anemia in chronic kidney disease: Secondary | ICD-10-CM | POA: Diagnosis not present

## 2018-02-18 DIAGNOSIS — N2581 Secondary hyperparathyroidism of renal origin: Secondary | ICD-10-CM | POA: Diagnosis not present

## 2018-02-18 DIAGNOSIS — D509 Iron deficiency anemia, unspecified: Secondary | ICD-10-CM | POA: Diagnosis not present

## 2018-02-20 DIAGNOSIS — N2581 Secondary hyperparathyroidism of renal origin: Secondary | ICD-10-CM | POA: Diagnosis not present

## 2018-02-20 DIAGNOSIS — Z992 Dependence on renal dialysis: Secondary | ICD-10-CM | POA: Diagnosis not present

## 2018-02-20 DIAGNOSIS — D509 Iron deficiency anemia, unspecified: Secondary | ICD-10-CM | POA: Diagnosis not present

## 2018-02-20 DIAGNOSIS — I120 Hypertensive chronic kidney disease with stage 5 chronic kidney disease or end stage renal disease: Secondary | ICD-10-CM | POA: Diagnosis not present

## 2018-02-20 DIAGNOSIS — C61 Malignant neoplasm of prostate: Secondary | ICD-10-CM | POA: Diagnosis not present

## 2018-02-20 DIAGNOSIS — N186 End stage renal disease: Secondary | ICD-10-CM | POA: Diagnosis not present

## 2018-02-20 DIAGNOSIS — D631 Anemia in chronic kidney disease: Secondary | ICD-10-CM | POA: Diagnosis not present

## 2018-02-22 DIAGNOSIS — D631 Anemia in chronic kidney disease: Secondary | ICD-10-CM | POA: Diagnosis not present

## 2018-02-22 DIAGNOSIS — D509 Iron deficiency anemia, unspecified: Secondary | ICD-10-CM | POA: Diagnosis not present

## 2018-02-22 DIAGNOSIS — N2581 Secondary hyperparathyroidism of renal origin: Secondary | ICD-10-CM | POA: Diagnosis not present

## 2018-02-22 DIAGNOSIS — N186 End stage renal disease: Secondary | ICD-10-CM | POA: Diagnosis not present

## 2018-02-25 DIAGNOSIS — N186 End stage renal disease: Secondary | ICD-10-CM | POA: Diagnosis not present

## 2018-02-25 DIAGNOSIS — D509 Iron deficiency anemia, unspecified: Secondary | ICD-10-CM | POA: Diagnosis not present

## 2018-02-25 DIAGNOSIS — N2581 Secondary hyperparathyroidism of renal origin: Secondary | ICD-10-CM | POA: Diagnosis not present

## 2018-02-25 DIAGNOSIS — D631 Anemia in chronic kidney disease: Secondary | ICD-10-CM | POA: Diagnosis not present

## 2018-02-27 DIAGNOSIS — D631 Anemia in chronic kidney disease: Secondary | ICD-10-CM | POA: Diagnosis not present

## 2018-02-27 DIAGNOSIS — D509 Iron deficiency anemia, unspecified: Secondary | ICD-10-CM | POA: Diagnosis not present

## 2018-02-27 DIAGNOSIS — N186 End stage renal disease: Secondary | ICD-10-CM | POA: Diagnosis not present

## 2018-02-27 DIAGNOSIS — N2581 Secondary hyperparathyroidism of renal origin: Secondary | ICD-10-CM | POA: Diagnosis not present

## 2018-02-28 DIAGNOSIS — N186 End stage renal disease: Secondary | ICD-10-CM | POA: Diagnosis not present

## 2018-02-28 DIAGNOSIS — I771 Stricture of artery: Secondary | ICD-10-CM | POA: Diagnosis not present

## 2018-02-28 DIAGNOSIS — Z992 Dependence on renal dialysis: Secondary | ICD-10-CM | POA: Diagnosis not present

## 2018-03-01 DIAGNOSIS — D631 Anemia in chronic kidney disease: Secondary | ICD-10-CM | POA: Diagnosis not present

## 2018-03-01 DIAGNOSIS — N2581 Secondary hyperparathyroidism of renal origin: Secondary | ICD-10-CM | POA: Diagnosis not present

## 2018-03-01 DIAGNOSIS — D509 Iron deficiency anemia, unspecified: Secondary | ICD-10-CM | POA: Diagnosis not present

## 2018-03-01 DIAGNOSIS — N186 End stage renal disease: Secondary | ICD-10-CM | POA: Diagnosis not present

## 2018-03-04 DIAGNOSIS — D509 Iron deficiency anemia, unspecified: Secondary | ICD-10-CM | POA: Diagnosis not present

## 2018-03-04 DIAGNOSIS — D631 Anemia in chronic kidney disease: Secondary | ICD-10-CM | POA: Diagnosis not present

## 2018-03-04 DIAGNOSIS — N2581 Secondary hyperparathyroidism of renal origin: Secondary | ICD-10-CM | POA: Diagnosis not present

## 2018-03-04 DIAGNOSIS — N186 End stage renal disease: Secondary | ICD-10-CM | POA: Diagnosis not present

## 2018-03-06 DIAGNOSIS — N186 End stage renal disease: Secondary | ICD-10-CM | POA: Diagnosis not present

## 2018-03-06 DIAGNOSIS — N2581 Secondary hyperparathyroidism of renal origin: Secondary | ICD-10-CM | POA: Diagnosis not present

## 2018-03-06 DIAGNOSIS — D509 Iron deficiency anemia, unspecified: Secondary | ICD-10-CM | POA: Diagnosis not present

## 2018-03-06 DIAGNOSIS — D631 Anemia in chronic kidney disease: Secondary | ICD-10-CM | POA: Diagnosis not present

## 2018-03-08 DIAGNOSIS — D509 Iron deficiency anemia, unspecified: Secondary | ICD-10-CM | POA: Diagnosis not present

## 2018-03-08 DIAGNOSIS — D631 Anemia in chronic kidney disease: Secondary | ICD-10-CM | POA: Diagnosis not present

## 2018-03-08 DIAGNOSIS — N2581 Secondary hyperparathyroidism of renal origin: Secondary | ICD-10-CM | POA: Diagnosis not present

## 2018-03-08 DIAGNOSIS — N186 End stage renal disease: Secondary | ICD-10-CM | POA: Diagnosis not present

## 2018-03-11 DIAGNOSIS — N186 End stage renal disease: Secondary | ICD-10-CM | POA: Diagnosis not present

## 2018-03-11 DIAGNOSIS — D509 Iron deficiency anemia, unspecified: Secondary | ICD-10-CM | POA: Diagnosis not present

## 2018-03-11 DIAGNOSIS — D631 Anemia in chronic kidney disease: Secondary | ICD-10-CM | POA: Diagnosis not present

## 2018-03-11 DIAGNOSIS — N2581 Secondary hyperparathyroidism of renal origin: Secondary | ICD-10-CM | POA: Diagnosis not present

## 2018-03-13 DIAGNOSIS — N2581 Secondary hyperparathyroidism of renal origin: Secondary | ICD-10-CM | POA: Diagnosis not present

## 2018-03-13 DIAGNOSIS — D631 Anemia in chronic kidney disease: Secondary | ICD-10-CM | POA: Diagnosis not present

## 2018-03-13 DIAGNOSIS — D509 Iron deficiency anemia, unspecified: Secondary | ICD-10-CM | POA: Diagnosis not present

## 2018-03-13 DIAGNOSIS — N186 End stage renal disease: Secondary | ICD-10-CM | POA: Diagnosis not present

## 2018-03-15 DIAGNOSIS — N186 End stage renal disease: Secondary | ICD-10-CM | POA: Diagnosis not present

## 2018-03-15 DIAGNOSIS — D509 Iron deficiency anemia, unspecified: Secondary | ICD-10-CM | POA: Diagnosis not present

## 2018-03-15 DIAGNOSIS — N2581 Secondary hyperparathyroidism of renal origin: Secondary | ICD-10-CM | POA: Diagnosis not present

## 2018-03-15 DIAGNOSIS — D631 Anemia in chronic kidney disease: Secondary | ICD-10-CM | POA: Diagnosis not present

## 2018-03-17 DIAGNOSIS — Z452 Encounter for adjustment and management of vascular access device: Secondary | ICD-10-CM | POA: Diagnosis not present

## 2018-03-18 DIAGNOSIS — D509 Iron deficiency anemia, unspecified: Secondary | ICD-10-CM | POA: Diagnosis not present

## 2018-03-18 DIAGNOSIS — N2581 Secondary hyperparathyroidism of renal origin: Secondary | ICD-10-CM | POA: Diagnosis not present

## 2018-03-18 DIAGNOSIS — D631 Anemia in chronic kidney disease: Secondary | ICD-10-CM | POA: Diagnosis not present

## 2018-03-18 DIAGNOSIS — N186 End stage renal disease: Secondary | ICD-10-CM | POA: Diagnosis not present

## 2018-03-20 DIAGNOSIS — D631 Anemia in chronic kidney disease: Secondary | ICD-10-CM | POA: Diagnosis not present

## 2018-03-20 DIAGNOSIS — D509 Iron deficiency anemia, unspecified: Secondary | ICD-10-CM | POA: Diagnosis not present

## 2018-03-20 DIAGNOSIS — N186 End stage renal disease: Secondary | ICD-10-CM | POA: Diagnosis not present

## 2018-03-20 DIAGNOSIS — N2581 Secondary hyperparathyroidism of renal origin: Secondary | ICD-10-CM | POA: Diagnosis not present

## 2018-03-22 DIAGNOSIS — N2581 Secondary hyperparathyroidism of renal origin: Secondary | ICD-10-CM | POA: Diagnosis not present

## 2018-03-22 DIAGNOSIS — D509 Iron deficiency anemia, unspecified: Secondary | ICD-10-CM | POA: Diagnosis not present

## 2018-03-22 DIAGNOSIS — N186 End stage renal disease: Secondary | ICD-10-CM | POA: Diagnosis not present

## 2018-03-22 DIAGNOSIS — D631 Anemia in chronic kidney disease: Secondary | ICD-10-CM | POA: Diagnosis not present

## 2018-03-23 DIAGNOSIS — I120 Hypertensive chronic kidney disease with stage 5 chronic kidney disease or end stage renal disease: Secondary | ICD-10-CM | POA: Diagnosis not present

## 2018-03-23 DIAGNOSIS — N186 End stage renal disease: Secondary | ICD-10-CM | POA: Diagnosis not present

## 2018-03-23 DIAGNOSIS — Z992 Dependence on renal dialysis: Secondary | ICD-10-CM | POA: Diagnosis not present

## 2018-03-25 DIAGNOSIS — N2581 Secondary hyperparathyroidism of renal origin: Secondary | ICD-10-CM | POA: Diagnosis not present

## 2018-03-25 DIAGNOSIS — D631 Anemia in chronic kidney disease: Secondary | ICD-10-CM | POA: Diagnosis not present

## 2018-03-25 DIAGNOSIS — N186 End stage renal disease: Secondary | ICD-10-CM | POA: Diagnosis not present

## 2018-03-25 DIAGNOSIS — D509 Iron deficiency anemia, unspecified: Secondary | ICD-10-CM | POA: Diagnosis not present

## 2018-03-25 DIAGNOSIS — Z23 Encounter for immunization: Secondary | ICD-10-CM | POA: Diagnosis not present

## 2018-03-27 DIAGNOSIS — N186 End stage renal disease: Secondary | ICD-10-CM | POA: Diagnosis not present

## 2018-03-27 DIAGNOSIS — D509 Iron deficiency anemia, unspecified: Secondary | ICD-10-CM | POA: Diagnosis not present

## 2018-03-27 DIAGNOSIS — Z23 Encounter for immunization: Secondary | ICD-10-CM | POA: Diagnosis not present

## 2018-03-27 DIAGNOSIS — N2581 Secondary hyperparathyroidism of renal origin: Secondary | ICD-10-CM | POA: Diagnosis not present

## 2018-03-27 DIAGNOSIS — D631 Anemia in chronic kidney disease: Secondary | ICD-10-CM | POA: Diagnosis not present

## 2018-03-28 DIAGNOSIS — Z992 Dependence on renal dialysis: Secondary | ICD-10-CM | POA: Diagnosis not present

## 2018-03-28 DIAGNOSIS — K5909 Other constipation: Secondary | ICD-10-CM | POA: Diagnosis not present

## 2018-03-29 DIAGNOSIS — D631 Anemia in chronic kidney disease: Secondary | ICD-10-CM | POA: Diagnosis not present

## 2018-03-29 DIAGNOSIS — D509 Iron deficiency anemia, unspecified: Secondary | ICD-10-CM | POA: Diagnosis not present

## 2018-03-29 DIAGNOSIS — N2581 Secondary hyperparathyroidism of renal origin: Secondary | ICD-10-CM | POA: Diagnosis not present

## 2018-03-29 DIAGNOSIS — N186 End stage renal disease: Secondary | ICD-10-CM | POA: Diagnosis not present

## 2018-03-29 DIAGNOSIS — Z23 Encounter for immunization: Secondary | ICD-10-CM | POA: Diagnosis not present

## 2018-04-01 DIAGNOSIS — D631 Anemia in chronic kidney disease: Secondary | ICD-10-CM | POA: Diagnosis not present

## 2018-04-01 DIAGNOSIS — Z23 Encounter for immunization: Secondary | ICD-10-CM | POA: Diagnosis not present

## 2018-04-01 DIAGNOSIS — N2581 Secondary hyperparathyroidism of renal origin: Secondary | ICD-10-CM | POA: Diagnosis not present

## 2018-04-01 DIAGNOSIS — N186 End stage renal disease: Secondary | ICD-10-CM | POA: Diagnosis not present

## 2018-04-01 DIAGNOSIS — D509 Iron deficiency anemia, unspecified: Secondary | ICD-10-CM | POA: Diagnosis not present

## 2018-04-03 DIAGNOSIS — D509 Iron deficiency anemia, unspecified: Secondary | ICD-10-CM | POA: Diagnosis not present

## 2018-04-03 DIAGNOSIS — Z23 Encounter for immunization: Secondary | ICD-10-CM | POA: Diagnosis not present

## 2018-04-03 DIAGNOSIS — N2581 Secondary hyperparathyroidism of renal origin: Secondary | ICD-10-CM | POA: Diagnosis not present

## 2018-04-03 DIAGNOSIS — D631 Anemia in chronic kidney disease: Secondary | ICD-10-CM | POA: Diagnosis not present

## 2018-04-03 DIAGNOSIS — N186 End stage renal disease: Secondary | ICD-10-CM | POA: Diagnosis not present

## 2018-04-05 DIAGNOSIS — N2581 Secondary hyperparathyroidism of renal origin: Secondary | ICD-10-CM | POA: Diagnosis not present

## 2018-04-05 DIAGNOSIS — Z23 Encounter for immunization: Secondary | ICD-10-CM | POA: Diagnosis not present

## 2018-04-05 DIAGNOSIS — D631 Anemia in chronic kidney disease: Secondary | ICD-10-CM | POA: Diagnosis not present

## 2018-04-05 DIAGNOSIS — N186 End stage renal disease: Secondary | ICD-10-CM | POA: Diagnosis not present

## 2018-04-05 DIAGNOSIS — D509 Iron deficiency anemia, unspecified: Secondary | ICD-10-CM | POA: Diagnosis not present

## 2018-04-07 ENCOUNTER — Other Ambulatory Visit: Payer: Self-pay | Admitting: Gastroenterology

## 2018-04-07 DIAGNOSIS — K5900 Constipation, unspecified: Secondary | ICD-10-CM | POA: Diagnosis not present

## 2018-04-07 DIAGNOSIS — Z86010 Personal history of colonic polyps: Secondary | ICD-10-CM | POA: Diagnosis not present

## 2018-04-07 DIAGNOSIS — N19 Unspecified kidney failure: Secondary | ICD-10-CM | POA: Diagnosis not present

## 2018-04-08 DIAGNOSIS — N2581 Secondary hyperparathyroidism of renal origin: Secondary | ICD-10-CM | POA: Diagnosis not present

## 2018-04-08 DIAGNOSIS — D509 Iron deficiency anemia, unspecified: Secondary | ICD-10-CM | POA: Diagnosis not present

## 2018-04-08 DIAGNOSIS — D631 Anemia in chronic kidney disease: Secondary | ICD-10-CM | POA: Diagnosis not present

## 2018-04-08 DIAGNOSIS — Z23 Encounter for immunization: Secondary | ICD-10-CM | POA: Diagnosis not present

## 2018-04-08 DIAGNOSIS — N186 End stage renal disease: Secondary | ICD-10-CM | POA: Diagnosis not present

## 2018-04-10 DIAGNOSIS — N186 End stage renal disease: Secondary | ICD-10-CM | POA: Diagnosis not present

## 2018-04-10 DIAGNOSIS — Z23 Encounter for immunization: Secondary | ICD-10-CM | POA: Diagnosis not present

## 2018-04-10 DIAGNOSIS — D631 Anemia in chronic kidney disease: Secondary | ICD-10-CM | POA: Diagnosis not present

## 2018-04-10 DIAGNOSIS — D509 Iron deficiency anemia, unspecified: Secondary | ICD-10-CM | POA: Diagnosis not present

## 2018-04-10 DIAGNOSIS — N2581 Secondary hyperparathyroidism of renal origin: Secondary | ICD-10-CM | POA: Diagnosis not present

## 2018-04-12 DIAGNOSIS — N186 End stage renal disease: Secondary | ICD-10-CM | POA: Diagnosis not present

## 2018-04-12 DIAGNOSIS — N2581 Secondary hyperparathyroidism of renal origin: Secondary | ICD-10-CM | POA: Diagnosis not present

## 2018-04-12 DIAGNOSIS — D509 Iron deficiency anemia, unspecified: Secondary | ICD-10-CM | POA: Diagnosis not present

## 2018-04-12 DIAGNOSIS — D631 Anemia in chronic kidney disease: Secondary | ICD-10-CM | POA: Diagnosis not present

## 2018-04-12 DIAGNOSIS — Z23 Encounter for immunization: Secondary | ICD-10-CM | POA: Diagnosis not present

## 2018-04-15 DIAGNOSIS — Z23 Encounter for immunization: Secondary | ICD-10-CM | POA: Diagnosis not present

## 2018-04-15 DIAGNOSIS — N2581 Secondary hyperparathyroidism of renal origin: Secondary | ICD-10-CM | POA: Diagnosis not present

## 2018-04-15 DIAGNOSIS — N186 End stage renal disease: Secondary | ICD-10-CM | POA: Diagnosis not present

## 2018-04-15 DIAGNOSIS — D631 Anemia in chronic kidney disease: Secondary | ICD-10-CM | POA: Diagnosis not present

## 2018-04-15 DIAGNOSIS — D509 Iron deficiency anemia, unspecified: Secondary | ICD-10-CM | POA: Diagnosis not present

## 2018-04-17 DIAGNOSIS — N2581 Secondary hyperparathyroidism of renal origin: Secondary | ICD-10-CM | POA: Diagnosis not present

## 2018-04-17 DIAGNOSIS — Z23 Encounter for immunization: Secondary | ICD-10-CM | POA: Diagnosis not present

## 2018-04-17 DIAGNOSIS — D631 Anemia in chronic kidney disease: Secondary | ICD-10-CM | POA: Diagnosis not present

## 2018-04-17 DIAGNOSIS — N186 End stage renal disease: Secondary | ICD-10-CM | POA: Diagnosis not present

## 2018-04-17 DIAGNOSIS — D509 Iron deficiency anemia, unspecified: Secondary | ICD-10-CM | POA: Diagnosis not present

## 2018-04-19 DIAGNOSIS — Z23 Encounter for immunization: Secondary | ICD-10-CM | POA: Diagnosis not present

## 2018-04-19 DIAGNOSIS — N186 End stage renal disease: Secondary | ICD-10-CM | POA: Diagnosis not present

## 2018-04-19 DIAGNOSIS — D509 Iron deficiency anemia, unspecified: Secondary | ICD-10-CM | POA: Diagnosis not present

## 2018-04-19 DIAGNOSIS — D631 Anemia in chronic kidney disease: Secondary | ICD-10-CM | POA: Diagnosis not present

## 2018-04-19 DIAGNOSIS — N2581 Secondary hyperparathyroidism of renal origin: Secondary | ICD-10-CM | POA: Diagnosis not present

## 2018-04-22 ENCOUNTER — Other Ambulatory Visit: Payer: Self-pay

## 2018-04-22 ENCOUNTER — Encounter (HOSPITAL_COMMUNITY): Payer: Self-pay | Admitting: *Deleted

## 2018-04-22 DIAGNOSIS — N2581 Secondary hyperparathyroidism of renal origin: Secondary | ICD-10-CM | POA: Diagnosis not present

## 2018-04-22 DIAGNOSIS — D631 Anemia in chronic kidney disease: Secondary | ICD-10-CM | POA: Diagnosis not present

## 2018-04-22 DIAGNOSIS — N186 End stage renal disease: Secondary | ICD-10-CM | POA: Diagnosis not present

## 2018-04-22 DIAGNOSIS — Z992 Dependence on renal dialysis: Secondary | ICD-10-CM | POA: Diagnosis not present

## 2018-04-22 DIAGNOSIS — I120 Hypertensive chronic kidney disease with stage 5 chronic kidney disease or end stage renal disease: Secondary | ICD-10-CM | POA: Diagnosis not present

## 2018-04-24 DIAGNOSIS — N186 End stage renal disease: Secondary | ICD-10-CM | POA: Diagnosis not present

## 2018-04-24 DIAGNOSIS — D631 Anemia in chronic kidney disease: Secondary | ICD-10-CM | POA: Diagnosis not present

## 2018-04-24 DIAGNOSIS — N2581 Secondary hyperparathyroidism of renal origin: Secondary | ICD-10-CM | POA: Diagnosis not present

## 2018-04-26 DIAGNOSIS — D631 Anemia in chronic kidney disease: Secondary | ICD-10-CM | POA: Diagnosis not present

## 2018-04-26 DIAGNOSIS — N186 End stage renal disease: Secondary | ICD-10-CM | POA: Diagnosis not present

## 2018-04-26 DIAGNOSIS — N2581 Secondary hyperparathyroidism of renal origin: Secondary | ICD-10-CM | POA: Diagnosis not present

## 2018-04-29 DIAGNOSIS — N2581 Secondary hyperparathyroidism of renal origin: Secondary | ICD-10-CM | POA: Diagnosis not present

## 2018-04-29 DIAGNOSIS — D631 Anemia in chronic kidney disease: Secondary | ICD-10-CM | POA: Diagnosis not present

## 2018-04-29 DIAGNOSIS — N186 End stage renal disease: Secondary | ICD-10-CM | POA: Diagnosis not present

## 2018-05-01 DIAGNOSIS — D631 Anemia in chronic kidney disease: Secondary | ICD-10-CM | POA: Diagnosis not present

## 2018-05-01 DIAGNOSIS — N186 End stage renal disease: Secondary | ICD-10-CM | POA: Diagnosis not present

## 2018-05-01 DIAGNOSIS — N2581 Secondary hyperparathyroidism of renal origin: Secondary | ICD-10-CM | POA: Diagnosis not present

## 2018-05-02 DIAGNOSIS — I10 Essential (primary) hypertension: Secondary | ICD-10-CM | POA: Diagnosis not present

## 2018-05-02 DIAGNOSIS — N400 Enlarged prostate without lower urinary tract symptoms: Secondary | ICD-10-CM | POA: Diagnosis not present

## 2018-05-02 DIAGNOSIS — D638 Anemia in other chronic diseases classified elsewhere: Secondary | ICD-10-CM | POA: Diagnosis not present

## 2018-05-02 DIAGNOSIS — N529 Male erectile dysfunction, unspecified: Secondary | ICD-10-CM | POA: Diagnosis not present

## 2018-05-02 DIAGNOSIS — N184 Chronic kidney disease, stage 4 (severe): Secondary | ICD-10-CM | POA: Diagnosis not present

## 2018-05-02 DIAGNOSIS — M62838 Other muscle spasm: Secondary | ICD-10-CM | POA: Diagnosis not present

## 2018-05-02 DIAGNOSIS — I739 Peripheral vascular disease, unspecified: Secondary | ICD-10-CM | POA: Diagnosis not present

## 2018-05-02 DIAGNOSIS — K581 Irritable bowel syndrome with constipation: Secondary | ICD-10-CM | POA: Diagnosis not present

## 2018-05-03 DIAGNOSIS — N2581 Secondary hyperparathyroidism of renal origin: Secondary | ICD-10-CM | POA: Diagnosis not present

## 2018-05-03 DIAGNOSIS — N186 End stage renal disease: Secondary | ICD-10-CM | POA: Diagnosis not present

## 2018-05-03 DIAGNOSIS — D631 Anemia in chronic kidney disease: Secondary | ICD-10-CM | POA: Diagnosis not present

## 2018-05-05 ENCOUNTER — Other Ambulatory Visit: Payer: Self-pay

## 2018-05-05 ENCOUNTER — Encounter (HOSPITAL_COMMUNITY): Payer: Self-pay | Admitting: *Deleted

## 2018-05-06 DIAGNOSIS — N2581 Secondary hyperparathyroidism of renal origin: Secondary | ICD-10-CM | POA: Diagnosis not present

## 2018-05-06 DIAGNOSIS — N186 End stage renal disease: Secondary | ICD-10-CM | POA: Diagnosis not present

## 2018-05-06 DIAGNOSIS — D631 Anemia in chronic kidney disease: Secondary | ICD-10-CM | POA: Diagnosis not present

## 2018-05-08 DIAGNOSIS — D631 Anemia in chronic kidney disease: Secondary | ICD-10-CM | POA: Diagnosis not present

## 2018-05-08 DIAGNOSIS — N2581 Secondary hyperparathyroidism of renal origin: Secondary | ICD-10-CM | POA: Diagnosis not present

## 2018-05-08 DIAGNOSIS — N186 End stage renal disease: Secondary | ICD-10-CM | POA: Diagnosis not present

## 2018-05-09 ENCOUNTER — Encounter (HOSPITAL_COMMUNITY)
Admission: RE | Disposition: A | Discharge status: Home / Self Care | Payer: Self-pay | Source: Ambulatory Visit | Attending: Gastroenterology

## 2018-05-09 ENCOUNTER — Ambulatory Visit (HOSPITAL_COMMUNITY): Payer: Medicare Other | Admitting: Certified Registered Nurse Anesthetist

## 2018-05-09 ENCOUNTER — Ambulatory Visit (HOSPITAL_COMMUNITY)
Admission: RE | Admit: 2018-05-09 | Discharge: 2018-05-09 | Disposition: A | Discharge status: Home / Self Care | Payer: Medicare Other | Source: Ambulatory Visit | Attending: Gastroenterology | Admitting: Gastroenterology

## 2018-05-09 ENCOUNTER — Encounter (HOSPITAL_COMMUNITY): Payer: Self-pay | Admitting: *Deleted

## 2018-05-09 ENCOUNTER — Other Ambulatory Visit: Payer: Self-pay

## 2018-05-09 DIAGNOSIS — N186 End stage renal disease: Secondary | ICD-10-CM | POA: Diagnosis not present

## 2018-05-09 DIAGNOSIS — K5900 Constipation, unspecified: Secondary | ICD-10-CM | POA: Insufficient documentation

## 2018-05-09 DIAGNOSIS — Z538 Procedure and treatment not carried out for other reasons: Secondary | ICD-10-CM | POA: Insufficient documentation

## 2018-05-09 DIAGNOSIS — Z992 Dependence on renal dialysis: Secondary | ICD-10-CM | POA: Diagnosis not present

## 2018-05-09 DIAGNOSIS — Z1211 Encounter for screening for malignant neoplasm of colon: Secondary | ICD-10-CM | POA: Insufficient documentation

## 2018-05-09 DIAGNOSIS — Z8546 Personal history of malignant neoplasm of prostate: Secondary | ICD-10-CM | POA: Insufficient documentation

## 2018-05-09 DIAGNOSIS — Z87891 Personal history of nicotine dependence: Secondary | ICD-10-CM | POA: Diagnosis not present

## 2018-05-09 DIAGNOSIS — I120 Hypertensive chronic kidney disease with stage 5 chronic kidney disease or end stage renal disease: Secondary | ICD-10-CM | POA: Diagnosis not present

## 2018-05-09 DIAGNOSIS — Z86010 Personal history of colonic polyps: Secondary | ICD-10-CM | POA: Insufficient documentation

## 2018-05-09 HISTORY — PX: COLONOSCOPY WITH PROPOFOL: SHX5780

## 2018-05-09 HISTORY — DX: Cervicalgia: M54.2

## 2018-05-09 LAB — POCT I-STAT 4, (NA,K, GLUC, HGB,HCT)
Glucose, Bld: 68 mg/dL — ABNORMAL LOW (ref 70–99)
HCT: 42 % (ref 39.0–52.0)
Hemoglobin: 14.3 g/dL (ref 13.0–17.0)
Potassium: 5 mmol/L (ref 3.5–5.1)
Sodium: 139 mmol/L (ref 135–145)

## 2018-05-09 SURGERY — COLONOSCOPY WITH PROPOFOL
Anesthesia: Monitor Anesthesia Care

## 2018-05-09 MED ORDER — PROPOFOL 500 MG/50ML IV EMUL
INTRAVENOUS | Status: DC | PRN
  Administered 2018-05-09: 150 ug/kg/min via INTRAVENOUS

## 2018-05-09 MED ORDER — LACTATED RINGERS IV SOLN
INTRAVENOUS | Status: DC | PRN
  Administered 2018-05-09: 11:00:00 via INTRAVENOUS

## 2018-05-09 MED ORDER — PROPOFOL 10 MG/ML IV BOLUS
INTRAVENOUS | Status: AC
  Filled 2018-05-09: qty 40

## 2018-05-09 MED ORDER — PROPOFOL 10 MG/ML IV BOLUS
INTRAVENOUS | Status: DC | PRN
  Administered 2018-05-09: 30 mg via INTRAVENOUS

## 2018-05-09 MED ORDER — PROPOFOL 10 MG/ML IV BOLUS
INTRAVENOUS | Status: AC
  Filled 2018-05-09: qty 60

## 2018-05-09 MED ORDER — SODIUM CHLORIDE 0.9 % IV SOLN
INTRAVENOUS | Status: DC
  Administered 2018-05-09: 11:00:00 via INTRAVENOUS

## 2018-05-09 SURGICAL SUPPLY — 21 items

## 2018-05-09 NOTE — Discharge Instructions (Signed)

## 2018-05-09 NOTE — Op Note (Signed)
[facility] Patient Name: Andrew Brandt Procedure Date: 05/09/2018 MRN: [id number] Attending MD: Carol Ada , MD Date of Birth: Jun 18, 1951 CSN: [id number] Age: 67 Admit Type: Outpatient Procedure:                Colonoscopy Indications:              High risk colon cancer surveillance: Personal                            history of colonic polyps Providers:                Carol Ada, MD, Elmer Ramp. Tilden Dome, RN, Elspeth Cho Tech., Technician, Dellie Catholic, CRNA Referring MD:              Medicines:                Propofol per Anesthesia Complications:            No immediate complications. Estimated Blood Loss:     Estimated blood loss: none. Procedure:                Pre-Anesthesia Assessment:                           - Prior to the procedure, a History and Physical                            was performed, and patient medications and                            allergies were reviewed. The patient's tolerance of                            previous anesthesia was also reviewed. The risks                            and benefits of the procedure and the sedation                            options and risks were discussed with the patient.                            All questions were answered, and informed consent                            was obtained. Prior Anticoagulants: The patient has                            taken no previous anticoagulant or antiplatelet                            agents. ASA Grade Assessment: III - A patient with  severe systemic disease. After reviewing the risks                            and benefits, the patient was deemed in                            satisfactory condition to undergo the procedure.                           - Sedation was administered by an anesthesia                            professional. Deep sedation was attained.                           After obtaining  informed consent, the colonoscope                            was passed under direct vision. Throughout the                            procedure, the patient's blood pressure, pulse, and                            oxygen saturations were monitored continuously. The                            PCF-H190DL ([id number]) Olympus peds colonoscope was                            introduced through the anus with the intention of                            advancing to the cecum. The scope was advanced to                            the rectum before the procedure was aborted.                            Medications were given. The colonoscopy was                            technically difficult and complex due to inadequate                            bowel prep. The patient tolerated the procedure                            well. The quality of the bowel preparation was poor. Scope In: Scope Out: Findings:      Semi-solid stool was found in the rectum, precluding visualization. Impression:               - Preparation of the colon was poor.                           -  Stool in the rectum.                           - No specimens collected. Moderate Sedation:      N/A- Per Anesthesia Care Recommendation:           - Patient has a contact number available for                            emergencies. The signs and symptoms of potential                            delayed complications were discussed with the                            patient. Return to normal activities tomorrow.                            Written discharge instructions were provided to the                            patient.                           - Resume previous diet.                           - Continue present medications.                           - Repeat colonoscopy at the next available                            appointment because the bowel preparation was                            suboptimal. Procedure Code(s):         --- Professional ---                           [phone], 53, Colonoscopy, flexible; diagnostic,                            including collection of specimen(s) by brushing or                            washing, when performed (separate procedure) Diagnosis Code(s):        --- Professional ---                           Z86.010, Personal history of colonic polyps CPT copyright 2018 American Medical Association. All rights reserved. The codes documented in this report are preliminary and upon coder review may  be revised to meet current compliance requirements. Carol Ada, MD Carol Ada, MD 05/09/2018 11:49:40 AM This report has been signed electronically. Number of Addenda: 0

## 2018-05-09 NOTE — Anesthesia Preprocedure Evaluation (Signed)
Anesthesia Evaluation  Patient identified by MRN, date of birth, ID band Patient awake    Reviewed: Allergy & Precautions, NPO status , Patient's Chart, lab work & pertinent test results  Airway Mallampati: II  TM Distance: >3 FB Neck ROM: Full    Dental no notable dental hx.    Pulmonary neg pulmonary ROS, former smoker,    Pulmonary exam normal breath sounds clear to auscultation       Cardiovascular hypertension, Normal cardiovascular exam Rhythm:Regular Rate:Normal     Neuro/Psych negative neurological ROS  negative psych ROS   GI/Hepatic negative GI ROS, Neg liver ROS,   Endo/Other  negative endocrine ROS  Renal/GU ESRF and DialysisRenal disease  negative genitourinary   Musculoskeletal negative musculoskeletal ROS (+)   Abdominal   Peds negative pediatric ROS (+)  Hematology negative hematology ROS (+)   Anesthesia Other Findings   Reproductive/Obstetrics negative OB ROS                             Anesthesia Physical Anesthesia Plan  ASA: III  Anesthesia Plan: MAC   Post-op Pain Management:    Induction:   PONV Risk Score and Plan: 1 and Treatment may vary due to age or medical condition  Airway Management Planned: Natural Airway, Nasal Cannula and Simple Face Mask  Additional Equipment:   Intra-op Plan:   Post-operative Plan:   Informed Consent: I have reviewed the patients History and Physical, chart, labs and discussed the procedure including the risks, benefits and alternatives for the proposed anesthesia with the patient or authorized representative who has indicated his/her understanding and acceptance.   Dental advisory given  Plan Discussed with: CRNA  Anesthesia Plan Comments:         Anesthesia Quick Evaluation

## 2018-05-09 NOTE — Transfer of Care (Signed)
Immediate Anesthesia Transfer of Care Note  Patient: Andrew Brandt  Procedure(s) Performed: COLONOSCOPY WITH PROPOFOL (N/A )  Patient Location: PACU and Endoscopy Unit  Anesthesia Type:MAC  Level of Consciousness: awake, alert , oriented and patient cooperative  Airway & Oxygen Therapy: Patient Spontanous Breathing and Patient connected to face mask  Post-op Assessment: Report given to RN and Post -op Vital signs reviewed and stable  Post vital signs: Reviewed and stable  Last Vitals:  Vitals Value Taken Time  BP 96/53 05/09/2018 11:50 AM  Temp    Pulse 84 05/09/2018 11:52 AM  Resp 15 05/09/2018 11:52 AM  SpO2 100 % 05/09/2018 11:52 AM  Vitals shown include unvalidated device data.  Last Pain:  Vitals:   05/09/18 1105  TempSrc: Oral  PainSc: 0-No pain         Complications: No apparent anesthesia complications

## 2018-05-09 NOTE — H&P (Signed)
  [place] HPI: At this time the patient denies any problems with nausea, vomiting, fevers, chills, abdominal pain, diarrhea, hematochezia, GERD, or dysphagia. The patient denies any known family history of colon cancers. No complaints of chest pain, SOB, MI, or sleep apnea. The patient's colonoscopy on 03/25/2015 was significant for multiple adenomas in the ascending and transverse colon. The patient uses Miralax for his constipation, but it is not as effective as he desires.   Past Medical History:  Diagnosis Date  . Cancer [facility]) 2017   prostate cancer  . Chronic kidney disease    Dialysis Tues-Thurs[facility] [address]  . Constipation   . Headache    occ  . Hypertension   . Neck pain    pulled muscle in neck on cyclobenzaprine prn    Past Surgical History:  Procedure Laterality Date  . AV FISTULA PLACEMENT Left 11/23/2013   Procedure: ARTERIOVENOUS (AV) FISTULA CREATION VS. GRAFT;  Surgeon: Rosetta Posner, MD;  Location: [facility] OR;  Service: Vascular;  Laterality: Left;  . AV FISTULA PLACEMENT Left 07/12/2017   Procedure: INSERTION OF ARTERIOVENOUS (AV) GORE-TEX GRAFT ARM LEFT UPPER ARM;  Surgeon: Elam Dutch, MD;  Location: [facility] OR;  Service: Vascular;  Laterality: Left;  . AV FISTULA PLACEMENT Right 10/21/2017   Procedure: ARTERIOVENOUS ARM (AV) FISTULA CREATION;  Surgeon: Elam Dutch, MD;  Location: [place];  Service: Vascular;  Laterality: Right;  . COLONOSCOPY WITH PROPOFOL N/A 03/25/2015   Procedure: COLONOSCOPY WITH PROPOFOL;  Surgeon: Carol Ada, MD;  Location: [facility] ENDOSCOPY;  Service: Endoscopy;  Laterality: N/A;  . INSERTION OF DIALYSIS CATHETER N/A 11/23/2013   Procedure: INSERTION OF DIALYSIS CATHETER RIGHT INTERNAL JUGULAR VEIN;  Surgeon: Rosetta Posner, MD;  Location: [place];  Service: Vascular;  Laterality: N/A;  . PROSTATECTOMY    . THROMBECTOMY AND REVISION OF ARTERIOVENTOUS (AV) GORETEX  GRAFT Left 09/02/2017   Procedure: THROMBECTOMY  OF Left Arm   ARTERIOVENTOUS (AV) GORETEX  GRAFT;  Surgeon: Angelia Mould, MD;  Location: [facility] OR;  Service: Vascular;  Laterality: Left;    Family History  Problem Relation Age of Onset  . Cancer Mother   . Diabetes Unknown        grandparents    Social History:  reports that he has quit smoking. His smoking use included cigarettes. He has never used smokeless tobacco. He reports that he drinks alcohol. He reports that he does not use drugs.  Allergies: No Known Allergies  Medications: Scheduled: Continuous:  No results found for this or any previous visit (from the past 24 hour(s)).   No results found.  ROS:  As stated above in the HPI otherwise negative.  Blood pressure (!) 160/89, pulse 89, temperature 98 F (36.7 C), temperature source Oral, resp. rate 16, height 5\' 10"  (1.778 m), weight 90.7 kg, SpO2 99 %.    PE: Gen: NAD, Alert and Oriented HEENT:  [place]/AT, EOMI Neck: Supple, no LAD Lungs: CTA Bilaterally CV: RRR without M/G/R ABM: Soft, NTND, +BS Ext: No C/C/E  Assessment/Plan: 1) Personal history of polyps - colonoscopy.  Hawken Bielby D 05/09/2018, 11:16 AM

## 2018-05-09 NOTE — Anesthesia Postprocedure Evaluation (Signed)
Anesthesia Post Note  Patient: Andrew Brandt  Procedure(s) Performed: COLONOSCOPY WITH PROPOFOL (N/A )     Patient location during evaluation: PACU Anesthesia Type: MAC Level of consciousness: awake and alert Pain management: pain level controlled Vital Signs Assessment: post-procedure vital signs reviewed and stable Respiratory status: spontaneous breathing, nonlabored ventilation, respiratory function stable and patient connected to nasal cannula oxygen Cardiovascular status: stable and blood pressure returned to baseline Postop Assessment: no apparent nausea or vomiting Anesthetic complications: no    Last Vitals:  Vitals:   05/09/18 1150 05/09/18 1200  BP: (!) 96/53 117/70  Pulse:  89  Resp: 15 18  Temp:    SpO2:  97%    Last Pain:  Vitals:   05/09/18 1105  TempSrc: Oral  PainSc: 0-No pain                 [name]

## 2018-05-10 DIAGNOSIS — N2581 Secondary hyperparathyroidism of renal origin: Secondary | ICD-10-CM | POA: Diagnosis not present

## 2018-05-10 DIAGNOSIS — D631 Anemia in chronic kidney disease: Secondary | ICD-10-CM | POA: Diagnosis not present

## 2018-05-10 DIAGNOSIS — N186 End stage renal disease: Secondary | ICD-10-CM | POA: Diagnosis not present

## 2018-05-12 ENCOUNTER — Encounter (HOSPITAL_COMMUNITY): Payer: Self-pay | Admitting: Gastroenterology

## 2018-05-13 DIAGNOSIS — N2581 Secondary hyperparathyroidism of renal origin: Secondary | ICD-10-CM | POA: Diagnosis not present

## 2018-05-13 DIAGNOSIS — N186 End stage renal disease: Secondary | ICD-10-CM | POA: Diagnosis not present

## 2018-05-13 DIAGNOSIS — D631 Anemia in chronic kidney disease: Secondary | ICD-10-CM | POA: Diagnosis not present

## 2018-05-15 DIAGNOSIS — N186 End stage renal disease: Secondary | ICD-10-CM | POA: Diagnosis not present

## 2018-05-15 DIAGNOSIS — N2581 Secondary hyperparathyroidism of renal origin: Secondary | ICD-10-CM | POA: Diagnosis not present

## 2018-05-15 DIAGNOSIS — D631 Anemia in chronic kidney disease: Secondary | ICD-10-CM | POA: Diagnosis not present

## 2018-05-17 DIAGNOSIS — N186 End stage renal disease: Secondary | ICD-10-CM | POA: Diagnosis not present

## 2018-05-17 DIAGNOSIS — N2581 Secondary hyperparathyroidism of renal origin: Secondary | ICD-10-CM | POA: Diagnosis not present

## 2018-05-17 DIAGNOSIS — D631 Anemia in chronic kidney disease: Secondary | ICD-10-CM | POA: Diagnosis not present

## 2018-05-20 DIAGNOSIS — D631 Anemia in chronic kidney disease: Secondary | ICD-10-CM | POA: Diagnosis not present

## 2018-05-20 DIAGNOSIS — N186 End stage renal disease: Secondary | ICD-10-CM | POA: Diagnosis not present

## 2018-05-20 DIAGNOSIS — N2581 Secondary hyperparathyroidism of renal origin: Secondary | ICD-10-CM | POA: Diagnosis not present

## 2018-05-22 DIAGNOSIS — D631 Anemia in chronic kidney disease: Secondary | ICD-10-CM | POA: Diagnosis not present

## 2018-05-22 DIAGNOSIS — N2581 Secondary hyperparathyroidism of renal origin: Secondary | ICD-10-CM | POA: Diagnosis not present

## 2018-05-22 DIAGNOSIS — N186 End stage renal disease: Secondary | ICD-10-CM | POA: Diagnosis not present

## 2018-05-23 DIAGNOSIS — Z992 Dependence on renal dialysis: Secondary | ICD-10-CM | POA: Diagnosis not present

## 2018-05-23 DIAGNOSIS — N186 End stage renal disease: Secondary | ICD-10-CM | POA: Diagnosis not present

## 2018-05-23 DIAGNOSIS — I120 Hypertensive chronic kidney disease with stage 5 chronic kidney disease or end stage renal disease: Secondary | ICD-10-CM | POA: Diagnosis not present

## 2018-05-24 DIAGNOSIS — N186 End stage renal disease: Secondary | ICD-10-CM | POA: Diagnosis not present

## 2018-05-24 DIAGNOSIS — N2581 Secondary hyperparathyroidism of renal origin: Secondary | ICD-10-CM | POA: Diagnosis not present

## 2018-05-27 DIAGNOSIS — N2581 Secondary hyperparathyroidism of renal origin: Secondary | ICD-10-CM | POA: Diagnosis not present

## 2018-05-27 DIAGNOSIS — N186 End stage renal disease: Secondary | ICD-10-CM | POA: Diagnosis not present

## 2018-05-29 DIAGNOSIS — N2581 Secondary hyperparathyroidism of renal origin: Secondary | ICD-10-CM | POA: Diagnosis not present

## 2018-05-29 DIAGNOSIS — N186 End stage renal disease: Secondary | ICD-10-CM | POA: Diagnosis not present

## 2018-05-31 DIAGNOSIS — N186 End stage renal disease: Secondary | ICD-10-CM | POA: Diagnosis not present

## 2018-05-31 DIAGNOSIS — N2581 Secondary hyperparathyroidism of renal origin: Secondary | ICD-10-CM | POA: Diagnosis not present

## 2018-06-03 DIAGNOSIS — N186 End stage renal disease: Secondary | ICD-10-CM | POA: Diagnosis not present

## 2018-06-03 DIAGNOSIS — N2581 Secondary hyperparathyroidism of renal origin: Secondary | ICD-10-CM | POA: Diagnosis not present

## 2018-06-05 DIAGNOSIS — N186 End stage renal disease: Secondary | ICD-10-CM | POA: Diagnosis not present

## 2018-06-05 DIAGNOSIS — N2581 Secondary hyperparathyroidism of renal origin: Secondary | ICD-10-CM | POA: Diagnosis not present

## 2018-06-07 DIAGNOSIS — N2581 Secondary hyperparathyroidism of renal origin: Secondary | ICD-10-CM | POA: Diagnosis not present

## 2018-06-07 DIAGNOSIS — N186 End stage renal disease: Secondary | ICD-10-CM | POA: Diagnosis not present

## 2018-06-10 DIAGNOSIS — N2581 Secondary hyperparathyroidism of renal origin: Secondary | ICD-10-CM | POA: Diagnosis not present

## 2018-06-10 DIAGNOSIS — N186 End stage renal disease: Secondary | ICD-10-CM | POA: Diagnosis not present

## 2018-06-12 DIAGNOSIS — N186 End stage renal disease: Secondary | ICD-10-CM | POA: Diagnosis not present

## 2018-06-12 DIAGNOSIS — N2581 Secondary hyperparathyroidism of renal origin: Secondary | ICD-10-CM | POA: Diagnosis not present

## 2018-06-13 ENCOUNTER — Other Ambulatory Visit: Payer: Self-pay | Admitting: Gastroenterology

## 2018-06-14 DIAGNOSIS — N2581 Secondary hyperparathyroidism of renal origin: Secondary | ICD-10-CM | POA: Diagnosis not present

## 2018-06-14 DIAGNOSIS — N186 End stage renal disease: Secondary | ICD-10-CM | POA: Diagnosis not present

## 2018-06-16 DIAGNOSIS — N186 End stage renal disease: Secondary | ICD-10-CM | POA: Diagnosis not present

## 2018-06-16 DIAGNOSIS — N2581 Secondary hyperparathyroidism of renal origin: Secondary | ICD-10-CM | POA: Diagnosis not present

## 2018-06-18 DIAGNOSIS — N2581 Secondary hyperparathyroidism of renal origin: Secondary | ICD-10-CM | POA: Diagnosis not present

## 2018-06-18 DIAGNOSIS — N186 End stage renal disease: Secondary | ICD-10-CM | POA: Diagnosis not present

## 2018-06-21 DIAGNOSIS — N2581 Secondary hyperparathyroidism of renal origin: Secondary | ICD-10-CM | POA: Diagnosis not present

## 2018-06-21 DIAGNOSIS — N186 End stage renal disease: Secondary | ICD-10-CM | POA: Diagnosis not present

## 2018-06-22 DIAGNOSIS — Z992 Dependence on renal dialysis: Secondary | ICD-10-CM | POA: Diagnosis not present

## 2018-06-22 DIAGNOSIS — N186 End stage renal disease: Secondary | ICD-10-CM | POA: Diagnosis not present

## 2018-06-22 DIAGNOSIS — I120 Hypertensive chronic kidney disease with stage 5 chronic kidney disease or end stage renal disease: Secondary | ICD-10-CM | POA: Diagnosis not present

## 2018-06-24 DIAGNOSIS — N2581 Secondary hyperparathyroidism of renal origin: Secondary | ICD-10-CM | POA: Diagnosis not present

## 2018-06-24 DIAGNOSIS — N186 End stage renal disease: Secondary | ICD-10-CM | POA: Diagnosis not present

## 2018-06-24 DIAGNOSIS — D631 Anemia in chronic kidney disease: Secondary | ICD-10-CM | POA: Diagnosis not present

## 2018-06-26 DIAGNOSIS — D631 Anemia in chronic kidney disease: Secondary | ICD-10-CM | POA: Diagnosis not present

## 2018-06-26 DIAGNOSIS — N2581 Secondary hyperparathyroidism of renal origin: Secondary | ICD-10-CM | POA: Diagnosis not present

## 2018-06-26 DIAGNOSIS — N186 End stage renal disease: Secondary | ICD-10-CM | POA: Diagnosis not present

## 2018-06-28 DIAGNOSIS — N2581 Secondary hyperparathyroidism of renal origin: Secondary | ICD-10-CM | POA: Diagnosis not present

## 2018-06-28 DIAGNOSIS — N186 End stage renal disease: Secondary | ICD-10-CM | POA: Diagnosis not present

## 2018-06-28 DIAGNOSIS — D631 Anemia in chronic kidney disease: Secondary | ICD-10-CM | POA: Diagnosis not present

## 2018-07-01 DIAGNOSIS — D631 Anemia in chronic kidney disease: Secondary | ICD-10-CM | POA: Diagnosis not present

## 2018-07-01 DIAGNOSIS — N186 End stage renal disease: Secondary | ICD-10-CM | POA: Diagnosis not present

## 2018-07-01 DIAGNOSIS — N2581 Secondary hyperparathyroidism of renal origin: Secondary | ICD-10-CM | POA: Diagnosis not present

## 2018-07-03 DIAGNOSIS — N186 End stage renal disease: Secondary | ICD-10-CM | POA: Diagnosis not present

## 2018-07-03 DIAGNOSIS — D631 Anemia in chronic kidney disease: Secondary | ICD-10-CM | POA: Diagnosis not present

## 2018-07-03 DIAGNOSIS — N2581 Secondary hyperparathyroidism of renal origin: Secondary | ICD-10-CM | POA: Diagnosis not present

## 2018-07-05 DIAGNOSIS — D631 Anemia in chronic kidney disease: Secondary | ICD-10-CM | POA: Diagnosis not present

## 2018-07-05 DIAGNOSIS — N2581 Secondary hyperparathyroidism of renal origin: Secondary | ICD-10-CM | POA: Diagnosis not present

## 2018-07-05 DIAGNOSIS — N186 End stage renal disease: Secondary | ICD-10-CM | POA: Diagnosis not present

## 2018-07-08 DIAGNOSIS — N186 End stage renal disease: Secondary | ICD-10-CM | POA: Diagnosis not present

## 2018-07-08 DIAGNOSIS — N2581 Secondary hyperparathyroidism of renal origin: Secondary | ICD-10-CM | POA: Diagnosis not present

## 2018-07-08 DIAGNOSIS — D631 Anemia in chronic kidney disease: Secondary | ICD-10-CM | POA: Diagnosis not present

## 2018-07-10 DIAGNOSIS — D631 Anemia in chronic kidney disease: Secondary | ICD-10-CM | POA: Diagnosis not present

## 2018-07-10 DIAGNOSIS — N2581 Secondary hyperparathyroidism of renal origin: Secondary | ICD-10-CM | POA: Diagnosis not present

## 2018-07-10 DIAGNOSIS — N186 End stage renal disease: Secondary | ICD-10-CM | POA: Diagnosis not present

## 2018-07-11 ENCOUNTER — Ambulatory Visit (HOSPITAL_COMMUNITY): Admit: 2018-07-11 | Payer: Medicare Other | Admitting: Gastroenterology

## 2018-07-11 ENCOUNTER — Encounter (HOSPITAL_COMMUNITY): Payer: Self-pay

## 2018-07-11 SURGERY — COLONOSCOPY WITH PROPOFOL
Anesthesia: Monitor Anesthesia Care

## 2018-07-12 DIAGNOSIS — D631 Anemia in chronic kidney disease: Secondary | ICD-10-CM | POA: Diagnosis not present

## 2018-07-12 DIAGNOSIS — N186 End stage renal disease: Secondary | ICD-10-CM | POA: Diagnosis not present

## 2018-07-12 DIAGNOSIS — N2581 Secondary hyperparathyroidism of renal origin: Secondary | ICD-10-CM | POA: Diagnosis not present

## 2018-07-14 DIAGNOSIS — N186 End stage renal disease: Secondary | ICD-10-CM | POA: Diagnosis not present

## 2018-07-14 DIAGNOSIS — N2581 Secondary hyperparathyroidism of renal origin: Secondary | ICD-10-CM | POA: Diagnosis not present

## 2018-07-14 DIAGNOSIS — D631 Anemia in chronic kidney disease: Secondary | ICD-10-CM | POA: Diagnosis not present

## 2018-07-17 DIAGNOSIS — N186 End stage renal disease: Secondary | ICD-10-CM | POA: Diagnosis not present

## 2018-07-17 DIAGNOSIS — D631 Anemia in chronic kidney disease: Secondary | ICD-10-CM | POA: Diagnosis not present

## 2018-07-17 DIAGNOSIS — N2581 Secondary hyperparathyroidism of renal origin: Secondary | ICD-10-CM | POA: Diagnosis not present

## 2018-07-19 DIAGNOSIS — N186 End stage renal disease: Secondary | ICD-10-CM | POA: Diagnosis not present

## 2018-07-19 DIAGNOSIS — D631 Anemia in chronic kidney disease: Secondary | ICD-10-CM | POA: Diagnosis not present

## 2018-07-19 DIAGNOSIS — N2581 Secondary hyperparathyroidism of renal origin: Secondary | ICD-10-CM | POA: Diagnosis not present

## 2018-07-21 DIAGNOSIS — N186 End stage renal disease: Secondary | ICD-10-CM | POA: Diagnosis not present

## 2018-07-21 DIAGNOSIS — D631 Anemia in chronic kidney disease: Secondary | ICD-10-CM | POA: Diagnosis not present

## 2018-07-21 DIAGNOSIS — N2581 Secondary hyperparathyroidism of renal origin: Secondary | ICD-10-CM | POA: Diagnosis not present

## 2018-07-23 DIAGNOSIS — I120 Hypertensive chronic kidney disease with stage 5 chronic kidney disease or end stage renal disease: Secondary | ICD-10-CM | POA: Diagnosis not present

## 2018-07-23 DIAGNOSIS — Z992 Dependence on renal dialysis: Secondary | ICD-10-CM | POA: Diagnosis not present

## 2018-07-23 DIAGNOSIS — N186 End stage renal disease: Secondary | ICD-10-CM | POA: Diagnosis not present

## 2018-07-24 DIAGNOSIS — N2581 Secondary hyperparathyroidism of renal origin: Secondary | ICD-10-CM | POA: Diagnosis not present

## 2018-07-24 DIAGNOSIS — D631 Anemia in chronic kidney disease: Secondary | ICD-10-CM | POA: Diagnosis not present

## 2018-07-24 DIAGNOSIS — Z992 Dependence on renal dialysis: Secondary | ICD-10-CM | POA: Diagnosis not present

## 2018-07-24 DIAGNOSIS — N186 End stage renal disease: Secondary | ICD-10-CM | POA: Diagnosis not present

## 2018-07-26 DIAGNOSIS — Z992 Dependence on renal dialysis: Secondary | ICD-10-CM | POA: Diagnosis not present

## 2018-07-26 DIAGNOSIS — N2581 Secondary hyperparathyroidism of renal origin: Secondary | ICD-10-CM | POA: Diagnosis not present

## 2018-07-26 DIAGNOSIS — D631 Anemia in chronic kidney disease: Secondary | ICD-10-CM | POA: Diagnosis not present

## 2018-07-26 DIAGNOSIS — N186 End stage renal disease: Secondary | ICD-10-CM | POA: Diagnosis not present

## 2018-07-29 DIAGNOSIS — Z992 Dependence on renal dialysis: Secondary | ICD-10-CM | POA: Diagnosis not present

## 2018-07-29 DIAGNOSIS — D631 Anemia in chronic kidney disease: Secondary | ICD-10-CM | POA: Diagnosis not present

## 2018-07-29 DIAGNOSIS — N2581 Secondary hyperparathyroidism of renal origin: Secondary | ICD-10-CM | POA: Diagnosis not present

## 2018-07-29 DIAGNOSIS — N186 End stage renal disease: Secondary | ICD-10-CM | POA: Diagnosis not present

## 2018-07-30 ENCOUNTER — Emergency Department (HOSPITAL_COMMUNITY)
Admission: EM | Admit: 2018-07-30 | Discharge: 2018-07-31 | Disposition: A | Discharge status: Home / Self Care | Payer: Medicare Other | Attending: Emergency Medicine | Admitting: Emergency Medicine

## 2018-07-30 ENCOUNTER — Encounter (HOSPITAL_COMMUNITY): Payer: Self-pay

## 2018-07-30 DIAGNOSIS — N529 Male erectile dysfunction, unspecified: Secondary | ICD-10-CM | POA: Diagnosis not present

## 2018-07-30 DIAGNOSIS — N186 End stage renal disease: Secondary | ICD-10-CM | POA: Diagnosis not present

## 2018-07-30 DIAGNOSIS — I120 Hypertensive chronic kidney disease with stage 5 chronic kidney disease or end stage renal disease: Secondary | ICD-10-CM | POA: Insufficient documentation

## 2018-07-30 DIAGNOSIS — D631 Anemia in chronic kidney disease: Secondary | ICD-10-CM

## 2018-07-30 DIAGNOSIS — N189 Chronic kidney disease, unspecified: Secondary | ICD-10-CM

## 2018-07-30 DIAGNOSIS — N184 Chronic kidney disease, stage 4 (severe): Secondary | ICD-10-CM | POA: Diagnosis not present

## 2018-07-30 DIAGNOSIS — R112 Nausea with vomiting, unspecified: Secondary | ICD-10-CM | POA: Diagnosis not present

## 2018-07-30 DIAGNOSIS — Z87891 Personal history of nicotine dependence: Secondary | ICD-10-CM | POA: Diagnosis not present

## 2018-07-30 DIAGNOSIS — Z992 Dependence on renal dialysis: Secondary | ICD-10-CM

## 2018-07-30 DIAGNOSIS — K5900 Constipation, unspecified: Secondary | ICD-10-CM | POA: Diagnosis not present

## 2018-07-30 DIAGNOSIS — K581 Irritable bowel syndrome with constipation: Secondary | ICD-10-CM | POA: Diagnosis not present

## 2018-07-30 DIAGNOSIS — E860 Dehydration: Secondary | ICD-10-CM | POA: Diagnosis not present

## 2018-07-30 DIAGNOSIS — D638 Anemia in other chronic diseases classified elsewhere: Secondary | ICD-10-CM | POA: Diagnosis not present

## 2018-07-30 DIAGNOSIS — I739 Peripheral vascular disease, unspecified: Secondary | ICD-10-CM | POA: Diagnosis not present

## 2018-07-30 DIAGNOSIS — K5901 Slow transit constipation: Secondary | ICD-10-CM

## 2018-07-30 DIAGNOSIS — K5730 Diverticulosis of large intestine without perforation or abscess without bleeding: Secondary | ICD-10-CM | POA: Diagnosis not present

## 2018-07-30 DIAGNOSIS — Z79899 Other long term (current) drug therapy: Secondary | ICD-10-CM | POA: Diagnosis not present

## 2018-07-30 DIAGNOSIS — K5652 Intestinal adhesions [bands] with complete obstruction: Secondary | ICD-10-CM | POA: Diagnosis not present

## 2018-07-30 DIAGNOSIS — R109 Unspecified abdominal pain: Secondary | ICD-10-CM | POA: Diagnosis present

## 2018-07-30 DIAGNOSIS — I10 Essential (primary) hypertension: Secondary | ICD-10-CM | POA: Diagnosis not present

## 2018-07-30 DIAGNOSIS — K6389 Other specified diseases of intestine: Secondary | ICD-10-CM

## 2018-07-30 DIAGNOSIS — K591 Functional diarrhea: Secondary | ICD-10-CM | POA: Diagnosis not present

## 2018-07-30 DIAGNOSIS — M62838 Other muscle spasm: Secondary | ICD-10-CM | POA: Diagnosis not present

## 2018-07-30 DIAGNOSIS — N400 Enlarged prostate without lower urinary tract symptoms: Secondary | ICD-10-CM | POA: Diagnosis not present

## 2018-07-30 DIAGNOSIS — E876 Hypokalemia: Secondary | ICD-10-CM

## 2018-07-30 LAB — COMPREHENSIVE METABOLIC PANEL
ALT: 10 U/L (ref 0–44)
AST: 18 U/L (ref 15–41)
Albumin: 4 g/dL (ref 3.5–5.0)
Alkaline Phosphatase: 39 U/L (ref 38–126)
Anion gap: 18 — ABNORMAL HIGH (ref 5–15)
BUN: 25 mg/dL — ABNORMAL HIGH (ref 8–23)
CO2: 25 mmol/L (ref 22–32)
Calcium: 10.1 mg/dL (ref 8.9–10.3)
Chloride: 97 mmol/L — ABNORMAL LOW (ref 98–111)
Creatinine, Ser: 8.73 mg/dL — ABNORMAL HIGH (ref 0.61–1.24)
GFR calc Af Amer: 6 mL/min — ABNORMAL LOW (ref >60)
GFR calc non Af Amer: 6 mL/min — ABNORMAL LOW (ref >60)
Glucose, Bld: 83 mg/dL (ref 70–99)
Potassium: 2.9 mmol/L — ABNORMAL LOW (ref 3.5–5.1)
Sodium: 140 mmol/L (ref 135–145)
Total Bilirubin: 1 mg/dL (ref 0.3–1.2)
Total Protein: 7.4 g/dL (ref 6.5–8.1)

## 2018-07-30 LAB — CBC
HCT: 35.3 % — ABNORMAL LOW (ref 39.0–52.0)
Hemoglobin: 11 g/dL — ABNORMAL LOW (ref 13.0–17.0)
MCH: 30.5 pg (ref 26.0–34.0)
MCHC: 31.2 g/dL (ref 30.0–36.0)
MCV: 97.8 fL (ref 80.0–100.0)
Platelets: 140 10*3/uL — ABNORMAL LOW (ref 150–400)
RBC: 3.61 MIL/uL — ABNORMAL LOW (ref 4.22–5.81)
RDW: 16.8 % — ABNORMAL HIGH (ref 11.5–15.5)
WBC: 5.1 10*3/uL (ref 4.0–10.5)
nRBC: 0 % (ref 0.0–0.2)

## 2018-07-30 LAB — LIPASE, BLOOD: Lipase: 25 U/L (ref 11–51)

## 2018-07-30 NOTE — ED Notes (Signed)
Unable to access peripheral multiple times, IV team consult requested .

## 2018-07-30 NOTE — ED Triage Notes (Signed)
Pt reports left sided abd pain for the past couple days. Pt was seen at PCP, had an xray and was told he has a bowel obstruction. Pt reports 2 vomiting episodes. Pt a.o, nad noted. Pt is dialysis pt, T, TH, SAT

## 2018-07-30 NOTE — ED Provider Notes (Signed)
[facility] EMERGENCY DEPARTMENT Provider Note   CSN: [id number] Arrival date & time: 07/30/18  1911     History   Chief Complaint Chief Complaint  Patient presents with  . Abdominal Pain    HPI Andrew Brandt is a 68 y.o. male.  HPI Patient presented to the emergency room for abdominal pain and possible bowel obstruction.  Patient states he started having multiple episodes of diarrhea today.  Then started having cramping abdominal pain mostly in the left lower side.  Patient asked to go see his doctor.  He went to his doctor's office today and they did some x-rays.  Findings were concerning for the possibility of bowel obstruction.  Patient was told to come to the emergency room.  Patient denies any vomiting but he has been having odorous burping.  He is currently not having abdominal pain but it does come and go in waves.  He does feel like his abdomen is more distended. Past Medical History:  Diagnosis Date  . Cancer [facility]) 2017   prostate cancer  . Chronic kidney disease    Dialysis Tues-Thurs.[facility] [address]  . Constipation   . Headache    occ  . Hypertension   . Neck pain    pulled muscle in neck on cyclobenzaprine prn    Patient Active Problem List   Diagnosis Date Noted  . End stage renal disease ([place]) 01/05/2014  . Thrombocytopenia, unspecified ([place]) 11/25/2013  . ESRD (end stage renal disease) ([place]) 11/20/2013  . Normocytic anemia 11/20/2013  . AKI (acute kidney injury) ([place]) 11/19/2013  . HTN (hypertension) 11/19/2013    Past Surgical History:  Procedure Laterality Date  . AV FISTULA PLACEMENT Left 11/23/2013   Procedure: ARTERIOVENOUS (AV) FISTULA CREATION VS. GRAFT;  Surgeon: Rosetta Posner, MD;  Location: [facility] OR;  Service: Vascular;  Laterality: Left;  . AV FISTULA PLACEMENT Left 07/12/2017   Procedure: INSERTION OF ARTERIOVENOUS (AV) GORE-TEX GRAFT ARM LEFT UPPER ARM;  Surgeon: Elam Dutch, MD;  Location: [facility] OR;   Service: Vascular;  Laterality: Left;  . AV FISTULA PLACEMENT Right 10/21/2017   Procedure: ARTERIOVENOUS ARM (AV) FISTULA CREATION;  Surgeon: Elam Dutch, MD;  Location: [place];  Service: Vascular;  Laterality: Right;  . COLONOSCOPY WITH PROPOFOL N/A 03/25/2015   Procedure: COLONOSCOPY WITH PROPOFOL;  Surgeon: Carol Ada, MD;  Location: [facility];  Service: Endoscopy;  Laterality: N/A;  . COLONOSCOPY WITH PROPOFOL N/A 05/09/2018   Procedure: COLONOSCOPY WITH PROPOFOL;  Surgeon: Carol Ada, MD;  Location: [facility];  Service: Endoscopy;  Laterality: N/A;  aborted due to prep  . INSERTION OF DIALYSIS CATHETER N/A 11/23/2013   Procedure: INSERTION OF DIALYSIS CATHETER RIGHT INTERNAL JUGULAR VEIN;  Surgeon: Rosetta Posner, MD;  Location: [place];  Service: Vascular;  Laterality: N/A;  . PROSTATECTOMY    . THROMBECTOMY AND REVISION OF ARTERIOVENTOUS (AV) GORETEX  GRAFT Left 09/02/2017   Procedure: THROMBECTOMY  OF Left Arm  ARTERIOVENTOUS (AV) GORETEX  GRAFT;  Surgeon: Angelia Mould, MD;  Location: [place];  Service: Vascular;  Laterality: Left;        Home Medications    Prior to Admission medications   Medication Sig Start Date End Date Taking? Authorizing Provider  acetaminophen (TYLENOL) 500 MG tablet Take 1,000 mg by mouth every 6 (six) hours as needed for mild pain or headache.    Yes [provider]  b complex-vitamin c-folic acid (NEPHRO-VITE) 0.8 MG TABS tablet Take  1 tablet by mouth daily.   Yes [provider]  VELPHORO 500 MG chewable tablet Chew 500-1,000 mg by mouth See admin instructions. Take 2 tablets (1000 mg) by mouth with meals & take 1 tablet (500 mg) by mouth with snacks. 03/11/18  Yes [provider]    Family History Family History  Problem Relation Age of Onset  . Cancer Mother   . Diabetes Other        grandparents    Social History Social History   Tobacco Use  . Smoking status: Former Smoker    Types: Cigarettes  .  Smokeless tobacco: Never Used  . Tobacco comment: quit smoking cigaretees > 40 years  Substance Use Topics  . Alcohol use: Yes    Comment: Occ. beer  . Drug use: No     Allergies   Patient has no known allergies.   Review of Systems Review of Systems  All other systems reviewed and are negative.    Physical Exam Updated Vital Signs BP (!) 147/83 (BP Location: Left Arm)   Pulse 82   Temp 98.2 F (36.8 C) (Oral)   Resp 16   SpO2 96%   Physical Exam Vitals signs and nursing note reviewed.  Constitutional:      General: He is not in acute distress.    Appearance: He is well-developed.  HENT:     Head: Normocephalic and atraumatic.     Right Ear: External ear normal.     Left Ear: External ear normal.  Eyes:     General: No scleral icterus.       Right eye: No discharge.        Left eye: No discharge.     Conjunctiva/sclera: Conjunctivae normal.  Neck:     Musculoskeletal: Neck supple.     Trachea: No tracheal deviation.  Cardiovascular:     Rate and Rhythm: Normal rate and regular rhythm.  Pulmonary:     Effort: Pulmonary effort is normal. No respiratory distress.     Breath sounds: Normal breath sounds. No stridor. No wheezing or rales.  Abdominal:     General: Bowel sounds are increased. There is distension.     Palpations: Abdomen is soft.     Tenderness: There is no abdominal tenderness. There is no guarding or rebound.  Musculoskeletal:        General: No tenderness.     Comments: Fistula right upper extremity  Skin:    General: Skin is warm and dry.     Findings: No rash.  Neurological:     Mental Status: He is alert.     Cranial Nerves: No cranial nerve deficit (no facial droop, extraocular movements intact, no slurred speech).     Sensory: No sensory deficit.     Motor: No abnormal muscle tone or seizure activity.     Coordination: Coordination normal.      ED Treatments / Results  Labs (all labs ordered are listed, but only abnormal results  are displayed) Labs Reviewed  COMPREHENSIVE METABOLIC PANEL - Abnormal; Notable for the following components:      Result Value   Potassium 2.9 (*)    Chloride 97 (*)    BUN 25 (*)    Creatinine, Ser 8.73 (*)    GFR calc non Af Amer 6 (*)    GFR calc Af Amer 6 (*)    Anion gap 18 (*)    All other components within normal limits  CBC - Abnormal; Notable  for the following components:   RBC 3.61 (*)    Hemoglobin 11.0 (*)    HCT 35.3 (*)    RDW 16.8 (*)    Platelets 140 (*)    All other components within normal limits  LIPASE, BLOOD     Procedures Procedures (including critical care time)  Medications Ordered in ED Medications - No data to display   Initial Impression / Assessment and Plan / ED Course  I have reviewed the triage vital signs and the nursing notes.  Pertinent labs & imaging results that were available during my care of the patient were reviewed by me and considered in my medical decision making (see chart for details).  Clinical Course as of Jul 31 2355  Wed Jul 30, 2018  2147 Labs reviewed.   [JK]  2147 Hemoglobin similar to several months ago   [JK]  6948 Metabolic panel consistent with his chronic renal failure   [JK]    Clinical Course User Index [JK] Dorie Rank, MD    Pt presents with abd pain, diarrhea.  outpt xrays concerning for obstruction.  Plan on CT scan.  Dr Roxanne Mins for follow up on result.  Final Clinical Impressions(s) / ED Diagnoses  pending   Dorie Rank, MD 07/30/18 2357

## 2018-07-31 ENCOUNTER — Emergency Department (HOSPITAL_COMMUNITY): Payer: Medicare Other

## 2018-07-31 DIAGNOSIS — Z992 Dependence on renal dialysis: Secondary | ICD-10-CM | POA: Diagnosis not present

## 2018-07-31 DIAGNOSIS — K5900 Constipation, unspecified: Secondary | ICD-10-CM | POA: Diagnosis not present

## 2018-07-31 DIAGNOSIS — N186 End stage renal disease: Secondary | ICD-10-CM | POA: Diagnosis not present

## 2018-07-31 DIAGNOSIS — N2581 Secondary hyperparathyroidism of renal origin: Secondary | ICD-10-CM | POA: Diagnosis not present

## 2018-07-31 DIAGNOSIS — K5730 Diverticulosis of large intestine without perforation or abscess without bleeding: Secondary | ICD-10-CM | POA: Diagnosis not present

## 2018-07-31 DIAGNOSIS — D631 Anemia in chronic kidney disease: Secondary | ICD-10-CM | POA: Diagnosis not present

## 2018-07-31 MED ORDER — ONDANSETRON HCL 4 MG/2ML IJ SOLN
4.0000 mg | Freq: Once | INTRAMUSCULAR | Status: AC
  Administered 2018-07-31: 4 mg via INTRAVENOUS

## 2018-07-31 MED ORDER — POTASSIUM CHLORIDE CRYS ER 20 MEQ PO TBCR
40.0000 meq | EXTENDED_RELEASE_TABLET | Freq: Once | ORAL | Status: DC
  Filled 2018-07-31: qty 2

## 2018-07-31 MED ORDER — POTASSIUM CHLORIDE CRYS ER 20 MEQ PO TBCR: 20.0000 meq | EXTENDED_RELEASE_TABLET | Freq: Two times a day (BID) | ORAL | 0 refills | Status: DC

## 2018-07-31 MED ORDER — PEG 3350-KCL-NABCB-NACL-NASULF 236 G PO SOLR: 4.0000 L | Freq: Once | ORAL | 0 refills | Status: AC

## 2018-07-31 NOTE — Discharge Instructions (Signed)
Return if distension gets worse, if abdominal pain gets worse, or if you start vomiting.

## 2018-07-31 NOTE — ED Notes (Signed)
EDP notified that pt. is vomitting at this time /unable to swallow Pottasium pills .

## 2018-07-31 NOTE — ED Provider Notes (Signed)
Care assumed from Dr. Tomi Bamberger, patient awaiting CT scan to rule out bowel obstruction.  CT scan does show some colonic distention but not to a critical degree, some hard stool noted on CT scan.  He has been passing stool at home.  He continues to stay quite comfortable.  Review of labs does show significant hypokalemia and is given oral potassium.  He is discharged with prescription for K-Dur and GoLYTELY, follow-up with PCP.  Return precautions discussed.  Results for orders placed or performed during the hospital encounter of 07/30/18  Lipase, blood  Result Value Ref Range   Lipase 25 11 - 51 U/L  Comprehensive metabolic panel  Result Value Ref Range   Sodium 140 135 - 145 mmol/L   Potassium 2.9 (L) 3.5 - 5.1 mmol/L   Chloride 97 (L) 98 - 111 mmol/L   CO2 25 22 - 32 mmol/L   Glucose, Bld 83 70 - 99 mg/dL   BUN 25 (H) 8 - 23 mg/dL   Creatinine, Ser 8.73 (H) 0.61 - 1.24 mg/dL   Calcium 10.1 8.9 - 10.3 mg/dL   Total Protein 7.4 6.5 - 8.1 g/dL   Albumin 4.0 3.5 - 5.0 g/dL   AST 18 15 - 41 U/L   ALT 10 0 - 44 U/L   Alkaline Phosphatase 39 38 - 126 U/L   Total Bilirubin 1.0 0.3 - 1.2 mg/dL   GFR calc non Af Amer 6 (L) >60 mL/min   GFR calc Af Amer 6 (L) >60 mL/min   Anion gap 18 (H) 5 - 15  CBC  Result Value Ref Range   WBC 5.1 4.0 - 10.5 K/uL   RBC 3.61 (L) 4.22 - 5.81 MIL/uL   Hemoglobin 11.0 (L) 13.0 - 17.0 g/dL   HCT 35.3 (L) 39.0 - 52.0 %   MCV 97.8 80.0 - 100.0 fL   MCH 30.5 26.0 - 34.0 pg   MCHC 31.2 30.0 - 36.0 g/dL   RDW 16.8 (H) 11.5 - 15.5 %   Platelets 140 (L) 150 - 400 K/uL   nRBC 0.0 0.0 - 0.2 %   Ct Abdomen Pelvis Wo Contrast  Result Date: 07/31/2018 CLINICAL DATA:  Bowel obstruction, high-grade. Left-sided abdominal pain for 2 days. Bowel obstruction diagnosed at primary care physician. EXAM: CT ABDOMEN AND PELVIS WITHOUT CONTRAST TECHNIQUE: Multidetector CT imaging of the abdomen and pelvis was performed following the standard protocol without IV COMPARISON:  None.  FINDINGS: Lower chest: Bibasilar atelectasis, left greater than right. No pleural fluid. Hepatobiliary: There is a 4 cm cyst in the posterior right lobe of the liver. Additional smaller hypodense lesion in the right hepatic dome is too small to characterize. Gallbladder is physiologically distended. No calcified gallstone. No biliary dilatation. Pancreas: No ductal dilatation or inflammation. Parenchymal atrophy. Spleen: Normal in size without focal abnormality. Adrenals/Urinary Tract: No adrenal nodule. Atrophic kidneys with cystic replacement. No hydronephrosis or perinephric edema. Urinary bladder completely empty. Stomach/Bowel: Stomach is physiologically distended. Administered enteric contrast reaches the mid small bowel. There is no small bowel dilatation. The appendix is normal. Diffuse colonic distension with air and stool. There is significant colonic tortuosity, particularly the sigmoid. No evidence of mesenteric twisting or volvulus rectum is distended with stool ball and inspissated spanning 8.8 cm with rectal wall thickening and mild perirectal edema. No evidence of pneumatosis. Colonic diverticulosis of the descending and sigmoid colon without diverticulitis. Vascular/Lymphatic: Aortic and branch atherosclerosis without aneurysm. No enlarged lymph nodes in the abdomen or pelvis. Reproductive: Post  prostatectomy. Other: Small volume of free fluid in the right upper quadrant and left pelvis. Mild edema of pelvic fat. No free air or intra-abdominal abscess. Musculoskeletal: Multilevel degenerative change in the spine. The bones are under mineralized. There are no acute or suspicious osseous abnormalities. IMPRESSION: 1. Colonic distension and tortuosity with stool and air throughout the colon. A stool ball is in the rectum with rectal distention and associated wall thickening. Findings suggesting fecal impaction with total colonic ileus or Ogilvie syndrome. There is no small bowel dilatation or small  bowel obstruction. 2. Distal colonic diverticulosis without diverticulitis. 3. Chronic renal disease with renal atrophy and cystic replacement. 4.  Aortic Atherosclerosis (ICD10-I70.0). Electronically Signed   By: Keith Rake M.D.   On: 07/31/2018 01:14   Images viewed by me.   Delora Fuel, MD 72/90/21 [facility] [place]

## 2018-08-02 DIAGNOSIS — N2581 Secondary hyperparathyroidism of renal origin: Secondary | ICD-10-CM | POA: Diagnosis not present

## 2018-08-02 DIAGNOSIS — D631 Anemia in chronic kidney disease: Secondary | ICD-10-CM | POA: Diagnosis not present

## 2018-08-02 DIAGNOSIS — N186 End stage renal disease: Secondary | ICD-10-CM | POA: Diagnosis not present

## 2018-08-02 DIAGNOSIS — Z992 Dependence on renal dialysis: Secondary | ICD-10-CM | POA: Diagnosis not present

## 2018-08-05 DIAGNOSIS — D631 Anemia in chronic kidney disease: Secondary | ICD-10-CM | POA: Diagnosis not present

## 2018-08-05 DIAGNOSIS — Z992 Dependence on renal dialysis: Secondary | ICD-10-CM | POA: Diagnosis not present

## 2018-08-05 DIAGNOSIS — N2581 Secondary hyperparathyroidism of renal origin: Secondary | ICD-10-CM | POA: Diagnosis not present

## 2018-08-05 DIAGNOSIS — N186 End stage renal disease: Secondary | ICD-10-CM | POA: Diagnosis not present

## 2018-08-07 DIAGNOSIS — D631 Anemia in chronic kidney disease: Secondary | ICD-10-CM | POA: Diagnosis not present

## 2018-08-07 DIAGNOSIS — N186 End stage renal disease: Secondary | ICD-10-CM | POA: Diagnosis not present

## 2018-08-07 DIAGNOSIS — N2581 Secondary hyperparathyroidism of renal origin: Secondary | ICD-10-CM | POA: Diagnosis not present

## 2018-08-07 DIAGNOSIS — Z992 Dependence on renal dialysis: Secondary | ICD-10-CM | POA: Diagnosis not present

## 2018-08-09 DIAGNOSIS — D631 Anemia in chronic kidney disease: Secondary | ICD-10-CM | POA: Diagnosis not present

## 2018-08-09 DIAGNOSIS — N186 End stage renal disease: Secondary | ICD-10-CM | POA: Diagnosis not present

## 2018-08-09 DIAGNOSIS — Z992 Dependence on renal dialysis: Secondary | ICD-10-CM | POA: Diagnosis not present

## 2018-08-09 DIAGNOSIS — N2581 Secondary hyperparathyroidism of renal origin: Secondary | ICD-10-CM | POA: Diagnosis not present

## 2018-08-12 DIAGNOSIS — N186 End stage renal disease: Secondary | ICD-10-CM | POA: Diagnosis not present

## 2018-08-12 DIAGNOSIS — N2581 Secondary hyperparathyroidism of renal origin: Secondary | ICD-10-CM | POA: Diagnosis not present

## 2018-08-12 DIAGNOSIS — Z992 Dependence on renal dialysis: Secondary | ICD-10-CM | POA: Diagnosis not present

## 2018-08-12 DIAGNOSIS — D631 Anemia in chronic kidney disease: Secondary | ICD-10-CM | POA: Diagnosis not present

## 2018-08-13 DIAGNOSIS — N400 Enlarged prostate without lower urinary tract symptoms: Secondary | ICD-10-CM | POA: Diagnosis not present

## 2018-08-13 DIAGNOSIS — I739 Peripheral vascular disease, unspecified: Secondary | ICD-10-CM | POA: Diagnosis not present

## 2018-08-13 DIAGNOSIS — N529 Male erectile dysfunction, unspecified: Secondary | ICD-10-CM | POA: Diagnosis not present

## 2018-08-13 DIAGNOSIS — N184 Chronic kidney disease, stage 4 (severe): Secondary | ICD-10-CM | POA: Diagnosis not present

## 2018-08-13 DIAGNOSIS — K581 Irritable bowel syndrome with constipation: Secondary | ICD-10-CM | POA: Diagnosis not present

## 2018-08-13 DIAGNOSIS — D638 Anemia in other chronic diseases classified elsewhere: Secondary | ICD-10-CM | POA: Diagnosis not present

## 2018-08-13 DIAGNOSIS — I10 Essential (primary) hypertension: Secondary | ICD-10-CM | POA: Diagnosis not present

## 2018-08-13 DIAGNOSIS — M62838 Other muscle spasm: Secondary | ICD-10-CM | POA: Diagnosis not present

## 2018-08-14 DIAGNOSIS — N186 End stage renal disease: Secondary | ICD-10-CM | POA: Diagnosis not present

## 2018-08-14 DIAGNOSIS — N2581 Secondary hyperparathyroidism of renal origin: Secondary | ICD-10-CM | POA: Diagnosis not present

## 2018-08-14 DIAGNOSIS — Z992 Dependence on renal dialysis: Secondary | ICD-10-CM | POA: Diagnosis not present

## 2018-08-14 DIAGNOSIS — D631 Anemia in chronic kidney disease: Secondary | ICD-10-CM | POA: Diagnosis not present

## 2018-08-16 DIAGNOSIS — N186 End stage renal disease: Secondary | ICD-10-CM | POA: Diagnosis not present

## 2018-08-16 DIAGNOSIS — D631 Anemia in chronic kidney disease: Secondary | ICD-10-CM | POA: Diagnosis not present

## 2018-08-16 DIAGNOSIS — N2581 Secondary hyperparathyroidism of renal origin: Secondary | ICD-10-CM | POA: Diagnosis not present

## 2018-08-16 DIAGNOSIS — Z992 Dependence on renal dialysis: Secondary | ICD-10-CM | POA: Diagnosis not present

## 2018-08-19 DIAGNOSIS — D631 Anemia in chronic kidney disease: Secondary | ICD-10-CM | POA: Diagnosis not present

## 2018-08-19 DIAGNOSIS — N186 End stage renal disease: Secondary | ICD-10-CM | POA: Diagnosis not present

## 2018-08-19 DIAGNOSIS — N2581 Secondary hyperparathyroidism of renal origin: Secondary | ICD-10-CM | POA: Diagnosis not present

## 2018-08-19 DIAGNOSIS — Z992 Dependence on renal dialysis: Secondary | ICD-10-CM | POA: Diagnosis not present

## 2018-08-21 DIAGNOSIS — D631 Anemia in chronic kidney disease: Secondary | ICD-10-CM | POA: Diagnosis not present

## 2018-08-21 DIAGNOSIS — Z992 Dependence on renal dialysis: Secondary | ICD-10-CM | POA: Diagnosis not present

## 2018-08-21 DIAGNOSIS — N2581 Secondary hyperparathyroidism of renal origin: Secondary | ICD-10-CM | POA: Diagnosis not present

## 2018-08-21 DIAGNOSIS — N186 End stage renal disease: Secondary | ICD-10-CM | POA: Diagnosis not present

## 2018-08-23 DIAGNOSIS — N186 End stage renal disease: Secondary | ICD-10-CM | POA: Diagnosis not present

## 2018-08-23 DIAGNOSIS — N2581 Secondary hyperparathyroidism of renal origin: Secondary | ICD-10-CM | POA: Diagnosis not present

## 2018-08-23 DIAGNOSIS — I120 Hypertensive chronic kidney disease with stage 5 chronic kidney disease or end stage renal disease: Secondary | ICD-10-CM | POA: Diagnosis not present

## 2018-08-23 DIAGNOSIS — Z992 Dependence on renal dialysis: Secondary | ICD-10-CM | POA: Diagnosis not present

## 2018-08-23 DIAGNOSIS — D631 Anemia in chronic kidney disease: Secondary | ICD-10-CM | POA: Diagnosis not present

## 2018-08-26 DIAGNOSIS — N186 End stage renal disease: Secondary | ICD-10-CM | POA: Diagnosis not present

## 2018-08-26 DIAGNOSIS — D631 Anemia in chronic kidney disease: Secondary | ICD-10-CM | POA: Diagnosis not present

## 2018-08-26 DIAGNOSIS — N2581 Secondary hyperparathyroidism of renal origin: Secondary | ICD-10-CM | POA: Diagnosis not present

## 2018-08-28 DIAGNOSIS — N186 End stage renal disease: Secondary | ICD-10-CM | POA: Diagnosis not present

## 2018-08-28 DIAGNOSIS — N2581 Secondary hyperparathyroidism of renal origin: Secondary | ICD-10-CM | POA: Diagnosis not present

## 2018-08-28 DIAGNOSIS — D631 Anemia in chronic kidney disease: Secondary | ICD-10-CM | POA: Diagnosis not present

## 2018-08-30 DIAGNOSIS — D631 Anemia in chronic kidney disease: Secondary | ICD-10-CM | POA: Diagnosis not present

## 2018-08-30 DIAGNOSIS — N2581 Secondary hyperparathyroidism of renal origin: Secondary | ICD-10-CM | POA: Diagnosis not present

## 2018-08-30 DIAGNOSIS — N186 End stage renal disease: Secondary | ICD-10-CM | POA: Diagnosis not present

## 2018-09-02 DIAGNOSIS — N2581 Secondary hyperparathyroidism of renal origin: Secondary | ICD-10-CM | POA: Diagnosis not present

## 2018-09-02 DIAGNOSIS — D631 Anemia in chronic kidney disease: Secondary | ICD-10-CM | POA: Diagnosis not present

## 2018-09-02 DIAGNOSIS — N186 End stage renal disease: Secondary | ICD-10-CM | POA: Diagnosis not present

## 2018-09-04 DIAGNOSIS — D631 Anemia in chronic kidney disease: Secondary | ICD-10-CM | POA: Diagnosis not present

## 2018-09-04 DIAGNOSIS — N186 End stage renal disease: Secondary | ICD-10-CM | POA: Diagnosis not present

## 2018-09-04 DIAGNOSIS — N2581 Secondary hyperparathyroidism of renal origin: Secondary | ICD-10-CM | POA: Diagnosis not present

## 2018-09-06 DIAGNOSIS — N2581 Secondary hyperparathyroidism of renal origin: Secondary | ICD-10-CM | POA: Diagnosis not present

## 2018-09-06 DIAGNOSIS — D631 Anemia in chronic kidney disease: Secondary | ICD-10-CM | POA: Diagnosis not present

## 2018-09-06 DIAGNOSIS — N186 End stage renal disease: Secondary | ICD-10-CM | POA: Diagnosis not present

## 2018-09-09 DIAGNOSIS — N186 End stage renal disease: Secondary | ICD-10-CM | POA: Diagnosis not present

## 2018-09-09 DIAGNOSIS — D631 Anemia in chronic kidney disease: Secondary | ICD-10-CM | POA: Diagnosis not present

## 2018-09-09 DIAGNOSIS — N2581 Secondary hyperparathyroidism of renal origin: Secondary | ICD-10-CM | POA: Diagnosis not present

## 2018-09-11 DIAGNOSIS — N2581 Secondary hyperparathyroidism of renal origin: Secondary | ICD-10-CM | POA: Diagnosis not present

## 2018-09-11 DIAGNOSIS — N186 End stage renal disease: Secondary | ICD-10-CM | POA: Diagnosis not present

## 2018-09-11 DIAGNOSIS — D631 Anemia in chronic kidney disease: Secondary | ICD-10-CM | POA: Diagnosis not present

## 2018-09-13 DIAGNOSIS — D631 Anemia in chronic kidney disease: Secondary | ICD-10-CM | POA: Diagnosis not present

## 2018-09-13 DIAGNOSIS — N2581 Secondary hyperparathyroidism of renal origin: Secondary | ICD-10-CM | POA: Diagnosis not present

## 2018-09-13 DIAGNOSIS — N186 End stage renal disease: Secondary | ICD-10-CM | POA: Diagnosis not present

## 2018-09-16 DIAGNOSIS — N2581 Secondary hyperparathyroidism of renal origin: Secondary | ICD-10-CM | POA: Diagnosis not present

## 2018-09-16 DIAGNOSIS — D631 Anemia in chronic kidney disease: Secondary | ICD-10-CM | POA: Diagnosis not present

## 2018-09-16 DIAGNOSIS — N186 End stage renal disease: Secondary | ICD-10-CM | POA: Diagnosis not present

## 2018-09-18 DIAGNOSIS — N186 End stage renal disease: Secondary | ICD-10-CM | POA: Diagnosis not present

## 2018-09-18 DIAGNOSIS — D631 Anemia in chronic kidney disease: Secondary | ICD-10-CM | POA: Diagnosis not present

## 2018-09-18 DIAGNOSIS — N2581 Secondary hyperparathyroidism of renal origin: Secondary | ICD-10-CM | POA: Diagnosis not present

## 2018-09-20 DIAGNOSIS — N186 End stage renal disease: Secondary | ICD-10-CM | POA: Diagnosis not present

## 2018-09-20 DIAGNOSIS — D631 Anemia in chronic kidney disease: Secondary | ICD-10-CM | POA: Diagnosis not present

## 2018-09-20 DIAGNOSIS — N2581 Secondary hyperparathyroidism of renal origin: Secondary | ICD-10-CM | POA: Diagnosis not present

## 2018-09-23 DIAGNOSIS — N186 End stage renal disease: Secondary | ICD-10-CM | POA: Diagnosis not present

## 2018-09-23 DIAGNOSIS — D631 Anemia in chronic kidney disease: Secondary | ICD-10-CM | POA: Diagnosis not present

## 2018-09-23 DIAGNOSIS — N2581 Secondary hyperparathyroidism of renal origin: Secondary | ICD-10-CM | POA: Diagnosis not present

## 2018-09-25 DIAGNOSIS — N186 End stage renal disease: Secondary | ICD-10-CM | POA: Diagnosis not present

## 2018-09-25 DIAGNOSIS — D631 Anemia in chronic kidney disease: Secondary | ICD-10-CM | POA: Diagnosis not present

## 2018-09-25 DIAGNOSIS — N2581 Secondary hyperparathyroidism of renal origin: Secondary | ICD-10-CM | POA: Diagnosis not present

## 2018-09-27 DIAGNOSIS — N186 End stage renal disease: Secondary | ICD-10-CM | POA: Diagnosis not present

## 2018-09-27 DIAGNOSIS — N2581 Secondary hyperparathyroidism of renal origin: Secondary | ICD-10-CM | POA: Diagnosis not present

## 2018-09-27 DIAGNOSIS — D631 Anemia in chronic kidney disease: Secondary | ICD-10-CM | POA: Diagnosis not present

## 2018-09-30 DIAGNOSIS — D631 Anemia in chronic kidney disease: Secondary | ICD-10-CM | POA: Diagnosis not present

## 2018-09-30 DIAGNOSIS — N186 End stage renal disease: Secondary | ICD-10-CM | POA: Diagnosis not present

## 2018-09-30 DIAGNOSIS — N2581 Secondary hyperparathyroidism of renal origin: Secondary | ICD-10-CM | POA: Diagnosis not present

## 2018-10-02 DIAGNOSIS — D631 Anemia in chronic kidney disease: Secondary | ICD-10-CM | POA: Diagnosis not present

## 2018-10-02 DIAGNOSIS — N2581 Secondary hyperparathyroidism of renal origin: Secondary | ICD-10-CM | POA: Diagnosis not present

## 2018-10-02 DIAGNOSIS — N186 End stage renal disease: Secondary | ICD-10-CM | POA: Diagnosis not present

## 2018-10-04 DIAGNOSIS — D631 Anemia in chronic kidney disease: Secondary | ICD-10-CM | POA: Diagnosis not present

## 2018-10-04 DIAGNOSIS — N2581 Secondary hyperparathyroidism of renal origin: Secondary | ICD-10-CM | POA: Diagnosis not present

## 2018-10-04 DIAGNOSIS — N186 End stage renal disease: Secondary | ICD-10-CM | POA: Diagnosis not present

## 2018-10-06 DIAGNOSIS — N184 Chronic kidney disease, stage 4 (severe): Secondary | ICD-10-CM | POA: Diagnosis not present

## 2018-10-06 DIAGNOSIS — I739 Peripheral vascular disease, unspecified: Secondary | ICD-10-CM | POA: Diagnosis not present

## 2018-10-06 DIAGNOSIS — K581 Irritable bowel syndrome with constipation: Secondary | ICD-10-CM | POA: Diagnosis not present

## 2018-10-06 DIAGNOSIS — M62838 Other muscle spasm: Secondary | ICD-10-CM | POA: Diagnosis not present

## 2018-10-06 DIAGNOSIS — I10 Essential (primary) hypertension: Secondary | ICD-10-CM | POA: Diagnosis not present

## 2018-10-06 DIAGNOSIS — N529 Male erectile dysfunction, unspecified: Secondary | ICD-10-CM | POA: Diagnosis not present

## 2018-10-06 DIAGNOSIS — N400 Enlarged prostate without lower urinary tract symptoms: Secondary | ICD-10-CM | POA: Diagnosis not present

## 2018-10-06 DIAGNOSIS — R05 Cough: Secondary | ICD-10-CM | POA: Diagnosis not present

## 2018-10-06 DIAGNOSIS — D638 Anemia in other chronic diseases classified elsewhere: Secondary | ICD-10-CM | POA: Diagnosis not present

## 2018-10-07 DIAGNOSIS — N186 End stage renal disease: Secondary | ICD-10-CM | POA: Diagnosis not present

## 2018-10-07 DIAGNOSIS — N2581 Secondary hyperparathyroidism of renal origin: Secondary | ICD-10-CM | POA: Diagnosis not present

## 2018-10-07 DIAGNOSIS — D631 Anemia in chronic kidney disease: Secondary | ICD-10-CM | POA: Diagnosis not present

## 2018-10-09 DIAGNOSIS — D631 Anemia in chronic kidney disease: Secondary | ICD-10-CM | POA: Diagnosis not present

## 2018-10-09 DIAGNOSIS — N2581 Secondary hyperparathyroidism of renal origin: Secondary | ICD-10-CM | POA: Diagnosis not present

## 2018-10-09 DIAGNOSIS — N186 End stage renal disease: Secondary | ICD-10-CM | POA: Diagnosis not present

## 2018-10-11 DIAGNOSIS — N186 End stage renal disease: Secondary | ICD-10-CM | POA: Diagnosis not present

## 2018-10-11 DIAGNOSIS — D631 Anemia in chronic kidney disease: Secondary | ICD-10-CM | POA: Diagnosis not present

## 2018-10-11 DIAGNOSIS — N2581 Secondary hyperparathyroidism of renal origin: Secondary | ICD-10-CM | POA: Diagnosis not present

## 2018-10-14 DIAGNOSIS — N186 End stage renal disease: Secondary | ICD-10-CM | POA: Diagnosis not present

## 2018-10-14 DIAGNOSIS — D631 Anemia in chronic kidney disease: Secondary | ICD-10-CM | POA: Diagnosis not present

## 2018-10-14 DIAGNOSIS — N2581 Secondary hyperparathyroidism of renal origin: Secondary | ICD-10-CM | POA: Diagnosis not present

## 2018-10-16 DIAGNOSIS — N186 End stage renal disease: Secondary | ICD-10-CM | POA: Diagnosis not present

## 2018-10-16 DIAGNOSIS — D631 Anemia in chronic kidney disease: Secondary | ICD-10-CM | POA: Diagnosis not present

## 2018-10-16 DIAGNOSIS — N2581 Secondary hyperparathyroidism of renal origin: Secondary | ICD-10-CM | POA: Diagnosis not present

## 2018-10-18 DIAGNOSIS — D631 Anemia in chronic kidney disease: Secondary | ICD-10-CM | POA: Diagnosis not present

## 2018-10-18 DIAGNOSIS — N186 End stage renal disease: Secondary | ICD-10-CM | POA: Diagnosis not present

## 2018-10-18 DIAGNOSIS — N2581 Secondary hyperparathyroidism of renal origin: Secondary | ICD-10-CM | POA: Diagnosis not present

## 2018-10-21 DIAGNOSIS — N186 End stage renal disease: Secondary | ICD-10-CM | POA: Diagnosis not present

## 2018-10-21 DIAGNOSIS — D631 Anemia in chronic kidney disease: Secondary | ICD-10-CM | POA: Diagnosis not present

## 2018-10-21 DIAGNOSIS — N2581 Secondary hyperparathyroidism of renal origin: Secondary | ICD-10-CM | POA: Diagnosis not present

## 2018-10-22 DIAGNOSIS — N186 End stage renal disease: Secondary | ICD-10-CM | POA: Diagnosis not present

## 2018-10-22 DIAGNOSIS — Z992 Dependence on renal dialysis: Secondary | ICD-10-CM | POA: Diagnosis not present

## 2018-10-22 DIAGNOSIS — I120 Hypertensive chronic kidney disease with stage 5 chronic kidney disease or end stage renal disease: Secondary | ICD-10-CM | POA: Diagnosis not present

## 2018-10-23 DIAGNOSIS — N2581 Secondary hyperparathyroidism of renal origin: Secondary | ICD-10-CM | POA: Diagnosis not present

## 2018-10-23 DIAGNOSIS — N186 End stage renal disease: Secondary | ICD-10-CM | POA: Diagnosis not present

## 2018-10-25 DIAGNOSIS — N186 End stage renal disease: Secondary | ICD-10-CM | POA: Diagnosis not present

## 2018-10-25 DIAGNOSIS — N2581 Secondary hyperparathyroidism of renal origin: Secondary | ICD-10-CM | POA: Diagnosis not present

## 2018-10-28 DIAGNOSIS — N186 End stage renal disease: Secondary | ICD-10-CM | POA: Diagnosis not present

## 2018-10-28 DIAGNOSIS — N2581 Secondary hyperparathyroidism of renal origin: Secondary | ICD-10-CM | POA: Diagnosis not present

## 2018-10-29 DIAGNOSIS — Z01021 Encounter for examination of eyes and vision following failed vision screening with abnormal findings: Secondary | ICD-10-CM | POA: Diagnosis not present

## 2018-10-29 DIAGNOSIS — Z136 Encounter for screening for cardiovascular disorders: Secondary | ICD-10-CM | POA: Diagnosis not present

## 2018-10-29 DIAGNOSIS — N529 Male erectile dysfunction, unspecified: Secondary | ICD-10-CM | POA: Diagnosis not present

## 2018-10-29 DIAGNOSIS — Z01118 Encounter for examination of ears and hearing with other abnormal findings: Secondary | ICD-10-CM | POA: Diagnosis not present

## 2018-10-29 DIAGNOSIS — K581 Irritable bowel syndrome with constipation: Secondary | ICD-10-CM | POA: Diagnosis not present

## 2018-10-29 DIAGNOSIS — Z0101 Encounter for examination of eyes and vision with abnormal findings: Secondary | ICD-10-CM | POA: Diagnosis not present

## 2018-10-29 DIAGNOSIS — Z1329 Encounter for screening for other suspected endocrine disorder: Secondary | ICD-10-CM | POA: Diagnosis not present

## 2018-10-29 DIAGNOSIS — N400 Enlarged prostate without lower urinary tract symptoms: Secondary | ICD-10-CM | POA: Diagnosis not present

## 2018-10-29 DIAGNOSIS — D638 Anemia in other chronic diseases classified elsewhere: Secondary | ICD-10-CM | POA: Diagnosis not present

## 2018-10-29 DIAGNOSIS — I10 Essential (primary) hypertension: Secondary | ICD-10-CM | POA: Diagnosis not present

## 2018-10-29 DIAGNOSIS — Z1389 Encounter for screening for other disorder: Secondary | ICD-10-CM | POA: Diagnosis not present

## 2018-10-29 DIAGNOSIS — N184 Chronic kidney disease, stage 4 (severe): Secondary | ICD-10-CM | POA: Diagnosis not present

## 2018-10-29 DIAGNOSIS — Z5181 Encounter for therapeutic drug level monitoring: Secondary | ICD-10-CM | POA: Diagnosis not present

## 2018-10-29 DIAGNOSIS — I739 Peripheral vascular disease, unspecified: Secondary | ICD-10-CM | POA: Diagnosis not present

## 2018-10-29 DIAGNOSIS — Z131 Encounter for screening for diabetes mellitus: Secondary | ICD-10-CM | POA: Diagnosis not present

## 2018-10-30 DIAGNOSIS — N2581 Secondary hyperparathyroidism of renal origin: Secondary | ICD-10-CM | POA: Diagnosis not present

## 2018-10-30 DIAGNOSIS — N186 End stage renal disease: Secondary | ICD-10-CM | POA: Diagnosis not present

## 2018-11-01 DIAGNOSIS — N186 End stage renal disease: Secondary | ICD-10-CM | POA: Diagnosis not present

## 2018-11-01 DIAGNOSIS — N2581 Secondary hyperparathyroidism of renal origin: Secondary | ICD-10-CM | POA: Diagnosis not present

## 2018-11-04 DIAGNOSIS — N186 End stage renal disease: Secondary | ICD-10-CM | POA: Diagnosis not present

## 2018-11-04 DIAGNOSIS — N2581 Secondary hyperparathyroidism of renal origin: Secondary | ICD-10-CM | POA: Diagnosis not present

## 2018-11-06 DIAGNOSIS — N186 End stage renal disease: Secondary | ICD-10-CM | POA: Diagnosis not present

## 2018-11-06 DIAGNOSIS — N2581 Secondary hyperparathyroidism of renal origin: Secondary | ICD-10-CM | POA: Diagnosis not present

## 2018-11-08 DIAGNOSIS — N2581 Secondary hyperparathyroidism of renal origin: Secondary | ICD-10-CM | POA: Diagnosis not present

## 2018-11-08 DIAGNOSIS — N186 End stage renal disease: Secondary | ICD-10-CM | POA: Diagnosis not present

## 2018-11-11 DIAGNOSIS — N186 End stage renal disease: Secondary | ICD-10-CM | POA: Diagnosis not present

## 2018-11-11 DIAGNOSIS — N2581 Secondary hyperparathyroidism of renal origin: Secondary | ICD-10-CM | POA: Diagnosis not present

## 2018-11-13 DIAGNOSIS — N2581 Secondary hyperparathyroidism of renal origin: Secondary | ICD-10-CM | POA: Diagnosis not present

## 2018-11-13 DIAGNOSIS — N186 End stage renal disease: Secondary | ICD-10-CM | POA: Diagnosis not present

## 2018-11-15 DIAGNOSIS — N186 End stage renal disease: Secondary | ICD-10-CM | POA: Diagnosis not present

## 2018-11-15 DIAGNOSIS — N2581 Secondary hyperparathyroidism of renal origin: Secondary | ICD-10-CM | POA: Diagnosis not present

## 2018-11-18 DIAGNOSIS — N186 End stage renal disease: Secondary | ICD-10-CM | POA: Diagnosis not present

## 2018-11-18 DIAGNOSIS — N2581 Secondary hyperparathyroidism of renal origin: Secondary | ICD-10-CM | POA: Diagnosis not present

## 2018-11-20 DIAGNOSIS — N186 End stage renal disease: Secondary | ICD-10-CM | POA: Diagnosis not present

## 2018-11-20 DIAGNOSIS — N2581 Secondary hyperparathyroidism of renal origin: Secondary | ICD-10-CM | POA: Diagnosis not present

## 2018-11-21 DIAGNOSIS — Z992 Dependence on renal dialysis: Secondary | ICD-10-CM | POA: Diagnosis not present

## 2018-11-21 DIAGNOSIS — N186 End stage renal disease: Secondary | ICD-10-CM | POA: Diagnosis not present

## 2018-11-21 DIAGNOSIS — I120 Hypertensive chronic kidney disease with stage 5 chronic kidney disease or end stage renal disease: Secondary | ICD-10-CM | POA: Diagnosis not present

## 2018-11-22 DIAGNOSIS — N186 End stage renal disease: Secondary | ICD-10-CM | POA: Diagnosis not present

## 2018-11-22 DIAGNOSIS — N2581 Secondary hyperparathyroidism of renal origin: Secondary | ICD-10-CM | POA: Diagnosis not present

## 2018-11-25 DIAGNOSIS — N186 End stage renal disease: Secondary | ICD-10-CM | POA: Diagnosis not present

## 2018-11-25 DIAGNOSIS — N2581 Secondary hyperparathyroidism of renal origin: Secondary | ICD-10-CM | POA: Diagnosis not present

## 2018-11-27 DIAGNOSIS — N2581 Secondary hyperparathyroidism of renal origin: Secondary | ICD-10-CM | POA: Diagnosis not present

## 2018-11-27 DIAGNOSIS — N186 End stage renal disease: Secondary | ICD-10-CM | POA: Diagnosis not present

## 2018-11-29 DIAGNOSIS — N2581 Secondary hyperparathyroidism of renal origin: Secondary | ICD-10-CM | POA: Diagnosis not present

## 2018-11-29 DIAGNOSIS — N186 End stage renal disease: Secondary | ICD-10-CM | POA: Diagnosis not present

## 2018-12-02 DIAGNOSIS — N2581 Secondary hyperparathyroidism of renal origin: Secondary | ICD-10-CM | POA: Diagnosis not present

## 2018-12-02 DIAGNOSIS — N186 End stage renal disease: Secondary | ICD-10-CM | POA: Diagnosis not present

## 2018-12-04 DIAGNOSIS — N2581 Secondary hyperparathyroidism of renal origin: Secondary | ICD-10-CM | POA: Diagnosis not present

## 2018-12-04 DIAGNOSIS — N186 End stage renal disease: Secondary | ICD-10-CM | POA: Diagnosis not present

## 2018-12-06 DIAGNOSIS — N186 End stage renal disease: Secondary | ICD-10-CM | POA: Diagnosis not present

## 2018-12-06 DIAGNOSIS — N2581 Secondary hyperparathyroidism of renal origin: Secondary | ICD-10-CM | POA: Diagnosis not present

## 2018-12-09 DIAGNOSIS — N186 End stage renal disease: Secondary | ICD-10-CM | POA: Diagnosis not present

## 2018-12-09 DIAGNOSIS — N2581 Secondary hyperparathyroidism of renal origin: Secondary | ICD-10-CM | POA: Diagnosis not present

## 2018-12-10 DIAGNOSIS — N529 Male erectile dysfunction, unspecified: Secondary | ICD-10-CM | POA: Diagnosis not present

## 2018-12-10 DIAGNOSIS — K581 Irritable bowel syndrome with constipation: Secondary | ICD-10-CM | POA: Diagnosis not present

## 2018-12-10 DIAGNOSIS — N400 Enlarged prostate without lower urinary tract symptoms: Secondary | ICD-10-CM | POA: Diagnosis not present

## 2018-12-10 DIAGNOSIS — I739 Peripheral vascular disease, unspecified: Secondary | ICD-10-CM | POA: Diagnosis not present

## 2018-12-10 DIAGNOSIS — I10 Essential (primary) hypertension: Secondary | ICD-10-CM | POA: Diagnosis not present

## 2018-12-10 DIAGNOSIS — D638 Anemia in other chronic diseases classified elsewhere: Secondary | ICD-10-CM | POA: Diagnosis not present

## 2018-12-10 DIAGNOSIS — N184 Chronic kidney disease, stage 4 (severe): Secondary | ICD-10-CM | POA: Diagnosis not present

## 2018-12-10 DIAGNOSIS — Z0001 Encounter for general adult medical examination with abnormal findings: Secondary | ICD-10-CM | POA: Diagnosis not present

## 2018-12-11 DIAGNOSIS — N2581 Secondary hyperparathyroidism of renal origin: Secondary | ICD-10-CM | POA: Diagnosis not present

## 2018-12-11 DIAGNOSIS — N186 End stage renal disease: Secondary | ICD-10-CM | POA: Diagnosis not present

## 2018-12-13 DIAGNOSIS — N186 End stage renal disease: Secondary | ICD-10-CM | POA: Diagnosis not present

## 2018-12-13 DIAGNOSIS — N2581 Secondary hyperparathyroidism of renal origin: Secondary | ICD-10-CM | POA: Diagnosis not present

## 2018-12-16 DIAGNOSIS — N2581 Secondary hyperparathyroidism of renal origin: Secondary | ICD-10-CM | POA: Diagnosis not present

## 2018-12-16 DIAGNOSIS — N186 End stage renal disease: Secondary | ICD-10-CM | POA: Diagnosis not present

## 2018-12-18 DIAGNOSIS — N2581 Secondary hyperparathyroidism of renal origin: Secondary | ICD-10-CM | POA: Diagnosis not present

## 2018-12-18 DIAGNOSIS — N186 End stage renal disease: Secondary | ICD-10-CM | POA: Diagnosis not present

## 2018-12-20 DIAGNOSIS — N186 End stage renal disease: Secondary | ICD-10-CM | POA: Diagnosis not present

## 2018-12-20 DIAGNOSIS — N2581 Secondary hyperparathyroidism of renal origin: Secondary | ICD-10-CM | POA: Diagnosis not present

## 2018-12-22 DIAGNOSIS — N186 End stage renal disease: Secondary | ICD-10-CM | POA: Diagnosis not present

## 2018-12-22 DIAGNOSIS — Z992 Dependence on renal dialysis: Secondary | ICD-10-CM | POA: Diagnosis not present

## 2018-12-22 DIAGNOSIS — I120 Hypertensive chronic kidney disease with stage 5 chronic kidney disease or end stage renal disease: Secondary | ICD-10-CM | POA: Diagnosis not present

## 2018-12-23 DIAGNOSIS — N2581 Secondary hyperparathyroidism of renal origin: Secondary | ICD-10-CM | POA: Diagnosis not present

## 2018-12-23 DIAGNOSIS — N186 End stage renal disease: Secondary | ICD-10-CM | POA: Diagnosis not present

## 2018-12-25 DIAGNOSIS — N2581 Secondary hyperparathyroidism of renal origin: Secondary | ICD-10-CM | POA: Diagnosis not present

## 2018-12-25 DIAGNOSIS — N186 End stage renal disease: Secondary | ICD-10-CM | POA: Diagnosis not present

## 2018-12-27 DIAGNOSIS — N186 End stage renal disease: Secondary | ICD-10-CM | POA: Diagnosis not present

## 2018-12-27 DIAGNOSIS — N2581 Secondary hyperparathyroidism of renal origin: Secondary | ICD-10-CM | POA: Diagnosis not present

## 2018-12-30 DIAGNOSIS — N2581 Secondary hyperparathyroidism of renal origin: Secondary | ICD-10-CM | POA: Diagnosis not present

## 2018-12-30 DIAGNOSIS — N186 End stage renal disease: Secondary | ICD-10-CM | POA: Diagnosis not present

## 2019-01-01 DIAGNOSIS — N186 End stage renal disease: Secondary | ICD-10-CM | POA: Diagnosis not present

## 2019-01-01 DIAGNOSIS — N2581 Secondary hyperparathyroidism of renal origin: Secondary | ICD-10-CM | POA: Diagnosis not present

## 2019-01-03 DIAGNOSIS — N186 End stage renal disease: Secondary | ICD-10-CM | POA: Diagnosis not present

## 2019-01-03 DIAGNOSIS — N2581 Secondary hyperparathyroidism of renal origin: Secondary | ICD-10-CM | POA: Diagnosis not present

## 2019-01-06 DIAGNOSIS — N186 End stage renal disease: Secondary | ICD-10-CM | POA: Diagnosis not present

## 2019-01-06 DIAGNOSIS — N2581 Secondary hyperparathyroidism of renal origin: Secondary | ICD-10-CM | POA: Diagnosis not present

## 2019-01-08 DIAGNOSIS — N186 End stage renal disease: Secondary | ICD-10-CM | POA: Diagnosis not present

## 2019-01-08 DIAGNOSIS — N2581 Secondary hyperparathyroidism of renal origin: Secondary | ICD-10-CM | POA: Diagnosis not present

## 2019-01-10 DIAGNOSIS — N2581 Secondary hyperparathyroidism of renal origin: Secondary | ICD-10-CM | POA: Diagnosis not present

## 2019-01-10 DIAGNOSIS — N186 End stage renal disease: Secondary | ICD-10-CM | POA: Diagnosis not present

## 2019-01-13 DIAGNOSIS — N186 End stage renal disease: Secondary | ICD-10-CM | POA: Diagnosis not present

## 2019-01-13 DIAGNOSIS — N2581 Secondary hyperparathyroidism of renal origin: Secondary | ICD-10-CM | POA: Diagnosis not present

## 2019-01-15 DIAGNOSIS — N186 End stage renal disease: Secondary | ICD-10-CM | POA: Diagnosis not present

## 2019-01-15 DIAGNOSIS — N2581 Secondary hyperparathyroidism of renal origin: Secondary | ICD-10-CM | POA: Diagnosis not present

## 2019-01-17 DIAGNOSIS — N2581 Secondary hyperparathyroidism of renal origin: Secondary | ICD-10-CM | POA: Diagnosis not present

## 2019-01-17 DIAGNOSIS — N186 End stage renal disease: Secondary | ICD-10-CM | POA: Diagnosis not present

## 2019-01-20 DIAGNOSIS — N2581 Secondary hyperparathyroidism of renal origin: Secondary | ICD-10-CM | POA: Diagnosis not present

## 2019-01-20 DIAGNOSIS — N186 End stage renal disease: Secondary | ICD-10-CM | POA: Diagnosis not present

## 2019-01-22 DIAGNOSIS — N2581 Secondary hyperparathyroidism of renal origin: Secondary | ICD-10-CM | POA: Diagnosis not present

## 2019-01-22 DIAGNOSIS — N186 End stage renal disease: Secondary | ICD-10-CM | POA: Diagnosis not present

## 2019-01-24 DIAGNOSIS — N2581 Secondary hyperparathyroidism of renal origin: Secondary | ICD-10-CM | POA: Diagnosis not present

## 2019-01-24 DIAGNOSIS — N186 End stage renal disease: Secondary | ICD-10-CM | POA: Diagnosis not present

## 2019-01-27 DIAGNOSIS — N186 End stage renal disease: Secondary | ICD-10-CM | POA: Diagnosis not present

## 2019-01-27 DIAGNOSIS — N2581 Secondary hyperparathyroidism of renal origin: Secondary | ICD-10-CM | POA: Diagnosis not present

## 2019-01-28 DIAGNOSIS — Z9079 Acquired absence of other genital organ(s): Secondary | ICD-10-CM | POA: Diagnosis not present

## 2019-01-28 DIAGNOSIS — Z1159 Encounter for screening for other viral diseases: Secondary | ICD-10-CM | POA: Diagnosis not present

## 2019-01-28 DIAGNOSIS — K5900 Constipation, unspecified: Secondary | ICD-10-CM | POA: Diagnosis not present

## 2019-01-28 DIAGNOSIS — Z4822 Encounter for aftercare following kidney transplant: Secondary | ICD-10-CM | POA: Diagnosis not present

## 2019-01-28 DIAGNOSIS — N529 Male erectile dysfunction, unspecified: Secondary | ICD-10-CM | POA: Diagnosis present

## 2019-01-28 DIAGNOSIS — E872 Acidosis: Secondary | ICD-10-CM | POA: Diagnosis not present

## 2019-01-28 DIAGNOSIS — I120 Hypertensive chronic kidney disease with stage 5 chronic kidney disease or end stage renal disease: Secondary | ICD-10-CM | POA: Diagnosis present

## 2019-01-28 DIAGNOSIS — E8770 Fluid overload, unspecified: Secondary | ICD-10-CM | POA: Diagnosis not present

## 2019-01-28 DIAGNOSIS — I10 Essential (primary) hypertension: Secondary | ICD-10-CM | POA: Diagnosis not present

## 2019-01-28 DIAGNOSIS — Z79899 Other long term (current) drug therapy: Secondary | ICD-10-CM | POA: Diagnosis not present

## 2019-01-28 DIAGNOSIS — Z5181 Encounter for therapeutic drug level monitoring: Secondary | ICD-10-CM | POA: Diagnosis not present

## 2019-01-28 DIAGNOSIS — Z8546 Personal history of malignant neoplasm of prostate: Secondary | ICD-10-CM | POA: Diagnosis not present

## 2019-01-28 DIAGNOSIS — D649 Anemia, unspecified: Secondary | ICD-10-CM | POA: Diagnosis not present

## 2019-01-28 DIAGNOSIS — Z940 Kidney transplant status: Secondary | ICD-10-CM | POA: Diagnosis not present

## 2019-01-28 DIAGNOSIS — D176 Benign lipomatous neoplasm of spermatic cord: Secondary | ICD-10-CM | POA: Diagnosis not present

## 2019-01-28 DIAGNOSIS — I440 Atrioventricular block, first degree: Secondary | ICD-10-CM | POA: Diagnosis not present

## 2019-01-28 DIAGNOSIS — Z7682 Awaiting organ transplant status: Secondary | ICD-10-CM | POA: Diagnosis not present

## 2019-01-28 DIAGNOSIS — Z87891 Personal history of nicotine dependence: Secondary | ICD-10-CM | POA: Diagnosis not present

## 2019-01-28 DIAGNOSIS — Z452 Encounter for adjustment and management of vascular access device: Secondary | ICD-10-CM | POA: Diagnosis not present

## 2019-01-28 DIAGNOSIS — E876 Hypokalemia: Secondary | ICD-10-CM | POA: Diagnosis not present

## 2019-01-28 DIAGNOSIS — K439 Ventral hernia without obstruction or gangrene: Secondary | ICD-10-CM | POA: Diagnosis present

## 2019-01-28 DIAGNOSIS — D62 Acute posthemorrhagic anemia: Secondary | ICD-10-CM | POA: Diagnosis not present

## 2019-01-28 DIAGNOSIS — Z992 Dependence on renal dialysis: Secondary | ICD-10-CM | POA: Diagnosis not present

## 2019-01-28 DIAGNOSIS — D631 Anemia in chronic kidney disease: Secondary | ICD-10-CM | POA: Diagnosis present

## 2019-01-28 DIAGNOSIS — N186 End stage renal disease: Secondary | ICD-10-CM | POA: Diagnosis present

## 2019-01-28 DIAGNOSIS — N269 Renal sclerosis, unspecified: Secondary | ICD-10-CM | POA: Diagnosis not present

## 2019-01-28 DIAGNOSIS — N261 Atrophy of kidney (terminal): Secondary | ICD-10-CM | POA: Diagnosis not present

## 2019-01-28 DIAGNOSIS — E873 Alkalosis: Secondary | ICD-10-CM | POA: Diagnosis not present

## 2019-01-28 DIAGNOSIS — K5909 Other constipation: Secondary | ICD-10-CM | POA: Diagnosis present

## 2019-01-28 DIAGNOSIS — Z01818 Encounter for other preprocedural examination: Secondary | ICD-10-CM | POA: Diagnosis not present

## 2019-02-05 DIAGNOSIS — I120 Hypertensive chronic kidney disease with stage 5 chronic kidney disease or end stage renal disease: Secondary | ICD-10-CM | POA: Diagnosis not present

## 2019-02-05 DIAGNOSIS — N186 End stage renal disease: Secondary | ICD-10-CM | POA: Diagnosis not present

## 2019-02-05 DIAGNOSIS — I151 Hypertension secondary to other renal disorders: Secondary | ICD-10-CM | POA: Diagnosis not present

## 2019-02-05 DIAGNOSIS — Z5181 Encounter for therapeutic drug level monitoring: Secondary | ICD-10-CM | POA: Diagnosis not present

## 2019-02-05 DIAGNOSIS — E876 Hypokalemia: Secondary | ICD-10-CM | POA: Diagnosis not present

## 2019-02-05 DIAGNOSIS — Z79899 Other long term (current) drug therapy: Secondary | ICD-10-CM | POA: Diagnosis not present

## 2019-02-05 DIAGNOSIS — E873 Alkalosis: Secondary | ICD-10-CM | POA: Diagnosis not present

## 2019-02-05 DIAGNOSIS — Z4822 Encounter for aftercare following kidney transplant: Secondary | ICD-10-CM | POA: Diagnosis not present

## 2019-02-05 DIAGNOSIS — Z792 Long term (current) use of antibiotics: Secondary | ICD-10-CM | POA: Diagnosis not present

## 2019-02-05 DIAGNOSIS — E872 Acidosis: Secondary | ICD-10-CM | POA: Diagnosis not present

## 2019-02-05 DIAGNOSIS — Z7952 Long term (current) use of systemic steroids: Secondary | ICD-10-CM | POA: Diagnosis not present

## 2019-02-05 DIAGNOSIS — E871 Hypo-osmolality and hyponatremia: Secondary | ICD-10-CM | POA: Diagnosis not present

## 2019-02-05 DIAGNOSIS — E869 Volume depletion, unspecified: Secondary | ICD-10-CM | POA: Diagnosis not present

## 2019-02-05 DIAGNOSIS — D649 Anemia, unspecified: Secondary | ICD-10-CM | POA: Diagnosis not present

## 2019-02-09 DIAGNOSIS — Z8679 Personal history of other diseases of the circulatory system: Secondary | ICD-10-CM | POA: Diagnosis not present

## 2019-02-09 DIAGNOSIS — Z792 Long term (current) use of antibiotics: Secondary | ICD-10-CM | POA: Diagnosis not present

## 2019-02-09 DIAGNOSIS — Z940 Kidney transplant status: Secondary | ICD-10-CM | POA: Diagnosis not present

## 2019-02-09 DIAGNOSIS — I10 Essential (primary) hypertension: Secondary | ICD-10-CM | POA: Diagnosis not present

## 2019-02-09 DIAGNOSIS — D631 Anemia in chronic kidney disease: Secondary | ICD-10-CM | POA: Diagnosis not present

## 2019-02-09 DIAGNOSIS — Z4822 Encounter for aftercare following kidney transplant: Secondary | ICD-10-CM | POA: Diagnosis not present

## 2019-02-09 DIAGNOSIS — C61 Malignant neoplasm of prostate: Secondary | ICD-10-CM | POA: Diagnosis not present

## 2019-02-09 DIAGNOSIS — Z5181 Encounter for therapeutic drug level monitoring: Secondary | ICD-10-CM | POA: Diagnosis not present

## 2019-02-09 DIAGNOSIS — Z7952 Long term (current) use of systemic steroids: Secondary | ICD-10-CM | POA: Diagnosis not present

## 2019-02-09 DIAGNOSIS — D899 Disorder involving the immune mechanism, unspecified: Secondary | ICD-10-CM | POA: Diagnosis not present

## 2019-02-09 DIAGNOSIS — N184 Chronic kidney disease, stage 4 (severe): Secondary | ICD-10-CM | POA: Diagnosis not present

## 2019-02-09 DIAGNOSIS — D649 Anemia, unspecified: Secondary | ICD-10-CM | POA: Diagnosis not present

## 2019-02-09 DIAGNOSIS — Z79899 Other long term (current) drug therapy: Secondary | ICD-10-CM | POA: Diagnosis not present

## 2019-02-12 DIAGNOSIS — D649 Anemia, unspecified: Secondary | ICD-10-CM | POA: Diagnosis not present

## 2019-02-12 DIAGNOSIS — Z5181 Encounter for therapeutic drug level monitoring: Secondary | ICD-10-CM | POA: Diagnosis not present

## 2019-02-12 DIAGNOSIS — E876 Hypokalemia: Secondary | ICD-10-CM | POA: Diagnosis not present

## 2019-02-12 DIAGNOSIS — I10 Essential (primary) hypertension: Secondary | ICD-10-CM | POA: Diagnosis not present

## 2019-02-12 DIAGNOSIS — Z4822 Encounter for aftercare following kidney transplant: Secondary | ICD-10-CM | POA: Diagnosis not present

## 2019-02-12 DIAGNOSIS — D8989 Other specified disorders involving the immune mechanism, not elsewhere classified: Secondary | ICD-10-CM | POA: Diagnosis not present

## 2019-02-12 DIAGNOSIS — Z79899 Other long term (current) drug therapy: Secondary | ICD-10-CM | POA: Diagnosis not present

## 2019-02-12 DIAGNOSIS — Z8679 Personal history of other diseases of the circulatory system: Secondary | ICD-10-CM | POA: Diagnosis not present

## 2019-02-12 DIAGNOSIS — Z940 Kidney transplant status: Secondary | ICD-10-CM | POA: Diagnosis not present

## 2019-02-16 DIAGNOSIS — I10 Essential (primary) hypertension: Secondary | ICD-10-CM | POA: Diagnosis not present

## 2019-02-16 DIAGNOSIS — Z4822 Encounter for aftercare following kidney transplant: Secondary | ICD-10-CM | POA: Diagnosis not present

## 2019-02-16 DIAGNOSIS — D649 Anemia, unspecified: Secondary | ICD-10-CM | POA: Diagnosis not present

## 2019-02-16 DIAGNOSIS — Z79899 Other long term (current) drug therapy: Secondary | ICD-10-CM | POA: Diagnosis not present

## 2019-02-16 DIAGNOSIS — Z940 Kidney transplant status: Secondary | ICD-10-CM | POA: Diagnosis not present

## 2019-02-16 DIAGNOSIS — D899 Disorder involving the immune mechanism, unspecified: Secondary | ICD-10-CM | POA: Diagnosis not present

## 2019-02-16 DIAGNOSIS — Z5181 Encounter for therapeutic drug level monitoring: Secondary | ICD-10-CM | POA: Diagnosis not present

## 2019-02-16 DIAGNOSIS — E876 Hypokalemia: Secondary | ICD-10-CM | POA: Diagnosis not present

## 2019-02-19 DIAGNOSIS — Z79899 Other long term (current) drug therapy: Secondary | ICD-10-CM | POA: Diagnosis not present

## 2019-02-19 DIAGNOSIS — C61 Malignant neoplasm of prostate: Secondary | ICD-10-CM | POA: Diagnosis not present

## 2019-02-19 DIAGNOSIS — E876 Hypokalemia: Secondary | ICD-10-CM | POA: Diagnosis not present

## 2019-02-19 DIAGNOSIS — D649 Anemia, unspecified: Secondary | ICD-10-CM | POA: Diagnosis not present

## 2019-02-19 DIAGNOSIS — Z940 Kidney transplant status: Secondary | ICD-10-CM | POA: Diagnosis not present

## 2019-02-19 DIAGNOSIS — D899 Disorder involving the immune mechanism, unspecified: Secondary | ICD-10-CM | POA: Diagnosis not present

## 2019-02-19 DIAGNOSIS — K5909 Other constipation: Secondary | ICD-10-CM | POA: Diagnosis not present

## 2019-02-19 DIAGNOSIS — Z9079 Acquired absence of other genital organ(s): Secondary | ICD-10-CM | POA: Diagnosis not present

## 2019-02-19 DIAGNOSIS — I10 Essential (primary) hypertension: Secondary | ICD-10-CM | POA: Diagnosis not present

## 2019-02-19 DIAGNOSIS — E8779 Other fluid overload: Secondary | ICD-10-CM | POA: Diagnosis not present

## 2019-02-19 DIAGNOSIS — Z4822 Encounter for aftercare following kidney transplant: Secondary | ICD-10-CM | POA: Diagnosis not present

## 2019-02-19 DIAGNOSIS — D72819 Decreased white blood cell count, unspecified: Secondary | ICD-10-CM | POA: Diagnosis not present

## 2019-02-23 DIAGNOSIS — Z79899 Other long term (current) drug therapy: Secondary | ICD-10-CM | POA: Diagnosis not present

## 2019-02-23 DIAGNOSIS — K5909 Other constipation: Secondary | ICD-10-CM | POA: Diagnosis not present

## 2019-02-23 DIAGNOSIS — Z7952 Long term (current) use of systemic steroids: Secondary | ICD-10-CM | POA: Diagnosis not present

## 2019-02-23 DIAGNOSIS — Z792 Long term (current) use of antibiotics: Secondary | ICD-10-CM | POA: Diagnosis not present

## 2019-02-23 DIAGNOSIS — I10 Essential (primary) hypertension: Secondary | ICD-10-CM | POA: Diagnosis not present

## 2019-02-23 DIAGNOSIS — Z5181 Encounter for therapeutic drug level monitoring: Secondary | ICD-10-CM | POA: Diagnosis not present

## 2019-02-23 DIAGNOSIS — D72819 Decreased white blood cell count, unspecified: Secondary | ICD-10-CM | POA: Diagnosis not present

## 2019-02-23 DIAGNOSIS — E876 Hypokalemia: Secondary | ICD-10-CM | POA: Diagnosis not present

## 2019-02-23 DIAGNOSIS — D649 Anemia, unspecified: Secondary | ICD-10-CM | POA: Diagnosis not present

## 2019-02-23 DIAGNOSIS — Z940 Kidney transplant status: Secondary | ICD-10-CM | POA: Diagnosis not present

## 2019-02-23 DIAGNOSIS — Z4822 Encounter for aftercare following kidney transplant: Secondary | ICD-10-CM | POA: Diagnosis not present

## 2019-02-23 DIAGNOSIS — Z960 Presence of urogenital implants: Secondary | ICD-10-CM | POA: Diagnosis not present

## 2019-02-26 DIAGNOSIS — E876 Hypokalemia: Secondary | ICD-10-CM | POA: Diagnosis not present

## 2019-02-26 DIAGNOSIS — Z79899 Other long term (current) drug therapy: Secondary | ICD-10-CM | POA: Diagnosis not present

## 2019-02-26 DIAGNOSIS — I129 Hypertensive chronic kidney disease with stage 1 through stage 4 chronic kidney disease, or unspecified chronic kidney disease: Secondary | ICD-10-CM | POA: Diagnosis not present

## 2019-02-26 DIAGNOSIS — Z87891 Personal history of nicotine dependence: Secondary | ICD-10-CM | POA: Diagnosis not present

## 2019-02-26 DIAGNOSIS — D8989 Other specified disorders involving the immune mechanism, not elsewhere classified: Secondary | ICD-10-CM | POA: Diagnosis not present

## 2019-02-26 DIAGNOSIS — Z792 Long term (current) use of antibiotics: Secondary | ICD-10-CM | POA: Diagnosis not present

## 2019-02-26 DIAGNOSIS — K5909 Other constipation: Secondary | ICD-10-CM | POA: Diagnosis not present

## 2019-02-26 DIAGNOSIS — D649 Anemia, unspecified: Secondary | ICD-10-CM | POA: Diagnosis not present

## 2019-02-26 DIAGNOSIS — D631 Anemia in chronic kidney disease: Secondary | ICD-10-CM | POA: Diagnosis not present

## 2019-02-26 DIAGNOSIS — Z8546 Personal history of malignant neoplasm of prostate: Secondary | ICD-10-CM | POA: Diagnosis not present

## 2019-02-26 DIAGNOSIS — N184 Chronic kidney disease, stage 4 (severe): Secondary | ICD-10-CM | POA: Diagnosis not present

## 2019-02-26 DIAGNOSIS — Z4822 Encounter for aftercare following kidney transplant: Secondary | ICD-10-CM | POA: Diagnosis not present

## 2019-02-26 DIAGNOSIS — I10 Essential (primary) hypertension: Secondary | ICD-10-CM | POA: Diagnosis not present

## 2019-02-26 DIAGNOSIS — Z7952 Long term (current) use of systemic steroids: Secondary | ICD-10-CM | POA: Diagnosis not present

## 2019-02-26 DIAGNOSIS — D72819 Decreased white blood cell count, unspecified: Secondary | ICD-10-CM | POA: Diagnosis not present

## 2019-02-26 DIAGNOSIS — Z940 Kidney transplant status: Secondary | ICD-10-CM | POA: Diagnosis not present

## 2019-03-02 DIAGNOSIS — Z940 Kidney transplant status: Secondary | ICD-10-CM | POA: Diagnosis not present

## 2019-03-02 DIAGNOSIS — Z4822 Encounter for aftercare following kidney transplant: Secondary | ICD-10-CM | POA: Diagnosis not present

## 2019-03-03 DIAGNOSIS — Z5181 Encounter for therapeutic drug level monitoring: Secondary | ICD-10-CM | POA: Diagnosis not present

## 2019-03-03 DIAGNOSIS — Z79899 Other long term (current) drug therapy: Secondary | ICD-10-CM | POA: Diagnosis not present

## 2019-03-03 DIAGNOSIS — Z940 Kidney transplant status: Secondary | ICD-10-CM | POA: Diagnosis not present

## 2019-03-03 DIAGNOSIS — T8619 Other complication of kidney transplant: Secondary | ICD-10-CM | POA: Diagnosis not present

## 2019-03-03 DIAGNOSIS — Z4822 Encounter for aftercare following kidney transplant: Secondary | ICD-10-CM | POA: Diagnosis not present

## 2019-03-06 DIAGNOSIS — Z79899 Other long term (current) drug therapy: Secondary | ICD-10-CM | POA: Diagnosis not present

## 2019-03-06 DIAGNOSIS — Z9079 Acquired absence of other genital organ(s): Secondary | ICD-10-CM | POA: Diagnosis not present

## 2019-03-06 DIAGNOSIS — Z8546 Personal history of malignant neoplasm of prostate: Secondary | ICD-10-CM | POA: Diagnosis not present

## 2019-03-06 DIAGNOSIS — Z7952 Long term (current) use of systemic steroids: Secondary | ICD-10-CM | POA: Diagnosis not present

## 2019-03-06 DIAGNOSIS — Z5181 Encounter for therapeutic drug level monitoring: Secondary | ICD-10-CM | POA: Diagnosis not present

## 2019-03-06 DIAGNOSIS — Z4822 Encounter for aftercare following kidney transplant: Secondary | ICD-10-CM | POA: Diagnosis not present

## 2019-03-06 DIAGNOSIS — I10 Essential (primary) hypertension: Secondary | ICD-10-CM | POA: Diagnosis not present

## 2019-03-06 DIAGNOSIS — Z940 Kidney transplant status: Secondary | ICD-10-CM | POA: Diagnosis not present

## 2019-03-06 DIAGNOSIS — D649 Anemia, unspecified: Secondary | ICD-10-CM | POA: Diagnosis not present

## 2019-03-06 DIAGNOSIS — E876 Hypokalemia: Secondary | ICD-10-CM | POA: Diagnosis not present

## 2019-03-06 DIAGNOSIS — K5909 Other constipation: Secondary | ICD-10-CM | POA: Diagnosis not present

## 2019-03-06 DIAGNOSIS — Z792 Long term (current) use of antibiotics: Secondary | ICD-10-CM | POA: Diagnosis not present

## 2019-03-06 DIAGNOSIS — D72819 Decreased white blood cell count, unspecified: Secondary | ICD-10-CM | POA: Diagnosis not present

## 2019-03-09 IMAGING — CT CT ABD-PELV W/O
2 of 4 series · 15 of 46 positions shown, 17 images · non-contrast
Comparison: None.

CLINICAL DATA: Bowel obstruction, high-grade. Left-sided abdominal
pain for 2 days. Bowel obstruction diagnosed at primary care
physician.

EXAM:
CT ABDOMEN AND PELVIS WITHOUT CONTRAST
TECHNIQUE: Multidetector CT imaging of the abdomen and pelvis was performed
following the standard protocol without IV

[Series 3: abd/ pelvis 5.0 i30f 2 · axial · 0.92mm/px · z∈[+935,+1380]mm · 12 of 99 slices shown, 14 images]
[im 5/99  soft-tissue]
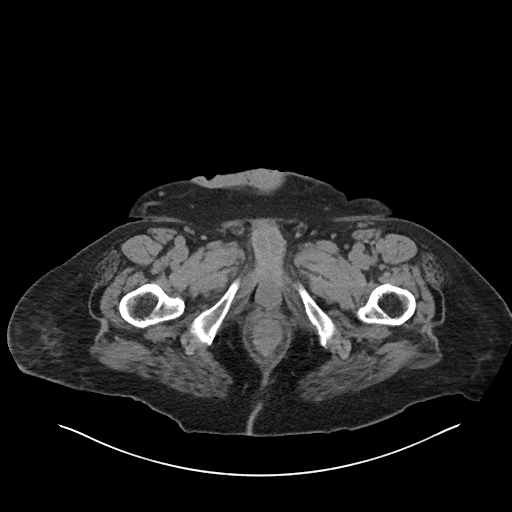
[im 5/99  bone]
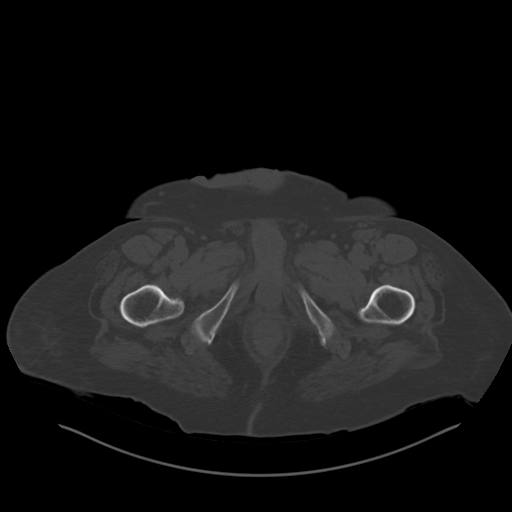
[im 13/99  soft-tissue]
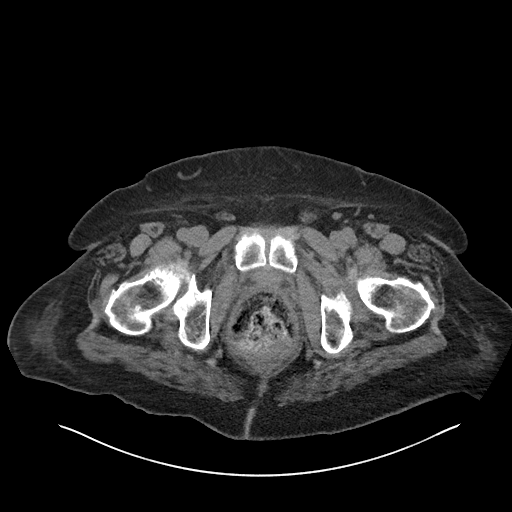
[im 21/99  soft-tissue]
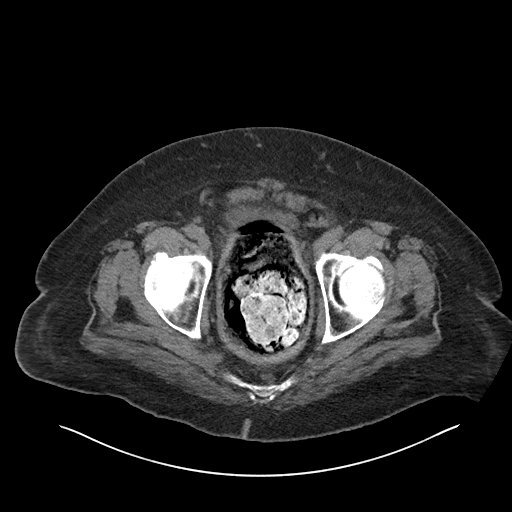
[im 29/99  soft-tissue]
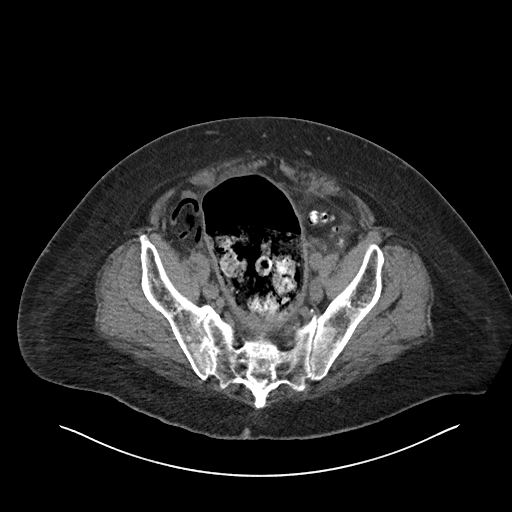
[im 37/99  soft-tissue]
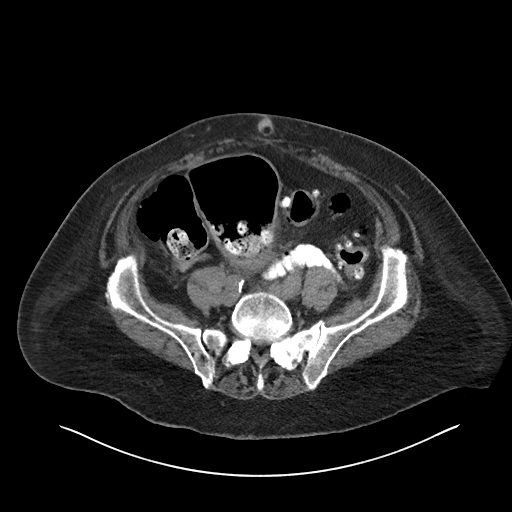
[im 45/99  soft-tissue]
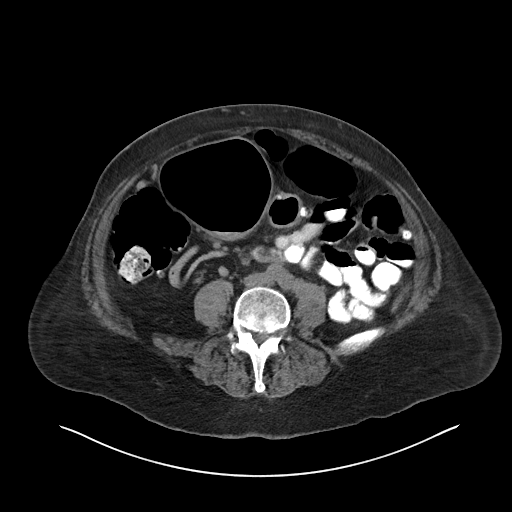
[im 54/99  soft-tissue]
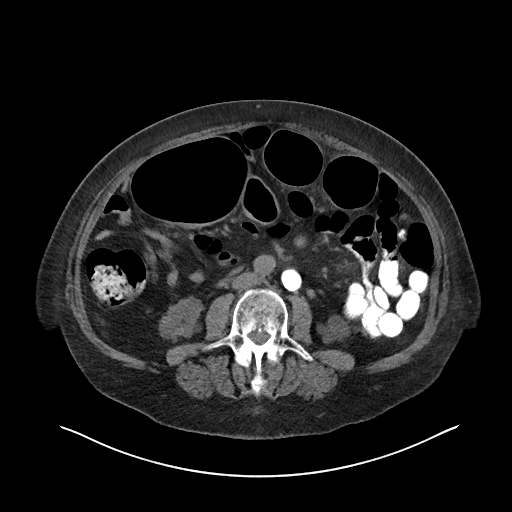
[im 62/99  soft-tissue]
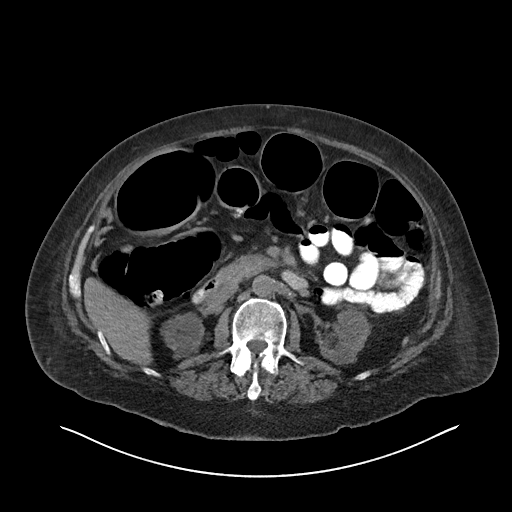
[im 70/99  soft-tissue]
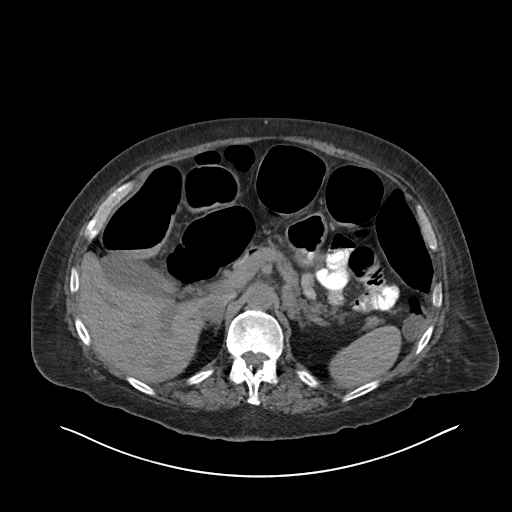
[im 70/99  bone]
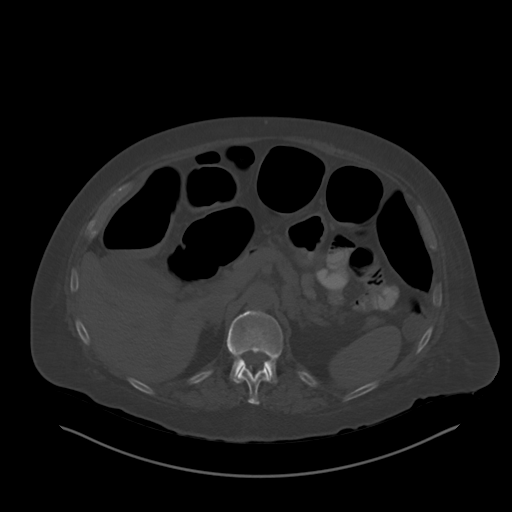
[im 78/99  soft-tissue]
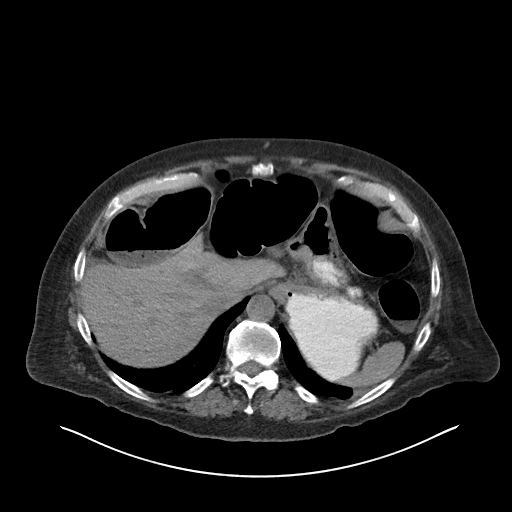
[im 86/99  soft-tissue]
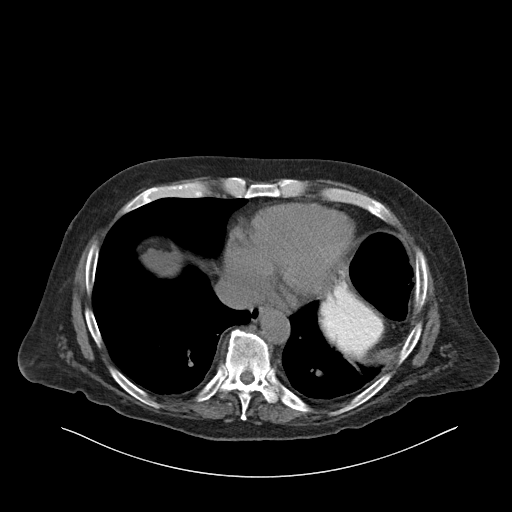
[im 94/99  soft-tissue]
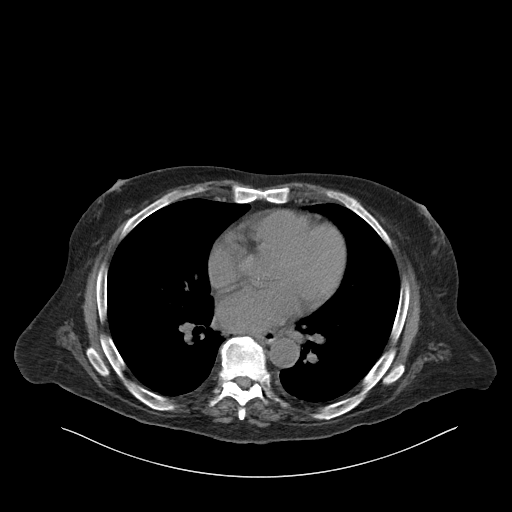

[Series 6: cor st · coronal · 0.80mm/px · 3 of 92 slices shown]
[im 31/92  soft-tissue]
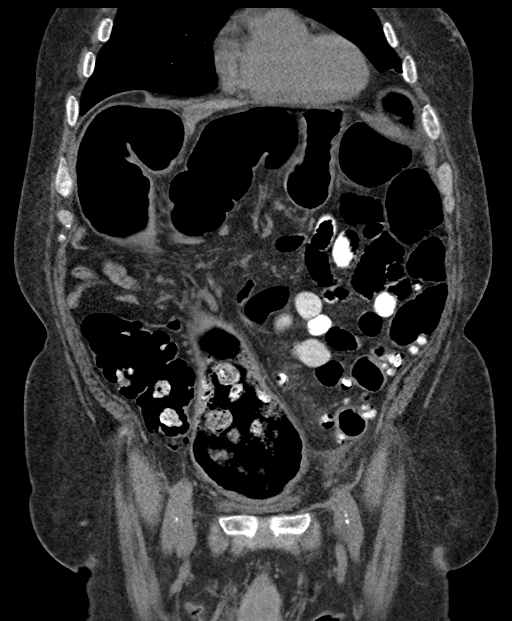
[im 41/92  soft-tissue]
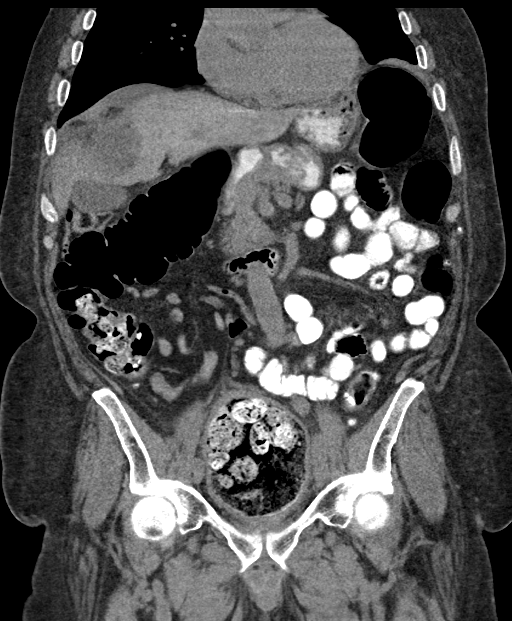
[im 51/92  soft-tissue]
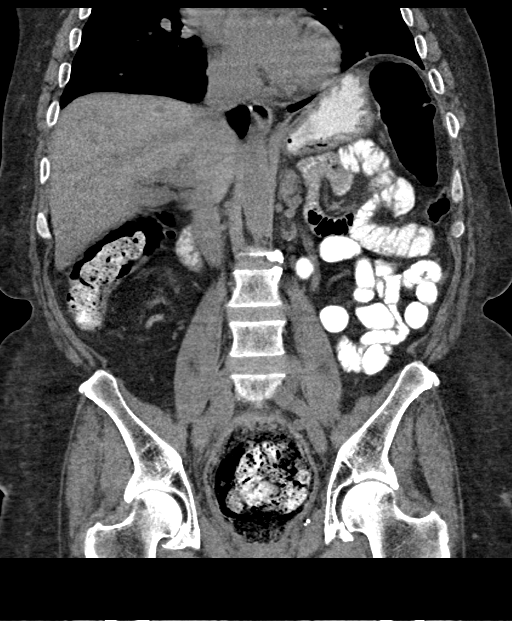

[15 of 46 positions shown; findings below may reference images not displayed]

FINDINGS: Lower chest: Bibasilar atelectasis, left greater than right. No
pleural fluid.

Hepatobiliary: There is a 4 cm cyst in the posterior right lobe of
the liver. Additional smaller hypodense lesion in the right hepatic
dome is too small to characterize. Gallbladder is physiologically
distended. No calcified gallstone. No biliary dilatation.

Pancreas: No ductal dilatation or inflammation. Parenchymal atrophy.

Spleen: Normal in size without focal abnormality.

Adrenals/Urinary Tract: No adrenal nodule. Atrophic kidneys with
cystic replacement. No hydronephrosis or perinephric edema. Urinary
bladder completely empty.

Stomach/Bowel: Stomach is physiologically distended. Administered
enteric contrast reaches the mid small bowel. There is no small
bowel dilatation. The appendix is normal. Diffuse colonic distension
with air and stool. There is significant colonic tortuosity,
particularly the sigmoid. No evidence of mesenteric twisting or
volvulus rectum is distended with stool ball and inspissated
spanning 8.8 cm with rectal wall thickening and mild perirectal
edema. No evidence of pneumatosis. Colonic diverticulosis of the
descending and sigmoid colon without diverticulitis.

Vascular/Lymphatic: Aortic and branch atherosclerosis without
aneurysm. No enlarged lymph nodes in the abdomen or pelvis.

Reproductive: Post prostatectomy.

Other: Small volume of free fluid in the right upper quadrant and
left pelvis. Mild edema of pelvic fat. No free air or
intra-abdominal abscess.

Musculoskeletal: Multilevel degenerative change in the spine. The
bones are under mineralized. There are no acute or suspicious
osseous abnormalities.
IMPRESSION: 1. Colonic distension and tortuosity with stool and air throughout
the colon. A stool ball is in the rectum with rectal distention and
associated wall thickening. Findings suggesting fecal impaction with
total colonic ileus or Foush syndrome. There is no small bowel
dilatation or small bowel obstruction.
2. Distal colonic diverticulosis without diverticulitis.
3. Chronic renal disease with renal atrophy and cystic replacement.
4.  Aortic Atherosclerosis ([id number]).

## 2019-03-11 DIAGNOSIS — N400 Enlarged prostate without lower urinary tract symptoms: Secondary | ICD-10-CM | POA: Diagnosis not present

## 2019-03-11 DIAGNOSIS — K581 Irritable bowel syndrome with constipation: Secondary | ICD-10-CM | POA: Diagnosis not present

## 2019-03-11 DIAGNOSIS — D638 Anemia in other chronic diseases classified elsewhere: Secondary | ICD-10-CM | POA: Diagnosis not present

## 2019-03-11 DIAGNOSIS — I739 Peripheral vascular disease, unspecified: Secondary | ICD-10-CM | POA: Diagnosis not present

## 2019-03-11 DIAGNOSIS — I10 Essential (primary) hypertension: Secondary | ICD-10-CM | POA: Diagnosis not present

## 2019-03-11 DIAGNOSIS — N529 Male erectile dysfunction, unspecified: Secondary | ICD-10-CM | POA: Diagnosis not present

## 2019-03-11 DIAGNOSIS — N184 Chronic kidney disease, stage 4 (severe): Secondary | ICD-10-CM | POA: Diagnosis not present

## 2019-03-12 DIAGNOSIS — E876 Hypokalemia: Secondary | ICD-10-CM | POA: Diagnosis not present

## 2019-03-12 DIAGNOSIS — D899 Disorder involving the immune mechanism, unspecified: Secondary | ICD-10-CM | POA: Diagnosis not present

## 2019-03-12 DIAGNOSIS — Z8546 Personal history of malignant neoplasm of prostate: Secondary | ICD-10-CM | POA: Diagnosis not present

## 2019-03-12 DIAGNOSIS — D649 Anemia, unspecified: Secondary | ICD-10-CM | POA: Diagnosis not present

## 2019-03-12 DIAGNOSIS — Z940 Kidney transplant status: Secondary | ICD-10-CM | POA: Diagnosis not present

## 2019-03-12 DIAGNOSIS — Z5181 Encounter for therapeutic drug level monitoring: Secondary | ICD-10-CM | POA: Diagnosis not present

## 2019-03-12 DIAGNOSIS — D72819 Decreased white blood cell count, unspecified: Secondary | ICD-10-CM | POA: Diagnosis not present

## 2019-03-12 DIAGNOSIS — Z4822 Encounter for aftercare following kidney transplant: Secondary | ICD-10-CM | POA: Diagnosis not present

## 2019-03-12 DIAGNOSIS — Z79899 Other long term (current) drug therapy: Secondary | ICD-10-CM | POA: Diagnosis not present

## 2019-03-12 DIAGNOSIS — Z9079 Acquired absence of other genital organ(s): Secondary | ICD-10-CM | POA: Diagnosis not present

## 2019-03-12 DIAGNOSIS — I10 Essential (primary) hypertension: Secondary | ICD-10-CM | POA: Diagnosis not present

## 2019-03-12 DIAGNOSIS — Z792 Long term (current) use of antibiotics: Secondary | ICD-10-CM | POA: Diagnosis not present

## 2019-03-12 DIAGNOSIS — Z7952 Long term (current) use of systemic steroids: Secondary | ICD-10-CM | POA: Diagnosis not present

## 2019-03-12 DIAGNOSIS — K5909 Other constipation: Secondary | ICD-10-CM | POA: Diagnosis not present

## 2019-03-19 DIAGNOSIS — I10 Essential (primary) hypertension: Secondary | ICD-10-CM | POA: Diagnosis not present

## 2019-03-19 DIAGNOSIS — K5909 Other constipation: Secondary | ICD-10-CM | POA: Diagnosis not present

## 2019-03-19 DIAGNOSIS — Z79899 Other long term (current) drug therapy: Secondary | ICD-10-CM | POA: Diagnosis not present

## 2019-03-19 DIAGNOSIS — E876 Hypokalemia: Secondary | ICD-10-CM | POA: Diagnosis not present

## 2019-03-19 DIAGNOSIS — Z940 Kidney transplant status: Secondary | ICD-10-CM | POA: Diagnosis not present

## 2019-03-19 DIAGNOSIS — D631 Anemia in chronic kidney disease: Secondary | ICD-10-CM | POA: Diagnosis not present

## 2019-03-19 DIAGNOSIS — Z9079 Acquired absence of other genital organ(s): Secondary | ICD-10-CM | POA: Diagnosis not present

## 2019-03-19 DIAGNOSIS — Z5181 Encounter for therapeutic drug level monitoring: Secondary | ICD-10-CM | POA: Diagnosis not present

## 2019-03-19 DIAGNOSIS — D8989 Other specified disorders involving the immune mechanism, not elsewhere classified: Secondary | ICD-10-CM | POA: Diagnosis not present

## 2019-03-19 DIAGNOSIS — Z4822 Encounter for aftercare following kidney transplant: Secondary | ICD-10-CM | POA: Diagnosis not present

## 2019-03-19 DIAGNOSIS — D72819 Decreased white blood cell count, unspecified: Secondary | ICD-10-CM | POA: Diagnosis not present

## 2019-03-19 DIAGNOSIS — Z7952 Long term (current) use of systemic steroids: Secondary | ICD-10-CM | POA: Diagnosis not present

## 2019-03-19 DIAGNOSIS — D649 Anemia, unspecified: Secondary | ICD-10-CM | POA: Diagnosis not present

## 2019-03-19 DIAGNOSIS — I129 Hypertensive chronic kidney disease with stage 1 through stage 4 chronic kidney disease, or unspecified chronic kidney disease: Secondary | ICD-10-CM | POA: Diagnosis not present

## 2019-03-19 DIAGNOSIS — Z792 Long term (current) use of antibiotics: Secondary | ICD-10-CM | POA: Diagnosis not present

## 2019-03-19 DIAGNOSIS — Z8546 Personal history of malignant neoplasm of prostate: Secondary | ICD-10-CM | POA: Diagnosis not present

## 2019-03-19 DIAGNOSIS — N184 Chronic kidney disease, stage 4 (severe): Secondary | ICD-10-CM | POA: Diagnosis not present

## 2019-03-26 DIAGNOSIS — E876 Hypokalemia: Secondary | ICD-10-CM | POA: Diagnosis not present

## 2019-03-26 DIAGNOSIS — Z7952 Long term (current) use of systemic steroids: Secondary | ICD-10-CM | POA: Diagnosis not present

## 2019-03-26 DIAGNOSIS — Z792 Long term (current) use of antibiotics: Secondary | ICD-10-CM | POA: Diagnosis not present

## 2019-03-26 DIAGNOSIS — Z940 Kidney transplant status: Secondary | ICD-10-CM | POA: Diagnosis not present

## 2019-03-26 DIAGNOSIS — D649 Anemia, unspecified: Secondary | ICD-10-CM | POA: Diagnosis not present

## 2019-03-26 DIAGNOSIS — D72819 Decreased white blood cell count, unspecified: Secondary | ICD-10-CM | POA: Diagnosis not present

## 2019-03-26 DIAGNOSIS — I10 Essential (primary) hypertension: Secondary | ICD-10-CM | POA: Diagnosis not present

## 2019-03-26 DIAGNOSIS — Z87891 Personal history of nicotine dependence: Secondary | ICD-10-CM | POA: Diagnosis not present

## 2019-03-26 DIAGNOSIS — D899 Disorder involving the immune mechanism, unspecified: Secondary | ICD-10-CM | POA: Diagnosis not present

## 2019-03-26 DIAGNOSIS — K5909 Other constipation: Secondary | ICD-10-CM | POA: Diagnosis not present

## 2019-03-26 DIAGNOSIS — Z8546 Personal history of malignant neoplasm of prostate: Secondary | ICD-10-CM | POA: Diagnosis not present

## 2019-03-26 DIAGNOSIS — Z79899 Other long term (current) drug therapy: Secondary | ICD-10-CM | POA: Diagnosis not present

## 2019-03-26 DIAGNOSIS — Z4822 Encounter for aftercare following kidney transplant: Secondary | ICD-10-CM | POA: Diagnosis not present

## 2019-04-02 DIAGNOSIS — E875 Hyperkalemia: Secondary | ICD-10-CM | POA: Diagnosis not present

## 2019-04-02 DIAGNOSIS — R32 Unspecified urinary incontinence: Secondary | ICD-10-CM | POA: Diagnosis not present

## 2019-04-02 DIAGNOSIS — I10 Essential (primary) hypertension: Secondary | ICD-10-CM | POA: Diagnosis not present

## 2019-04-02 DIAGNOSIS — D649 Anemia, unspecified: Secondary | ICD-10-CM | POA: Diagnosis not present

## 2019-04-02 DIAGNOSIS — Z940 Kidney transplant status: Secondary | ICD-10-CM | POA: Diagnosis not present

## 2019-04-02 DIAGNOSIS — K5900 Constipation, unspecified: Secondary | ICD-10-CM | POA: Diagnosis not present

## 2019-04-02 DIAGNOSIS — Z79899 Other long term (current) drug therapy: Secondary | ICD-10-CM | POA: Diagnosis not present

## 2019-04-02 DIAGNOSIS — Z87891 Personal history of nicotine dependence: Secondary | ICD-10-CM | POA: Diagnosis not present

## 2019-04-02 DIAGNOSIS — R609 Edema, unspecified: Secondary | ICD-10-CM | POA: Diagnosis not present

## 2019-04-02 DIAGNOSIS — D72819 Decreased white blood cell count, unspecified: Secondary | ICD-10-CM | POA: Diagnosis not present

## 2019-04-02 DIAGNOSIS — Z792 Long term (current) use of antibiotics: Secondary | ICD-10-CM | POA: Diagnosis not present

## 2019-04-02 DIAGNOSIS — K5909 Other constipation: Secondary | ICD-10-CM | POA: Diagnosis not present

## 2019-04-02 DIAGNOSIS — Z4822 Encounter for aftercare following kidney transplant: Secondary | ICD-10-CM | POA: Diagnosis not present

## 2019-04-02 DIAGNOSIS — Z8546 Personal history of malignant neoplasm of prostate: Secondary | ICD-10-CM | POA: Diagnosis not present

## 2019-04-02 DIAGNOSIS — Z7952 Long term (current) use of systemic steroids: Secondary | ICD-10-CM | POA: Diagnosis not present

## 2019-04-02 DIAGNOSIS — D8989 Other specified disorders involving the immune mechanism, not elsewhere classified: Secondary | ICD-10-CM | POA: Diagnosis not present

## 2019-04-02 DIAGNOSIS — E876 Hypokalemia: Secondary | ICD-10-CM | POA: Diagnosis not present

## 2019-04-06 DIAGNOSIS — Z4822 Encounter for aftercare following kidney transplant: Secondary | ICD-10-CM | POA: Diagnosis not present

## 2019-04-09 DIAGNOSIS — Z79899 Other long term (current) drug therapy: Secondary | ICD-10-CM | POA: Diagnosis not present

## 2019-04-09 DIAGNOSIS — I10 Essential (primary) hypertension: Secondary | ICD-10-CM | POA: Diagnosis not present

## 2019-04-09 DIAGNOSIS — R609 Edema, unspecified: Secondary | ICD-10-CM | POA: Diagnosis not present

## 2019-04-09 DIAGNOSIS — D72819 Decreased white blood cell count, unspecified: Secondary | ICD-10-CM | POA: Diagnosis not present

## 2019-04-09 DIAGNOSIS — Z4822 Encounter for aftercare following kidney transplant: Secondary | ICD-10-CM | POA: Diagnosis not present

## 2019-04-09 DIAGNOSIS — D8989 Other specified disorders involving the immune mechanism, not elsewhere classified: Secondary | ICD-10-CM | POA: Diagnosis not present

## 2019-04-09 DIAGNOSIS — E876 Hypokalemia: Secondary | ICD-10-CM | POA: Diagnosis not present

## 2019-04-09 DIAGNOSIS — N3941 Urge incontinence: Secondary | ICD-10-CM | POA: Diagnosis not present

## 2019-04-09 DIAGNOSIS — C61 Malignant neoplasm of prostate: Secondary | ICD-10-CM | POA: Diagnosis not present

## 2019-04-09 DIAGNOSIS — K5909 Other constipation: Secondary | ICD-10-CM | POA: Diagnosis not present

## 2019-04-09 DIAGNOSIS — Z7952 Long term (current) use of systemic steroids: Secondary | ICD-10-CM | POA: Diagnosis not present

## 2019-04-09 DIAGNOSIS — Z5181 Encounter for therapeutic drug level monitoring: Secondary | ICD-10-CM | POA: Diagnosis not present

## 2019-04-09 DIAGNOSIS — Z792 Long term (current) use of antibiotics: Secondary | ICD-10-CM | POA: Diagnosis not present

## 2019-04-09 DIAGNOSIS — Z9079 Acquired absence of other genital organ(s): Secondary | ICD-10-CM | POA: Diagnosis not present

## 2019-04-09 DIAGNOSIS — Z940 Kidney transplant status: Secondary | ICD-10-CM | POA: Diagnosis not present

## 2019-04-16 DIAGNOSIS — Z4822 Encounter for aftercare following kidney transplant: Secondary | ICD-10-CM | POA: Diagnosis not present

## 2019-04-16 DIAGNOSIS — M7989 Other specified soft tissue disorders: Secondary | ICD-10-CM | POA: Diagnosis not present

## 2019-04-16 DIAGNOSIS — Z940 Kidney transplant status: Secondary | ICD-10-CM | POA: Diagnosis not present

## 2019-04-16 DIAGNOSIS — D649 Anemia, unspecified: Secondary | ICD-10-CM | POA: Diagnosis not present

## 2019-04-16 DIAGNOSIS — D708 Other neutropenia: Secondary | ICD-10-CM | POA: Diagnosis not present

## 2019-04-16 DIAGNOSIS — R32 Unspecified urinary incontinence: Secondary | ICD-10-CM | POA: Diagnosis not present

## 2019-04-16 DIAGNOSIS — D899 Disorder involving the immune mechanism, unspecified: Secondary | ICD-10-CM | POA: Diagnosis not present

## 2019-04-16 DIAGNOSIS — Z792 Long term (current) use of antibiotics: Secondary | ICD-10-CM | POA: Diagnosis not present

## 2019-04-16 DIAGNOSIS — Z9079 Acquired absence of other genital organ(s): Secondary | ICD-10-CM | POA: Diagnosis not present

## 2019-04-16 DIAGNOSIS — I10 Essential (primary) hypertension: Secondary | ICD-10-CM | POA: Diagnosis not present

## 2019-04-16 DIAGNOSIS — Z7952 Long term (current) use of systemic steroids: Secondary | ICD-10-CM | POA: Diagnosis not present

## 2019-04-16 DIAGNOSIS — K5900 Constipation, unspecified: Secondary | ICD-10-CM | POA: Diagnosis not present

## 2019-04-16 DIAGNOSIS — Z5181 Encounter for therapeutic drug level monitoring: Secondary | ICD-10-CM | POA: Diagnosis not present

## 2019-04-16 DIAGNOSIS — C61 Malignant neoplasm of prostate: Secondary | ICD-10-CM | POA: Diagnosis not present

## 2019-04-16 DIAGNOSIS — E876 Hypokalemia: Secondary | ICD-10-CM | POA: Diagnosis not present

## 2019-04-16 DIAGNOSIS — Z79899 Other long term (current) drug therapy: Secondary | ICD-10-CM | POA: Diagnosis not present

## 2019-04-23 DIAGNOSIS — Z7952 Long term (current) use of systemic steroids: Secondary | ICD-10-CM | POA: Diagnosis not present

## 2019-04-23 DIAGNOSIS — Z9079 Acquired absence of other genital organ(s): Secondary | ICD-10-CM | POA: Diagnosis not present

## 2019-04-23 DIAGNOSIS — Z5181 Encounter for therapeutic drug level monitoring: Secondary | ICD-10-CM | POA: Diagnosis not present

## 2019-04-23 DIAGNOSIS — Z4822 Encounter for aftercare following kidney transplant: Secondary | ICD-10-CM | POA: Diagnosis not present

## 2019-04-23 DIAGNOSIS — K5909 Other constipation: Secondary | ICD-10-CM | POA: Diagnosis not present

## 2019-04-23 DIAGNOSIS — Z940 Kidney transplant status: Secondary | ICD-10-CM | POA: Diagnosis not present

## 2019-04-23 DIAGNOSIS — I10 Essential (primary) hypertension: Secondary | ICD-10-CM | POA: Diagnosis not present

## 2019-04-23 DIAGNOSIS — E876 Hypokalemia: Secondary | ICD-10-CM | POA: Diagnosis not present

## 2019-04-23 DIAGNOSIS — C61 Malignant neoplasm of prostate: Secondary | ICD-10-CM | POA: Diagnosis not present

## 2019-04-23 DIAGNOSIS — D72819 Decreased white blood cell count, unspecified: Secondary | ICD-10-CM | POA: Diagnosis not present

## 2019-04-23 DIAGNOSIS — I120 Hypertensive chronic kidney disease with stage 5 chronic kidney disease or end stage renal disease: Secondary | ICD-10-CM | POA: Diagnosis not present

## 2019-04-23 DIAGNOSIS — I517 Cardiomegaly: Secondary | ICD-10-CM | POA: Diagnosis not present

## 2019-04-23 DIAGNOSIS — Z79899 Other long term (current) drug therapy: Secondary | ICD-10-CM | POA: Diagnosis not present

## 2019-04-23 DIAGNOSIS — D649 Anemia, unspecified: Secondary | ICD-10-CM | POA: Diagnosis not present

## 2019-04-23 DIAGNOSIS — N186 End stage renal disease: Secondary | ICD-10-CM | POA: Diagnosis not present

## 2019-04-30 DIAGNOSIS — E876 Hypokalemia: Secondary | ICD-10-CM | POA: Diagnosis not present

## 2019-04-30 DIAGNOSIS — Z4822 Encounter for aftercare following kidney transplant: Secondary | ICD-10-CM | POA: Diagnosis not present

## 2019-04-30 DIAGNOSIS — R609 Edema, unspecified: Secondary | ICD-10-CM | POA: Diagnosis not present

## 2019-04-30 DIAGNOSIS — Z792 Long term (current) use of antibiotics: Secondary | ICD-10-CM | POA: Diagnosis not present

## 2019-04-30 DIAGNOSIS — K5909 Other constipation: Secondary | ICD-10-CM | POA: Diagnosis not present

## 2019-04-30 DIAGNOSIS — Z9079 Acquired absence of other genital organ(s): Secondary | ICD-10-CM | POA: Diagnosis not present

## 2019-04-30 DIAGNOSIS — D708 Other neutropenia: Secondary | ICD-10-CM | POA: Diagnosis not present

## 2019-04-30 DIAGNOSIS — D72819 Decreased white blood cell count, unspecified: Secondary | ICD-10-CM | POA: Diagnosis not present

## 2019-04-30 DIAGNOSIS — Z8546 Personal history of malignant neoplasm of prostate: Secondary | ICD-10-CM | POA: Diagnosis not present

## 2019-04-30 DIAGNOSIS — D649 Anemia, unspecified: Secondary | ICD-10-CM | POA: Diagnosis not present

## 2019-04-30 DIAGNOSIS — Z79899 Other long term (current) drug therapy: Secondary | ICD-10-CM | POA: Diagnosis not present

## 2019-04-30 DIAGNOSIS — R32 Unspecified urinary incontinence: Secondary | ICD-10-CM | POA: Diagnosis not present

## 2019-04-30 DIAGNOSIS — Z940 Kidney transplant status: Secondary | ICD-10-CM | POA: Diagnosis not present

## 2019-04-30 DIAGNOSIS — Z7952 Long term (current) use of systemic steroids: Secondary | ICD-10-CM | POA: Diagnosis not present

## 2019-04-30 DIAGNOSIS — R768 Other specified abnormal immunological findings in serum: Secondary | ICD-10-CM | POA: Diagnosis not present

## 2019-04-30 DIAGNOSIS — I10 Essential (primary) hypertension: Secondary | ICD-10-CM | POA: Diagnosis not present

## 2019-04-30 DIAGNOSIS — Z5181 Encounter for therapeutic drug level monitoring: Secondary | ICD-10-CM | POA: Diagnosis not present

## 2019-05-07 DIAGNOSIS — R609 Edema, unspecified: Secondary | ICD-10-CM | POA: Diagnosis not present

## 2019-05-07 DIAGNOSIS — Z940 Kidney transplant status: Secondary | ICD-10-CM | POA: Diagnosis not present

## 2019-05-07 DIAGNOSIS — Z4822 Encounter for aftercare following kidney transplant: Secondary | ICD-10-CM | POA: Diagnosis not present

## 2019-05-11 DIAGNOSIS — T888XXA Other specified complications of surgical and medical care, not elsewhere classified, initial encounter: Secondary | ICD-10-CM | POA: Diagnosis not present

## 2019-05-11 DIAGNOSIS — R7309 Other abnormal glucose: Secondary | ICD-10-CM | POA: Diagnosis not present

## 2019-05-14 DIAGNOSIS — D708 Other neutropenia: Secondary | ICD-10-CM | POA: Diagnosis not present

## 2019-05-14 DIAGNOSIS — Z87891 Personal history of nicotine dependence: Secondary | ICD-10-CM | POA: Diagnosis not present

## 2019-05-14 DIAGNOSIS — I10 Essential (primary) hypertension: Secondary | ICD-10-CM | POA: Diagnosis not present

## 2019-05-14 DIAGNOSIS — D849 Immunodeficiency, unspecified: Secondary | ICD-10-CM | POA: Diagnosis not present

## 2019-05-14 DIAGNOSIS — R7989 Other specified abnormal findings of blood chemistry: Secondary | ICD-10-CM | POA: Diagnosis not present

## 2019-05-14 DIAGNOSIS — Z4822 Encounter for aftercare following kidney transplant: Secondary | ICD-10-CM | POA: Diagnosis not present

## 2019-05-14 DIAGNOSIS — Z940 Kidney transplant status: Secondary | ICD-10-CM | POA: Diagnosis not present

## 2019-05-14 DIAGNOSIS — T888XXA Other specified complications of surgical and medical care, not elsewhere classified, initial encounter: Secondary | ICD-10-CM | POA: Diagnosis not present

## 2019-05-19 DIAGNOSIS — Z4822 Encounter for aftercare following kidney transplant: Secondary | ICD-10-CM | POA: Diagnosis not present

## 2019-05-19 DIAGNOSIS — R7989 Other specified abnormal findings of blood chemistry: Secondary | ICD-10-CM | POA: Diagnosis not present

## 2019-05-19 DIAGNOSIS — Z940 Kidney transplant status: Secondary | ICD-10-CM | POA: Diagnosis not present

## 2019-05-21 DIAGNOSIS — Z4822 Encounter for aftercare following kidney transplant: Secondary | ICD-10-CM | POA: Diagnosis not present

## 2019-05-21 DIAGNOSIS — Z5181 Encounter for therapeutic drug level monitoring: Secondary | ICD-10-CM | POA: Diagnosis not present

## 2019-05-21 DIAGNOSIS — Z940 Kidney transplant status: Secondary | ICD-10-CM | POA: Diagnosis not present

## 2019-05-21 DIAGNOSIS — T8619 Other complication of kidney transplant: Secondary | ICD-10-CM | POA: Diagnosis not present

## 2019-05-21 DIAGNOSIS — Z79899 Other long term (current) drug therapy: Secondary | ICD-10-CM | POA: Diagnosis not present

## 2019-05-21 DIAGNOSIS — R7989 Other specified abnormal findings of blood chemistry: Secondary | ICD-10-CM | POA: Diagnosis not present

## 2019-05-28 DIAGNOSIS — K5909 Other constipation: Secondary | ICD-10-CM | POA: Diagnosis not present

## 2019-05-28 DIAGNOSIS — Z7952 Long term (current) use of systemic steroids: Secondary | ICD-10-CM | POA: Diagnosis not present

## 2019-05-28 DIAGNOSIS — I10 Essential (primary) hypertension: Secondary | ICD-10-CM | POA: Diagnosis not present

## 2019-05-28 DIAGNOSIS — D708 Other neutropenia: Secondary | ICD-10-CM | POA: Diagnosis not present

## 2019-05-28 DIAGNOSIS — E876 Hypokalemia: Secondary | ICD-10-CM | POA: Diagnosis not present

## 2019-05-28 DIAGNOSIS — Z5181 Encounter for therapeutic drug level monitoring: Secondary | ICD-10-CM | POA: Diagnosis not present

## 2019-05-28 DIAGNOSIS — Z4822 Encounter for aftercare following kidney transplant: Secondary | ICD-10-CM | POA: Diagnosis not present

## 2019-05-28 DIAGNOSIS — D849 Immunodeficiency, unspecified: Secondary | ICD-10-CM | POA: Diagnosis not present

## 2019-05-28 DIAGNOSIS — R600 Localized edema: Secondary | ICD-10-CM | POA: Diagnosis not present

## 2019-05-28 DIAGNOSIS — D649 Anemia, unspecified: Secondary | ICD-10-CM | POA: Diagnosis not present

## 2019-05-28 DIAGNOSIS — Z79899 Other long term (current) drug therapy: Secondary | ICD-10-CM | POA: Diagnosis not present

## 2019-05-28 DIAGNOSIS — Z792 Long term (current) use of antibiotics: Secondary | ICD-10-CM | POA: Diagnosis not present

## 2019-05-28 DIAGNOSIS — Z940 Kidney transplant status: Secondary | ICD-10-CM | POA: Diagnosis not present

## 2019-05-28 DIAGNOSIS — C61 Malignant neoplasm of prostate: Secondary | ICD-10-CM | POA: Diagnosis not present

## 2019-05-28 DIAGNOSIS — D72819 Decreased white blood cell count, unspecified: Secondary | ICD-10-CM | POA: Diagnosis not present

## 2019-05-28 DIAGNOSIS — R32 Unspecified urinary incontinence: Secondary | ICD-10-CM | POA: Diagnosis not present

## 2019-05-28 DIAGNOSIS — Z9079 Acquired absence of other genital organ(s): Secondary | ICD-10-CM | POA: Diagnosis not present

## 2019-06-01 DIAGNOSIS — Z4803 Encounter for change or removal of drains: Secondary | ICD-10-CM | POA: Diagnosis not present

## 2019-06-01 DIAGNOSIS — T888XXA Other specified complications of surgical and medical care, not elsewhere classified, initial encounter: Secondary | ICD-10-CM | POA: Diagnosis not present

## 2019-06-01 DIAGNOSIS — R600 Localized edema: Secondary | ICD-10-CM | POA: Diagnosis not present

## 2019-06-11 DIAGNOSIS — Z9079 Acquired absence of other genital organ(s): Secondary | ICD-10-CM | POA: Diagnosis not present

## 2019-06-11 DIAGNOSIS — D708 Other neutropenia: Secondary | ICD-10-CM | POA: Diagnosis not present

## 2019-06-11 DIAGNOSIS — D849 Immunodeficiency, unspecified: Secondary | ICD-10-CM | POA: Diagnosis not present

## 2019-06-11 DIAGNOSIS — Z4822 Encounter for aftercare following kidney transplant: Secondary | ICD-10-CM | POA: Diagnosis not present

## 2019-06-11 DIAGNOSIS — K5909 Other constipation: Secondary | ICD-10-CM | POA: Diagnosis not present

## 2019-06-11 DIAGNOSIS — E876 Hypokalemia: Secondary | ICD-10-CM | POA: Diagnosis not present

## 2019-06-11 DIAGNOSIS — I10 Essential (primary) hypertension: Secondary | ICD-10-CM | POA: Diagnosis not present

## 2019-06-11 DIAGNOSIS — Z940 Kidney transplant status: Secondary | ICD-10-CM | POA: Diagnosis not present

## 2019-06-11 DIAGNOSIS — Z79899 Other long term (current) drug therapy: Secondary | ICD-10-CM | POA: Diagnosis not present

## 2019-06-11 DIAGNOSIS — R7989 Other specified abnormal findings of blood chemistry: Secondary | ICD-10-CM | POA: Diagnosis not present

## 2019-06-11 DIAGNOSIS — D513 Other dietary vitamin B12 deficiency anemia: Secondary | ICD-10-CM | POA: Diagnosis not present

## 2019-06-11 DIAGNOSIS — C61 Malignant neoplasm of prostate: Secondary | ICD-10-CM | POA: Diagnosis not present

## 2019-06-11 DIAGNOSIS — I120 Hypertensive chronic kidney disease with stage 5 chronic kidney disease or end stage renal disease: Secondary | ICD-10-CM | POA: Diagnosis not present

## 2019-06-11 DIAGNOSIS — N186 End stage renal disease: Secondary | ICD-10-CM | POA: Diagnosis not present

## 2019-06-11 DIAGNOSIS — R32 Unspecified urinary incontinence: Secondary | ICD-10-CM | POA: Diagnosis not present

## 2019-06-26 DIAGNOSIS — D72819 Decreased white blood cell count, unspecified: Secondary | ICD-10-CM | POA: Diagnosis not present

## 2019-06-26 DIAGNOSIS — D649 Anemia, unspecified: Secondary | ICD-10-CM | POA: Diagnosis not present

## 2019-06-26 DIAGNOSIS — Z8546 Personal history of malignant neoplasm of prostate: Secondary | ICD-10-CM | POA: Diagnosis not present

## 2019-06-26 DIAGNOSIS — R7989 Other specified abnormal findings of blood chemistry: Secondary | ICD-10-CM | POA: Diagnosis not present

## 2019-06-26 DIAGNOSIS — Z9079 Acquired absence of other genital organ(s): Secondary | ICD-10-CM | POA: Diagnosis not present

## 2019-06-26 DIAGNOSIS — Z5181 Encounter for therapeutic drug level monitoring: Secondary | ICD-10-CM | POA: Diagnosis not present

## 2019-06-26 DIAGNOSIS — D631 Anemia in chronic kidney disease: Secondary | ICD-10-CM | POA: Diagnosis not present

## 2019-06-26 DIAGNOSIS — K5909 Other constipation: Secondary | ICD-10-CM | POA: Diagnosis not present

## 2019-06-26 DIAGNOSIS — Z87448 Personal history of other diseases of urinary system: Secondary | ICD-10-CM | POA: Diagnosis not present

## 2019-06-26 DIAGNOSIS — Z940 Kidney transplant status: Secondary | ICD-10-CM | POA: Diagnosis not present

## 2019-06-26 DIAGNOSIS — Z8619 Personal history of other infectious and parasitic diseases: Secondary | ICD-10-CM | POA: Diagnosis not present

## 2019-06-26 DIAGNOSIS — Z792 Long term (current) use of antibiotics: Secondary | ICD-10-CM | POA: Diagnosis not present

## 2019-06-26 DIAGNOSIS — N184 Chronic kidney disease, stage 4 (severe): Secondary | ICD-10-CM | POA: Diagnosis not present

## 2019-06-26 DIAGNOSIS — Z79899 Other long term (current) drug therapy: Secondary | ICD-10-CM | POA: Diagnosis not present

## 2019-06-26 DIAGNOSIS — I10 Essential (primary) hypertension: Secondary | ICD-10-CM | POA: Diagnosis not present

## 2019-06-26 DIAGNOSIS — D61818 Other pancytopenia: Secondary | ICD-10-CM | POA: Diagnosis not present

## 2019-06-26 DIAGNOSIS — Z7952 Long term (current) use of systemic steroids: Secondary | ICD-10-CM | POA: Diagnosis not present

## 2019-06-26 DIAGNOSIS — E876 Hypokalemia: Secondary | ICD-10-CM | POA: Diagnosis not present

## 2019-06-26 DIAGNOSIS — R600 Localized edema: Secondary | ICD-10-CM | POA: Diagnosis not present

## 2019-06-26 DIAGNOSIS — D708 Other neutropenia: Secondary | ICD-10-CM | POA: Diagnosis not present

## 2019-06-26 DIAGNOSIS — Z4822 Encounter for aftercare following kidney transplant: Secondary | ICD-10-CM | POA: Diagnosis not present

## 2019-08-06 DIAGNOSIS — D8989 Other specified disorders involving the immune mechanism, not elsewhere classified: Secondary | ICD-10-CM | POA: Diagnosis not present

## 2019-08-06 DIAGNOSIS — E876 Hypokalemia: Secondary | ICD-10-CM | POA: Diagnosis not present

## 2019-08-06 DIAGNOSIS — Z4822 Encounter for aftercare following kidney transplant: Secondary | ICD-10-CM | POA: Diagnosis not present

## 2019-08-06 DIAGNOSIS — Z940 Kidney transplant status: Secondary | ICD-10-CM | POA: Diagnosis not present

## 2019-08-06 DIAGNOSIS — D631 Anemia in chronic kidney disease: Secondary | ICD-10-CM | POA: Diagnosis not present

## 2019-08-06 DIAGNOSIS — Z7952 Long term (current) use of systemic steroids: Secondary | ICD-10-CM | POA: Diagnosis not present

## 2019-08-06 DIAGNOSIS — R32 Unspecified urinary incontinence: Secondary | ICD-10-CM | POA: Diagnosis not present

## 2019-08-06 DIAGNOSIS — I120 Hypertensive chronic kidney disease with stage 5 chronic kidney disease or end stage renal disease: Secondary | ICD-10-CM | POA: Diagnosis not present

## 2019-08-06 DIAGNOSIS — N186 End stage renal disease: Secondary | ICD-10-CM | POA: Diagnosis not present

## 2019-08-06 DIAGNOSIS — N289 Disorder of kidney and ureter, unspecified: Secondary | ICD-10-CM | POA: Diagnosis not present

## 2019-08-06 DIAGNOSIS — D649 Anemia, unspecified: Secondary | ICD-10-CM | POA: Diagnosis not present

## 2019-08-06 DIAGNOSIS — R609 Edema, unspecified: Secondary | ICD-10-CM | POA: Diagnosis not present

## 2019-08-06 DIAGNOSIS — R600 Localized edema: Secondary | ICD-10-CM | POA: Diagnosis not present

## 2019-08-06 DIAGNOSIS — K5900 Constipation, unspecified: Secondary | ICD-10-CM | POA: Diagnosis not present

## 2019-08-06 DIAGNOSIS — Z9079 Acquired absence of other genital organ(s): Secondary | ICD-10-CM | POA: Diagnosis not present

## 2019-08-06 DIAGNOSIS — K5909 Other constipation: Secondary | ICD-10-CM | POA: Diagnosis not present

## 2019-08-06 DIAGNOSIS — Z8546 Personal history of malignant neoplasm of prostate: Secondary | ICD-10-CM | POA: Diagnosis not present

## 2019-08-06 DIAGNOSIS — I10 Essential (primary) hypertension: Secondary | ICD-10-CM | POA: Diagnosis not present

## 2019-08-06 DIAGNOSIS — T8610 Unspecified complication of kidney transplant: Secondary | ICD-10-CM | POA: Diagnosis not present

## 2019-08-06 DIAGNOSIS — D72819 Decreased white blood cell count, unspecified: Secondary | ICD-10-CM | POA: Diagnosis not present

## 2019-08-06 DIAGNOSIS — Z79899 Other long term (current) drug therapy: Secondary | ICD-10-CM | POA: Diagnosis not present

## 2019-08-06 DIAGNOSIS — Z792 Long term (current) use of antibiotics: Secondary | ICD-10-CM | POA: Diagnosis not present

## 2019-08-20 DIAGNOSIS — R600 Localized edema: Secondary | ICD-10-CM | POA: Diagnosis not present

## 2019-08-20 DIAGNOSIS — R972 Elevated prostate specific antigen [PSA]: Secondary | ICD-10-CM | POA: Diagnosis not present

## 2019-08-20 DIAGNOSIS — Z940 Kidney transplant status: Secondary | ICD-10-CM | POA: Diagnosis not present

## 2019-08-20 DIAGNOSIS — D72819 Decreased white blood cell count, unspecified: Secondary | ICD-10-CM | POA: Diagnosis not present

## 2019-08-20 DIAGNOSIS — K5909 Other constipation: Secondary | ICD-10-CM | POA: Diagnosis not present

## 2019-08-20 DIAGNOSIS — Z792 Long term (current) use of antibiotics: Secondary | ICD-10-CM | POA: Diagnosis not present

## 2019-08-20 DIAGNOSIS — Z7952 Long term (current) use of systemic steroids: Secondary | ICD-10-CM | POA: Diagnosis not present

## 2019-08-20 DIAGNOSIS — Z79899 Other long term (current) drug therapy: Secondary | ICD-10-CM | POA: Diagnosis not present

## 2019-08-20 DIAGNOSIS — Z9079 Acquired absence of other genital organ(s): Secondary | ICD-10-CM | POA: Diagnosis not present

## 2019-08-20 DIAGNOSIS — Z4822 Encounter for aftercare following kidney transplant: Secondary | ICD-10-CM | POA: Diagnosis not present

## 2019-08-20 DIAGNOSIS — D649 Anemia, unspecified: Secondary | ICD-10-CM | POA: Diagnosis not present

## 2019-08-20 DIAGNOSIS — Z87891 Personal history of nicotine dependence: Secondary | ICD-10-CM | POA: Diagnosis not present

## 2019-08-20 DIAGNOSIS — I10 Essential (primary) hypertension: Secondary | ICD-10-CM | POA: Diagnosis not present

## 2019-08-20 DIAGNOSIS — Z8679 Personal history of other diseases of the circulatory system: Secondary | ICD-10-CM | POA: Diagnosis not present

## 2019-08-20 DIAGNOSIS — N3942 Incontinence without sensory awareness: Secondary | ICD-10-CM | POA: Diagnosis not present

## 2019-08-20 DIAGNOSIS — C61 Malignant neoplasm of prostate: Secondary | ICD-10-CM | POA: Diagnosis not present

## 2019-08-20 DIAGNOSIS — Z5181 Encounter for therapeutic drug level monitoring: Secondary | ICD-10-CM | POA: Diagnosis not present

## 2019-08-20 DIAGNOSIS — D849 Immunodeficiency, unspecified: Secondary | ICD-10-CM | POA: Diagnosis not present

## 2019-08-20 DIAGNOSIS — E876 Hypokalemia: Secondary | ICD-10-CM | POA: Diagnosis not present

## 2019-09-03 DIAGNOSIS — Z9079 Acquired absence of other genital organ(s): Secondary | ICD-10-CM | POA: Diagnosis not present

## 2019-09-03 DIAGNOSIS — Z4822 Encounter for aftercare following kidney transplant: Secondary | ICD-10-CM | POA: Diagnosis not present

## 2019-09-03 DIAGNOSIS — N186 End stage renal disease: Secondary | ICD-10-CM | POA: Diagnosis not present

## 2019-09-03 DIAGNOSIS — Z5181 Encounter for therapeutic drug level monitoring: Secondary | ICD-10-CM | POA: Diagnosis not present

## 2019-09-03 DIAGNOSIS — K5900 Constipation, unspecified: Secondary | ICD-10-CM | POA: Diagnosis not present

## 2019-09-03 DIAGNOSIS — R600 Localized edema: Secondary | ICD-10-CM | POA: Diagnosis not present

## 2019-09-03 DIAGNOSIS — R609 Edema, unspecified: Secondary | ICD-10-CM | POA: Diagnosis not present

## 2019-09-03 DIAGNOSIS — Z7952 Long term (current) use of systemic steroids: Secondary | ICD-10-CM | POA: Diagnosis not present

## 2019-09-03 DIAGNOSIS — K5909 Other constipation: Secondary | ICD-10-CM | POA: Diagnosis not present

## 2019-09-03 DIAGNOSIS — Z792 Long term (current) use of antibiotics: Secondary | ICD-10-CM | POA: Diagnosis not present

## 2019-09-03 DIAGNOSIS — D649 Anemia, unspecified: Secondary | ICD-10-CM | POA: Diagnosis not present

## 2019-09-03 DIAGNOSIS — R32 Unspecified urinary incontinence: Secondary | ICD-10-CM | POA: Diagnosis not present

## 2019-09-03 DIAGNOSIS — Z940 Kidney transplant status: Secondary | ICD-10-CM | POA: Diagnosis not present

## 2019-09-03 DIAGNOSIS — I10 Essential (primary) hypertension: Secondary | ICD-10-CM | POA: Diagnosis not present

## 2019-09-03 DIAGNOSIS — E876 Hypokalemia: Secondary | ICD-10-CM | POA: Diagnosis not present

## 2019-09-03 DIAGNOSIS — Z79899 Other long term (current) drug therapy: Secondary | ICD-10-CM | POA: Diagnosis not present

## 2019-09-03 DIAGNOSIS — D631 Anemia in chronic kidney disease: Secondary | ICD-10-CM | POA: Diagnosis not present

## 2019-09-03 DIAGNOSIS — D72819 Decreased white blood cell count, unspecified: Secondary | ICD-10-CM | POA: Diagnosis not present

## 2019-09-03 DIAGNOSIS — R197 Diarrhea, unspecified: Secondary | ICD-10-CM | POA: Diagnosis not present

## 2019-09-03 DIAGNOSIS — Z8546 Personal history of malignant neoplasm of prostate: Secondary | ICD-10-CM | POA: Diagnosis not present

## 2019-09-09 DIAGNOSIS — Z940 Kidney transplant status: Secondary | ICD-10-CM | POA: Diagnosis not present

## 2019-09-09 DIAGNOSIS — N400 Enlarged prostate without lower urinary tract symptoms: Secondary | ICD-10-CM | POA: Diagnosis not present

## 2019-09-09 DIAGNOSIS — I739 Peripheral vascular disease, unspecified: Secondary | ICD-10-CM | POA: Diagnosis not present

## 2019-09-09 DIAGNOSIS — D638 Anemia in other chronic diseases classified elsewhere: Secondary | ICD-10-CM | POA: Diagnosis not present

## 2019-09-09 DIAGNOSIS — Z125 Encounter for screening for malignant neoplasm of prostate: Secondary | ICD-10-CM | POA: Diagnosis not present

## 2019-09-09 DIAGNOSIS — K581 Irritable bowel syndrome with constipation: Secondary | ICD-10-CM | POA: Diagnosis not present

## 2019-09-09 DIAGNOSIS — I10 Essential (primary) hypertension: Secondary | ICD-10-CM | POA: Diagnosis not present

## 2019-09-09 DIAGNOSIS — N529 Male erectile dysfunction, unspecified: Secondary | ICD-10-CM | POA: Diagnosis not present

## 2019-09-17 DIAGNOSIS — Z4822 Encounter for aftercare following kidney transplant: Secondary | ICD-10-CM | POA: Diagnosis not present

## 2019-09-17 DIAGNOSIS — Z792 Long term (current) use of antibiotics: Secondary | ICD-10-CM | POA: Diagnosis not present

## 2019-09-17 DIAGNOSIS — R52 Pain, unspecified: Secondary | ICD-10-CM | POA: Diagnosis not present

## 2019-09-17 DIAGNOSIS — D631 Anemia in chronic kidney disease: Secondary | ICD-10-CM | POA: Diagnosis not present

## 2019-09-17 DIAGNOSIS — E785 Hyperlipidemia, unspecified: Secondary | ICD-10-CM | POA: Diagnosis not present

## 2019-09-17 DIAGNOSIS — N186 End stage renal disease: Secondary | ICD-10-CM | POA: Diagnosis not present

## 2019-09-17 DIAGNOSIS — B259 Cytomegaloviral disease, unspecified: Secondary | ICD-10-CM | POA: Diagnosis not present

## 2019-09-18 ENCOUNTER — Ambulatory Visit: Payer: Medicare Other | Attending: Internal Medicine

## 2019-09-18 DIAGNOSIS — Z23 Encounter for immunization: Secondary | ICD-10-CM | POA: Insufficient documentation

## 2019-09-18 NOTE — Progress Notes (Signed)
   Covid-19 Vaccination Clinic  Name:  Andrew Brandt    MRN: [id number] DOB: 08-18-1950  09/18/2019  [name] was observed post Covid-19 immunization for 15 minutes without incidence. He was provided with Vaccine Information Sheet and instruction to access the V-Safe system.   [name] was instructed to call 911 with any severe reactions post vaccine: Marland Kitchen Difficulty breathing  . Swelling of your face and throat  . A fast heartbeat  . A bad rash all over your body  . Dizziness and weakness    Immunizations Administered    Name Date Dose VIS Date Route   Pfizer COVID-19 Vaccine 09/18/2019 10:15 AM 0.3 mL 07/03/2019 Intramuscular   Manufacturer: [place]   Lot: [id number]   [place]: [id number]

## 2019-10-13 ENCOUNTER — Ambulatory Visit: Payer: Medicare Other | Attending: Internal Medicine

## 2019-10-13 DIAGNOSIS — Z23 Encounter for immunization: Secondary | ICD-10-CM

## 2019-10-13 NOTE — Progress Notes (Signed)
   Covid-19 Vaccination Clinic  Name:  Andrew Brandt    MRN: [id number] DOB: 09/13/1950  10/13/2019  Mr. Andrew Brandt was observed post Covid-19 immunization for 15 minutes without incident. He was provided with Vaccine Information Sheet and instruction to access the V-Safe system.   Mr. Andrew Brandt was instructed to call 911 with any severe reactions post vaccine: Marland Kitchen Difficulty breathing  . Swelling of face and throat  . A fast heartbeat  . A bad rash all over body  . Dizziness and weakness   Immunizations Administered    Name Date Dose VIS Date Route   Pfizer COVID-19 Vaccine 10/13/2019 11:49 AM 0.3 mL 07/03/2019 Intramuscular   Manufacturer: [place]   Lot: [id number]   [place]: [id number]

## 2019-10-15 DIAGNOSIS — D649 Anemia, unspecified: Secondary | ICD-10-CM | POA: Diagnosis not present

## 2019-10-15 DIAGNOSIS — Z9079 Acquired absence of other genital organ(s): Secondary | ICD-10-CM | POA: Diagnosis not present

## 2019-10-15 DIAGNOSIS — Z940 Kidney transplant status: Secondary | ICD-10-CM | POA: Diagnosis not present

## 2019-10-15 DIAGNOSIS — Z8546 Personal history of malignant neoplasm of prostate: Secondary | ICD-10-CM | POA: Diagnosis not present

## 2019-10-15 DIAGNOSIS — Z4822 Encounter for aftercare following kidney transplant: Secondary | ICD-10-CM | POA: Diagnosis not present

## 2019-10-15 DIAGNOSIS — Z5181 Encounter for therapeutic drug level monitoring: Secondary | ICD-10-CM | POA: Diagnosis not present

## 2019-10-15 DIAGNOSIS — Z792 Long term (current) use of antibiotics: Secondary | ICD-10-CM | POA: Diagnosis not present

## 2019-10-15 DIAGNOSIS — D72819 Decreased white blood cell count, unspecified: Secondary | ICD-10-CM | POA: Diagnosis not present

## 2019-10-15 DIAGNOSIS — I10 Essential (primary) hypertension: Secondary | ICD-10-CM | POA: Diagnosis not present

## 2019-10-15 DIAGNOSIS — E876 Hypokalemia: Secondary | ICD-10-CM | POA: Diagnosis not present

## 2019-10-15 DIAGNOSIS — Z7952 Long term (current) use of systemic steroids: Secondary | ICD-10-CM | POA: Diagnosis not present

## 2019-10-15 DIAGNOSIS — Z8679 Personal history of other diseases of the circulatory system: Secondary | ICD-10-CM | POA: Diagnosis not present

## 2019-10-15 DIAGNOSIS — Z79899 Other long term (current) drug therapy: Secondary | ICD-10-CM | POA: Diagnosis not present

## 2019-11-12 DIAGNOSIS — K5900 Constipation, unspecified: Secondary | ICD-10-CM | POA: Diagnosis not present

## 2019-11-12 DIAGNOSIS — D72819 Decreased white blood cell count, unspecified: Secondary | ICD-10-CM | POA: Diagnosis not present

## 2019-11-12 DIAGNOSIS — Z9079 Acquired absence of other genital organ(s): Secondary | ICD-10-CM | POA: Diagnosis not present

## 2019-11-12 DIAGNOSIS — Z4822 Encounter for aftercare following kidney transplant: Secondary | ICD-10-CM | POA: Diagnosis not present

## 2019-11-12 DIAGNOSIS — Z792 Long term (current) use of antibiotics: Secondary | ICD-10-CM | POA: Diagnosis not present

## 2019-11-12 DIAGNOSIS — T8610 Unspecified complication of kidney transplant: Secondary | ICD-10-CM | POA: Diagnosis not present

## 2019-11-12 DIAGNOSIS — D649 Anemia, unspecified: Secondary | ICD-10-CM | POA: Diagnosis not present

## 2019-11-12 DIAGNOSIS — Z79899 Other long term (current) drug therapy: Secondary | ICD-10-CM | POA: Diagnosis not present

## 2019-11-12 DIAGNOSIS — C61 Malignant neoplasm of prostate: Secondary | ICD-10-CM | POA: Diagnosis not present

## 2019-11-12 DIAGNOSIS — Z940 Kidney transplant status: Secondary | ICD-10-CM | POA: Diagnosis not present

## 2019-11-12 DIAGNOSIS — I10 Essential (primary) hypertension: Secondary | ICD-10-CM | POA: Diagnosis not present

## 2019-11-12 DIAGNOSIS — Z7952 Long term (current) use of systemic steroids: Secondary | ICD-10-CM | POA: Diagnosis not present

## 2019-12-10 DIAGNOSIS — R972 Elevated prostate specific antigen [PSA]: Secondary | ICD-10-CM | POA: Diagnosis not present

## 2019-12-10 DIAGNOSIS — Z4822 Encounter for aftercare following kidney transplant: Secondary | ICD-10-CM | POA: Diagnosis not present

## 2019-12-10 DIAGNOSIS — D649 Anemia, unspecified: Secondary | ICD-10-CM | POA: Diagnosis not present

## 2019-12-10 DIAGNOSIS — Z8679 Personal history of other diseases of the circulatory system: Secondary | ICD-10-CM | POA: Diagnosis not present

## 2019-12-10 DIAGNOSIS — D696 Thrombocytopenia, unspecified: Secondary | ICD-10-CM | POA: Diagnosis not present

## 2019-12-10 DIAGNOSIS — Z7952 Long term (current) use of systemic steroids: Secondary | ICD-10-CM | POA: Diagnosis not present

## 2019-12-10 DIAGNOSIS — R32 Unspecified urinary incontinence: Secondary | ICD-10-CM | POA: Diagnosis not present

## 2019-12-10 DIAGNOSIS — I10 Essential (primary) hypertension: Secondary | ICD-10-CM | POA: Diagnosis not present

## 2019-12-10 DIAGNOSIS — D72819 Decreased white blood cell count, unspecified: Secondary | ICD-10-CM | POA: Diagnosis not present

## 2019-12-10 DIAGNOSIS — Z940 Kidney transplant status: Secondary | ICD-10-CM | POA: Diagnosis not present

## 2019-12-10 DIAGNOSIS — D708 Other neutropenia: Secondary | ICD-10-CM | POA: Diagnosis not present

## 2019-12-10 DIAGNOSIS — E876 Hypokalemia: Secondary | ICD-10-CM | POA: Diagnosis not present

## 2019-12-10 DIAGNOSIS — D849 Immunodeficiency, unspecified: Secondary | ICD-10-CM | POA: Diagnosis not present

## 2019-12-10 DIAGNOSIS — Z792 Long term (current) use of antibiotics: Secondary | ICD-10-CM | POA: Diagnosis not present

## 2019-12-10 DIAGNOSIS — Z79899 Other long term (current) drug therapy: Secondary | ICD-10-CM | POA: Diagnosis not present

## 2019-12-10 DIAGNOSIS — C61 Malignant neoplasm of prostate: Secondary | ICD-10-CM | POA: Diagnosis not present

## 2019-12-10 DIAGNOSIS — Z5181 Encounter for therapeutic drug level monitoring: Secondary | ICD-10-CM | POA: Diagnosis not present

## 2019-12-10 DIAGNOSIS — R600 Localized edema: Secondary | ICD-10-CM | POA: Diagnosis not present

## 2019-12-10 DIAGNOSIS — K5909 Other constipation: Secondary | ICD-10-CM | POA: Diagnosis not present

## 2019-12-11 DIAGNOSIS — I739 Peripheral vascular disease, unspecified: Secondary | ICD-10-CM | POA: Diagnosis not present

## 2019-12-11 DIAGNOSIS — D638 Anemia in other chronic diseases classified elsewhere: Secondary | ICD-10-CM | POA: Diagnosis not present

## 2019-12-11 DIAGNOSIS — N529 Male erectile dysfunction, unspecified: Secondary | ICD-10-CM | POA: Diagnosis not present

## 2019-12-11 DIAGNOSIS — I10 Essential (primary) hypertension: Secondary | ICD-10-CM | POA: Diagnosis not present

## 2019-12-11 DIAGNOSIS — Z0001 Encounter for general adult medical examination with abnormal findings: Secondary | ICD-10-CM | POA: Diagnosis not present

## 2019-12-11 DIAGNOSIS — K581 Irritable bowel syndrome with constipation: Secondary | ICD-10-CM | POA: Diagnosis not present

## 2019-12-11 DIAGNOSIS — Z940 Kidney transplant status: Secondary | ICD-10-CM | POA: Diagnosis not present

## 2019-12-11 DIAGNOSIS — N400 Enlarged prostate without lower urinary tract symptoms: Secondary | ICD-10-CM | POA: Diagnosis not present

## 2020-01-07 DIAGNOSIS — Z4822 Encounter for aftercare following kidney transplant: Secondary | ICD-10-CM | POA: Diagnosis not present

## 2020-01-07 DIAGNOSIS — R600 Localized edema: Secondary | ICD-10-CM | POA: Diagnosis not present

## 2020-01-07 DIAGNOSIS — C61 Malignant neoplasm of prostate: Secondary | ICD-10-CM | POA: Diagnosis not present

## 2020-01-07 DIAGNOSIS — Z9079 Acquired absence of other genital organ(s): Secondary | ICD-10-CM | POA: Diagnosis not present

## 2020-01-07 DIAGNOSIS — E876 Hypokalemia: Secondary | ICD-10-CM | POA: Diagnosis not present

## 2020-01-07 DIAGNOSIS — Z940 Kidney transplant status: Secondary | ICD-10-CM | POA: Diagnosis not present

## 2020-01-07 DIAGNOSIS — K5900 Constipation, unspecified: Secondary | ICD-10-CM | POA: Diagnosis not present

## 2020-01-07 DIAGNOSIS — Z79899 Other long term (current) drug therapy: Secondary | ICD-10-CM | POA: Diagnosis not present

## 2020-01-07 DIAGNOSIS — D849 Immunodeficiency, unspecified: Secondary | ICD-10-CM | POA: Diagnosis not present

## 2020-01-07 DIAGNOSIS — D72819 Decreased white blood cell count, unspecified: Secondary | ICD-10-CM | POA: Diagnosis not present

## 2020-01-07 DIAGNOSIS — D649 Anemia, unspecified: Secondary | ICD-10-CM | POA: Diagnosis not present

## 2020-01-07 DIAGNOSIS — Z7952 Long term (current) use of systemic steroids: Secondary | ICD-10-CM | POA: Diagnosis not present

## 2020-01-07 DIAGNOSIS — I10 Essential (primary) hypertension: Secondary | ICD-10-CM | POA: Diagnosis not present

## 2020-01-07 DIAGNOSIS — Z792 Long term (current) use of antibiotics: Secondary | ICD-10-CM | POA: Diagnosis not present

## 2020-02-04 DIAGNOSIS — E8770 Fluid overload, unspecified: Secondary | ICD-10-CM | POA: Diagnosis not present

## 2020-02-04 DIAGNOSIS — K5909 Other constipation: Secondary | ICD-10-CM | POA: Diagnosis not present

## 2020-02-04 DIAGNOSIS — C61 Malignant neoplasm of prostate: Secondary | ICD-10-CM | POA: Diagnosis not present

## 2020-02-04 DIAGNOSIS — D649 Anemia, unspecified: Secondary | ICD-10-CM | POA: Diagnosis not present

## 2020-02-04 DIAGNOSIS — Z79899 Other long term (current) drug therapy: Secondary | ICD-10-CM | POA: Diagnosis not present

## 2020-02-04 DIAGNOSIS — Z7952 Long term (current) use of systemic steroids: Secondary | ICD-10-CM | POA: Diagnosis not present

## 2020-02-04 DIAGNOSIS — D72819 Decreased white blood cell count, unspecified: Secondary | ICD-10-CM | POA: Diagnosis not present

## 2020-02-04 DIAGNOSIS — Z792 Long term (current) use of antibiotics: Secondary | ICD-10-CM | POA: Diagnosis not present

## 2020-02-04 DIAGNOSIS — Z4822 Encounter for aftercare following kidney transplant: Secondary | ICD-10-CM | POA: Diagnosis not present

## 2020-02-04 DIAGNOSIS — R197 Diarrhea, unspecified: Secondary | ICD-10-CM | POA: Diagnosis not present

## 2020-02-04 DIAGNOSIS — I10 Essential (primary) hypertension: Secondary | ICD-10-CM | POA: Diagnosis not present

## 2020-03-03 DIAGNOSIS — R32 Unspecified urinary incontinence: Secondary | ICD-10-CM | POA: Diagnosis not present

## 2020-03-03 DIAGNOSIS — Z79899 Other long term (current) drug therapy: Secondary | ICD-10-CM | POA: Diagnosis not present

## 2020-03-03 DIAGNOSIS — Z940 Kidney transplant status: Secondary | ICD-10-CM | POA: Diagnosis not present

## 2020-03-03 DIAGNOSIS — D84821 Immunodeficiency due to drugs: Secondary | ICD-10-CM | POA: Diagnosis not present

## 2020-03-03 DIAGNOSIS — K5900 Constipation, unspecified: Secondary | ICD-10-CM | POA: Diagnosis not present

## 2020-03-03 DIAGNOSIS — Z4822 Encounter for aftercare following kidney transplant: Secondary | ICD-10-CM | POA: Diagnosis not present

## 2020-03-03 DIAGNOSIS — Z7952 Long term (current) use of systemic steroids: Secondary | ICD-10-CM | POA: Diagnosis not present

## 2020-03-03 DIAGNOSIS — Z8546 Personal history of malignant neoplasm of prostate: Secondary | ICD-10-CM | POA: Diagnosis not present

## 2020-03-03 DIAGNOSIS — D72819 Decreased white blood cell count, unspecified: Secondary | ICD-10-CM | POA: Diagnosis not present

## 2020-03-03 DIAGNOSIS — C61 Malignant neoplasm of prostate: Secondary | ICD-10-CM | POA: Diagnosis not present

## 2020-03-03 DIAGNOSIS — Z792 Long term (current) use of antibiotics: Secondary | ICD-10-CM | POA: Diagnosis not present

## 2020-03-03 DIAGNOSIS — I10 Essential (primary) hypertension: Secondary | ICD-10-CM | POA: Diagnosis not present

## 2020-03-03 DIAGNOSIS — Z8679 Personal history of other diseases of the circulatory system: Secondary | ICD-10-CM | POA: Diagnosis not present

## 2020-03-03 DIAGNOSIS — D649 Anemia, unspecified: Secondary | ICD-10-CM | POA: Diagnosis not present

## 2020-03-03 DIAGNOSIS — Z5181 Encounter for therapeutic drug level monitoring: Secondary | ICD-10-CM | POA: Diagnosis not present

## 2020-03-03 DIAGNOSIS — N529 Male erectile dysfunction, unspecified: Secondary | ICD-10-CM | POA: Diagnosis not present

## 2020-03-31 DIAGNOSIS — Z4822 Encounter for aftercare following kidney transplant: Secondary | ICD-10-CM | POA: Diagnosis not present

## 2020-04-06 DIAGNOSIS — N400 Enlarged prostate without lower urinary tract symptoms: Secondary | ICD-10-CM | POA: Diagnosis not present

## 2020-04-06 DIAGNOSIS — Z940 Kidney transplant status: Secondary | ICD-10-CM | POA: Diagnosis not present

## 2020-04-06 DIAGNOSIS — Z131 Encounter for screening for diabetes mellitus: Secondary | ICD-10-CM | POA: Diagnosis not present

## 2020-04-06 DIAGNOSIS — I10 Essential (primary) hypertension: Secondary | ICD-10-CM | POA: Diagnosis not present

## 2020-04-06 DIAGNOSIS — K581 Irritable bowel syndrome with constipation: Secondary | ICD-10-CM | POA: Diagnosis not present

## 2020-04-06 DIAGNOSIS — D638 Anemia in other chronic diseases classified elsewhere: Secondary | ICD-10-CM | POA: Diagnosis not present

## 2020-04-06 DIAGNOSIS — I739 Peripheral vascular disease, unspecified: Secondary | ICD-10-CM | POA: Diagnosis not present

## 2020-04-06 DIAGNOSIS — N529 Male erectile dysfunction, unspecified: Secondary | ICD-10-CM | POA: Diagnosis not present

## 2020-04-28 DIAGNOSIS — Z79899 Other long term (current) drug therapy: Secondary | ICD-10-CM | POA: Diagnosis not present

## 2020-04-28 DIAGNOSIS — E8770 Fluid overload, unspecified: Secondary | ICD-10-CM | POA: Diagnosis not present

## 2020-04-28 DIAGNOSIS — Z792 Long term (current) use of antibiotics: Secondary | ICD-10-CM | POA: Diagnosis not present

## 2020-04-28 DIAGNOSIS — D708 Other neutropenia: Secondary | ICD-10-CM | POA: Diagnosis not present

## 2020-04-28 DIAGNOSIS — Z4822 Encounter for aftercare following kidney transplant: Secondary | ICD-10-CM | POA: Diagnosis not present

## 2020-04-28 DIAGNOSIS — D849 Immunodeficiency, unspecified: Secondary | ICD-10-CM | POA: Diagnosis not present

## 2020-04-28 DIAGNOSIS — R32 Unspecified urinary incontinence: Secondary | ICD-10-CM | POA: Diagnosis not present

## 2020-04-28 DIAGNOSIS — Z8546 Personal history of malignant neoplasm of prostate: Secondary | ICD-10-CM | POA: Diagnosis not present

## 2020-04-28 DIAGNOSIS — I10 Essential (primary) hypertension: Secondary | ICD-10-CM | POA: Diagnosis not present

## 2020-04-28 DIAGNOSIS — Z5181 Encounter for therapeutic drug level monitoring: Secondary | ICD-10-CM | POA: Diagnosis not present

## 2020-04-28 DIAGNOSIS — Z9079 Acquired absence of other genital organ(s): Secondary | ICD-10-CM | POA: Diagnosis not present

## 2020-04-28 DIAGNOSIS — Z940 Kidney transplant status: Secondary | ICD-10-CM | POA: Diagnosis not present

## 2020-04-28 DIAGNOSIS — D649 Anemia, unspecified: Secondary | ICD-10-CM | POA: Diagnosis not present

## 2020-04-28 DIAGNOSIS — D72819 Decreased white blood cell count, unspecified: Secondary | ICD-10-CM | POA: Diagnosis not present

## 2020-04-28 DIAGNOSIS — K5909 Other constipation: Secondary | ICD-10-CM | POA: Diagnosis not present

## 2020-04-28 DIAGNOSIS — Z7952 Long term (current) use of systemic steroids: Secondary | ICD-10-CM | POA: Diagnosis not present

## 2020-04-28 DIAGNOSIS — Z23 Encounter for immunization: Secondary | ICD-10-CM | POA: Diagnosis not present

## 2020-05-26 DIAGNOSIS — Z4822 Encounter for aftercare following kidney transplant: Secondary | ICD-10-CM | POA: Diagnosis not present

## 2020-05-26 DIAGNOSIS — Z79899 Other long term (current) drug therapy: Secondary | ICD-10-CM | POA: Diagnosis not present

## 2020-06-30 DIAGNOSIS — N3942 Incontinence without sensory awareness: Secondary | ICD-10-CM | POA: Diagnosis not present

## 2020-06-30 DIAGNOSIS — R609 Edema, unspecified: Secondary | ICD-10-CM | POA: Diagnosis not present

## 2020-06-30 DIAGNOSIS — D72819 Decreased white blood cell count, unspecified: Secondary | ICD-10-CM | POA: Diagnosis not present

## 2020-06-30 DIAGNOSIS — K5909 Other constipation: Secondary | ICD-10-CM | POA: Diagnosis not present

## 2020-06-30 DIAGNOSIS — K5900 Constipation, unspecified: Secondary | ICD-10-CM | POA: Diagnosis not present

## 2020-06-30 DIAGNOSIS — Z79899 Other long term (current) drug therapy: Secondary | ICD-10-CM | POA: Diagnosis not present

## 2020-06-30 DIAGNOSIS — Z792 Long term (current) use of antibiotics: Secondary | ICD-10-CM | POA: Diagnosis not present

## 2020-06-30 DIAGNOSIS — D696 Thrombocytopenia, unspecified: Secondary | ICD-10-CM | POA: Diagnosis not present

## 2020-06-30 DIAGNOSIS — Z7952 Long term (current) use of systemic steroids: Secondary | ICD-10-CM | POA: Diagnosis not present

## 2020-06-30 DIAGNOSIS — I10 Essential (primary) hypertension: Secondary | ICD-10-CM | POA: Diagnosis not present

## 2020-06-30 DIAGNOSIS — D8989 Other specified disorders involving the immune mechanism, not elsewhere classified: Secondary | ICD-10-CM | POA: Diagnosis not present

## 2020-06-30 DIAGNOSIS — Z8546 Personal history of malignant neoplasm of prostate: Secondary | ICD-10-CM | POA: Diagnosis not present

## 2020-06-30 DIAGNOSIS — Z940 Kidney transplant status: Secondary | ICD-10-CM | POA: Diagnosis not present

## 2020-06-30 DIAGNOSIS — D649 Anemia, unspecified: Secondary | ICD-10-CM | POA: Diagnosis not present

## 2020-06-30 DIAGNOSIS — Z4822 Encounter for aftercare following kidney transplant: Secondary | ICD-10-CM | POA: Diagnosis not present

## 2020-07-28 DIAGNOSIS — Z79899 Other long term (current) drug therapy: Secondary | ICD-10-CM | POA: Diagnosis not present

## 2020-07-28 DIAGNOSIS — Z940 Kidney transplant status: Secondary | ICD-10-CM | POA: Diagnosis not present

## 2020-07-28 DIAGNOSIS — D649 Anemia, unspecified: Secondary | ICD-10-CM | POA: Diagnosis not present

## 2020-07-28 DIAGNOSIS — D849 Immunodeficiency, unspecified: Secondary | ICD-10-CM | POA: Diagnosis not present

## 2020-07-28 DIAGNOSIS — Z792 Long term (current) use of antibiotics: Secondary | ICD-10-CM | POA: Diagnosis not present

## 2020-07-28 DIAGNOSIS — N3942 Incontinence without sensory awareness: Secondary | ICD-10-CM | POA: Diagnosis not present

## 2020-07-28 DIAGNOSIS — Z4822 Encounter for aftercare following kidney transplant: Secondary | ICD-10-CM | POA: Diagnosis not present

## 2020-07-28 DIAGNOSIS — K5909 Other constipation: Secondary | ICD-10-CM | POA: Diagnosis not present

## 2020-07-28 DIAGNOSIS — E8770 Fluid overload, unspecified: Secondary | ICD-10-CM | POA: Diagnosis not present

## 2020-07-28 DIAGNOSIS — D696 Thrombocytopenia, unspecified: Secondary | ICD-10-CM | POA: Diagnosis not present

## 2020-07-28 DIAGNOSIS — D72819 Decreased white blood cell count, unspecified: Secondary | ICD-10-CM | POA: Diagnosis not present

## 2020-07-28 DIAGNOSIS — D61818 Other pancytopenia: Secondary | ICD-10-CM | POA: Diagnosis not present

## 2020-07-28 DIAGNOSIS — Z7952 Long term (current) use of systemic steroids: Secondary | ICD-10-CM | POA: Diagnosis not present

## 2020-07-28 DIAGNOSIS — C61 Malignant neoplasm of prostate: Secondary | ICD-10-CM | POA: Diagnosis not present

## 2020-07-28 DIAGNOSIS — I10 Essential (primary) hypertension: Secondary | ICD-10-CM | POA: Diagnosis not present

## 2020-07-28 DIAGNOSIS — Z9079 Acquired absence of other genital organ(s): Secondary | ICD-10-CM | POA: Diagnosis not present

## 2020-08-25 DIAGNOSIS — Z79899 Other long term (current) drug therapy: Secondary | ICD-10-CM | POA: Diagnosis not present

## 2020-08-25 DIAGNOSIS — Z4822 Encounter for aftercare following kidney transplant: Secondary | ICD-10-CM | POA: Diagnosis not present

## 2020-09-22 DIAGNOSIS — Z4822 Encounter for aftercare following kidney transplant: Secondary | ICD-10-CM | POA: Diagnosis not present

## 2020-09-22 DIAGNOSIS — Z79899 Other long term (current) drug therapy: Secondary | ICD-10-CM | POA: Diagnosis not present

## 2020-10-20 DIAGNOSIS — Z79899 Other long term (current) drug therapy: Secondary | ICD-10-CM | POA: Diagnosis not present

## 2020-10-20 DIAGNOSIS — Z4822 Encounter for aftercare following kidney transplant: Secondary | ICD-10-CM | POA: Diagnosis not present

## 2020-10-27 DIAGNOSIS — Z940 Kidney transplant status: Secondary | ICD-10-CM | POA: Diagnosis not present

## 2020-10-27 DIAGNOSIS — R7303 Prediabetes: Secondary | ICD-10-CM | POA: Diagnosis not present

## 2020-10-27 DIAGNOSIS — B027 Disseminated zoster: Secondary | ICD-10-CM | POA: Diagnosis not present

## 2020-10-27 DIAGNOSIS — D638 Anemia in other chronic diseases classified elsewhere: Secondary | ICD-10-CM | POA: Diagnosis not present

## 2020-10-27 DIAGNOSIS — I10 Essential (primary) hypertension: Secondary | ICD-10-CM | POA: Diagnosis not present

## 2020-10-27 DIAGNOSIS — I739 Peripheral vascular disease, unspecified: Secondary | ICD-10-CM | POA: Diagnosis not present

## 2020-10-27 DIAGNOSIS — N400 Enlarged prostate without lower urinary tract symptoms: Secondary | ICD-10-CM | POA: Diagnosis not present

## 2020-10-27 DIAGNOSIS — K581 Irritable bowel syndrome with constipation: Secondary | ICD-10-CM | POA: Diagnosis not present

## 2020-10-27 DIAGNOSIS — N529 Male erectile dysfunction, unspecified: Secondary | ICD-10-CM | POA: Diagnosis not present

## 2020-11-17 DIAGNOSIS — Z7952 Long term (current) use of systemic steroids: Secondary | ICD-10-CM | POA: Diagnosis not present

## 2020-11-17 DIAGNOSIS — I120 Hypertensive chronic kidney disease with stage 5 chronic kidney disease or end stage renal disease: Secondary | ICD-10-CM | POA: Diagnosis not present

## 2020-11-17 DIAGNOSIS — Z792 Long term (current) use of antibiotics: Secondary | ICD-10-CM | POA: Diagnosis not present

## 2020-11-17 DIAGNOSIS — Z79899 Other long term (current) drug therapy: Secondary | ICD-10-CM | POA: Diagnosis not present

## 2020-11-17 DIAGNOSIS — D849 Immunodeficiency, unspecified: Secondary | ICD-10-CM | POA: Diagnosis not present

## 2020-11-17 DIAGNOSIS — D649 Anemia, unspecified: Secondary | ICD-10-CM | POA: Diagnosis not present

## 2020-11-17 DIAGNOSIS — D72819 Decreased white blood cell count, unspecified: Secondary | ICD-10-CM | POA: Diagnosis not present

## 2020-11-17 DIAGNOSIS — C61 Malignant neoplasm of prostate: Secondary | ICD-10-CM | POA: Diagnosis not present

## 2020-11-17 DIAGNOSIS — Z4822 Encounter for aftercare following kidney transplant: Secondary | ICD-10-CM | POA: Diagnosis not present

## 2020-11-17 DIAGNOSIS — I10 Essential (primary) hypertension: Secondary | ICD-10-CM | POA: Diagnosis not present

## 2020-11-17 DIAGNOSIS — R32 Unspecified urinary incontinence: Secondary | ICD-10-CM | POA: Diagnosis not present

## 2020-11-17 DIAGNOSIS — B029 Zoster without complications: Secondary | ICD-10-CM | POA: Diagnosis not present

## 2020-11-17 DIAGNOSIS — Z9079 Acquired absence of other genital organ(s): Secondary | ICD-10-CM | POA: Diagnosis not present

## 2020-11-17 DIAGNOSIS — Z940 Kidney transplant status: Secondary | ICD-10-CM | POA: Diagnosis not present

## 2020-11-17 DIAGNOSIS — K5909 Other constipation: Secondary | ICD-10-CM | POA: Diagnosis not present

## 2020-11-17 DIAGNOSIS — N186 End stage renal disease: Secondary | ICD-10-CM | POA: Diagnosis not present

## 2020-12-01 DIAGNOSIS — D702 Other drug-induced agranulocytosis: Secondary | ICD-10-CM | POA: Diagnosis not present

## 2020-12-01 DIAGNOSIS — Z792 Long term (current) use of antibiotics: Secondary | ICD-10-CM | POA: Diagnosis not present

## 2020-12-01 DIAGNOSIS — Z4822 Encounter for aftercare following kidney transplant: Secondary | ICD-10-CM | POA: Diagnosis not present

## 2020-12-01 DIAGNOSIS — Z79899 Other long term (current) drug therapy: Secondary | ICD-10-CM | POA: Diagnosis not present

## 2020-12-01 DIAGNOSIS — Z87891 Personal history of nicotine dependence: Secondary | ICD-10-CM | POA: Diagnosis not present

## 2020-12-01 DIAGNOSIS — R32 Unspecified urinary incontinence: Secondary | ICD-10-CM | POA: Diagnosis not present

## 2020-12-01 DIAGNOSIS — Z9079 Acquired absence of other genital organ(s): Secondary | ICD-10-CM | POA: Diagnosis not present

## 2020-12-01 DIAGNOSIS — Z7952 Long term (current) use of systemic steroids: Secondary | ICD-10-CM | POA: Diagnosis not present

## 2020-12-01 DIAGNOSIS — I10 Essential (primary) hypertension: Secondary | ICD-10-CM | POA: Diagnosis not present

## 2020-12-01 DIAGNOSIS — K5909 Other constipation: Secondary | ICD-10-CM | POA: Diagnosis not present

## 2020-12-01 DIAGNOSIS — D649 Anemia, unspecified: Secondary | ICD-10-CM | POA: Diagnosis not present

## 2020-12-01 DIAGNOSIS — Z8546 Personal history of malignant neoplasm of prostate: Secondary | ICD-10-CM | POA: Diagnosis not present

## 2020-12-12 DIAGNOSIS — N400 Enlarged prostate without lower urinary tract symptoms: Secondary | ICD-10-CM | POA: Diagnosis not present

## 2020-12-12 DIAGNOSIS — D638 Anemia in other chronic diseases classified elsewhere: Secondary | ICD-10-CM | POA: Diagnosis not present

## 2020-12-12 DIAGNOSIS — N184 Chronic kidney disease, stage 4 (severe): Secondary | ICD-10-CM | POA: Diagnosis not present

## 2020-12-12 DIAGNOSIS — K581 Irritable bowel syndrome with constipation: Secondary | ICD-10-CM | POA: Diagnosis not present

## 2020-12-12 DIAGNOSIS — N529 Male erectile dysfunction, unspecified: Secondary | ICD-10-CM | POA: Diagnosis not present

## 2020-12-12 DIAGNOSIS — I10 Essential (primary) hypertension: Secondary | ICD-10-CM | POA: Diagnosis not present

## 2020-12-12 DIAGNOSIS — Z0001 Encounter for general adult medical examination with abnormal findings: Secondary | ICD-10-CM | POA: Diagnosis not present

## 2020-12-12 DIAGNOSIS — I739 Peripheral vascular disease, unspecified: Secondary | ICD-10-CM | POA: Diagnosis not present

## 2020-12-20 DIAGNOSIS — E559 Vitamin D deficiency, unspecified: Secondary | ICD-10-CM | POA: Diagnosis not present

## 2020-12-20 DIAGNOSIS — Z940 Kidney transplant status: Secondary | ICD-10-CM | POA: Diagnosis not present

## 2020-12-27 DIAGNOSIS — N2581 Secondary hyperparathyroidism of renal origin: Secondary | ICD-10-CM | POA: Diagnosis not present

## 2020-12-27 DIAGNOSIS — N1832 Chronic kidney disease, stage 3b: Secondary | ICD-10-CM | POA: Diagnosis not present

## 2020-12-27 DIAGNOSIS — Z940 Kidney transplant status: Secondary | ICD-10-CM | POA: Diagnosis not present

## 2020-12-27 DIAGNOSIS — D631 Anemia in chronic kidney disease: Secondary | ICD-10-CM | POA: Diagnosis not present

## 2020-12-29 DIAGNOSIS — Z23 Encounter for immunization: Secondary | ICD-10-CM | POA: Diagnosis not present

## 2020-12-29 DIAGNOSIS — Z79899 Other long term (current) drug therapy: Secondary | ICD-10-CM | POA: Diagnosis not present

## 2020-12-29 DIAGNOSIS — Z4822 Encounter for aftercare following kidney transplant: Secondary | ICD-10-CM | POA: Diagnosis not present

## 2021-01-26 DIAGNOSIS — Z4822 Encounter for aftercare following kidney transplant: Secondary | ICD-10-CM | POA: Diagnosis not present

## 2021-02-23 DIAGNOSIS — Z79899 Other long term (current) drug therapy: Secondary | ICD-10-CM | POA: Diagnosis not present

## 2021-02-23 DIAGNOSIS — Z4822 Encounter for aftercare following kidney transplant: Secondary | ICD-10-CM | POA: Diagnosis not present

## 2021-02-23 DIAGNOSIS — R7989 Other specified abnormal findings of blood chemistry: Secondary | ICD-10-CM | POA: Diagnosis not present

## 2021-02-23 DIAGNOSIS — Z940 Kidney transplant status: Secondary | ICD-10-CM | POA: Diagnosis not present

## 2021-03-23 DIAGNOSIS — Z79899 Other long term (current) drug therapy: Secondary | ICD-10-CM | POA: Diagnosis not present

## 2021-03-23 DIAGNOSIS — Z4822 Encounter for aftercare following kidney transplant: Secondary | ICD-10-CM | POA: Diagnosis not present

## 2021-04-03 DIAGNOSIS — D638 Anemia in other chronic diseases classified elsewhere: Secondary | ICD-10-CM | POA: Diagnosis not present

## 2021-04-03 DIAGNOSIS — R7303 Prediabetes: Secondary | ICD-10-CM | POA: Diagnosis not present

## 2021-04-03 DIAGNOSIS — N400 Enlarged prostate without lower urinary tract symptoms: Secondary | ICD-10-CM | POA: Diagnosis not present

## 2021-04-03 DIAGNOSIS — K581 Irritable bowel syndrome with constipation: Secondary | ICD-10-CM | POA: Diagnosis not present

## 2021-04-03 DIAGNOSIS — N529 Male erectile dysfunction, unspecified: Secondary | ICD-10-CM | POA: Diagnosis not present

## 2021-04-03 DIAGNOSIS — I10 Essential (primary) hypertension: Secondary | ICD-10-CM | POA: Diagnosis not present

## 2021-04-03 DIAGNOSIS — N184 Chronic kidney disease, stage 4 (severe): Secondary | ICD-10-CM | POA: Diagnosis not present

## 2021-04-03 DIAGNOSIS — I739 Peripheral vascular disease, unspecified: Secondary | ICD-10-CM | POA: Diagnosis not present

## 2021-04-20 DIAGNOSIS — Z5181 Encounter for therapeutic drug level monitoring: Secondary | ICD-10-CM | POA: Diagnosis not present

## 2021-04-20 DIAGNOSIS — I10 Essential (primary) hypertension: Secondary | ICD-10-CM | POA: Diagnosis not present

## 2021-04-20 DIAGNOSIS — Z87891 Personal history of nicotine dependence: Secondary | ICD-10-CM | POA: Diagnosis not present

## 2021-04-20 DIAGNOSIS — Z4822 Encounter for aftercare following kidney transplant: Secondary | ICD-10-CM | POA: Diagnosis not present

## 2021-04-20 DIAGNOSIS — D849 Immunodeficiency, unspecified: Secondary | ICD-10-CM | POA: Diagnosis not present

## 2021-04-20 DIAGNOSIS — Z940 Kidney transplant status: Secondary | ICD-10-CM | POA: Diagnosis not present

## 2021-04-20 DIAGNOSIS — Z7952 Long term (current) use of systemic steroids: Secondary | ICD-10-CM | POA: Diagnosis not present

## 2021-04-20 DIAGNOSIS — K5909 Other constipation: Secondary | ICD-10-CM | POA: Diagnosis not present

## 2021-04-20 DIAGNOSIS — Z79899 Other long term (current) drug therapy: Secondary | ICD-10-CM | POA: Diagnosis not present

## 2021-04-20 DIAGNOSIS — Z9079 Acquired absence of other genital organ(s): Secondary | ICD-10-CM | POA: Diagnosis not present

## 2021-04-20 DIAGNOSIS — C61 Malignant neoplasm of prostate: Secondary | ICD-10-CM | POA: Diagnosis not present

## 2021-04-20 DIAGNOSIS — N3942 Incontinence without sensory awareness: Secondary | ICD-10-CM | POA: Diagnosis not present

## 2021-04-20 DIAGNOSIS — Z7982 Long term (current) use of aspirin: Secondary | ICD-10-CM | POA: Diagnosis not present

## 2021-04-20 DIAGNOSIS — D649 Anemia, unspecified: Secondary | ICD-10-CM | POA: Diagnosis not present

## 2021-05-01 DIAGNOSIS — N2581 Secondary hyperparathyroidism of renal origin: Secondary | ICD-10-CM | POA: Diagnosis not present

## 2021-05-01 DIAGNOSIS — Z940 Kidney transplant status: Secondary | ICD-10-CM | POA: Diagnosis not present

## 2021-05-08 DIAGNOSIS — N1832 Chronic kidney disease, stage 3b: Secondary | ICD-10-CM | POA: Diagnosis not present

## 2021-05-08 DIAGNOSIS — N2581 Secondary hyperparathyroidism of renal origin: Secondary | ICD-10-CM | POA: Diagnosis not present

## 2021-05-08 DIAGNOSIS — D631 Anemia in chronic kidney disease: Secondary | ICD-10-CM | POA: Diagnosis not present

## 2021-05-08 DIAGNOSIS — Z940 Kidney transplant status: Secondary | ICD-10-CM | POA: Diagnosis not present

## 2021-05-08 DIAGNOSIS — I129 Hypertensive chronic kidney disease with stage 1 through stage 4 chronic kidney disease, or unspecified chronic kidney disease: Secondary | ICD-10-CM | POA: Diagnosis not present

## 2021-05-18 DIAGNOSIS — Z4822 Encounter for aftercare following kidney transplant: Secondary | ICD-10-CM | POA: Diagnosis not present

## 2021-05-18 DIAGNOSIS — Z79899 Other long term (current) drug therapy: Secondary | ICD-10-CM | POA: Diagnosis not present

## 2021-06-14 DIAGNOSIS — Z4822 Encounter for aftercare following kidney transplant: Secondary | ICD-10-CM | POA: Diagnosis not present

## 2021-06-14 DIAGNOSIS — Z79899 Other long term (current) drug therapy: Secondary | ICD-10-CM | POA: Diagnosis not present

## 2021-07-13 DIAGNOSIS — Z79899 Other long term (current) drug therapy: Secondary | ICD-10-CM | POA: Diagnosis not present

## 2021-07-13 DIAGNOSIS — Z4822 Encounter for aftercare following kidney transplant: Secondary | ICD-10-CM | POA: Diagnosis not present

## 2021-08-10 DIAGNOSIS — I10 Essential (primary) hypertension: Secondary | ICD-10-CM | POA: Diagnosis not present

## 2021-08-10 DIAGNOSIS — D849 Immunodeficiency, unspecified: Secondary | ICD-10-CM | POA: Diagnosis not present

## 2021-08-10 DIAGNOSIS — Z940 Kidney transplant status: Secondary | ICD-10-CM | POA: Diagnosis not present

## 2021-08-10 DIAGNOSIS — Z79899 Other long term (current) drug therapy: Secondary | ICD-10-CM | POA: Diagnosis not present

## 2021-08-30 DIAGNOSIS — Z940 Kidney transplant status: Secondary | ICD-10-CM | POA: Diagnosis not present

## 2021-08-30 DIAGNOSIS — N2581 Secondary hyperparathyroidism of renal origin: Secondary | ICD-10-CM | POA: Diagnosis not present

## 2021-10-02 DIAGNOSIS — N529 Male erectile dysfunction, unspecified: Secondary | ICD-10-CM | POA: Diagnosis not present

## 2021-10-02 DIAGNOSIS — D638 Anemia in other chronic diseases classified elsewhere: Secondary | ICD-10-CM | POA: Diagnosis not present

## 2021-10-02 DIAGNOSIS — I10 Essential (primary) hypertension: Secondary | ICD-10-CM | POA: Diagnosis not present

## 2021-10-02 DIAGNOSIS — R7303 Prediabetes: Secondary | ICD-10-CM | POA: Diagnosis not present

## 2021-10-02 DIAGNOSIS — N400 Enlarged prostate without lower urinary tract symptoms: Secondary | ICD-10-CM | POA: Diagnosis not present

## 2021-10-02 DIAGNOSIS — N184 Chronic kidney disease, stage 4 (severe): Secondary | ICD-10-CM | POA: Diagnosis not present

## 2021-10-02 DIAGNOSIS — I739 Peripheral vascular disease, unspecified: Secondary | ICD-10-CM | POA: Diagnosis not present

## 2021-10-02 DIAGNOSIS — K581 Irritable bowel syndrome with constipation: Secondary | ICD-10-CM | POA: Diagnosis not present

## 2021-10-05 DIAGNOSIS — Z7969 Long term (current) use of other immunomodulators and immunosuppressants: Secondary | ICD-10-CM | POA: Diagnosis not present

## 2021-10-05 DIAGNOSIS — Z4822 Encounter for aftercare following kidney transplant: Secondary | ICD-10-CM | POA: Diagnosis not present

## 2021-11-02 DIAGNOSIS — Z4822 Encounter for aftercare following kidney transplant: Secondary | ICD-10-CM | POA: Diagnosis not present

## 2021-11-02 DIAGNOSIS — Z7969 Long term (current) use of other immunomodulators and immunosuppressants: Secondary | ICD-10-CM | POA: Diagnosis not present

## 2021-11-30 DIAGNOSIS — Z4822 Encounter for aftercare following kidney transplant: Secondary | ICD-10-CM | POA: Diagnosis not present

## 2021-12-15 DIAGNOSIS — Z940 Kidney transplant status: Secondary | ICD-10-CM | POA: Diagnosis not present

## 2021-12-15 DIAGNOSIS — E559 Vitamin D deficiency, unspecified: Secondary | ICD-10-CM | POA: Diagnosis not present

## 2021-12-21 DIAGNOSIS — Z940 Kidney transplant status: Secondary | ICD-10-CM | POA: Diagnosis not present

## 2021-12-21 DIAGNOSIS — D631 Anemia in chronic kidney disease: Secondary | ICD-10-CM | POA: Diagnosis not present

## 2021-12-21 DIAGNOSIS — N2581 Secondary hyperparathyroidism of renal origin: Secondary | ICD-10-CM | POA: Diagnosis not present

## 2021-12-21 DIAGNOSIS — I129 Hypertensive chronic kidney disease with stage 1 through stage 4 chronic kidney disease, or unspecified chronic kidney disease: Secondary | ICD-10-CM | POA: Diagnosis not present

## 2021-12-21 DIAGNOSIS — N1832 Chronic kidney disease, stage 3b: Secondary | ICD-10-CM | POA: Diagnosis not present

## 2021-12-28 DIAGNOSIS — Z792 Long term (current) use of antibiotics: Secondary | ICD-10-CM | POA: Diagnosis not present

## 2021-12-28 DIAGNOSIS — I10 Essential (primary) hypertension: Secondary | ICD-10-CM | POA: Diagnosis not present

## 2021-12-28 DIAGNOSIS — C61 Malignant neoplasm of prostate: Secondary | ICD-10-CM | POA: Diagnosis not present

## 2021-12-28 DIAGNOSIS — N39498 Other specified urinary incontinence: Secondary | ICD-10-CM | POA: Diagnosis not present

## 2021-12-28 DIAGNOSIS — Z7952 Long term (current) use of systemic steroids: Secondary | ICD-10-CM | POA: Diagnosis not present

## 2021-12-28 DIAGNOSIS — Z4822 Encounter for aftercare following kidney transplant: Secondary | ICD-10-CM | POA: Diagnosis not present

## 2021-12-28 DIAGNOSIS — D708 Other neutropenia: Secondary | ICD-10-CM | POA: Diagnosis not present

## 2021-12-28 DIAGNOSIS — D649 Anemia, unspecified: Secondary | ICD-10-CM | POA: Diagnosis not present

## 2021-12-28 DIAGNOSIS — K5909 Other constipation: Secondary | ICD-10-CM | POA: Diagnosis not present

## 2021-12-28 DIAGNOSIS — D61818 Other pancytopenia: Secondary | ICD-10-CM | POA: Diagnosis not present

## 2021-12-28 DIAGNOSIS — D849 Immunodeficiency, unspecified: Secondary | ICD-10-CM | POA: Diagnosis not present

## 2021-12-28 DIAGNOSIS — Z7969 Long term (current) use of other immunomodulators and immunosuppressants: Secondary | ICD-10-CM | POA: Diagnosis not present

## 2021-12-28 DIAGNOSIS — Z9079 Acquired absence of other genital organ(s): Secondary | ICD-10-CM | POA: Diagnosis not present

## 2021-12-28 DIAGNOSIS — Z79899 Other long term (current) drug therapy: Secondary | ICD-10-CM | POA: Diagnosis not present

## 2021-12-28 DIAGNOSIS — Z940 Kidney transplant status: Secondary | ICD-10-CM | POA: Diagnosis not present

## 2022-01-25 DIAGNOSIS — D849 Immunodeficiency, unspecified: Secondary | ICD-10-CM | POA: Diagnosis not present

## 2022-01-25 DIAGNOSIS — Z4822 Encounter for aftercare following kidney transplant: Secondary | ICD-10-CM | POA: Diagnosis not present

## 2022-01-25 DIAGNOSIS — Z7969 Long term (current) use of other immunomodulators and immunosuppressants: Secondary | ICD-10-CM | POA: Diagnosis not present

## 2022-02-22 DIAGNOSIS — Z4822 Encounter for aftercare following kidney transplant: Secondary | ICD-10-CM | POA: Diagnosis not present

## 2022-03-22 DIAGNOSIS — D849 Immunodeficiency, unspecified: Secondary | ICD-10-CM | POA: Diagnosis not present

## 2022-03-22 DIAGNOSIS — Z7969 Long term (current) use of other immunomodulators and immunosuppressants: Secondary | ICD-10-CM | POA: Diagnosis not present

## 2022-03-22 DIAGNOSIS — Z4822 Encounter for aftercare following kidney transplant: Secondary | ICD-10-CM | POA: Diagnosis not present

## 2022-04-02 DIAGNOSIS — K581 Irritable bowel syndrome with constipation: Secondary | ICD-10-CM | POA: Diagnosis not present

## 2022-04-02 DIAGNOSIS — N400 Enlarged prostate without lower urinary tract symptoms: Secondary | ICD-10-CM | POA: Diagnosis not present

## 2022-04-02 DIAGNOSIS — I10 Essential (primary) hypertension: Secondary | ICD-10-CM | POA: Diagnosis not present

## 2022-04-02 DIAGNOSIS — N529 Male erectile dysfunction, unspecified: Secondary | ICD-10-CM | POA: Diagnosis not present

## 2022-04-02 DIAGNOSIS — I739 Peripheral vascular disease, unspecified: Secondary | ICD-10-CM | POA: Diagnosis not present

## 2022-04-02 DIAGNOSIS — D638 Anemia in other chronic diseases classified elsewhere: Secondary | ICD-10-CM | POA: Diagnosis not present

## 2022-04-02 DIAGNOSIS — Z23 Encounter for immunization: Secondary | ICD-10-CM | POA: Diagnosis not present

## 2022-04-02 DIAGNOSIS — N184 Chronic kidney disease, stage 4 (severe): Secondary | ICD-10-CM | POA: Diagnosis not present

## 2022-04-02 DIAGNOSIS — R7303 Prediabetes: Secondary | ICD-10-CM | POA: Diagnosis not present

## 2022-04-06 DIAGNOSIS — D638 Anemia in other chronic diseases classified elsewhere: Secondary | ICD-10-CM | POA: Diagnosis not present

## 2022-04-06 DIAGNOSIS — N529 Male erectile dysfunction, unspecified: Secondary | ICD-10-CM | POA: Diagnosis not present

## 2022-04-06 DIAGNOSIS — N400 Enlarged prostate without lower urinary tract symptoms: Secondary | ICD-10-CM | POA: Diagnosis not present

## 2022-04-06 DIAGNOSIS — N184 Chronic kidney disease, stage 4 (severe): Secondary | ICD-10-CM | POA: Diagnosis not present

## 2022-04-06 DIAGNOSIS — E119 Type 2 diabetes mellitus without complications: Secondary | ICD-10-CM | POA: Diagnosis not present

## 2022-04-06 DIAGNOSIS — R7303 Prediabetes: Secondary | ICD-10-CM | POA: Diagnosis not present

## 2022-04-06 DIAGNOSIS — I739 Peripheral vascular disease, unspecified: Secondary | ICD-10-CM | POA: Diagnosis not present

## 2022-04-06 DIAGNOSIS — K581 Irritable bowel syndrome with constipation: Secondary | ICD-10-CM | POA: Diagnosis not present

## 2022-04-06 DIAGNOSIS — I10 Essential (primary) hypertension: Secondary | ICD-10-CM | POA: Diagnosis not present

## 2022-04-19 DIAGNOSIS — Z79899 Other long term (current) drug therapy: Secondary | ICD-10-CM | POA: Diagnosis not present

## 2022-04-19 DIAGNOSIS — D849 Immunodeficiency, unspecified: Secondary | ICD-10-CM | POA: Diagnosis not present

## 2022-04-19 DIAGNOSIS — Z4822 Encounter for aftercare following kidney transplant: Secondary | ICD-10-CM | POA: Diagnosis not present

## 2022-04-23 DIAGNOSIS — Z940 Kidney transplant status: Secondary | ICD-10-CM | POA: Diagnosis not present

## 2022-04-23 DIAGNOSIS — E559 Vitamin D deficiency, unspecified: Secondary | ICD-10-CM | POA: Diagnosis not present

## 2022-04-26 ENCOUNTER — Encounter: Payer: Medicare Other | Attending: Internal Medicine | Admitting: Skilled Nursing Facility1

## 2022-04-26 ENCOUNTER — Ambulatory Visit (INDEPENDENT_AMBULATORY_CARE_PROVIDER_SITE_OTHER): Payer: Medicare Other | Admitting: Podiatry

## 2022-04-26 DIAGNOSIS — M79674 Pain in right toe(s): Secondary | ICD-10-CM | POA: Diagnosis not present

## 2022-04-26 DIAGNOSIS — E119 Type 2 diabetes mellitus without complications: Secondary | ICD-10-CM | POA: Insufficient documentation

## 2022-04-26 DIAGNOSIS — M79675 Pain in left toe(s): Secondary | ICD-10-CM | POA: Diagnosis not present

## 2022-04-26 DIAGNOSIS — B351 Tinea unguium: Secondary | ICD-10-CM | POA: Diagnosis not present

## 2022-04-26 DIAGNOSIS — E1142 Type 2 diabetes mellitus with diabetic polyneuropathy: Secondary | ICD-10-CM

## 2022-04-26 NOTE — Progress Notes (Signed)
  Subjective:  Patient ID: Andrew Brandt, male    DOB: [birth year],  MRN: [id number]  Chief Complaint  Patient presents with   diabetic foot care    Room 54  [facility]    71 y.o. male presents with the above complaint. History confirmed with patient.  Patient presents the office today for evaluation for thickened elongated painful nails that are discolored and growing abnormally x5 on both feet.  He does have history of diabetes type 2.  Does have peripheral neuropathy.  He is unable to trim the nails himself due to thickness and mobility problems.  Presenting for evaluation for diabetes and diabetic nail care.  Objective:  Physical Exam: warm, good capillary refill, nail exam onychomycosis of the toenails x5 bilateral with pain, onycholysis, and dystrophic nails, no trophic changes or ulcerative lesions.  DP and PT pulses weakly palpable bilaterally edema is present bilaterally protective sensation intact Left Foot: No areas of hyperkeratotic  callus or open wound Right Foot: No areas of hyperkeratotic callus or open wound  No images are attached to the encounter.  Assessment:  No diagnosis found.   Plan:  Patient was evaluated and treated and all questions answered.  Patient educated on diabetes. Discussed proper diabetic foot care and discussed risks and complications of disease. Educated patient in depth on reasons to return to the office immediately should he/she discover anything concerning or new on the feet. All questions answered. Discussed proper shoes as well.   Onychomycosis with pain  -Nails palliatively debrided as below. -Educated on self-care  Procedure: Nail Debridement Rationale: Pain Type of Debridement: manual, sharp debridement. Instrumentation: Nail nipper, rotary burr. Number of Nails: 10  No follow-ups on file.         Everitt Amber, DPM [facility]

## 2022-04-30 ENCOUNTER — Encounter: Payer: Self-pay | Admitting: Skilled Nursing Facility1

## 2022-04-30 NOTE — Progress Notes (Signed)
Patient was seen on 04/26/2022 for the first of a series of three diabetes self-management courses at [facility].  Pt arrives with his very supportive wife.   Patient Education Plan per assessed needs and concerns is to attend three course education program for Diabetes Self Management Education.  A1C was 7.0  DM: Metformin 571m 2 times a day  The following learning objectives were met by the patient during this class: Describe diabetes, types of diabetes and pathophysiology State some common risk factors for diabetes Defines the role of glucose and insulin Describe the relationship between diabetes and cardiovascular and other risks State the members of the Healthcare Team States the rationale for glucose monitoring and when to test State their individual [place] importance of logging glucose readings and how to interpret the readings Identifies A1C target Explain the correlation between A1c and eAG values State symptoms and treatment of high blood glucose and low blood glucose Explain proper technique for glucose testing and identify proper sharps disposal  Handouts given during class include: How to Thrive:  A Guide for Your Journey with Diabetes by the ADA Meal Plan Card and carbohydrate content list Dietary intake form Low Sodium Flavoring Tips Types of Fats Dining Out Label reading Snack list The diabetes portion plate Diabetes Resources A1c to eAG Conversion Chart Blood Glucose Log Diabetes Recommended Care Schedule Support Group Diabetes Success Plan Core Class Satisfaction Survey   Follow-Up Plan: Attend core 2

## 2022-05-02 DIAGNOSIS — Z940 Kidney transplant status: Secondary | ICD-10-CM | POA: Diagnosis not present

## 2022-05-02 DIAGNOSIS — N1832 Chronic kidney disease, stage 3b: Secondary | ICD-10-CM | POA: Diagnosis not present

## 2022-05-02 DIAGNOSIS — D631 Anemia in chronic kidney disease: Secondary | ICD-10-CM | POA: Diagnosis not present

## 2022-05-02 DIAGNOSIS — I129 Hypertensive chronic kidney disease with stage 1 through stage 4 chronic kidney disease, or unspecified chronic kidney disease: Secondary | ICD-10-CM | POA: Diagnosis not present

## 2022-05-02 DIAGNOSIS — N2581 Secondary hyperparathyroidism of renal origin: Secondary | ICD-10-CM | POA: Diagnosis not present

## 2022-05-03 ENCOUNTER — Encounter: Payer: Self-pay | Admitting: Skilled Nursing Facility1

## 2022-05-03 ENCOUNTER — Encounter: Payer: Medicare Other | Attending: Physician Assistant | Admitting: Skilled Nursing Facility1

## 2022-05-03 DIAGNOSIS — E119 Type 2 diabetes mellitus without complications: Secondary | ICD-10-CM | POA: Diagnosis not present

## 2022-05-03 DIAGNOSIS — N186 End stage renal disease: Secondary | ICD-10-CM | POA: Diagnosis not present

## 2022-05-03 DIAGNOSIS — H25813 Combined forms of age-related cataract, bilateral: Secondary | ICD-10-CM | POA: Diagnosis not present

## 2022-05-03 NOTE — Progress Notes (Signed)

## 2022-05-07 DIAGNOSIS — I739 Peripheral vascular disease, unspecified: Secondary | ICD-10-CM | POA: Diagnosis not present

## 2022-05-07 DIAGNOSIS — D638 Anemia in other chronic diseases classified elsewhere: Secondary | ICD-10-CM | POA: Diagnosis not present

## 2022-05-07 DIAGNOSIS — K581 Irritable bowel syndrome with constipation: Secondary | ICD-10-CM | POA: Diagnosis not present

## 2022-05-07 DIAGNOSIS — I10 Essential (primary) hypertension: Secondary | ICD-10-CM | POA: Diagnosis not present

## 2022-05-07 DIAGNOSIS — N184 Chronic kidney disease, stage 4 (severe): Secondary | ICD-10-CM | POA: Diagnosis not present

## 2022-05-07 DIAGNOSIS — N400 Enlarged prostate without lower urinary tract symptoms: Secondary | ICD-10-CM | POA: Diagnosis not present

## 2022-05-07 DIAGNOSIS — Z0001 Encounter for general adult medical examination with abnormal findings: Secondary | ICD-10-CM | POA: Diagnosis not present

## 2022-05-07 DIAGNOSIS — E119 Type 2 diabetes mellitus without complications: Secondary | ICD-10-CM | POA: Diagnosis not present

## 2022-05-07 DIAGNOSIS — M1990 Unspecified osteoarthritis, unspecified site: Secondary | ICD-10-CM | POA: Diagnosis not present

## 2022-05-07 DIAGNOSIS — R7303 Prediabetes: Secondary | ICD-10-CM | POA: Diagnosis not present

## 2022-05-07 DIAGNOSIS — N529 Male erectile dysfunction, unspecified: Secondary | ICD-10-CM | POA: Diagnosis not present

## 2022-05-10 ENCOUNTER — Encounter: Payer: Medicare Other | Attending: Physician Assistant | Admitting: Dietician

## 2022-05-10 ENCOUNTER — Encounter: Payer: Self-pay | Admitting: Dietician

## 2022-05-10 DIAGNOSIS — E119 Type 2 diabetes mellitus without complications: Secondary | ICD-10-CM | POA: Diagnosis not present

## 2022-05-10 DIAGNOSIS — Z713 Dietary counseling and surveillance: Secondary | ICD-10-CM | POA: Diagnosis not present

## 2022-05-10 NOTE — Progress Notes (Signed)
Patient was seen on 05/10/22 for the third of a series of three diabetes self-management courses at the [facility].   State the amount of activity recommended for healthy living Describe activities suitable for individual needs Identify ways to regularly incorporate activity into daily life Identify barriers to activity and ways to over come these barriers Identify diabetes medications being personally used and their primary action for lowering glucose and possible side effects Describe role of stress on blood glucose and develop strategies to address psychosocial issues Identify diabetes complications and ways to prevent them Explain how to manage diabetes during illness Evaluate success in meeting personal goal Establish 2-3 goals that they will plan to diligently work on  Goals:  I will count my carb choices at most meals and snacks I will be active 10 minutes or more 5 times a week I will take my diabetes medications as scheduled I will test my glucose at least 3 times a day, 7 days a week  Your patient has identified these potential barriers to change:  Motivation Finances Stress Lack of Family Support  Your patient has identified their diabetes self-care support plan as  Family Forensic scientist Resources [facility] Support Group  American Diabetes Association Website    Plan:  Attend Support Group as desired

## 2022-05-17 DIAGNOSIS — Z7984 Long term (current) use of oral hypoglycemic drugs: Secondary | ICD-10-CM | POA: Diagnosis not present

## 2022-05-17 DIAGNOSIS — Z792 Long term (current) use of antibiotics: Secondary | ICD-10-CM | POA: Diagnosis not present

## 2022-05-17 DIAGNOSIS — E119 Type 2 diabetes mellitus without complications: Secondary | ICD-10-CM | POA: Diagnosis not present

## 2022-05-17 DIAGNOSIS — Z79899 Other long term (current) drug therapy: Secondary | ICD-10-CM | POA: Diagnosis not present

## 2022-05-17 DIAGNOSIS — D61818 Other pancytopenia: Secondary | ICD-10-CM | POA: Diagnosis not present

## 2022-05-17 DIAGNOSIS — Z7952 Long term (current) use of systemic steroids: Secondary | ICD-10-CM | POA: Diagnosis not present

## 2022-05-17 DIAGNOSIS — Z4822 Encounter for aftercare following kidney transplant: Secondary | ICD-10-CM | POA: Diagnosis not present

## 2022-05-17 DIAGNOSIS — I10 Essential (primary) hypertension: Secondary | ICD-10-CM | POA: Diagnosis not present

## 2022-05-17 DIAGNOSIS — Z8546 Personal history of malignant neoplasm of prostate: Secondary | ICD-10-CM | POA: Diagnosis not present

## 2022-05-17 DIAGNOSIS — K5909 Other constipation: Secondary | ICD-10-CM | POA: Diagnosis not present

## 2022-06-13 DIAGNOSIS — Z79899 Other long term (current) drug therapy: Secondary | ICD-10-CM | POA: Diagnosis not present

## 2022-06-13 DIAGNOSIS — Z4822 Encounter for aftercare following kidney transplant: Secondary | ICD-10-CM | POA: Diagnosis not present

## 2022-06-21 ENCOUNTER — Ambulatory Visit: Payer: Medicare Other

## 2022-06-28 ENCOUNTER — Ambulatory Visit: Payer: Medicare Other

## 2022-07-05 ENCOUNTER — Ambulatory Visit: Payer: Medicare Other

## 2022-07-12 DIAGNOSIS — Z79899 Other long term (current) drug therapy: Secondary | ICD-10-CM | POA: Diagnosis not present

## 2022-07-12 DIAGNOSIS — Z4822 Encounter for aftercare following kidney transplant: Secondary | ICD-10-CM | POA: Diagnosis not present

## 2022-07-26 ENCOUNTER — Ambulatory Visit: Payer: Medicare Other | Admitting: Podiatry

## 2022-08-09 DIAGNOSIS — Z79899 Other long term (current) drug therapy: Secondary | ICD-10-CM | POA: Diagnosis not present

## 2022-08-09 DIAGNOSIS — Z4822 Encounter for aftercare following kidney transplant: Secondary | ICD-10-CM | POA: Diagnosis not present

## 2022-09-04 ENCOUNTER — Other Ambulatory Visit: Payer: Self-pay

## 2022-09-04 ENCOUNTER — Inpatient Hospital Stay (HOSPITAL_COMMUNITY)
Admission: EM | Admit: 2022-09-04 | Discharge: 2022-09-21 | DRG: 177 | Disposition: E | Payer: Medicare Other | Attending: Internal Medicine | Admitting: Internal Medicine

## 2022-09-04 ENCOUNTER — Emergency Department (HOSPITAL_COMMUNITY): Payer: Medicare Other

## 2022-09-04 DIAGNOSIS — I82451 Acute embolism and thrombosis of right peroneal vein: Secondary | ICD-10-CM | POA: Diagnosis present

## 2022-09-04 DIAGNOSIS — R0902 Hypoxemia: Secondary | ICD-10-CM | POA: Diagnosis not present

## 2022-09-04 DIAGNOSIS — N186 End stage renal disease: Secondary | ICD-10-CM | POA: Diagnosis present

## 2022-09-04 DIAGNOSIS — D631 Anemia in chronic kidney disease: Secondary | ICD-10-CM | POA: Diagnosis present

## 2022-09-04 DIAGNOSIS — R824 Acetonuria: Secondary | ICD-10-CM | POA: Diagnosis present

## 2022-09-04 DIAGNOSIS — I469 Cardiac arrest, cause unspecified: Secondary | ICD-10-CM | POA: Diagnosis not present

## 2022-09-04 DIAGNOSIS — K828 Other specified diseases of gallbladder: Secondary | ICD-10-CM | POA: Diagnosis not present

## 2022-09-04 DIAGNOSIS — D696 Thrombocytopenia, unspecified: Secondary | ICD-10-CM | POA: Diagnosis not present

## 2022-09-04 DIAGNOSIS — E1122 Type 2 diabetes mellitus with diabetic chronic kidney disease: Secondary | ICD-10-CM | POA: Diagnosis present

## 2022-09-04 DIAGNOSIS — G9340 Encephalopathy, unspecified: Secondary | ICD-10-CM | POA: Diagnosis not present

## 2022-09-04 DIAGNOSIS — L80 Vitiligo: Secondary | ICD-10-CM | POA: Diagnosis present

## 2022-09-04 DIAGNOSIS — I120 Hypertensive chronic kidney disease with stage 5 chronic kidney disease or end stage renal disease: Secondary | ICD-10-CM | POA: Diagnosis not present

## 2022-09-04 DIAGNOSIS — N184 Chronic kidney disease, stage 4 (severe): Secondary | ICD-10-CM | POA: Diagnosis not present

## 2022-09-04 DIAGNOSIS — R42 Dizziness and giddiness: Secondary | ICD-10-CM | POA: Diagnosis not present

## 2022-09-04 DIAGNOSIS — D490 Neoplasm of unspecified behavior of digestive system: Secondary | ICD-10-CM | POA: Diagnosis present

## 2022-09-04 DIAGNOSIS — D62 Acute posthemorrhagic anemia: Secondary | ICD-10-CM | POA: Diagnosis not present

## 2022-09-04 DIAGNOSIS — J1282 Pneumonia due to coronavirus disease 2019: Secondary | ICD-10-CM | POA: Diagnosis not present

## 2022-09-04 DIAGNOSIS — Z515 Encounter for palliative care: Secondary | ICD-10-CM | POA: Diagnosis not present

## 2022-09-04 DIAGNOSIS — D509 Iron deficiency anemia, unspecified: Secondary | ICD-10-CM | POA: Diagnosis not present

## 2022-09-04 DIAGNOSIS — G928 Other toxic encephalopathy: Secondary | ICD-10-CM | POA: Diagnosis present

## 2022-09-04 DIAGNOSIS — R609 Edema, unspecified: Secondary | ICD-10-CM | POA: Diagnosis not present

## 2022-09-04 DIAGNOSIS — D84821 Immunodeficiency due to drugs: Secondary | ICD-10-CM | POA: Diagnosis present

## 2022-09-04 DIAGNOSIS — R0602 Shortness of breath: Secondary | ICD-10-CM | POA: Diagnosis not present

## 2022-09-04 DIAGNOSIS — T8619 Other complication of kidney transplant: Secondary | ICD-10-CM | POA: Diagnosis present

## 2022-09-04 DIAGNOSIS — R471 Dysarthria and anarthria: Secondary | ICD-10-CM | POA: Diagnosis present

## 2022-09-04 DIAGNOSIS — I82431 Acute embolism and thrombosis of right popliteal vein: Secondary | ICD-10-CM | POA: Diagnosis present

## 2022-09-04 DIAGNOSIS — Z992 Dependence on renal dialysis: Secondary | ICD-10-CM | POA: Diagnosis not present

## 2022-09-04 DIAGNOSIS — Q438 Other specified congenital malformations of intestine: Secondary | ICD-10-CM | POA: Diagnosis not present

## 2022-09-04 DIAGNOSIS — Z809 Family history of malignant neoplasm, unspecified: Secondary | ICD-10-CM

## 2022-09-04 DIAGNOSIS — K921 Melena: Secondary | ICD-10-CM | POA: Diagnosis not present

## 2022-09-04 DIAGNOSIS — Z79899 Other long term (current) drug therapy: Secondary | ICD-10-CM

## 2022-09-04 DIAGNOSIS — N179 Acute kidney failure, unspecified: Secondary | ICD-10-CM | POA: Diagnosis present

## 2022-09-04 DIAGNOSIS — Z86010 Personal history of colonic polyps: Secondary | ICD-10-CM

## 2022-09-04 DIAGNOSIS — Z87891 Personal history of nicotine dependence: Secondary | ICD-10-CM

## 2022-09-04 DIAGNOSIS — J9601 Acute respiratory failure with hypoxia: Secondary | ICD-10-CM | POA: Diagnosis not present

## 2022-09-04 DIAGNOSIS — R54 Age-related physical debility: Secondary | ICD-10-CM | POA: Diagnosis present

## 2022-09-04 DIAGNOSIS — K5731 Diverticulosis of large intestine without perforation or abscess with bleeding: Secondary | ICD-10-CM | POA: Diagnosis not present

## 2022-09-04 DIAGNOSIS — Z833 Family history of diabetes mellitus: Secondary | ICD-10-CM

## 2022-09-04 DIAGNOSIS — Z7189 Other specified counseling: Secondary | ICD-10-CM | POA: Diagnosis not present

## 2022-09-04 DIAGNOSIS — E860 Dehydration: Secondary | ICD-10-CM | POA: Diagnosis not present

## 2022-09-04 DIAGNOSIS — I4819 Other persistent atrial fibrillation: Secondary | ICD-10-CM | POA: Diagnosis present

## 2022-09-04 DIAGNOSIS — E876 Hypokalemia: Secondary | ICD-10-CM | POA: Diagnosis present

## 2022-09-04 DIAGNOSIS — Z9079 Acquired absence of other genital organ(s): Secondary | ICD-10-CM

## 2022-09-04 DIAGNOSIS — M549 Dorsalgia, unspecified: Secondary | ICD-10-CM | POA: Diagnosis present

## 2022-09-04 DIAGNOSIS — Z789 Other specified health status: Secondary | ICD-10-CM | POA: Diagnosis not present

## 2022-09-04 DIAGNOSIS — E871 Hypo-osmolality and hyponatremia: Secondary | ICD-10-CM | POA: Diagnosis present

## 2022-09-04 DIAGNOSIS — K5733 Diverticulitis of large intestine without perforation or abscess with bleeding: Secondary | ICD-10-CM | POA: Diagnosis not present

## 2022-09-04 DIAGNOSIS — I82401 Acute embolism and thrombosis of unspecified deep veins of right lower extremity: Secondary | ICD-10-CM | POA: Diagnosis not present

## 2022-09-04 DIAGNOSIS — D849 Immunodeficiency, unspecified: Secondary | ICD-10-CM | POA: Diagnosis not present

## 2022-09-04 DIAGNOSIS — U071 COVID-19: Principal | ICD-10-CM | POA: Diagnosis present

## 2022-09-04 DIAGNOSIS — Z940 Kidney transplant status: Secondary | ICD-10-CM | POA: Diagnosis not present

## 2022-09-04 DIAGNOSIS — N1330 Unspecified hydronephrosis: Secondary | ICD-10-CM | POA: Diagnosis not present

## 2022-09-04 DIAGNOSIS — K5939 Other megacolon: Secondary | ICD-10-CM | POA: Diagnosis not present

## 2022-09-04 DIAGNOSIS — K625 Hemorrhage of anus and rectum: Secondary | ICD-10-CM | POA: Diagnosis not present

## 2022-09-04 DIAGNOSIS — R531 Weakness: Secondary | ICD-10-CM | POA: Diagnosis not present

## 2022-09-04 DIAGNOSIS — I4891 Unspecified atrial fibrillation: Secondary | ICD-10-CM | POA: Diagnosis not present

## 2022-09-04 DIAGNOSIS — I82411 Acute embolism and thrombosis of right femoral vein: Secondary | ICD-10-CM | POA: Diagnosis not present

## 2022-09-04 DIAGNOSIS — Z8546 Personal history of malignant neoplasm of prostate: Secondary | ICD-10-CM

## 2022-09-04 DIAGNOSIS — K5730 Diverticulosis of large intestine without perforation or abscess without bleeding: Secondary | ICD-10-CM | POA: Diagnosis not present

## 2022-09-04 DIAGNOSIS — R059 Cough, unspecified: Secondary | ICD-10-CM | POA: Diagnosis not present

## 2022-09-04 DIAGNOSIS — R197 Diarrhea, unspecified: Secondary | ICD-10-CM | POA: Diagnosis not present

## 2022-09-04 DIAGNOSIS — Y830 Surgical operation with transplant of whole organ as the cause of abnormal reaction of the patient, or of later complication, without mention of misadventure at the time of the procedure: Secondary | ICD-10-CM | POA: Diagnosis present

## 2022-09-04 DIAGNOSIS — R29810 Facial weakness: Secondary | ICD-10-CM | POA: Diagnosis not present

## 2022-09-04 DIAGNOSIS — Z66 Do not resuscitate: Secondary | ICD-10-CM | POA: Diagnosis not present

## 2022-09-04 DIAGNOSIS — R0689 Other abnormalities of breathing: Secondary | ICD-10-CM | POA: Diagnosis not present

## 2022-09-04 DIAGNOSIS — R4182 Altered mental status, unspecified: Secondary | ICD-10-CM | POA: Diagnosis not present

## 2022-09-04 HISTORY — DX: Kidney transplant status: Z94.0

## 2022-09-04 HISTORY — DX: Type 2 diabetes mellitus without complications: E11.9

## 2022-09-04 LAB — URINALYSIS, ROUTINE W REFLEX MICROSCOPIC
Bilirubin Urine: NEGATIVE
Glucose, UA: NEGATIVE mg/dL
Hgb urine dipstick: NEGATIVE
Ketones, ur: 5 mg/dL — AB
Leukocytes,Ua: NEGATIVE
Nitrite: NEGATIVE
Protein, ur: NEGATIVE mg/dL
Specific Gravity, Urine: 1.011 (ref 1.005–1.030)
pH: 5 (ref 5.0–8.0)

## 2022-09-04 LAB — COMPREHENSIVE METABOLIC PANEL
ALT: 9 U/L (ref 0–44)
AST: 21 U/L (ref 15–41)
Albumin: 3.2 g/dL — ABNORMAL LOW (ref 3.5–5.0)
Alkaline Phosphatase: 37 U/L — ABNORMAL LOW (ref 38–126)
Anion gap: 17 — ABNORMAL HIGH (ref 5–15)
BUN: 48 mg/dL — ABNORMAL HIGH (ref 8–23)
CO2: 24 mmol/L (ref 22–32)
Calcium: 8.7 mg/dL — ABNORMAL LOW (ref 8.9–10.3)
Chloride: 92 mmol/L — ABNORMAL LOW (ref 98–111)
Creatinine, Ser: 2.74 mg/dL — ABNORMAL HIGH (ref 0.61–1.24)
GFR, Estimated: 24 mL/min — ABNORMAL LOW (ref >60)
Glucose, Bld: 135 mg/dL — ABNORMAL HIGH (ref 70–99)
Potassium: 4.5 mmol/L (ref 3.5–5.1)
Sodium: 133 mmol/L — ABNORMAL LOW (ref 135–145)
Total Bilirubin: 1.4 mg/dL — ABNORMAL HIGH (ref 0.3–1.2)
Total Protein: 6.3 g/dL — ABNORMAL LOW (ref 6.5–8.1)

## 2022-09-04 LAB — CBC
HCT: 30.1 % — ABNORMAL LOW (ref 39.0–52.0)
Hemoglobin: 9.3 g/dL — ABNORMAL LOW (ref 13.0–17.0)
MCH: 27.4 pg (ref 26.0–34.0)
MCHC: 30.9 g/dL (ref 30.0–36.0)
MCV: 88.8 fL (ref 80.0–100.0)
Platelets: 158 10*3/uL (ref 150–400)
RBC: 3.39 MIL/uL — ABNORMAL LOW (ref 4.22–5.81)
RDW: 18.6 % — ABNORMAL HIGH (ref 11.5–15.5)
WBC: 5.8 10*3/uL (ref 4.0–10.5)
nRBC: 0.3 % — ABNORMAL HIGH (ref 0.0–0.2)

## 2022-09-04 LAB — GLUCOSE, CAPILLARY: Glucose-Capillary: 106 mg/dL — ABNORMAL HIGH (ref 70–99)

## 2022-09-04 LAB — RESP PANEL BY RT-PCR (RSV, FLU A&B, COVID)  RVPGX2
Influenza A by PCR: NEGATIVE
Influenza B by PCR: NEGATIVE
Resp Syncytial Virus by PCR: NEGATIVE
SARS Coronavirus 2 by RT PCR: POSITIVE — AB

## 2022-09-04 LAB — CBG MONITORING, ED: Glucose-Capillary: 127 mg/dL — ABNORMAL HIGH (ref 70–99)

## 2022-09-04 LAB — LIPASE, BLOOD: Lipase: 111 U/L — ABNORMAL HIGH (ref 11–51)

## 2022-09-04 MED ORDER — SENNOSIDES-DOCUSATE SODIUM 8.6-50 MG PO TABS: 1.0000 | ORAL_TABLET | Freq: Every evening | ORAL | Status: DC | PRN

## 2022-09-04 MED ORDER — SODIUM CHLORIDE 0.9 % IV BOLUS
500.0000 mL | Freq: Once | INTRAVENOUS | Status: AC
  Administered 2022-09-04: 500 mL via INTRAVENOUS

## 2022-09-04 MED ORDER — ENOXAPARIN SODIUM 30 MG/0.3ML IJ SOSY
30.0000 mg | PREFILLED_SYRINGE | INTRAMUSCULAR | Status: DC
  Administered 2022-09-04: 30 mg via SUBCUTANEOUS
  Filled 2022-09-04: qty 0.3

## 2022-09-04 MED ORDER — INSULIN ASPART 100 UNIT/ML IJ SOLN
0.0000 [IU] | Freq: Three times a day (TID) | INTRAMUSCULAR | Status: DC
  Administered 2022-09-08 – 2022-09-11 (×2): 2 [IU] via SUBCUTANEOUS

## 2022-09-04 MED ORDER — ACETAMINOPHEN 650 MG RE SUPP: 650.0000 mg | Freq: Four times a day (QID) | RECTAL | Status: DC | PRN

## 2022-09-04 MED ORDER — SODIUM CHLORIDE 0.9 % IV BOLUS
1000.0000 mL | Freq: Once | INTRAVENOUS | Status: AC
  Administered 2022-09-04: 1000 mL via INTRAVENOUS

## 2022-09-04 MED ORDER — ONDANSETRON HCL 4 MG/2ML IJ SOLN: 4.0000 mg | Freq: Four times a day (QID) | INTRAMUSCULAR | Status: DC | PRN

## 2022-09-04 MED ORDER — SODIUM CHLORIDE 0.9 % IV SOLN: 100.0000 mg | Freq: Every day | INTRAVENOUS | Status: DC

## 2022-09-04 MED ORDER — ACETAMINOPHEN 325 MG PO TABS
650.0000 mg | ORAL_TABLET | Freq: Four times a day (QID) | ORAL | Status: DC | PRN
  Administered 2022-09-05 – 2022-09-09 (×3): 650 mg via ORAL
  Filled 2022-09-04 (×3): qty 2

## 2022-09-04 MED ORDER — SODIUM CHLORIDE 0.9 % IV SOLN: 200.0000 mg | Freq: Once | INTRAVENOUS | Status: DC

## 2022-09-04 MED ORDER — SODIUM CHLORIDE 0.9 % IV SOLN: INTRAVENOUS | Status: DC

## 2022-09-04 MED ORDER — ONDANSETRON HCL 4 MG PO TABS: 4.0000 mg | ORAL_TABLET | Freq: Four times a day (QID) | ORAL | Status: DC | PRN

## 2022-09-04 MED ORDER — ENSURE ENLIVE PO LIQD
237.0000 mL | Freq: Two times a day (BID) | ORAL | Status: DC
  Administered 2022-09-04 – 2022-09-10 (×10): 237 mL via ORAL
  Filled 2022-09-04: qty 237

## 2022-09-04 MED ORDER — GUAIFENESIN ER 600 MG PO TB12
600.0000 mg | ORAL_TABLET | Freq: Two times a day (BID) | ORAL | Status: DC
  Administered 2022-09-04 – 2022-09-14 (×19): 600 mg via ORAL
  Filled 2022-09-04 (×19): qty 1

## 2022-09-04 NOTE — ED Triage Notes (Addendum)
EMS stated pt coming from home with diarrhea for the last week and fluid intake is poor. A cough with white mixed in.left lower breath sounds left.  Pt feels dizzy when standing. No N/V. The wife is the primary care person.

## 2022-09-04 NOTE — ED Notes (Signed)
ED TO INPATIENT HANDOFF REPORT   S Name/Age/Gender Rollen Sox 72 y.o. male Room/Bed: 046C/046C  Code Status   Code Status: Full Code  Home/SNF/Other Home Patient oriented to: self, place, time, and situation Is this baseline? Yes   Triage Complete: Triage complete  Chief Complaint COVID-19 virus infection [U07.1]  Triage Note EMS stated pt coming from home with diarrhea for the last week and fluid intake is poor. A cough with white mixed in.left lower breath sounds left.  Pt feels dizzy when standing. No N/V. The wife is the primary care person.    Allergies No Known Allergies  Level of Care/Admitting Diagnosis ED Disposition     ED Disposition  Admit   Condition  --   Comment  Hospital Area: [facility] [[id number]]  Level of Care: Telemetry Medical [104]  May place patient in observation at [facility] or [facility] if equivalent level of care is available:: No  Covid Evaluation: Confirmed COVID Positive  Diagnosis: COVID-19 virus infection [id number]  Admitting Physician: Aldine Contes [id number]  Attending Physician: Aldine Contes [id number]          B Medical/Surgery History Past Medical History:  Diagnosis Date   Cancer ([place]) 2017   prostate cancer   Chronic kidney disease    Dialysis Tues-Thurs.Saturday [facility]   Constipation    Headache    occ   Hypertension    Neck pain    pulled muscle in neck on cyclobenzaprine prn   Past Surgical History:  Procedure Laterality Date   AV FISTULA PLACEMENT Left 11/23/2013   Procedure: ARTERIOVENOUS (AV) FISTULA CREATION VS. GRAFT;  Surgeon: Rosetta Posner, MD;  Location: [facility];  Service: Vascular;  Laterality: Left;   AV FISTULA PLACEMENT Left 07/12/2017   Procedure: INSERTION OF ARTERIOVENOUS (AV) GORE-TEX GRAFT ARM LEFT UPPER ARM;  Surgeon: Elam Dutch, MD;  Location: [facility] OR;  Service: Vascular;  Laterality: Left;   AV FISTULA PLACEMENT Right 10/21/2017    Procedure: ARTERIOVENOUS ARM (AV) FISTULA CREATION;  Surgeon: Elam Dutch, MD;  Location: [facility];  Service: Vascular;  Laterality: Right;   COLONOSCOPY WITH PROPOFOL N/A 03/25/2015   Procedure: COLONOSCOPY WITH PROPOFOL;  Surgeon: Carol Ada, MD;  Location: [facility];  Service: Endoscopy;  Laterality: N/A;   COLONOSCOPY WITH PROPOFOL N/A 05/09/2018   Procedure: COLONOSCOPY WITH PROPOFOL;  Surgeon: Carol Ada, MD;  Location: [facility];  Service: Endoscopy;  Laterality: N/A;  aborted due to prep   INSERTION OF DIALYSIS CATHETER N/A 11/23/2013   Procedure: INSERTION OF DIALYSIS CATHETER RIGHT INTERNAL JUGULAR VEIN;  Surgeon: Rosetta Posner, MD;  Location: [facility];  Service: Vascular;  Laterality: N/A;   PROSTATECTOMY     THROMBECTOMY AND REVISION OF ARTERIOVENTOUS (AV) GORETEX  GRAFT Left 09/02/2017   Procedure: THROMBECTOMY  OF Left Arm  ARTERIOVENTOUS (AV) GORETEX  GRAFT;  Surgeon: Angelia Mould, MD;  Location: [facility] OR;  Service: Vascular;  Laterality: Left;     A IV Location/Drains/Wounds Patient Lines/Drains/Airways Status     Active Line/Drains/Airways     Name Placement date Placement time Site Days   Peripheral IV 07/30/18 Left;Posterior Forearm 07/30/18  2226  Forearm  1497   Peripheral IV 09/09/2022 22 G 1.75" Anterior;Left Forearm 08/24/2022  1005  Forearm  less than 1   Fistula / Graft Left 11/23/13  --  --  3207   Fistula / Graft Left Upper arm Arteriovenous vein graft 07/12/17  1242  Upper  arm  1880   Fistula / Graft Left Upper arm Arteriovenous vein graft 09/02/17  0851  Upper arm  1828   Fistula / Graft Right Forearm Arteriovenous fistula 10/21/17  0826  Forearm  1779   Hemodialysis Catheter Right 11/23/13  0755  Internal jugular  3207   Incision (Closed) 11/23/13 Arm Left 11/23/13  0721  -- 3207   Incision (Closed) 07/12/17 Arm Left 07/12/17  1301  -- 1880   Incision (Closed) 09/02/17 Arm Left 09/02/17  0807  -- 1828   Incision (Closed) 10/21/17 Arm Right 10/21/17   0814  -- 1779            Intake/Output Last 24 hours No intake or output data in the 24 hours ending 08/25/2022 1510  Labs/Imaging Results for orders placed or performed during the hospital encounter of 08/24/2022 (from the past 48 hour(s))  CBC     Status: Abnormal   Collection Time: 09/09/2022  8:51 AM  Result Value Ref Range   WBC 5.8 4.0 - 10.5 K/uL   RBC 3.39 (L) 4.22 - 5.81 MIL/uL   Hemoglobin 9.3 (L) 13.0 - 17.0 g/dL   HCT 30.1 (L) 39.0 - 52.0 %   MCV 88.8 80.0 - 100.0 fL   MCH 27.4 26.0 - 34.0 pg   MCHC 30.9 30.0 - 36.0 g/dL   RDW 18.6 (H) 11.5 - 15.5 %   Platelets 158 150 - 400 K/uL   nRBC 0.3 (H) 0.0 - 0.2 %    Comment: Performed at [facility], [address] [place], [place] [address]  Lipase, blood     Status: Abnormal   Collection Time: 08/28/2022  8:51 AM  Result Value Ref Range   Lipase 111 (H) 11 - 51 U/L    Comment: Performed at [facility], [place] [address], [place], [place] [address]  Comprehensive metabolic panel     Status: Abnormal   Collection Time: 08/26/2022  9:19 AM  Result Value Ref Range   Sodium 133 (L) 135 - 145 mmol/L   Potassium 4.5 3.5 - 5.1 mmol/L    Comment: HEMOLYSIS AT THIS LEVEL MAY AFFECT RESULT   Chloride 92 (L) 98 - 111 mmol/L   CO2 24 22 - 32 mmol/L   Glucose, Bld 135 (H) 70 - 99 mg/dL    Comment: Glucose reference range applies only to samples taken after fasting for at least 8 hours.   BUN 48 (H) 8 - 23 mg/dL   Creatinine, Ser 2.74 (H) 0.61 - 1.24 mg/dL   Calcium 8.7 (L) 8.9 - 10.3 mg/dL   Total Protein 6.3 (L) 6.5 - 8.1 g/dL   Albumin 3.2 (L) 3.5 - 5.0 g/dL   AST 21 15 - 41 U/L    Comment: HEMOLYSIS AT THIS LEVEL MAY AFFECT RESULT   ALT 9 0 - 44 U/L    Comment: HEMOLYSIS AT THIS LEVEL MAY AFFECT RESULT   Alkaline Phosphatase 37 (L) 38 - 126 U/L   Total Bilirubin 1.4 (H) 0.3 - 1.2 mg/dL    Comment: HEMOLYSIS AT THIS LEVEL MAY AFFECT RESULT   GFR, Estimated 24 (L) >60 mL/min    Comment: (NOTE) Calculated using the  CKD-EPI Creatinine Equation (2021)    Anion gap 17 (H) 5 - 15    Comment: Performed at [facility], [place] [address] [place], [place] [address]  Resp panel by RT-PCR (RSV, Flu A&B, Covid) Anterior Nasal Swab     Status: Abnormal  Collection Time: 09/09/2022  9:20 AM   Specimen: Anterior Nasal Swab  Result Value Ref Range   SARS Coronavirus 2 by RT PCR POSITIVE (A) NEGATIVE   Influenza A by PCR NEGATIVE NEGATIVE   Influenza B by PCR NEGATIVE NEGATIVE    Comment: (NOTE) The Xpert Xpress SARS-CoV-2/FLU/RSV plus assay is intended as an aid in the diagnosis of influenza from Nasopharyngeal swab specimens and should not be used as a sole basis for treatment. Nasal washings and aspirates are unacceptable for Xpert Xpress SARS-CoV-2/FLU/RSV testing.  Fact Sheet for Patients: EntrepreneurPulse.com.au  Fact Sheet for Healthcare Providers: IncredibleEmployment.be  This test is not yet approved or cleared by the [place] FDA and has been authorized for detection and/or diagnosis of SARS-CoV-2 by FDA under an Emergency Use Authorization (EUA). This EUA will remain in effect (meaning this test can be used) for the duration of the COVID-19 declaration under Section 564(b)(1) of the Act, 21 U.S.C. section 360bbb-3(b)(1), unless the authorization is terminated or revoked.     Resp Syncytial Virus by PCR NEGATIVE NEGATIVE    Comment: (NOTE) Fact Sheet for Patients: EntrepreneurPulse.com.au  Fact Sheet for Healthcare Providers: IncredibleEmployment.be  This test is not yet approved or cleared by the [place] FDA and has been authorized for detection and/or diagnosis of SARS-CoV-2 by FDA under an Emergency Use Authorization (EUA). This EUA will remain in effect (meaning this test can be used) for the duration of the COVID-19 declaration under Section 564(b)(1) of the Act, 21 U.S.C. section 360bbb-3(b)(1),  unless the authorization is terminated or revoked.  Performed at [facility], [place] [address] [place], [place] [address]   CBG monitoring, ED     Status: Abnormal   Collection Time: 08/24/2022  9:33 AM  Result Value Ref Range   Glucose-Capillary 127 (H) 70 - 99 mg/dL    Comment: Glucose reference range applies only to samples taken after fasting for at least 8 hours.  Urinalysis, Routine w reflex microscopic -Urine, Clean Catch     Status: Abnormal   Collection Time: 08/27/2022  9:45 AM  Result Value Ref Range   Color, Urine YELLOW YELLOW   APPearance CLEAR CLEAR   Specific Gravity, Urine 1.011 1.005 - 1.030   pH 5.0 5.0 - 8.0   Glucose, UA NEGATIVE NEGATIVE mg/dL   Hgb urine dipstick NEGATIVE NEGATIVE   Bilirubin Urine NEGATIVE NEGATIVE   Ketones, ur 5 (A) NEGATIVE mg/dL   Protein, ur NEGATIVE NEGATIVE mg/dL   Nitrite NEGATIVE NEGATIVE   Leukocytes,Ua NEGATIVE NEGATIVE    Comment: Performed at [facility] [address], [place],  [address]   DG Chest Port 1 View  Result Date: 09/06/2022 CLINICAL DATA:  Cough EXAM: PORTABLE CHEST 1 VIEW COMPARISON:  11/23/2013 FINDINGS: Cardiac silhouette appears prominent. No pneumonia or pulmonary edema. No pneumothorax or pleural effusion. Aorta is calcified. There are thoracic degenerative changes. IMPRESSION: Enlarged cardiac silhouette.  No focal consolidation. Electronically Signed   By: Sammie Bench M.D.   On: 08/26/2022 10:36    Pending Labs Unresulted Labs (From admission, onward)     Start     Ordered   09/05/22 0500  Renal function panel  Tomorrow morning,   R       Question:  Specimen collection method  Answer:  IV Team=IV Team collect   09/09/2022 1505   09/05/22 0500  CBC  Tomorrow morning,   R       Question:  Specimen collection method  Answer:  IV Team=IV Team collect   09/11/2022 1505   08/24/2022 1459  Magnesium  Add-on,   AD       Question:  Specimen collection method  Answer:  IV Team=IV Team collect    09/07/2022 1458   09/06/2022 1459  Hemoglobin A1c  Add-on,   AD       Question:  Specimen collection method  Answer:  IV Team=IV Team collect   08/30/2022 1458   09/10/2022 1459  Phosphorus  Add-on,   AD       Question:  Specimen collection method  Answer:  IV Team=IV Team collect   08/29/2022 1458            Vitals/Pain Today's Vitals   09/06/2022 1300 09/03/2022 1306 08/31/2022 1309 09/09/2022 1400  BP: 119/71   122/67  Pulse: (!) 103 94  85  Resp: 18 19  15  $ Temp:      TempSrc:      SpO2: 99% 100%  99%  Weight:      Height:      PainSc:   0-No pain     Isolation Precautions Airborne and Contact precautions  Medications Medications  sodium chloride 0.9 % bolus 1,000 mL (has no administration in time range)  enoxaparin (LOVENOX) injection 30 mg (has no administration in time range)  acetaminophen (TYLENOL) tablet 650 mg (has no administration in time range)    Or  acetaminophen (TYLENOL) suppository 650 mg (has no administration in time range)  senna-docusate (Senokot-S) tablet 1 tablet (has no administration in time range)  ondansetron (ZOFRAN) tablet 4 mg (has no administration in time range)    Or  ondansetron (ZOFRAN) injection 4 mg (has no administration in time range)  sodium chloride 0.9 % bolus 500 mL (0 mLs Intravenous Stopped 08/27/2022 1116)    Mobility walks with device     Focused Assessments  R Recommendations: See Admitting Provider Note  Report given to:

## 2022-09-04 NOTE — ED Provider Notes (Signed)
[facility] Provider Note   CSN: [id number] Arrival date & time: 09/09/2022  [id number]     History  Chief Complaint  Patient presents with   Weakness   Diarrhea    Andrew Brandt is a 72 y.o. male.  Patient complains of a cough and diarrhea for 1 week.  Patient complains of increased weakness and dizziness today.  Patient reports he has not been eating or drinking well.  Patient has had a renal transplant in 2014.  Patient denies having any fever or chills.  Patient reports he is followed by Dr. Posey Pronto of nephrology  The history is provided by the patient. No language interpreter was used.  Weakness Severity:  Moderate Onset quality:  Gradual Duration:  1 week Timing:  Constant Chronicity:  New Context: dehydration and recent infection   Relieved by:  Nothing Worsened by:  Nothing Associated symptoms: diarrhea   Associated symptoms: no abdominal pain   Diarrhea Associated symptoms: no abdominal pain        Home Medications Prior to Admission medications   Medication Sig Start Date End Date Taking? Authorizing Provider  acetaminophen (TYLENOL) 500 MG tablet Take 1,000 mg by mouth every 6 (six) hours as needed for mild pain or headache.     [provider]  b complex-vitamin c-folic acid (NEPHRO-VITE) 0.8 MG TABS tablet Take 1 tablet by mouth daily.    [provider]  potassium chloride SA (K-DUR,KLOR-CON) 20 MEQ tablet Take 1 tablet (20 mEq total) by mouth 2 (two) times daily. [id number]   Delora Fuel, MD  VELPHORO 500 MG chewable tablet Chew 500-1,000 mg by mouth See admin instructions. Take 2 tablets (1000 mg) by mouth with meals & take 1 tablet (500 mg) by mouth with snacks. 03/11/18   [provider]      Allergies    Patient has no known allergies.    Review of Systems   Review of Systems  Gastrointestinal:  Positive for diarrhea. Negative for abdominal pain.  Neurological:  Positive for weakness.   All other systems reviewed and are negative.   Physical Exam Updated Vital Signs BP 114/70   Pulse 86   Temp 97.6 F (36.4 C) (Oral)   Resp (!) 24   Ht 5' 9"$  (1.753 m)   Wt 104.3 kg   SpO2 95%   BMI 33.97 kg/m  Physical Exam Vitals and nursing note reviewed.  Constitutional:      Appearance: He is well-developed.  HENT:     Head: Normocephalic.  Eyes:     Pupils: Pupils are equal, round, and reactive to light.  Cardiovascular:     Rate and Rhythm: Normal rate and regular rhythm.  Pulmonary:     Effort: Pulmonary effort is normal.  Abdominal:     General: Abdomen is flat. There is no distension.  Musculoskeletal:        General: Normal range of motion.     Cervical back: Normal range of motion.  Skin:    General: Skin is warm.  Neurological:     General: No focal deficit present.     Mental Status: He is alert and oriented to person, place, and time.     ED Results / Procedures / Treatments   Labs (all labs ordered are listed, but only abnormal results are displayed) Labs Reviewed  RESP PANEL BY RT-PCR (RSV, FLU A&B, COVID)  [id number] - Abnormal; Notable for the following components:  Result Value   SARS Coronavirus 2 by RT PCR POSITIVE (*)    All other components within normal limits  CBC - Abnormal; Notable for the following components:   RBC 3.39 (*)    Hemoglobin 9.3 (*)    HCT 30.1 (*)    RDW 18.6 (*)    nRBC 0.3 (*)    All other components within normal limits  URINALYSIS, ROUTINE W REFLEX MICROSCOPIC - Abnormal; Notable for the following components:   Ketones, ur 5 (*)    All other components within normal limits  LIPASE, BLOOD - Abnormal; Notable for the following components:   Lipase 111 (*)    All other components within normal limits  COMPREHENSIVE METABOLIC PANEL - Abnormal; Notable for the following components:   Sodium 133 (*)    Chloride 92 (*)    Glucose, Bld 135 (*)    BUN 48 (*)    Creatinine, Ser 2.74 (*)    Calcium 8.7 (*)     Total Protein 6.3 (*)    Albumin 3.2 (*)    Alkaline Phosphatase 37 (*)    Total Bilirubin 1.4 (*)    GFR, Estimated 24 (*)    Anion gap 17 (*)    All other components within normal limits  CBG MONITORING, ED - Abnormal; Notable for the following components:   Glucose-Capillary 127 (*)    All other components within normal limits    EKG None  Radiology DG Chest Port 1 View  Result Date: 09/03/2022 CLINICAL DATA:  Cough EXAM: PORTABLE CHEST 1 VIEW COMPARISON:  11/23/2013 FINDINGS: Cardiac silhouette appears prominent. No pneumonia or pulmonary edema. No pneumothorax or pleural effusion. Aorta is calcified. There are thoracic degenerative changes. IMPRESSION: Enlarged cardiac silhouette.  No focal consolidation. Electronically Signed   By: Sammie Bench M.D.   On: 09/05/2022 10:36    Procedures Procedures    Medications Ordered in ED Medications  sodium chloride 0.9 % bolus 500 mL (0 mLs Intravenous Stopped 09/05/2022 1116)    ED Course/ Medical Decision Making/ A&P                             Medical Decision Making Patient complains of a cough and congestion for the past week patient has had persistent diarrhea for the past week patient is here with his wife who is supportive she helps provide patient's history  Amount and/or Complexity of Data Reviewed Independent Historian: spouse    Details: Wife reports patient has become increasingly weak he is not eating he is not drinking and he has been having diarrhea External Data Reviewed: notes.    Details: [facility] and nephrology notes reviewed Labs: ordered. Decision-making details documented in ED Course.    Details: Mistry's are BUN of 48 creatinine is 2.74 Patient's COVID test is positive Radiology: ordered. Discussion of management or test interpretation with external provider(s): Patient sees Dr. Iona Beard Osei-Bonsu who is his primary care physician I spoke to unassigned internal medicine who will admit  Risk Risk  Details: Gave patient IV fluid 500 cc bolus    I discussed the laboratory results with patient and his wife        Final Clinical Impression(s) / ED Diagnoses Final diagnoses:  Dehydration  COVID  Weakness    Rx / DC Orders ED Discharge Orders     None         Fransico Meadow, [place] 09/15/2022 1311  Carmin Muskrat, MD 09/13/2022 1539

## 2022-09-04 NOTE — H&P (Cosign Needed Addendum)
Date: 09/03/2022               Patient Name:  Andrew Brandt MRN: [id number]  DOB: 11/16/1950 Age / Sex: 72 y.o., male   PCP: Benito Mccreedy, MD         Medical Service: Internal Medicine Teaching Service         Attending Physician: Dr. Aldine Contes, MD    First Contact: Dr. Gaylyn Rong, MD Pager: [phone]  Second Contact: Dr. Virl Axe, MD Pager: [phone]       After Hours (After 5p/  First Contact Pager: [phone]  weekends / holidays): Second Contact Pager: [phone]   Chief Complaint: Generalized weakness/diarrhea  History of Present Illness: 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 Spouse reports that she had an upper respiratory tract infection about 2 weeks ago she was prescribed cough medication to take.  Her symptoms resolved however about 5-7 days, patient started coughing. He took Robitussin which helped with the cough but he also started having watery stools mostly at night and in the morning. His cough returned over the weekend and has been productive with clear sputum. He has had decreased appetite and decreased p.o. intake but denies any nausea, vomiting, abdominal pain, fevers, chills, shortness of breath, chest pain, headaches, sore throat or rash. Per spouse, patient has been weak and fatigue and she called EMS today when she was unable to get patient off the commode.  ED course: Labs show WBC 5.8, Hgb 9.3, lipase 111, creatinine 2.74, K+ 4.5, sodium 133, UA significant for ketonuria.  Respiratory panel positive for COVID-19. S/p 500 cc IV NS. IMTS consulted for admission  Meds:  No outpatient medications have been marked as taking for the 08/28/2022 encounter [facility] Encounter).    Allergies: Allergies as of 09/03/2022   (No Known Allergies)   Past Medical History:   Diagnosis Date   Cancer ([place]) 2017   prostate cancer   Chronic kidney disease    Dialysis Tues-Thurs.Saturday [facility]   Constipation    Headache    occ   Hypertension    Neck pain    pulled muscle in neck on cyclobenzaprine prn    Family History:  Family History  Problem Relation Age of Onset   Cancer Mother    Diabetes Other        grandparents    Social History: Lives with wife in [place].  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 Independent with most of his ADLs.  Wife cooks for him and helps with groceries. Ambulates without assistive device.  Denies any tobacco, EtOH or illicit drug use. PCP: Dr. Vista Lawman  Review of Systems: A complete ROS was negative except as per HPI.   Physical Exam: Blood pressure 122/67, pulse 85, temperature 97.6 F (36.4 C), temperature source Oral, resp. rate 15, height 5' 9"$  (1.753 m), weight 104.3 kg, SpO2 99 %.  General: Pleasant, chronically ill elderly man laying in bed. No acute distress. HEENT: Pale conjunctiva.  Dry mucous membrane. CV: RRR. No murmurs, rubs, or gallops. No LE edema Pulmonary: Lungs CTAB. Normal effort. Distant rales at the lung bases. No wheezing. Abdominal: Soft, nontender, nondistended. Normal bowel sounds. Extremities: Radial and DP pulses 2+ and symmetric. RUE AVF with palpable thrill. Skin: Warm and dry. Decreased skin  turgor. Neuro: A&Ox3. Moves all extremities. Normal sensation. No focal deficit. Psych: Normal mood and affect  EKG: personally reviewed my interpretation is sinus with frequent PVCs  CXR: personally reviewed my interpretation is mild cardiomegaly with no pleural effusion or vascular congestion.  Assessment & Plan by Problem: Principal Problem:   COVID-19 virus infection  Mr. 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   #COVID-19 infection #Generalized weakness Patient with a history of ESRD s/p renal transplant in July 2020 presented via EMS for evaluation of generalized weakness, diarrhea and poor appetite. Patient exposed to wife who had a viral infection 2 weeks ago. Patient started having symptoms about 5 to 7 days ago including a cough that initially improved but has now become productive. He has had watery stools and decreased appetite leading to significant generalized weakness and fatigue. On admission, he is afebrile with no WBC but has had worsening cough and weakness for the last 2 to 3 days. RVP positive for COVID19. No infiltrate on CXR and he is satting well on room air at the moment. While patient has some risk factors for progression to severe disease, he is out of the window to get any benefit from the remdesivir therapy. -O2 supplementation as needed -IVNS 100 cc/h -Incentive spirometer, pulm toilet -Mucinex 600 mg twice daily -F/u CBC and BMP -PT/OT  #ESRD s/p renal transplant #AKI on CKD4? Patient with a history of ESRD secondary to hypertension will receive HD for 4 years. Received a kidney transplant in July 2020 has been following by [facility] transplant team and [facility]. He [facility]. Over the last 4 months, his creatinine 2.65->2.28->2.99->2.71. His creatinine 2.74 on admission. Unclear patient's baseline creatinine however is likely an element of AKI in the setting of p.o. p.o. intake and dehydration. Pending confirmation of meds from wife.  -Mycophenolate 180 mg twice daily -Prednisone 5 mg daily -Bactrim 400-80 mg every Monday, Wednesday and Friday  #HTN BP stable with SBP in the 110s to 120s.  Patient dry on exam.  On Coreg 25 mg twice daily.  Will hold antihypertensive for today and consider resuming tomorrow.  -Check orthostatic vitals -Hold  Coreg  #T2DM Per spouse, patient was recently diagnosed with diabetes and started on metformin 500 mg twice daily. A1c 7.0 6 months ago.  -Check A1c -SSI w/ meals, CBG monitoring -Hold metformin   CODE STATUS: Full DIET: Carb modified PPx: Lovenox  Dispo: Admit patient to Observation with expected length of stay less than 2 midnights.  Signed: Lacinda Axon, MD 08/28/2022, 2:33 PM  Pager: [phone] Internal Medicine Teaching Service After 5pm on weekdays and 1pm on weekends: On Call pager: [phone]

## 2022-09-04 NOTE — ED Notes (Signed)
IV team at bedside 

## 2022-09-04 NOTE — ED Notes (Signed)
Patient given ensure, urinal and water. Patient girlfriend at bedside.

## 2022-09-04 NOTE — ED Notes (Signed)
ED TO INPATIENT HANDOFF REPORT  ED Nurse Name and Phone #:  [phone] [place] Name/Age/Gender Andrew Brandt 72 y.o. male Room/Bed: 046C/046C  Code Status   Code Status: Full Code  Home/SNF/Other Home Patient oriented to: self, place, and situation Is this baseline? No   Triage Complete: Triage complete  Chief Complaint COVID-19 virus infection [U07.1]  Triage Note EMS stated pt coming from home with diarrhea for the last week and fluid intake is poor. A cough with white mixed in.left lower breath sounds left.  Pt feels dizzy when standing. No N/V. The wife is the primary care person.    Allergies No Known Allergies  Level of Care/Admitting Diagnosis ED Disposition     ED Disposition  Admit   Condition  --   Comment  Hospital Area: [facility] [[id number]]  Level of Care: Telemetry Medical [104]  May place patient in observation at [facility] if equivalent level of care is available:: No  Covid Evaluation: Confirmed COVID Positive  Diagnosis: COVID-19 virus infection [id number]  Admitting Physician: Aldine Contes [id number]  Attending Physician: Aldine Contes [id number]          B Medical/Surgery History Past Medical History:  Diagnosis Date   Cancer ([place]) 2017   prostate cancer   Chronic kidney disease    Dialysis Tues-Thurs.[facility] [place]   Constipation    Headache    occ   Hypertension    Neck pain    pulled muscle in neck on cyclobenzaprine prn   Past Surgical History:  Procedure Laterality Date   AV FISTULA PLACEMENT Left 11/23/2013   Procedure: ARTERIOVENOUS (AV) FISTULA CREATION VS. GRAFT;  Surgeon: Rosetta Posner, MD;  Location: [place];  Service: Vascular;  Laterality: Left;   AV FISTULA PLACEMENT Left 07/12/2017   Procedure: INSERTION OF ARTERIOVENOUS (AV) GORE-TEX GRAFT ARM LEFT UPPER ARM;  Surgeon: Elam Dutch, MD;  Location[facility] OR;  Service: Vascular;  Laterality: Left;   AV FISTULA  PLACEMENT Right 10/21/2017   Procedure: ARTERIOVENOUS ARM (AV) FISTULA CREATION;  Surgeon: Elam Dutch, MD;  Location: [place] [place];  Service: Vascular;  Laterality: Right;   COLONOSCOPY WITH PROPOFOL N/A 03/25/2015   Procedure: COLONOSCOPY WITH PROPOFOL;  Surgeon: Carol Ada, MD;  Location: [facility];  Service: Endoscopy;  Laterality: N/A;   COLONOSCOPY WITH PROPOFOL N/A 05/09/2018   Procedure: COLONOSCOPY WITH PROPOFOL;  Surgeon: Carol Ada, MD;  Location: [facility];  Service: Endoscopy;  Laterality: N/A;  aborted due to prep   INSERTION OF DIALYSIS CATHETER N/A 11/23/2013   Procedure: INSERTION OF DIALYSIS CATHETER RIGHT INTERNAL JUGULAR VEIN;  Surgeon: Rosetta Posner, MD;  Location: [place];  Service: Vascular;  Laterality: N/A;   PROSTATECTOMY     THROMBECTOMY AND REVISION OF ARTERIOVENTOUS (AV) GORETEX  GRAFT Left 09/02/2017   Procedure: THROMBECTOMY  OF Left Arm  ARTERIOVENTOUS (AV) GORETEX  GRAFT;  Surgeon: Angelia Mould, MD;  Location: [facility] OR;  Service: Vascular;  Laterality: Left;     A IV Location/Drains/Wounds Patient Lines/Drains/Airways Status     Active Line/Drains/Airways     Name Placement date Placement time Site Days   Peripheral IV 07/30/18 Left;Posterior Forearm 07/30/18  2226  Forearm  1497   Peripheral IV 08/31/2022 22 G 1.75" Anterior;Left Forearm 08/30/2022  1005  Forearm  less than 1   Fistula / Graft Left 11/23/13  --  --  3207   Fistula / Graft Left Upper arm  Arteriovenous vein graft 07/12/17  1242  Upper arm  1880   Fistula / Graft Left Upper arm Arteriovenous vein graft 09/02/17  0851  Upper arm  1828   Fistula / Graft Right Forearm Arteriovenous fistula 10/21/17  0826  Forearm  1779   Hemodialysis Catheter Right 11/23/13  0755  Internal jugular  3207   Incision (Closed) 11/23/13 Arm Left 11/23/13  0721  -- 3207   Incision (Closed) 07/12/17 Arm Left 07/12/17  1301  -- 1880   Incision (Closed) 09/02/17 Arm Left 09/02/17  0807  -- 1828   Incision (Closed)  10/21/17 Arm Right 10/21/17  0814  -- 1779            Intake/Output Last 24 hours No intake or output data in the 24 hours ending 09/15/2022 1823  Labs/Imaging Results for orders placed or performed during the hospital encounter of 09/02/2022 (from the past 48 hour(s))  CBC     Status: Abnormal   Collection Time: 09/15/2022  8:51 AM  Result Value Ref Range   WBC 5.8 4.0 - 10.5 K/uL   RBC 3.39 (L) 4.22 - 5.81 MIL/uL   Hemoglobin 9.3 (L) 13.0 - 17.0 g/dL   HCT 30.1 (L) 39.0 - 52.0 %   MCV 88.8 80.0 - 100.0 fL   MCH 27.4 26.0 - 34.0 pg   MCHC 30.9 30.0 - 36.0 g/dL   RDW 18.6 (H) 11.5 - 15.5 %   Platelets 158 150 - 400 K/uL   nRBC 0.3 (H) 0.0 - 0.2 %    Comment: Performed at [facility], [address] [place], [place] [address]  Lipase, blood     Status: Abnormal   Collection Time: 09/15/2022  8:51 AM  Result Value Ref Range   Lipase 111 (H) 11 - 51 U/L    Comment: Performed at [facility], [place] [address] [place], [place] [address]  Comprehensive metabolic panel     Status: Abnormal   Collection Time: 08/24/2022  9:19 AM  Result Value Ref Range   Sodium 133 (L) 135 - 145 mmol/L   Potassium 4.5 3.5 - 5.1 mmol/L    Comment: HEMOLYSIS AT THIS LEVEL MAY AFFECT RESULT   Chloride 92 (L) 98 - 111 mmol/L   CO2 24 22 - 32 mmol/L   Glucose, Bld 135 (H) 70 - 99 mg/dL    Comment: Glucose reference range applies only to samples taken after fasting for at least 8 hours.   BUN 48 (H) 8 - 23 mg/dL   Creatinine, Ser 2.74 (H) 0.61 - 1.24 mg/dL   Calcium 8.7 (L) 8.9 - 10.3 mg/dL   Total Protein 6.3 (L) 6.5 - 8.1 g/dL   Albumin 3.2 (L) 3.5 - 5.0 g/dL   AST 21 15 - 41 U/L    Comment: HEMOLYSIS AT THIS LEVEL MAY AFFECT RESULT   ALT 9 0 - 44 U/L    Comment: HEMOLYSIS AT THIS LEVEL MAY AFFECT RESULT   Alkaline Phosphatase 37 (L) 38 - 126 U/L   Total Bilirubin 1.4 (H) 0.3 - 1.2 mg/dL    Comment: HEMOLYSIS AT THIS LEVEL MAY AFFECT RESULT   GFR, Estimated 24 (L) >60 mL/min    Comment:  (NOTE) Calculated using the CKD-EPI Creatinine Equation (2021)    Anion gap 17 (H) 5 - 15    Comment: Performed at [facility], [place] [address], [place],  [address]  Resp panel by RT-PCR (RSV, Flu A&B, Covid) Anterior  Nasal Swab     Status: Abnormal   Collection Time: 09/06/2022  9:20 AM   Specimen: Anterior Nasal Swab  Result Value Ref Range   SARS Coronavirus 2 by RT PCR POSITIVE (A) NEGATIVE   Influenza A by PCR NEGATIVE NEGATIVE   Influenza B by PCR NEGATIVE NEGATIVE    Comment: (NOTE) The Xpert Xpress SARS-CoV-2/FLU/RSV plus assay is intended as an aid in the diagnosis of influenza from Nasopharyngeal swab specimens and should not be used as a sole basis for treatment. Nasal washings and aspirates are unacceptable for Xpert Xpress SARS-CoV-2/FLU/RSV testing.  Fact Sheet for Patients: EntrepreneurPulse.com.au  Fact Sheet for Healthcare Providers: IncredibleEmployment.be  This test is not yet approved or cleared by the [place] FDA and has been authorized for detection and/or diagnosis of SARS-CoV-2 by FDA under an Emergency Use Authorization (EUA). This EUA will remain in effect (meaning this test can be used) for the duration of the COVID-19 declaration under Section 564(b)(1) of the Act, 21 U.S.C. section 360bbb-3(b)(1), unless the authorization is terminated or revoked.     Resp Syncytial Virus by PCR NEGATIVE NEGATIVE    Comment: (NOTE) Fact Sheet for Patients: EntrepreneurPulse.com.au  Fact Sheet for Healthcare Providers: IncredibleEmployment.be  This test is not yet approved or cleared by the [place] FDA and has been authorized for detection and/or diagnosis of SARS-CoV-2 by FDA under an Emergency Use Authorization (EUA). This EUA will remain in effect (meaning this test can be used) for the duration of the COVID-19 declaration under Section 564(b)(1) of the Act, 21  U.S.C. section 360bbb-3(b)(1), unless the authorization is terminated or revoked.  Performed at [facility], [place] [address], [place], [place] [address]   CBG monitoring, ED     Status: Abnormal   Collection Time: 08/30/2022  9:33 AM  Result Value Ref Range   Glucose-Capillary 127 (H) 70 - 99 mg/dL    Comment: Glucose reference range applies only to samples taken after fasting for at least 8 hours.  Urinalysis, Routine w reflex microscopic -Urine, Clean Catch     Status: Abnormal   Collection Time: 09/08/2022  9:45 AM  Result Value Ref Range   Color, Urine YELLOW YELLOW   APPearance CLEAR CLEAR   Specific Gravity, Urine 1.011 1.005 - 1.030   pH 5.0 5.0 - 8.0   Glucose, UA NEGATIVE NEGATIVE mg/dL   Hgb urine dipstick NEGATIVE NEGATIVE   Bilirubin Urine NEGATIVE NEGATIVE   Ketones, ur 5 (A) NEGATIVE mg/dL   Protein, ur NEGATIVE NEGATIVE mg/dL   Nitrite NEGATIVE NEGATIVE   Leukocytes,Ua NEGATIVE NEGATIVE    Comment: Performed at [facility] [address], [place], [place] [address]   DG Chest Port 1 View  Result Date: 09/12/2022 CLINICAL DATA:  Cough EXAM: PORTABLE CHEST 1 VIEW COMPARISON:  11/23/2013 FINDINGS: Cardiac silhouette appears prominent. No pneumonia or pulmonary edema. No pneumothorax or pleural effusion. Aorta is calcified. There are thoracic degenerative changes. IMPRESSION: Enlarged cardiac silhouette.  No focal consolidation. Electronically Signed   By: Sammie Bench M.D.   On: 09/10/2022 10:36    Pending Labs Unresulted Labs (From admission, onward)     Start     Ordered   09/05/22 0500  Renal function panel  Tomorrow morning,   R       Question:  Specimen collection method  Answer:  IV Team=IV Team collect   09/02/2022 1505   09/05/22 0500  CBC  Tomorrow morning,   R  Question:  Specimen collection method  Answer:  IV Team=IV Team collect   09/13/2022 1505   09/05/22 0500  Hemoglobin A1c  Tomorrow morning,   R       Question:  Specimen  collection method  Answer:  IV Team=IV Team collect   08/28/2022 1738   09/05/22 0500  Magnesium  Tomorrow morning,   R       Question:  Specimen collection method  Answer:  IV Team=IV Team collect   09/03/2022 1738   09/05/22 0500  Phosphorus  Tomorrow morning,   R       Question:  Specimen collection method  Answer:  IV Team=IV Team collect   09/06/2022 1738            Vitals/Pain Today's Vitals   08/27/2022 1300 09/08/2022 1306 08/25/2022 1309 08/24/2022 1400  BP: 119/71   122/67  Pulse: (!) 103 94  85  Resp: 18 19  15  $ Temp:      TempSrc:      SpO2: 99% 100%  99%  Weight:      Height:      PainSc:   0-No pain     Isolation Precautions Airborne and Contact precautions  Medications Medications  enoxaparin (LOVENOX) injection 30 mg (30 mg Subcutaneous Given 09/01/2022 1749)  acetaminophen (TYLENOL) tablet 650 mg (has no administration in time range)    Or  acetaminophen (TYLENOL) suppository 650 mg (has no administration in time range)  senna-docusate (Senokot-S) tablet 1 tablet (has no administration in time range)  ondansetron (ZOFRAN) tablet 4 mg (has no administration in time range)    Or  ondansetron (ZOFRAN) injection 4 mg (has no administration in time range)  guaiFENesin (MUCINEX) 12 hr tablet 600 mg (has no administration in time range)  0.9 %  sodium chloride infusion ( Intravenous New Bag/Given 09/05/2022 1751)  feeding supplement (ENSURE ENLIVE / ENSURE PLUS) liquid 237 mL (237 mLs Oral Given 09/13/2022 1721)  insulin aspart (novoLOG) injection 0-15 Units ( Subcutaneous Not Given 09/19/2022 1822)  sodium chloride 0.9 % bolus 500 mL (0 mLs Intravenous Stopped 09/02/2022 1116)  sodium chloride 0.9 % bolus 1,000 mL (1,000 mLs Intravenous New Bag/Given 09/10/2022 1537)    Mobility walks with device     Focused Assessments Pulmonary Assessment Handoff:  Lung sounds:          R Recommendations: See Admitting Provider Note  Report given to:   Additional Notes:

## 2022-09-05 ENCOUNTER — Encounter (HOSPITAL_COMMUNITY): Payer: Self-pay | Admitting: Internal Medicine

## 2022-09-05 ENCOUNTER — Other Ambulatory Visit (HOSPITAL_COMMUNITY): Payer: Medicare Other

## 2022-09-05 ENCOUNTER — Inpatient Hospital Stay (HOSPITAL_COMMUNITY): Payer: Medicare Other

## 2022-09-05 DIAGNOSIS — K5939 Other megacolon: Secondary | ICD-10-CM | POA: Diagnosis not present

## 2022-09-05 DIAGNOSIS — K828 Other specified diseases of gallbladder: Secondary | ICD-10-CM | POA: Diagnosis not present

## 2022-09-05 DIAGNOSIS — Z940 Kidney transplant status: Secondary | ICD-10-CM

## 2022-09-05 DIAGNOSIS — Z7189 Other specified counseling: Secondary | ICD-10-CM | POA: Diagnosis not present

## 2022-09-05 DIAGNOSIS — Z87891 Personal history of nicotine dependence: Secondary | ICD-10-CM | POA: Diagnosis not present

## 2022-09-05 DIAGNOSIS — D849 Immunodeficiency, unspecified: Secondary | ICD-10-CM

## 2022-09-05 DIAGNOSIS — I4891 Unspecified atrial fibrillation: Secondary | ICD-10-CM

## 2022-09-05 DIAGNOSIS — R531 Weakness: Secondary | ICD-10-CM | POA: Diagnosis present

## 2022-09-05 DIAGNOSIS — D62 Acute posthemorrhagic anemia: Secondary | ICD-10-CM | POA: Diagnosis present

## 2022-09-05 DIAGNOSIS — G928 Other toxic encephalopathy: Secondary | ICD-10-CM | POA: Diagnosis present

## 2022-09-05 DIAGNOSIS — K5733 Diverticulitis of large intestine without perforation or abscess with bleeding: Secondary | ICD-10-CM | POA: Diagnosis present

## 2022-09-05 DIAGNOSIS — R197 Diarrhea, unspecified: Secondary | ICD-10-CM | POA: Diagnosis not present

## 2022-09-05 DIAGNOSIS — Y830 Surgical operation with transplant of whole organ as the cause of abnormal reaction of the patient, or of later complication, without mention of misadventure at the time of the procedure: Secondary | ICD-10-CM | POA: Diagnosis present

## 2022-09-05 DIAGNOSIS — N179 Acute kidney failure, unspecified: Secondary | ICD-10-CM | POA: Diagnosis present

## 2022-09-05 DIAGNOSIS — U071 COVID-19: Secondary | ICD-10-CM | POA: Diagnosis present

## 2022-09-05 DIAGNOSIS — E860 Dehydration: Secondary | ICD-10-CM | POA: Diagnosis not present

## 2022-09-05 DIAGNOSIS — R4182 Altered mental status, unspecified: Secondary | ICD-10-CM | POA: Diagnosis not present

## 2022-09-05 DIAGNOSIS — N1330 Unspecified hydronephrosis: Secondary | ICD-10-CM | POA: Diagnosis not present

## 2022-09-05 DIAGNOSIS — N186 End stage renal disease: Secondary | ICD-10-CM | POA: Diagnosis present

## 2022-09-05 DIAGNOSIS — Z789 Other specified health status: Secondary | ICD-10-CM | POA: Diagnosis not present

## 2022-09-05 DIAGNOSIS — Z66 Do not resuscitate: Secondary | ICD-10-CM | POA: Diagnosis not present

## 2022-09-05 DIAGNOSIS — D84821 Immunodeficiency due to drugs: Secondary | ICD-10-CM | POA: Diagnosis present

## 2022-09-05 DIAGNOSIS — Z992 Dependence on renal dialysis: Secondary | ICD-10-CM | POA: Diagnosis not present

## 2022-09-05 DIAGNOSIS — E1122 Type 2 diabetes mellitus with diabetic chronic kidney disease: Secondary | ICD-10-CM | POA: Diagnosis present

## 2022-09-05 DIAGNOSIS — J1282 Pneumonia due to coronavirus disease 2019: Secondary | ICD-10-CM | POA: Diagnosis present

## 2022-09-05 DIAGNOSIS — K5730 Diverticulosis of large intestine without perforation or abscess without bleeding: Secondary | ICD-10-CM | POA: Diagnosis not present

## 2022-09-05 DIAGNOSIS — D631 Anemia in chronic kidney disease: Secondary | ICD-10-CM | POA: Diagnosis present

## 2022-09-05 DIAGNOSIS — D509 Iron deficiency anemia, unspecified: Secondary | ICD-10-CM | POA: Diagnosis not present

## 2022-09-05 DIAGNOSIS — N184 Chronic kidney disease, stage 4 (severe): Secondary | ICD-10-CM

## 2022-09-05 DIAGNOSIS — R609 Edema, unspecified: Secondary | ICD-10-CM | POA: Diagnosis not present

## 2022-09-05 DIAGNOSIS — D696 Thrombocytopenia, unspecified: Secondary | ICD-10-CM | POA: Diagnosis present

## 2022-09-05 DIAGNOSIS — I82451 Acute embolism and thrombosis of right peroneal vein: Secondary | ICD-10-CM | POA: Diagnosis present

## 2022-09-05 DIAGNOSIS — G9340 Encephalopathy, unspecified: Secondary | ICD-10-CM | POA: Diagnosis not present

## 2022-09-05 DIAGNOSIS — D490 Neoplasm of unspecified behavior of digestive system: Secondary | ICD-10-CM | POA: Diagnosis not present

## 2022-09-05 DIAGNOSIS — J9601 Acute respiratory failure with hypoxia: Secondary | ICD-10-CM | POA: Diagnosis not present

## 2022-09-05 DIAGNOSIS — I4819 Other persistent atrial fibrillation: Secondary | ICD-10-CM | POA: Diagnosis present

## 2022-09-05 DIAGNOSIS — K625 Hemorrhage of anus and rectum: Secondary | ICD-10-CM | POA: Diagnosis not present

## 2022-09-05 DIAGNOSIS — I120 Hypertensive chronic kidney disease with stage 5 chronic kidney disease or end stage renal disease: Secondary | ICD-10-CM | POA: Diagnosis present

## 2022-09-05 DIAGNOSIS — I82431 Acute embolism and thrombosis of right popliteal vein: Secondary | ICD-10-CM | POA: Diagnosis present

## 2022-09-05 DIAGNOSIS — R0602 Shortness of breath: Secondary | ICD-10-CM | POA: Diagnosis not present

## 2022-09-05 DIAGNOSIS — K921 Melena: Secondary | ICD-10-CM | POA: Diagnosis not present

## 2022-09-05 DIAGNOSIS — Q438 Other specified congenital malformations of intestine: Secondary | ICD-10-CM | POA: Diagnosis not present

## 2022-09-05 DIAGNOSIS — I82411 Acute embolism and thrombosis of right femoral vein: Secondary | ICD-10-CM | POA: Diagnosis present

## 2022-09-05 DIAGNOSIS — E871 Hypo-osmolality and hyponatremia: Secondary | ICD-10-CM | POA: Diagnosis present

## 2022-09-05 DIAGNOSIS — I82401 Acute embolism and thrombosis of unspecified deep veins of right lower extremity: Secondary | ICD-10-CM | POA: Diagnosis not present

## 2022-09-05 DIAGNOSIS — K5731 Diverticulosis of large intestine without perforation or abscess with bleeding: Secondary | ICD-10-CM | POA: Diagnosis present

## 2022-09-05 DIAGNOSIS — T8619 Other complication of kidney transplant: Secondary | ICD-10-CM | POA: Diagnosis present

## 2022-09-05 DIAGNOSIS — Z515 Encounter for palliative care: Secondary | ICD-10-CM | POA: Diagnosis not present

## 2022-09-05 LAB — RENAL FUNCTION PANEL
Albumin: 2.9 g/dL — ABNORMAL LOW (ref 3.5–5.0)
Anion gap: 14 (ref 5–15)
BUN: 42 mg/dL — ABNORMAL HIGH (ref 8–23)
CO2: 23 mmol/L (ref 22–32)
Calcium: 8.4 mg/dL — ABNORMAL LOW (ref 8.9–10.3)
Chloride: 99 mmol/L (ref 98–111)
Creatinine, Ser: 2.36 mg/dL — ABNORMAL HIGH (ref 0.61–1.24)
GFR, Estimated: 28 mL/min — ABNORMAL LOW (ref >60)
Glucose, Bld: 91 mg/dL (ref 70–99)
Phosphorus: 2.5 mg/dL (ref 2.5–4.6)
Potassium: 3.2 mmol/L — ABNORMAL LOW (ref 3.5–5.1)
Sodium: 136 mmol/L (ref 135–145)

## 2022-09-05 LAB — IRON AND TIBC
Iron: 43 ug/dL — ABNORMAL LOW (ref 45–182)
Saturation Ratios: 31 % (ref 17.9–39.5)
TIBC: 139 ug/dL — ABNORMAL LOW (ref 250–450)
UIBC: 96 ug/dL

## 2022-09-05 LAB — CBC
HCT: 27.5 % — ABNORMAL LOW (ref 39.0–52.0)
Hemoglobin: 8.7 g/dL — ABNORMAL LOW (ref 13.0–17.0)
MCH: 27.6 pg (ref 26.0–34.0)
MCHC: 31.6 g/dL (ref 30.0–36.0)
MCV: 87.3 fL (ref 80.0–100.0)
Platelets: 151 10*3/uL (ref 150–400)
RBC: 3.15 MIL/uL — ABNORMAL LOW (ref 4.22–5.81)
RDW: 18.5 % — ABNORMAL HIGH (ref 11.5–15.5)
WBC: 5.2 10*3/uL (ref 4.0–10.5)
nRBC: 0 % (ref 0.0–0.2)

## 2022-09-05 LAB — MAGNESIUM: Magnesium: 1.6 mg/dL — ABNORMAL LOW (ref 1.7–2.4)

## 2022-09-05 LAB — GLUCOSE, CAPILLARY
Glucose-Capillary: 89 mg/dL (ref 70–99)
Glucose-Capillary: 92 mg/dL (ref 70–99)
Glucose-Capillary: 103 mg/dL — ABNORMAL HIGH (ref 70–99)

## 2022-09-05 LAB — ECHOCARDIOGRAM COMPLETE
Area-P 1/2: 2.64 cm2
Calc EF: 63.3 %
Height: 69 in
S' Lateral: 2.8 cm
Single Plane A2C EF: 64.7 %
Single Plane A4C EF: 62.9 %
Weight: 3680 oz

## 2022-09-05 LAB — HEMOGLOBIN A1C
Hgb A1c MFr Bld: 6.1 % — ABNORMAL HIGH (ref 4.8–5.6)
Mean Plasma Glucose: 128.37 mg/dL

## 2022-09-05 LAB — FERRITIN: Ferritin: 2123 ng/mL — ABNORMAL HIGH (ref 24–336)

## 2022-09-05 LAB — PHOSPHORUS: Phosphorus: 2.5 mg/dL (ref 2.5–4.6)

## 2022-09-05 MED ORDER — LACTATED RINGERS IV SOLN: INTRAVENOUS | Status: AC

## 2022-09-05 MED ORDER — CHLORHEXIDINE GLUCONATE CLOTH 2 % EX PADS
6.0000 | MEDICATED_PAD | Freq: Every day | CUTANEOUS | Status: DC
  Administered 2022-09-05 – 2022-09-15 (×11): 6 via TOPICAL

## 2022-09-05 MED ORDER — PREDNISONE 5 MG PO TABS
5.0000 mg | ORAL_TABLET | Freq: Every day | ORAL | Status: DC
  Administered 2022-09-05 – 2022-09-14 (×9): 5 mg via ORAL
  Filled 2022-09-05 (×9): qty 1

## 2022-09-05 MED ORDER — POTASSIUM CHLORIDE 10 MEQ/100ML IV SOLN
10.0000 meq | INTRAVENOUS | Status: AC
  Administered 2022-09-05 (×4): 10 meq via INTRAVENOUS
  Filled 2022-09-05 (×4): qty 100

## 2022-09-05 MED ORDER — CARVEDILOL 25 MG PO TABS: 25.0000 mg | ORAL_TABLET | Freq: Two times a day (BID) | ORAL | Status: DC

## 2022-09-05 MED ORDER — MAGNESIUM SULFATE 2 GM/50ML IV SOLN
2.0000 g | Freq: Once | INTRAVENOUS | Status: AC
  Administered 2022-09-05: 2 g via INTRAVENOUS
  Filled 2022-09-05: qty 50

## 2022-09-05 MED ORDER — SULFAMETHOXAZOLE-TRIMETHOPRIM 400-80 MG PO TABS
1.0000 | ORAL_TABLET | ORAL | Status: DC
  Administered 2022-09-05 – 2022-09-14 (×4): 1 via ORAL
  Filled 2022-09-05 (×5): qty 1

## 2022-09-05 MED ORDER — CARVEDILOL 12.5 MG PO TABS
12.5000 mg | ORAL_TABLET | Freq: Two times a day (BID) | ORAL | Status: DC
  Administered 2022-09-05 – 2022-09-07 (×4): 12.5 mg via ORAL
  Filled 2022-09-05 (×4): qty 1

## 2022-09-05 MED ORDER — APIXABAN 5 MG PO TABS
5.0000 mg | ORAL_TABLET | Freq: Two times a day (BID) | ORAL | Status: DC
  Administered 2022-09-05 – 2022-09-07 (×4): 5 mg via ORAL
  Filled 2022-09-05 (×4): qty 1

## 2022-09-05 MED ORDER — MYCOPHENOLATE SODIUM 180 MG PO TBEC
180.0000 mg | DELAYED_RELEASE_TABLET | Freq: Two times a day (BID) | ORAL | Status: DC
  Administered 2022-09-05 – 2022-09-14 (×18): 180 mg via ORAL
  Filled 2022-09-05 (×21): qty 1

## 2022-09-05 NOTE — Progress Notes (Signed)
ANTICOAGULATION CONSULT NOTE - Initial Consult  Pharmacy Consult for apixaban dosing and continued follow-up. Indication: atrial fibrillation  No Known Allergies  Patient Measurements: Height: 5' 9"$  (175.3 cm) Weight: 104.3 kg (230 lb) IBW/kg (Calculated) : 70.7  Vital Signs: Temp: 98.4 F (36.9 C) (02/14 1053) Temp Source: Oral (02/14 1053) BP: 108/72 (02/14 1053) Pulse Rate: 99 (02/14 1053)  Labs: Recent Labs    09/03/2022 0851 09/19/2022 0919 09/05/22 0035 09/05/22 0036  HGB 9.3*  --  8.7*  --   HCT 30.1*  --  27.5*  --   PLT 158  --  151  --   CREATININE  --  2.74*  --  2.36*    Estimated Creatinine Clearance: 33.2 mL/min (A) (by C-G formula based on SCr of 2.36 mg/dL (H)).   Medical History: Past Medical History:  Diagnosis Date   Cancer [facility]) 2017   prostate cancer   Chronic kidney disease    Dialysis Tues-Thurs.[facility] [place]   Constipation    Diabetes mellitus ([place])    Headache    occ   History of kidney transplant    Hypertension    Neck pain    pulled muscle in neck on cyclobenzaprine prn    Medications:  Scheduled:   apixaban  5 mg Oral BID   carvedilol  12.5 mg Oral BID WC   feeding supplement  237 mL Oral BID BM   guaiFENesin  600 mg Oral BID   insulin aspart  0-15 Units Subcutaneous TID WC   mycophenolate  180 mg Oral BID   predniSONE  5 mg Oral Q breakfast   sulfamethoxazole-trimethoprim  1 tablet Oral Q M,W,F    Assessment: Per request of the cardiology physician assistant, a review of mycopohenolate + apixaban + SMX-TMP were evaluated in two separate DDI databases and revealed no drug-drug-interactions detected. Review of apixaban dosing using the package labeling focusing on "Age, Body Habitus and SCr" indicates that the 65m PO BID dosing at present time is appropriate.  Goal of Therapy:  Will follow clinically the patient's renal function to make any dose adjustments if necessary .    Plan: Apixaban, 579mPO BID--at  1700/0500 hours.   JaPennie BanterPharmD, CPP 09/05/2022,1:17 PM

## 2022-09-05 NOTE — Evaluation (Signed)
Occupational Therapy Evaluation Patient Details Name: Andrew Brandt MRN: [id number] DOB: 1950-10-04 Today's Date: 09/05/2022   History of Present Illness Andrew Brandt is a 72 year old who presents via EMS for generalized weakness, diarrhea and poor appetite; with a history of ESRD s/p renal transplant on immunosuppression, prostate cancer s/p prostatectomy, T2DM and hypertension   Clinical Impression   Pt walks without AD, completes basic ADLs independently and light meal prep. Pt drives. He lives with his long time girlfriend on whom he relies for housekeeping and groceries. He endorses a sedentary lifestyle. Pt presents with generalized weakness, impaired problem solving, decreased standing balance and orthostatic hypotension (see flow sheet for specifics) with pt stating he did feel slightly light headed with second standing BP. Recommending HHOT.      Recommendations for follow up therapy are one component of a multi-disciplinary discharge planning process, led by the attending physician.  Recommendations may be updated based on patient status, additional functional criteria and insurance authorization.   Follow Up Recommendations  Home health OT     Assistance Recommended at Discharge Frequent or constant Supervision/Assistance  Patient can return home with the following A little help with walking and/or transfers;A lot of help with bathing/dressing/bathroom;Assistance with cooking/housework;Assist for transportation;Help with stairs or ramp for entrance    Functional Status Assessment  Patient has had a recent decline in their functional status and demonstrates the ability to make significant improvements in function in a reasonable and predictable amount of time.  Equipment Recommendations  Tub/shower seat    Recommendations for Other Services       Precautions / Restrictions Precautions Precautions: Fall Precaution Comments: orthostatic hypotension Restrictions Weight  Bearing Restrictions: No      Mobility Bed Mobility Overal bed mobility: Needs Assistance Bed Mobility: Supine to Sit     Supine to sit: Min assist     General bed mobility comments: assist to raise trunk    Transfers Overall transfer level: Needs assistance   Transfers: Sit to/from Stand Sit to Stand: Min assist           General transfer comment: steadying assist      Balance Overall balance assessment: Needs assistance   Sitting balance-Leahy Scale: Fair       Standing balance-Leahy Scale: Fair Standing balance comment: min guard                           ADL either performed or assessed with clinical judgement   ADL Overall ADL's : Needs assistance/impaired Eating/Feeding: Set up;Sitting   Grooming: Set up;Sitting   Upper Body Bathing: Moderate assistance;Sitting   Lower Body Bathing: Maximal assistance;Sit to/from stand   Upper Body Dressing : Minimal assistance;Sitting   Lower Body Dressing: Maximal assistance;Sit to/from stand   Toilet Transfer: Min Designer, jewellery Details (indicate cue type and reason): simulated to chair Toileting- Clothing Manipulation and Hygiene: Maximal assistance;Sit to/from stand       Functional mobility during ADLs: Min guard       Vision Baseline Vision/History: 1 Wears glasses Ability to See in Adequate Light: 0 Adequate       Perception     Praxis      Pertinent Vitals/Pain       Hand Dominance Right   Extremity/Trunk Assessment Upper Extremity Assessment Upper Extremity Assessment: Generalized weakness   Lower Extremity Assessment Lower Extremity Assessment: Defer to PT evaluation   Cervical / Trunk Assessment Cervical / Trunk Assessment: Other exceptions (  obese, forward head)   Communication Communication Communication: No difficulties   Cognition Arousal/Alertness: Awake/alert Behavior During Therapy: Flat affect Overall Cognitive Status:  Impaired/Different from baseline Area of Impairment: Problem solving                             Problem Solving: Decreased initiation General Comments: Pt aware he was soiled with urine and  BM, but did not call for assist. Per girlfriend, pt is stubborn and resistant to coming to the hospital as he got weaker.     General Comments       Exercises     Shoulder Instructions      Home Living Family/patient expects to be discharged to:: Private residence Living Arrangements: Spouse/significant other (girlfriend) Available Help at Discharge: Friend(s);Available PRN/intermittently (until Monday) Type of Home: House Home Access: Stairs to enter [employer] of Steps: 1   Home Layout: One level     Bathroom Shower/Tub: Occupational psychologist: Handicapped height     Home Equipment: Shower seat - built in;Hand held shower head          Prior Functioning/Environment Prior Level of Function : Needs assist;Driving             Mobility Comments: walks without AD, sedentary ADLs Comments: independent in self care, light meal prep, girlfriend does housekeeping and grocery shopping        OT Problem List: Decreased strength;Impaired balance (sitting and/or standing);Decreased activity tolerance;Decreased cognition;Decreased safety awareness;Decreased knowledge of use of DME or AE      OT Treatment/Interventions: Self-care/ADL training;DME and/or AE instruction;Therapeutic activities;Cognitive remediation/compensation;Patient/family education;Balance training    OT Goals(Current goals can be found in the care plan section) Acute Rehab OT Goals OT Goal Formulation: With patient/family Time For Goal Achievement: 09/19/22 Potential to Achieve Goals: Good ADL Goals Pt Will Perform Grooming: with supervision;standing Pt Will Perform Lower Body Bathing: with supervision;sit to/from stand Pt Will Perform Lower Body Dressing: with supervision;sit  to/from stand Pt Will Transfer to Toilet: with supervision;ambulating;bedside commode Pt Will Perform Toileting - Clothing Manipulation and hygiene: with supervision;sit to/from stand Pt Will Perform Tub/Shower Transfer: Shower transfer;with supervision;ambulating;shower seat;3 in 1  OT Frequency: Min 2X/week    Co-evaluation              AM-PAC OT "6 Clicks" Daily Activity     Outcome Measure Help from another person eating meals?: None Help from another person taking care of personal grooming?: A Little Help from another person toileting, which includes using toliet, bedpan, or urinal?: A Lot Help from another person bathing (including washing, rinsing, drying)?: A Lot Help from another person to put on and taking off regular upper body clothing?: A Little Help from another person to put on and taking off regular lower body clothing?: A Lot 6 Click Score: 16   End of Session Nurse Communication: Mobility status;Other (comment) (aware pt has orthostatic hypotension)  Activity Tolerance: Treatment limited secondary to medical complications (Comment) (orthostatic hypotension) Patient left: in chair;with call bell/phone within reach;with nursing/sitter in room  OT Visit Diagnosis: Unsteadiness on feet (R26.81);Muscle weakness (generalized) (M62.81)                Time: [id number] OT Time Calculation (min): 36 min Charges:  OT General Charges $OT Visit: 1 Visit OT Evaluation $OT Eval Moderate Complexity: [place], OTR/L Acute Rehabilitation Services Office: [phone]  Malka So 09/05/2022, 1:00 PM

## 2022-09-05 NOTE — Progress Notes (Signed)
Echocardiogram 2D Echocardiogram has been performed.  Andrew Brandt 09/05/2022, 3:54 PM

## 2022-09-05 NOTE — Progress Notes (Signed)
Internal Medicine Attending:   I saw and examined the patient. I reviewed the resident's H&P note and I agree with the resident's findings and plan as documented in the resident's note.  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  Patient's wife was also sick approximately 2 weeks ago with a URI.  Patient does report decreased appetite and decreased oral intake as well.  Today, patient still complains of generalized weakness and fatigue but does feel slightly better.  Denies any new episodes of diarrhea since admission.  Vitals:   09/05/22 0900 09/05/22 1053  BP:  108/72  Pulse:  99  Resp: 18   Temp:  98.4 F (36.9 C)  SpO2:  97%    On exam, patient is lying in bed in no apparent distress.  Lungs are clear to auscultation bilaterally.  Cardiovascular exam reveals irregularly irregular rhythm but normal rate.  Abdomen is soft, nontender, send with normoactive bowel sounds.  Lower extremities are nontender to palpation with no edema noted.  Right upper extremity aVF has a palpable thrill.  Patient is oriented x 3 with normal mood and affect.  Assessment and plan:  1.  Generalized weakness in the setting of COVID-19 infection: -Patient presented to ED with rectal cough over the last week with associated diarrhea and decreased oral intake with an AKI on lab work and was found to be COVID-positive on admission. -I suspect that the patient's symptoms are primarily related to his COVID-19 infection.  He has no evidence of hypoxia and is out of the window for treatment with Paxlovid. -Patient was noted to have an AKI with a creatinine up to 2.74 up from his baseline of 2.2-2.3 in the setting of  a history of ESRD status post renal transplant.  Patient's creatinine has improved now to 2.3 which appears to be his baseline.  Will continue with IV hydration for now while patient still has poor oral intake.  If patient's appetite improves would DC IVF -PT/OT to evaluate patient -Continue with immunosuppressive regimen for his renal transplant including mycophenolate 180 mg twice daily/prednisone 5 mg daily as well as Bactrim prophylaxis for PCP -No further workup at this time  2.  New onset A-fib with RVR: -Patient was noted to have new onset A-fib with RVR on his EKG on admission.  Patient's heart rate is currently well-controlled. -I suspect his new onset A-fib was precipitated by his COVID-19 infection -We will repeat his EKG today -Will resume patient's Coreg 25 mg twice daily and monitor blood pressure closely -Patient's CHA2DS2-VASc score is 3 and he should be started on anticoagulation.  Will consider starting Eliquis today -Cardiology to consult with patient -No further workup at this time

## 2022-09-05 NOTE — Consult Note (Addendum)
Cardiology Consultation   Patient ID: Andrew Brandt MRN: [id number]; DOB: Nov 25, 1950  Admit date: 08/25/2022 Date of Consult: 09/05/2022  PCP:  Benito Mccreedy, MD   [place] Providers Cardiologist:  None        Patient Profile:   Andrew Brandt is a 72 y.o. male with a hx of ESRD s/p subsequent kidney transplant 01/2019 followed at [facility] (subsequent CKD stage IV with Cr 2.71 in 07/2022) on chronic immunosuppressants, prostate CA s/p prostatectomy 2016, HTN, DM, prior anemia on labs who is being seen 09/05/2022 for the evaluation of atrial fib at the request of Dr. Lysbeth Galas.  History of Present Illness:   Andrew Brandt has no prior cardiac history. He does have prior testing in [place]. His girlfriend indicates prior kidney transplant in 2014 though [facility] records suggest this was in 01/2019. He had pre-transplant stress echo 09/2017 which was negative for ischemia. Last echo 04/2019 EF 55-60%, mild LVH, mild BAE, dilated IVC.   About 2 weeks ago he came down with cold symptoms and cough so was taking Robitussin with some improvement. However, for the past week he has had watery stools and very little appetite with poor oral intake - basically having a sip of fluid a day, not eating at all. His cough also began to return the last 5-7 days. His girlfriend planned to take him to the doctor but yesterday was unable to get him off the commode due to progressive weakness so EMS was called. Labs showed hyponatremia of 133, Cr 2.84, Hgb 9.3->8.7, UA with ketonuria, Covid positive. CXR showed enlarged cardiac silhouette but no focal consolidation. He was noted to be in AF with mildly elevated rates, new dx for patient. He has been treated with IV fluids. His carvedilol was withheld and written to resume this PM. He was felt to be out of the window for antiviral therapies. He is not hypoxic. Orthostatics were checked this AM demonstrating:  Lying 118/65, pulse 98 Sitting 125/65,  pulse 105 Standing 69m99/66, pulse 112 Standing 39m1/63, pulse 96  He is unaware of his atrial fib and reports he is feeling much better. Denies any cardiac awareness of AF. No CP, SOB, palpitations, syncope. Has mild chronic appearing edema on exam. No orthopnea reported. He has mild asymmetry of his mouth noted which girlfriend states is chronic and possibly related to the layout of his teeth - denies prior CVA.  Past Medical History:  Diagnosis Date   [facility]   [facility]   Chronic kidney disease    [place]    [place]   Headache    occ   History of kidney transplant    Hypertension    Neck pain    pulled muscle in neck on cyclobenzaprine prn    Past Surgical History:  Procedure Laterality Date   AV FISTULA PLACEMENT Left 11/23/2013   Procedure: ARTERIOVENOUS (AV) FISTULA CREATION VS. GRAFT;  Surgeon: ToRosetta PosnerMD;  [facility];  Service: Vascular;  Laterality: Left;   AV FISTULA PLACEMENT Left 07/12/2017   Procedure: INSERTION OF ARTERIOVENOUS (AV) GORE-TEX GRAFT ARM LEFT UPPER ARM;  Surgeon: FiElam DutchMD;  [facility];  Service: Vascular;  Laterality: Left;   AV FISTULA PLACEMENT Right 10/21/2017   Procedure: ARTERIOVENOUS ARM (AV) FISTULA CREATION;  Surgeon: FiElam DutchMD;  [facility];  Service: Vascular;  Laterality: Right;   COLONOSCOPY  WITH PROPOFOL N/A 03/25/2015   Procedure: COLONOSCOPY WITH PROPOFOL;  Surgeon: Carol Ada, MD;  Location: [facility];  Service: Endoscopy;  Laterality: N/A;   COLONOSCOPY WITH PROPOFOL N/A 05/09/2018   Procedure: COLONOSCOPY WITH PROPOFOL;  Surgeon: Carol Ada, MD;  Location: [facility]OS[facility];  Service: Endoscopy;  Laterality: N/A;  aborted due to prep   INSERTION OF DIALYSIS CATHETER N/A 11/23/2013   Procedure: INSERTION OF DIALYSIS CATHETER RIGHT INTERNAL JUGULAR VEIN;  Surgeon: Rosetta Posner, MD;  Location: [place];   Service: Vascular;  Laterality: N/A;   PROSTATECTOMY     THROMBECTOMY AND REVISION OF ARTERIOVENTOUS (AV) GORETEX  GRAFT Left 09/02/2017   Procedure: THROMBECTOMY  OF Left Arm  ARTERIOVENTOUS (AV) GORETEX  GRAFT;  Surgeon: Angelia Mould, MD;  Location: [facility];  Service: Vascular;  Laterality: Left;     Home Medications:  Prior to Admission medications   Medication Sig Start Date End Date Taking? Authorizing Provider  acetaminophen (TYLENOL) 500 MG tablet Take 1,000 mg by mouth every 6 (six) hours as needed for mild pain or headache.    Yes [provider]  aspirin EC 81 MG tablet Take 81 mg by mouth daily. Swallow whole.   Yes [provider]  calcitRIOL (ROCALTROL) 0.25 MCG capsule Take 0.25 mcg by mouth 3 (three) times a week. M,W, F   Yes [provider]  carvedilol (COREG) 25 MG tablet Take 25 mg by mouth 2 (two) times daily.   Yes [provider]  Cholecalciferol (VITAMIN D) 125 MCG (5000 UT) CAPS Take 5,000 Units by mouth daily.   Yes [provider]  furosemide (LASIX) 40 MG tablet Take 40 mg by mouth 2 (two) times daily. 05/17/22  Yes [provider]  guaiFENesin-dextromethorphan (ROBITUSSIN DM) 100-10 MG/5ML syrup Take 10 mLs by mouth every 4 (four) hours as needed for cough.   Yes [provider]  metFORMIN (GLUCOPHAGE) 500 MG tablet Take 500 mg by mouth 2 (two) times daily.   Yes [provider]  mycophenolate (MYFORTIC) 180 MG EC tablet Take 180 mg by mouth 2 (two) times daily. 04/30/22  Yes [provider]  omeprazole (PRILOSEC) 20 MG capsule Take 20 mg by mouth daily. 07/10/22  Yes [provider]  predniSONE (DELTASONE) 5 MG tablet Take 5 mg by mouth daily with breakfast.   Yes [provider]  sulfamethoxazole-trimethoprim (BACTRIM) 400-80 MG tablet Take 1 tablet by mouth 3 (three) times a week. M,W,F 05/14/22  Yes [provider]    Inpatient  Medications: Scheduled Meds:  carvedilol  25 mg Oral BID WC   enoxaparin (LOVENOX) injection  30 mg Subcutaneous Q24H   feeding supplement  237 mL Oral BID BM   guaiFENesin  600 mg Oral BID   insulin aspart  0-15 Units Subcutaneous TID WC   mycophenolate  180 mg Oral BID   predniSONE  5 mg Oral Q breakfast   sulfamethoxazole-trimethoprim  1 tablet Oral Q M,W,F   Continuous Infusions:  lactated ringers     PRN Meds: acetaminophen **OR** acetaminophen, ondansetron **OR** ondansetron (ZOFRAN) IV, senna-docusate  Allergies:   No Known Allergies  Social History:   Social History   Socioeconomic History   Marital status: Divorced    Spouse name: Not on file   Number of children: Not on file   Years of education: Not on file   Highest education level: Not on file  Occupational History   Not on file  Tobacco Use  Smoking status: Former    Types: Cigarettes   Smokeless tobacco: Never   Tobacco comments:    quit smoking cigaretees > 40 years  Vaping Use   Vaping Use: Never used  Substance and Sexual Activity   Alcohol use: Yes    Comment: Occ. 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  All other ROS reviewed and negative.     Physical Exam/Data:   Vitals:   08/27/2022 2007 09/05/22 0514 09/05/22 0900 09/05/22 1053   BP: 120/74 131/70  108/72  Pulse: 99 (!) 101  99  Resp: 16 18 18   $ Temp: 98.6 F (37 C) 98.1 F (36.7 C)  98.4 F (36.9 C)  TempSrc: Oral Oral  Oral  SpO2: 100% 96%  97%  Weight:      Height:        Intake/Output Summary (Last 24 hours) at 09/05/2022 1223 Last data filed at 09/05/2022 0800 Gross per 24 hour  Intake 2040.21 ml  Output 375 ml  Net 1665.21 ml      09/16/2022    8:47 AM 04/30/2022    8:23 AM 05/09/2018   11:05 AM  Last 3 Weights  Weight (lbs) 230 lb 257 lb 8 oz 199 lb 15.3 oz  Weight (kg) 104.327 kg 116.801 kg 90.7 kg     Body mass index is 33.97 kg/m.  General: Well developed, well nourished, in no acute distress. Head: Normocephalic, atraumatic, sclera non-icteric, no xanthomas, nares are without discharge. He has mild asymmetry of his mouth noted which girlfriend states is chronic and possibly related to the layout of his teeth Neck: Negative for carotid bruits. JVP not elevated. Lungs: Diminished throughout without wheezes, rales, or rhonchi. Breathing is unlabored. Heart: irregularly irregular, S1 S2 without murmurs, rubs, or gallops.  Abdomen: Soft, non-tender, non-distended with normoactive bowel sounds. No rebound/guarding. Extremities: No clubbing or cyanosis. Trace soft puffy BLE ankle edema. Distal pedal pulses are 2+ and equal bilaterally. Neuro: Alert and oriented X 3. Moves all extremities spontaneously. Psych:  Responds to questions appropriately with a normal affect.   EKG:  The EKG was personally reviewed and demonstrates:  afib 113bpm, possible prior inferiro infarct, no acute ST changes. Initial EKG also suggestive of afib with baseline artifact. Last EKG 2018 was sinus rhythm. Telemetry:  Telemetry was personally reviewed and demonstrates:  afib  Relevant CV Studies: See care everywhere and HPI  Laboratory Data:  High Sensitivity Troponin:  No results for input(s): "TROPONINIHS" in the last 720 hours.   Chemistry Recent Labs  Lab  09/01/2022 0919 09/05/22 0035 09/05/22 0036  NA 133*  --  136  K 4.5  --  3.2*  CL 92*  --  99  CO2 24  --  23  GLUCOSE 135*  --  91  BUN 48*  --  42*  CREATININE 2.74*  --  2.36*  CALCIUM 8.7*  --  8.4*  MG  --  1.6*  --   GFRNONAA 24*  --  28*  ANIONGAP 17*  --  14    Recent Labs  Lab 09/13/2022 0919 09/05/22 0036  PROT 6.3*  --   ALBUMIN 3.2* 2.9*  AST 21  --   ALT 9  --   ALKPHOS 37*  --   BILITOT 1.4*  --    Lipids No results for input(s): "CHOL", "TRIG", "HDL", "LABVLDL", "LDLCALC", "CHOLHDL" in the last 168 hours.  Hematology Recent Labs  Lab 08/23/2022 0851 09/05/22 0035  WBC 5.8 5.2  RBC 3.39* 3.15*  HGB 9.3* 8.7*  HCT 30.1* 27.5*  MCV 88.8 87.3  MCH 27.4 27.6  MCHC 30.9 31.6  RDW 18.6* 18.5*  PLT 158 151   Thyroid No results for input(s): "TSH", "FREET4" in the last 168 hours.  BNPNo results for input(s): "BNP", "PROBNP" in the last 168 hours.  DDimer No results for input(s): "DDIMER" in the last 168 hours.   Radiology/Studies:  DG Chest Port 1 View  Result Date: 09/13/2022 CLINICAL DATA:  Cough EXAM: PORTABLE CHEST 1 VIEW COMPARISON:  11/23/2013 FINDINGS: Cardiac silhouette appears prominent. No pneumonia or pulmonary edema. No pneumothorax or pleural effusion. Aorta is calcified. There are thoracic degenerative changes. IMPRESSION: Enlarged cardiac silhouette.  No focal consolidation. Electronically Signed   By: Sammie Bench M.D.   On: 08/29/2022 10:36     Assessment and Plan:   1. Newly recognized atrial fibrillation - chronicity unclear but may be provoked by recent acute medical illness - with continued orthostasis on exam this AM, we will reduce carvedilol dose to 12.8m BID - since not anticoagulated, do not anticipate urgent DCCV - would avoid digoxin with renal disease - add TSH with AM labs - will check echocardiogram - given CHADSVASC 3 would warrant anticoagulation - have a msg into pharmD to review DOAC with transplant meds to ensure  safest option, they will discuss and get back with [place]- will order apixaban per pharmacy consult- would suggest Eliquis 538mBID if appropriate - addendum: no DDI identified, OK to start Eliquis - will give first dose at 5pm today per d/w Dr. CrSallyanne Kustero help with dosing timeline (5p/5a rather than 1p/1a)  2. Mild LE edema - clinically dry on presentation to the hospital - may be related to decreased albumin level, no indication for diuresis today - plan echocardiogram as above  3. Hypokalemia/hypomagnesemia - primary team managing  Remainder of medical issues per primary team: - Covid-19 infection with diarrhea - anemia similarly seen in 07/2022, no bleeding reported - ERSD s/p transplant with CKD stage IV - DM  Risk Assessment/Risk Scores:          CHA2DS2-VASc Score = 3   This indicates a 3.2% annual risk of stroke. The patient's score is based upon: CHF History: 0 HTN History: 1 Diabetes History: 1 Stroke History: 0 Vascular Disease History: 0 Age Score: 1 Gender Score: 0         For questions or updates, please contact [place] consult [email or web address] for contact info under    Signed, DaCharlie PitterPA-C  09/05/2022 12:23 PM  I have seen and examined the  patient along with Charlie Pitter, PA-C .  I have reviewed the chart, notes and new data.  I agree with PA/NP's note.  Key new complaints: Continues to have nonproductive cough, but GI symptoms have subsided.  Denies shortness of breath or chest discomfort.  He is completely unaware of palpitations Key examination changes: Irregular rhythm with heart rate around 100 bpm.  Normal heart sounds and no murmurs clear lungs.  Bilateral lower extremity edema with some wrinkling.  Does appear to be normal.  Vitiligo. Key new findings / data: ECG shows atrial fibrillation with borderline ventricular rate control.  He was in atrial fibrillation upon presentation and has been in it persistently since admission.   Mild hypokalemia and hypomagnesemia.  Chronic kidney disease stage IV, with creatinine slightly worse than previous baseline, improving trend.  PLAN:  He has persistent atrial fibrillation of unknown duration.  Unclear whether it is a unique episode related to acute viral infection or whether he may be having recurrent episodes of paroxysmal atrial fibrillation. Resume carvedilol for ventricular rate control.  Since his blood pressure has not really rebounded to baseline, will use half of his previous dose. He should receive oral anticoagulation until we have some degree of confidence that this may have been a single event (it statistically more likely that he has recurrent atrial fibrillation).  He is on chronic antirejection medications as well as Bactrim to suppress secondary infection.  Although there is some interaction between trimethoprim and direct oral anticoagulants, would probably still use Eliquis 5 mg twice daily (he has renal dysfunction, but age less than 9 and weight well over 150 pounds). Reevaluate him after he has healed from his viral infection and has been on oral anticoagulants for a minimum of 3 weeks.  Consider cardioversion at that time if he is still in atrial fibrillation. Check an echocardiogram while he is here.  If he has significant left atrial dilation, that would further reinforce the need for chronic anticoagulation. He reports that he is due for his monthly infusion of antirejection medication belatacept tomorrow, which [facility] would [facility] have at [facility].  Since is likely that he will be hospitalized a little longer, he should probably receive it while here.  Sanda Klein, MD, [place] [phone] 09/05/2022, 1:44 PM

## 2022-09-05 NOTE — Evaluation (Signed)
Physical Therapy Evaluation Patient Details Name: Andrew Brandt MRN: [id number] DOB: 10/05/1950 Today's Date: 09/05/2022  History of Present Illness  Mr. Andrew Brandt is a 72 year old who presents via EMS for generalized weakness, diarrhea and poor appetite; with a history of ESRD s/p renal transplant on immunosuppression, prostate cancer s/p prostatectomy, T2DM and hypertension  Clinical Impression   Pt admitted with above diagnosis. Lives at home with significant other, in a single-level home with 1 steps to enter; Prior to admission, pt was able to managing independently, no AD, drives, sedentary lifestyle; Presents to PT with generalized weakness, decr activity tolerance, lightheadedness in standing that corresponded with a BP drop;  Overall min assist to stand and take a few pivot steps to recliner; Anticipate good progress as he recovers; See vitals flow sheets for Orthostatic BPs; Pt currently with functional limitations due to the deficits listed below (see PT Problem List). Pt will benefit from skilled PT to increase their independence and safety with mobility to allow discharge to the venue listed below.          Recommendations for follow up therapy are one component of a multi-disciplinary discharge planning process, led by the attending physician.  Recommendations may be updated based on patient status, additional functional criteria and insurance authorization.  Follow Up Recommendations Home health PT      Assistance Recommended at Discharge Set up Supervision/Assistance  Patient can return home with the following  Help with stairs or ramp for entrance;Assistance with cooking/housework    Equipment Recommendations Rolling walker (2 wheels);BSC/3in1  Recommendations for Other Services       Functional Status Assessment Patient has had a recent decline in their functional status and demonstrates the ability to make significant improvements in function in a reasonable and  predictable amount of time.     Precautions / Restrictions Precautions Precautions: Fall Precaution Comments: orthostatic hypotension Restrictions Weight Bearing Restrictions: No      Mobility  Bed Mobility Overal bed mobility: Needs Assistance Bed Mobility: Supine to Sit     Supine to sit: Min assist     General bed mobility comments: assist to raise trunk    Transfers Overall transfer level: Needs assistance   Transfers: Sit to/from Stand Sit to Stand: Min assist           General transfer comment: steadying assist    Ambulation/Gait Ambulation/Gait assistance: Min assist Gait Distance (Feet): 3 Feet Assistive device: 1 person hand held assist Gait Pattern/deviations: Shuffle       General Gait Details: Took a few pivot steps bed to chair; reported dizziness with incr time standing and stepping  Stairs            Wheelchair Mobility    Modified Rankin (Stroke Patients Only)       Balance Overall balance assessment: Needs assistance   Sitting balance-Leahy Scale: Fair       Standing balance-Leahy Scale: Fair Standing balance comment: min guard                             Pertinent Vitals/Pain Pain Assessment Pain Assessment: No/denies pain    Home Living Family/patient expects to be discharged to:: Private residence Living Arrangements: Spouse/significant other (girlfriend) Available Help at Discharge: Friend(s);Available PRN/intermittently (until Monday) Type of Home: House Home Access: Stairs to enter   [employer] of Steps: 1   Home Layout: One level Home Equipment: Shower seat - built in;Hand held shower head  Prior Function Prior Level of Function : Needs assist;Driving             Mobility Comments: walks without AD, sedentary ADLs Comments: independent in self care, light meal prep, girlfriend does housekeeping and grocery shopping     Hand Dominance   Dominant Hand: Right     Extremity/Trunk Assessment   Upper Extremity Assessment Upper Extremity Assessment: Defer to OT evaluation    Lower Extremity Assessment Lower Extremity Assessment: Generalized weakness    Cervical / Trunk Assessment Cervical / Trunk Assessment: Other exceptions (obese, forward head)  Communication   Communication: No difficulties  Cognition Arousal/Alertness: Awake/alert Behavior During Therapy: Flat affect Overall Cognitive Status: Impaired/Different from baseline Area of Impairment: Problem solving                             Problem Solving: Decreased initiation General Comments: Pt aware he was soiled with urine and  BM, but did not call for assist. Per girlfriend, pt is stubborn and resistant to coming to the hospital as he got weaker.        General Comments General comments (skin integrity, edema, etc.): session conducted on room air and O2 sats stayed at or above 92%    Exercises     Assessment/Plan    PT Assessment Patient needs continued PT services  PT Problem List Decreased strength;Decreased activity tolerance;Decreased balance;Decreased mobility;Decreased coordination;Decreased knowledge of use of DME;Decreased safety awareness;Decreased knowledge of precautions;Cardiopulmonary status limiting activity       PT Treatment Interventions DME instruction;Gait training;Stair training;Functional mobility training;Therapeutic activities;Therapeutic exercise;Balance training;Patient/family education    PT Goals (Current goals can be found in the Care Plan section)  Acute Rehab PT Goals Patient Stated Goal: did not state; agreeable to getting OOB PT Goal Formulation: With patient Time For Goal Achievement: 09/19/22 Potential to Achieve Goals: Good    Frequency Min 3X/week     Co-evaluation               AM-PAC PT "6 Clicks" Mobility  Outcome Measure Help needed turning from your back to your side while in a flat bed without using  bedrails?: None Help needed moving from lying on your back to sitting on the side of a flat bed without using bedrails?: A Little Help needed moving to and from a bed to a chair (including a wheelchair)?: A Little Help needed standing up from a chair using your arms (e.g., wheelchair or bedside chair)?: A Little Help needed to walk in hospital room?: A Little Help needed climbing 3-5 steps with a railing? : A Lot 6 Click Score: 18    End of Session Equipment Utilized During Treatment: Gait belt Activity Tolerance: Patient tolerated treatment well Patient left: in chair;with call bell/phone within reach;with chair alarm set Nurse Communication: Mobility status PT Visit Diagnosis: Unsteadiness on feet (R26.81);Muscle weakness (generalized) (M62.81)    Time: [id number] PT Time Calculation (min) (ACUTE ONLY): 35 min   Charges:   PT Evaluation $PT Eval Moderate Complexity: [place], PT  Acute Rehabilitation Services Office [phone]   [place] 09/05/2022, 5:50 PM

## 2022-09-05 NOTE — Progress Notes (Signed)
Subjective:   Summary: Andrew Brandt is a 72 y.o. year old male currently admitted on the IMTS HD#0 for generalized weakness/diarrhea with COVID.  Overnight Events:  NAEON.  Concern for A-fib on initial EKG, telemetry.  Comfortable on room air.  He denies any dysuria, changes urinary frequency, hematuria.  He is still having some diarrhea.  Denies any fevers or chills.  Does not have any chest pain, palpitations or presyncope.  He had his renal transplant in 2014.  He still has an intact fistula which was placed before he had his transplant.  She had to switch immunosuppressive medications he is taking post transplant.  Other than hypertension, he has no cardiac history and has never previously been diagnosed with A-fib.  Objective:  Vital signs in last 24 hours: Vitals:   09/06/2022 1830 09/11/2022 1906 09/07/2022 2007 09/05/22 0514  BP: 102/76 132/65 120/74 131/70  Pulse: 91 92 99 (!) 101  Resp: 20 16 16 18  $ Temp:  98.2 F (36.8 C) 98.6 F (37 C) 98.1 F (36.7 C)  TempSrc:  Oral Oral Oral  SpO2: 100% 93% 100% 96%  Weight:      Height:       Supplemental O2: Room Air SpO2: 96 %   Physical Exam:  Constitutional: Ill-appearing, laying in bed, in no acute distress Cardiovascular: Irregular rhythm, nontachycardic no murmurs, rubs or gallops Pulmonary/Chest: normal work of breathing on room air, minimal expiratory wheezing and rhonchi diffusely. Abdominal: soft, non-tender, mildly distended Skin: warm and dry Extremities: upper/lower extremity pulses 2+, no lower extremity edema present  [facility] Weights   08/27/2022 0847  Weight: 104.3 kg     Intake/Output Summary (Last 24 hours) at 09/05/2022 [id number] Last data filed at 09/05/2022 0215 Gross per 24 hour  Intake 1800.21 ml  Output 375 ml  Net 1425.21 ml   Net IO Since Admission: 1,425.21 mL [09/05/22 0623]  Pertinent Labs:    Latest Ref Rng & Units 09/05/2022   12:35 AM 09/03/2022    8:51 AM 07/30/2018     7:47 PM  CBC  WBC 4.0 - 10.5 K/uL 5.2  5.8  5.1   Hemoglobin 13.0 - 17.0 g/dL 8.7  9.3  11.0   Hematocrit 39.0 - 52.0 % 27.5  30.1  35.3   Platelets 150 - 400 K/uL 151  158  140        Latest Ref Rng & Units 09/05/2022   12:36 AM 09/16/2022    9:19 AM 07/30/2018    7:47 PM  CMP  Glucose 70 - 99 mg/dL 91  135  83   BUN 8 - 23 mg/dL 42  48  25   Creatinine 0.61 - 1.24 mg/dL 2.36  2.74  8.73   Sodium 135 - 145 mmol/L 136  133  140   Potassium 3.5 - 5.1 mmol/L 3.2  4.5  2.9   Chloride 98 - 111 mmol/L 99  92  97   CO2 22 - 32 mmol/L 23  24  25   $ Calcium 8.9 - 10.3 mg/dL 8.4  8.7  10.1   Total Protein 6.5 - 8.1 g/dL  6.3  7.4   Total Bilirubin 0.3 - 1.2 mg/dL  1.4  1.0   Alkaline Phos 38 - 126 U/L  37  39   AST 15 - 41 U/L  21  18   ALT 0 - 44 U/L  9  10      Imaging: DG Chest Port 1 View  Result Date: 08/26/2022 CLINICAL DATA:  Cough EXAM: PORTABLE CHEST 1 VIEW COMPARISON:  11/23/2013 FINDINGS: Cardiac silhouette appears prominent. No pneumonia or pulmonary edema. No pneumothorax or pleural effusion. Aorta is calcified. There are thoracic degenerative changes. IMPRESSION: Enlarged cardiac silhouette.  No focal consolidation. Electronically Signed   By: Sammie Bench M.D.   On: 08/24/2022 10:36     EKG: My EKG interpretation is as follows: Concerning for A-fib, borderline RVR, occasional PACs on telemetry  Assessment/Plan:   Principal Problem:   COVID-19 virus infection   Patient Summary: Andrew Brandt is a 72 year old with a history of ESRD s/p renal transplant on immunosuppression, prostate cancer s/p prostatectomy, T2DM and hypertension who presents via EMS for generalized weakness, diarrhea and poor appetite and found to have COVID-19 infection.    #COVID-19 infection #Generalized weakness Presented with generalized weakness, diarrhea and found to be COVID-positive. Patient exposed to wife who had a viral infection 2 weeks ago. Patient started having symptoms about 5 to 7  days ago including a cough that initially improved but has now become productive. He has had watery stools and decreased appetite leading to significant generalized weakness and fatigue.  He remains afebrile, without leukocytosis, and oxygenating well on room air.  No concern for pneumonia on imaging yesterday.  Out of window for any treatment. -O2 supplementation as needed -LR 50 cc/h -Incentive spirometer, pulm toilet -Mucinex 600 mg twice daily -F/u CBC and BMP -PT/OT   #ESRD s/p renal transplant #AKI on CKD4? Patient with a history of ESRD secondary to hypertension will receive HD for 4 years. Received a kidney transplant in 2014 according to him and has been following by [facility] transplant team and [facility]. He gets monthly belatacept injection at [facility] with likely prerenal AKI in the setting of diarrhea and poor p.o. intake which is improved after initial fluid resuscitation.  He confirmed with [place] his immunosuppressive regimen today, which is below.  He does have an intact AV fistula on his right upper extremity.  With improving kidney function, do not think we need to consult nephrology at this time. -Mycophenolate 180 mg twice daily -Prednisone 5 mg daily -Bactrim 400-80 mg every Monday, Wednesday and Friday - IV fluids as above   #New onset paroxysmal A-fib No previous history of A-fib.  Initial EKG had a lot of artifact, unclear whether normal sinus with PVCs versus A-fib.  Telemetry is concerning for A-fib and repeat EKG is also concerning for A-fib.  Rates are borderline high, of note Coreg was held yesterday and restarted today.  He is CHA2DS2-VASc 3, will need to initiate anticoagulation.  Considering chemical cardioversion with amiodarone bolus and infusion in setting of new onset A-fib with underlying covid infection, cardiology consulted for further recommendations. - Appreciate cardiology recs - Coreg 25 mg twice daily  #Anemia Hgb 9.3>8.7 with  fluids.  Previously 10-11 4-5 years ago.  Iron panel and ferritin consistent with anemia of chronic disease, likely chronic kidney disease with possible rise in ferritin due to acute phase reaction in the setting of COVID.  #HTN On Coreg 25 mg daily.  Blood pressures have been normotensive, he is currently in new onset A-fib with borderline elevated rates (see above) 6 -Check orthostatic vitals -Hold Coreg   #T2DM Per spouse, patient was recently diagnosed with diabetes and started on metformin 500 mg twice daily. A1c 7.0 6 months ago.  -  Check A1c -SSI w/ meals, CBG monitoring -Hold metformin  Diet: Heart Healthy IVF: LR,50cc/hr VTE:  eliquis Code: Full PT/OT recs: Pending, . TOC recs:  Family Update:   Dispo: Anticipated discharge pending resolution of afib.   Linus Galas, MD PGY-1 Internal Medicine Resident Please contact the on call pager after 5 pm and on weekends at [phone].

## 2022-09-05 NOTE — Plan of Care (Signed)
  Problem: Respiratory: Goal: Will maintain a patent airway Outcome: Progressing   Problem: Clinical Measurements: Goal: Respiratory complications will improve Outcome: Progressing Goal: Cardiovascular complication will be avoided Outcome: Progressing   Problem: Elimination: Goal: Will not experience complications related to bowel motility Outcome: Progressing Goal: Will not experience complications related to urinary retention Outcome: Progressing

## 2022-09-06 ENCOUNTER — Ambulatory Visit (INDEPENDENT_AMBULATORY_CARE_PROVIDER_SITE_OTHER): Payer: Medicare Other | Admitting: Podiatry

## 2022-09-06 DIAGNOSIS — U071 COVID-19: Secondary | ICD-10-CM | POA: Diagnosis not present

## 2022-09-06 DIAGNOSIS — Z87891 Personal history of nicotine dependence: Secondary | ICD-10-CM | POA: Diagnosis not present

## 2022-09-06 DIAGNOSIS — R197 Diarrhea, unspecified: Secondary | ICD-10-CM

## 2022-09-06 DIAGNOSIS — D849 Immunodeficiency, unspecified: Secondary | ICD-10-CM | POA: Diagnosis not present

## 2022-09-06 DIAGNOSIS — Z91199 Patient's noncompliance with other medical treatment and regimen due to unspecified reason: Secondary | ICD-10-CM

## 2022-09-06 DIAGNOSIS — I4891 Unspecified atrial fibrillation: Secondary | ICD-10-CM | POA: Diagnosis not present

## 2022-09-06 DIAGNOSIS — R531 Weakness: Secondary | ICD-10-CM | POA: Diagnosis not present

## 2022-09-06 LAB — CBC
HCT: 27.4 % — ABNORMAL LOW (ref 39.0–52.0)
Hemoglobin: 8.6 g/dL — ABNORMAL LOW (ref 13.0–17.0)
MCH: 27.9 pg (ref 26.0–34.0)
MCHC: 31.4 g/dL (ref 30.0–36.0)
MCV: 89 fL (ref 80.0–100.0)
Platelets: 137 10*3/uL — ABNORMAL LOW (ref 150–400)
RBC: 3.08 MIL/uL — ABNORMAL LOW (ref 4.22–5.81)
RDW: 18.5 % — ABNORMAL HIGH (ref 11.5–15.5)
WBC: 4.5 10*3/uL (ref 4.0–10.5)
nRBC: 0 % (ref 0.0–0.2)

## 2022-09-06 LAB — BASIC METABOLIC PANEL
Anion gap: 15 (ref 5–15)
BUN: 32 mg/dL — ABNORMAL HIGH (ref 8–23)
CO2: 22 mmol/L (ref 22–32)
Calcium: 8.5 mg/dL — ABNORMAL LOW (ref 8.9–10.3)
Chloride: 101 mmol/L (ref 98–111)
Creatinine, Ser: 1.99 mg/dL — ABNORMAL HIGH (ref 0.61–1.24)
GFR, Estimated: 35 mL/min — ABNORMAL LOW (ref >60)
Glucose, Bld: 93 mg/dL (ref 70–99)
Potassium: 3.3 mmol/L — ABNORMAL LOW (ref 3.5–5.1)
Sodium: 138 mmol/L (ref 135–145)

## 2022-09-06 LAB — GLUCOSE, CAPILLARY
Glucose-Capillary: 91 mg/dL (ref 70–99)
Glucose-Capillary: 107 mg/dL — ABNORMAL HIGH (ref 70–99)
Glucose-Capillary: 109 mg/dL — ABNORMAL HIGH (ref 70–99)
Glucose-Capillary: 106 mg/dL — ABNORMAL HIGH (ref 70–99)

## 2022-09-06 LAB — MAGNESIUM: Magnesium: 2 mg/dL (ref 1.7–2.4)

## 2022-09-06 LAB — TSH: TSH: 0.586 u[IU]/mL (ref 0.350–4.500)

## 2022-09-06 MED ORDER — BENZONATATE 100 MG PO CAPS
100.0000 mg | ORAL_CAPSULE | Freq: Three times a day (TID) | ORAL | Status: DC | PRN
  Administered 2022-09-06 – 2022-09-09 (×5): 100 mg via ORAL
  Filled 2022-09-06 (×5): qty 1

## 2022-09-06 MED ORDER — CARVEDILOL 12.5 MG PO TABS
12.5000 mg | ORAL_TABLET | Freq: Once | ORAL | Status: AC
  Administered 2022-09-06: 12.5 mg via ORAL
  Filled 2022-09-06: qty 1

## 2022-09-06 MED ORDER — POTASSIUM CHLORIDE 10 MEQ/100ML IV SOLN
10.0000 meq | INTRAVENOUS | Status: AC
  Administered 2022-09-06 (×5): 10 meq via INTRAVENOUS
  Filled 2022-09-06 (×5): qty 100

## 2022-09-06 NOTE — Progress Notes (Signed)
Pt. MEWs score is yellow r/d to pt, tachycardic and tachypnea paged internal medicine and no orders given.Will monitor pt.

## 2022-09-06 NOTE — Progress Notes (Cosign Needed)
    Durable Medical Equipment  (From admission, onward)           Start     Ordered   09/06/22 1106  For home use only DME Walker rolling  Once       Question Answer Comment  Walker: With 5 Inch Wheels   Patient needs a walker to treat with the following condition Gait instability      09/06/22 1111   09/06/22 1106  For home use only DME Bedside commode  Once       Comments: Pt's generalized weakness and decreased activity tolerance, d/t SOB upon exertion and being unable to make it to the bathroom without becoming Hypoxic necessitate recommendation for bedside commode.  Question:  Patient needs a bedside commode to treat with the following condition  Answer:  Weakness   09/06/22 1111

## 2022-09-06 NOTE — Progress Notes (Addendum)
Pt was a no show for apt, charge canceled as pt is hospitalized and reason he missed apt

## 2022-09-06 NOTE — Progress Notes (Signed)
Physical Therapy Treatment Patient Details Name: Andrew Brandt MRN: [id number] DOB: [birth year] Today's Date: 09/06/2022   History of Present Illness Andrew Brandt is a 72 year old who presents via EMS for generalized weakness, diarrhea and poor appetite; with a history of ESRD s/p renal transplant on immunosuppression, prostate cancer s/p prostatectomy, T2DM and hypertension    PT Comments    Pt progressing with mobility. Able to amb in room with min guard assist. Denied any lightheadedness. Continue to recommend home with HHPT.  Orthostatic BPs  Supine 127/90  Sitting 123/81  Standing 103/58  Standing after 3 min 105/63      Recommendations for follow up therapy are one component of a multi-disciplinary discharge planning process, led by the attending physician.  Recommendations may be updated based on patient status, additional functional criteria and insurance authorization.  Follow Up Recommendations  Home health PT     Assistance Recommended at Discharge Set up Supervision/Assistance  Patient can return home with the following Help with stairs or ramp for entrance;Assistance with cooking/housework   Equipment Recommendations  Rolling walker (2 wheels)    Recommendations for Other Services       Precautions / Restrictions Precautions Precautions: Fall Precaution Comments: orthostatic hypotension Restrictions Weight Bearing Restrictions: No     Mobility  Bed Mobility Overal bed mobility: Needs Assistance Bed Mobility: Supine to Sit     Supine to sit: Min assist     General bed mobility comments: assist to elevate trunk into sitting and bring hips to EOB    Transfers Overall transfer level: Needs assistance Equipment used: None Transfers: Sit to/from Stand Sit to Stand: Min assist, Min guard, From elevated surface           General transfer comment: Assist to stabilize with rising on 1st time. Assist for safety on 2nd time     Ambulation/Gait Ambulation/Gait assistance: Min guard Gait Distance (Feet): 25 Feet Assistive device: None Gait Pattern/deviations: Step-through pattern, Decreased stride length, Decreased step length - right, Decreased step length - left, Wide base of support Gait velocity: decr Gait velocity interpretation: <1.31 ft/sec, indicative of household ambulator   General Gait Details: Assist for safety and Heritage manager    Modified Rankin (Stroke Patients Only)       Balance Overall balance assessment: Needs assistance Sitting-balance support: No upper extremity supported, Feet supported Sitting balance-Leahy Scale: Fair     Standing balance support: No upper extremity supported Standing balance-Leahy Scale: Fair Standing balance comment: min guard                            Cognition Arousal/Alertness: Awake/alert Behavior During Therapy: Flat affect Overall Cognitive Status: Within Functional Limits for tasks assessed                                          Exercises      General Comments General comments (skin integrity, edema, etc.): See assessment for orthostatics      Pertinent Vitals/Pain      Home Living                          Prior Function            PT Goals (  current goals can now be found in the care plan section) Progress towards PT goals: Progressing toward goals    Frequency    Min 3X/week      PT Plan Current plan remains appropriate    Co-evaluation              AM-PAC PT "6 Clicks" Mobility   Outcome Measure  Help needed turning from your back to your side while in a flat bed without using bedrails?: None Help needed moving from lying on your back to sitting on the side of a flat bed without using bedrails?: A Little Help needed moving to and from a bed to a chair (including a wheelchair)?: A Little Help needed standing up from a chair  using your arms (e.g., wheelchair or bedside chair)?: A Little Help needed to walk in hospital room?: A Little Help needed climbing 3-5 steps with a railing? : A Little 6 Click Score: 19    End of Session   Activity Tolerance: Patient tolerated treatment well Patient left: in chair;with call bell/phone within reach;with chair alarm set;with family/visitor present   PT Visit Diagnosis: Unsteadiness on feet (R26.81);Muscle weakness (generalized) (M62.81)     Time: [id number] PT Time Calculation (min) (ACUTE ONLY): 29 min  Charges:  $Gait Training: 23-37 mins                     [place] [facility] [place] 09/06/2022, 4:54 PM

## 2022-09-06 NOTE — TOC Initial Note (Signed)
Transition of Care [facility]) - Initial/Assessment Note    Patient Details  Name: Andrew Brandt MRN: [id number] Date of Birth: Dec 17, 1950  Transition of Care [facility]) CM/SW Contact:    Tom-Johnson, Renea Ee, RN Phone Number: 09/06/2022, 11:16 AM  Clinical Narrative:                  CM spoke with patient and significant other at bedside about needs for post hospital transition.  Admitted for Covid-19 Virus. C/o loose stools and cough. On room air. Found to have new onset A-fib, Cardiology following, on Eliquis. Has hx of Renal Transplant in 2014, on [facility]atacept monthly at [facility]. Restarted on Myfortic yesterday 09/05/22.  From home with Significant other, Andrew Brandt. Has 7 step children whom are supportive with care. Independent and drive self prior to admission. Has a cane and shower seat at home.  PCP is Benito Mccreedy, MD [facility] [address]. PT/OT referral called [facility] [place] per Michelle's request. Claiborne Billings voiced acceptance and info on AVS. CM will continue to follow as patient progresses with care towards discharge.       Expected Discharge Plan: [place] Barriers to Discharge: Continued Medical Work up   Patient Goals and CMS Choice Patient states their goals for this hospitalization and ongoing recovery are:: To return home CMS Medicare.gov Compare Post Acute Care list provided to:: Patient Choice offered to / list presented to : Patient, Spouse      Expected Discharge Plan and Services   Discharge Planning Services: CM Consult Post Acute Care Choice: [place] arrangements for the past 2 months: Single Family Home                 DME Arranged: Bedside commode, Walker rolling DME Agency: [employer] Date DME Agency Contacted: 09/06/22 Time DME Agency Contacted: 1110 Representative spoke with at DME Agency: Erasmo Downer HH Arranged: PT, OT HH Agency: [place] Date [place]:  09/06/22 Time [place]: 1100 Representative spoke with at [place]: Claiborne Billings  Prior Living Arrangements/Services Living arrangements for the past 2 months: [place] with:: Significant Other Patient language and need for interpreter reviewed:: Yes Do you feel safe going back to the place where you live?: Yes      Need for Family Participation in Patient Care: Yes (Comment) Care giver support system in place?: Yes (comment) Current home services: DME (Cane, shower seat) Criminal Activity/Legal Involvement Pertinent to Current Situation/Hospitalization: No - Comment as needed  Activities of Daily Living      Permission Sought/Granted Permission sought to share information with : Case Manager, Customer service manager, Family Supports Permission granted to share information with : Yes, Verbal Permission Granted              Emotional Assessment Appearance:: Appears stated age Attitude/Demeanor/Rapport: Engaged, Gracious Affect (typically observed): Accepting, Appropriate, Calm, Hopeful, Pleasant Orientation: : Oriented to Self, Oriented to Place, Oriented to  Time, Oriented to Situation Alcohol / Substance Use: Not Applicable Psych Involvement: No (comment)  Admission diagnosis:  Dehydration [E86.0] Weakness [R53.1] COVID [U07.1] COVID-19 virus infection [U07.1] Patient Active Problem List   Diagnosis Date Noted   COVID-19 virus infection 09/15/2022   End stage renal disease ([place]) 01/05/2014   Thrombocytopenia, unspecified ([place]) 11/25/2013   ESRD (end stage renal disease) ([place]) 11/20/2013   Normocytic anemia 11/20/2013   AKI (acute kidney injury) ([place]) 11/19/2013   HTN (hypertension) 11/19/2013   PCP:  Benito Mccreedy, MD [id number] [id number]: [phone]: [phone]    Social Determinants of Health  (SDOH) Social History: SDOH Screenings   Tobacco Use: Medium Risk (09/05/2022)   SDOH Interventions: Transportation Interventions: Intervention Not Indicated, Inpatient TOC, Patient Resources (Friends/Family)   Readmission Risk Interventions     No data to display

## 2022-09-06 NOTE — Progress Notes (Addendum)
Progress Note  Patient Name: Andrew Brandt Date of Encounter: 09/06/2022  Primary Cardiologist: Sanda Klein, MD  Subjective   Had a rough night sleeping due to cough. No CP or SOB. HR currently 90s on telemetry. Orthostatics not yet available.  Inpatient Medications    Scheduled Meds:  apixaban  5 mg Oral BID   carvedilol  12.5 mg Oral BID WC   Chlorhexidine Gluconate Cloth  6 each Topical Daily   feeding supplement  237 mL Oral BID BM   guaiFENesin  600 mg Oral BID   insulin aspart  0-15 Units Subcutaneous TID WC   mycophenolate  180 mg Oral BID   predniSONE  5 mg Oral Q breakfast   sulfamethoxazole-trimethoprim  1 tablet Oral Q M,W,F   Continuous Infusions:  PRN Meds: acetaminophen **OR** acetaminophen, benzonatate, ondansetron **OR** ondansetron (ZOFRAN) IV, senna-docusate   Vital Signs    Vitals:   09/05/22 2351 09/06/22 0159 09/06/22 0602 09/06/22 0835  BP: 130/79 125/73 130/70 132/78  Pulse: (!) 118 92 91 98  Resp: 20 20 18 18  $ Temp: 98 F (36.7 C) (!) 97.5 F (36.4 C) (!) 97.5 F (36.4 C) 97.7 F (36.5 C)  TempSrc: Oral Oral Oral Oral  SpO2: 96% 99% 98% 96%  Weight:      Height:        Intake/Output Summary (Last 24 hours) at 09/06/2022 0851 Last data filed at 09/06/2022 0200 Gross per 24 hour  Intake 1928.52 ml  Output 0 ml  Net 1928.52 ml      09/11/2022    8:47 AM 04/30/2022    8:23 AM 05/09/2018   11:05 AM  Last 3 Weights  Weight (lbs) 230 lb 257 lb 8 oz 199 lb 15.3 oz  Weight (kg) 104.327 kg 116.801 kg 90.7 kg     Telemetry    Atrial fib rates 80s-1teens - Personally Reviewed  ECG    No new tracings - Personally Reviewed  Physical Exam   GEN: No acute distress.  HEENT: Normocephalic, atraumatic, sclera non-icteric. Neck: No JVD or bruits. Cardiac: irregularly irregular no murmurs, rubs, or gallops.  Respiratory: Clear to auscultation bilaterally. Breathing is unlabored. GI: Soft, nontender, non-distended, BS +x 4. MS: no  deformity. Extremities: No clubbing or cyanosis. Trace ankle edema bilaterally. Distal pedal pulses are 2+ and equal bilaterally. Neuro:  AAOx3. Follows commands. Psych:  Responds to questions appropriately with a normal affect.  Labs    High Sensitivity Troponin:  No results for input(s): "TROPONINIHS" in the last 720 hours.    Cardiac EnzymesNo results for input(s): "TROPONINI" in the last 168 hours. No results for input(s): "TROPIPOC" in the last 168 hours.   Chemistry Recent Labs  Lab 09/01/2022 0919 09/05/22 0036 09/06/22 0314  NA 133* 136 138  K 4.5 3.2* 3.3*  CL 92* 99 101  CO2 24 23 22  $ GLUCOSE 135* 91 93  BUN 48* 42* 32*  CREATININE 2.74* 2.36* 1.99*  CALCIUM 8.7* 8.4* 8.5*  PROT 6.3*  --   --   ALBUMIN 3.2* 2.9*  --   AST 21  --   --   ALT 9  --   --   ALKPHOS 37*  --   --   BILITOT 1.4*  --   --   GFRNONAA 24* 28* 35*  ANIONGAP 17* 14 15     Hematology Recent Labs  Lab 09/03/2022 0851 09/05/22 0035 09/06/22 0314  WBC 5.8 5.2 4.5  RBC 3.39* 3.15* 3.08*  HGB 9.3* 8.7* 8.6*  HCT 30.1* 27.5* 27.4*  MCV 88.8 87.3 89.0  MCH 27.4 27.6 27.9  MCHC 30.9 31.6 31.4  RDW 18.6* 18.5* 18.5*  PLT 158 151 137*    BNPNo results for input(s): "BNP", "PROBNP" in the last 168 hours.   DDimer No results for input(s): "DDIMER" in the last 168 hours.   Radiology    ECHOCARDIOGRAM COMPLETE  Result Date: 09/05/2022    ECHOCARDIOGRAM REPORT   Patient Name:   Andrew Brandt Date of Exam: 09/05/2022 Medical Rec #:  [id number]        Height:       69.0 in Accession #:    [id number]       Weight:       230.0 lb Date of Birth:  [birth year]        BSA:          2.192 m Patient Age:    72 years         BP:           108/72 mmHg Patient Gender: M                HR:           94 bpm. Exam Location:  Inpatient Procedure: 2D Echo, Cardiac Doppler and Color Doppler Indications:    I48.91* Unspeicified atrial fibrillation  History:        Patient has no prior history of Echocardiogram  examinations.                 Risk Factors:Diabetes and Hypertension.  Sonographer:    Phineas Douglas Referring Phys: 19 [place]  1. Left ventricular ejection fraction, by estimation, is 60 to 65%. The left ventricle has normal function. The left ventricle has no regional wall motion abnormalities. There is mild left ventricular hypertrophy of the basal-septal segment. Left ventricular diastolic function could not be evaluated.  2. Right ventricular systolic function is normal. The right ventricular size is normal.  3. Left atrial size was mildly dilated.  4. The mitral valve is normal in structure. Trivial mitral valve regurgitation. No evidence of mitral stenosis.  5. The aortic valve is tricuspid. Aortic valve regurgitation is not visualized. Aortic valve sclerosis is present, with no evidence of aortic valve stenosis. FINDINGS  Left Ventricle: Left ventricular ejection fraction, by estimation, is 60 to 65%. The left ventricle has normal function. The left ventricle has no regional wall motion abnormalities. The left ventricular internal cavity size was normal in size. There is  mild left ventricular hypertrophy of the basal-septal segment. Left ventricular diastolic function could not be evaluated due to atrial fibrillation. Left ventricular diastolic function could not be evaluated. Right Ventricle: The right ventricular size is normal. No increase in right ventricular wall thickness. Right ventricular systolic function is normal. Left Atrium: Left atrial size was mildly dilated. Right Atrium: Right atrial size was normal in size. Pericardium: There is no evidence of pericardial effusion. Mitral Valve: The mitral valve is normal in structure. Trivial mitral valve regurgitation. No evidence of mitral valve stenosis. Tricuspid Valve: The tricuspid valve is normal in structure. Tricuspid valve regurgitation is trivial. No evidence of tricuspid stenosis. Aortic Valve: The aortic valve is  tricuspid. Aortic valve regurgitation is not visualized. Aortic valve sclerosis is present, with no evidence of aortic valve stenosis. Pulmonic Valve: The pulmonic valve was normal in structure. Pulmonic valve regurgitation is not visualized. No evidence of pulmonic stenosis. Aorta: The  aortic root is normal in size and structure. Venous: The inferior vena cava was not well visualized. IAS/Shunts: No atrial level shunt detected by color flow Doppler.  LEFT VENTRICLE PLAX 2D LVIDd:         3.80 cm      Diastology LVIDs:         2.80 cm      LV e' medial:    9.25 cm/s LV PW:         1.00 cm      LV E/e' medial:  10.1 LV IVS:        1.30 cm      LV e' lateral:   12.60 cm/s LVOT diam:     2.10 cm      LV E/e' lateral: 7.4 LV SV:         58 LV SV Index:   26 LVOT Area:     3.46 cm  LV Volumes (MOD) LV vol d, MOD A2C: 117.0 ml LV vol d, MOD A4C: 104.0 ml LV vol s, MOD A2C: 41.3 ml LV vol s, MOD A4C: 38.6 ml LV SV MOD A2C:     75.7 ml LV SV MOD A4C:     104.0 ml LV SV MOD BP:      69.8 ml RIGHT VENTRICLE RV Basal diam:  3.60 cm RV S prime:     10.70 cm/s TAPSE (M-mode): 2.0 cm LEFT ATRIUM             Index        RIGHT ATRIUM           Index LA diam:        4.10 cm 1.87 cm/m   RA Area:     21.80 cm LA Vol (A2C):   73.4 ml 33.48 ml/m  RA Volume:   56.00 ml  25.54 ml/m LA Vol (A4C):   84.5 ml 38.54 ml/m LA Biplane Vol: 80.8 ml 36.85 ml/m  AORTIC VALVE LVOT Vmax:   84.00 cm/s LVOT Vmean:  56.100 cm/s LVOT VTI:    0.167 m  AORTA Ao Root diam: 3.10 cm Ao Asc diam:  3.30 cm MITRAL VALVE               TRICUSPID VALVE MV Area (PHT): 2.64 cm    TR Peak grad:   23.2 mmHg MV Decel Time: 287 msec    TR Vmax:        241.00 cm/s MV E velocity: 93.80 cm/s                            SHUNTS                            Systemic VTI:  0.17 m                            Systemic Diam: 2.10 cm Fransico Him MD Electronically signed by Fransico Him MD Signature Date/Time: 09/05/2022/4:43:01 PM    Final    DG Chest Port 1 View  Result  Date: 08/26/2022 CLINICAL DATA:  Cough EXAM: PORTABLE CHEST 1 VIEW COMPARISON:  11/23/2013 FINDINGS: Cardiac silhouette appears prominent. No pneumonia or pulmonary edema. No pneumothorax or pleural effusion. Aorta is calcified. There are thoracic degenerative changes. IMPRESSION: Enlarged cardiac silhouette.  No focal consolidation. Electronically Signed   By: Sammie Bench  M.D.   On: 09/08/2022 10:36    Cardiac Studies   2d Echo 09/05/22   1. Left ventricular ejection fraction, by estimation, is 60 to 65%. The  left ventricle has normal function. The left ventricle has no regional  wall motion abnormalities. There is mild left ventricular hypertrophy of  the basal-septal segment. Left  ventricular diastolic function could not be evaluated.   2. Right ventricular systolic function is normal. The right ventricular  size is normal.   3. Left atrial size was mildly dilated.   4. The mitral valve is normal in structure. Trivial mitral valve  regurgitation. No evidence of mitral stenosis.   5. The aortic valve is tricuspid. Aortic valve regurgitation is not  visualized. Aortic valve sclerosis is present, with no evidence of aortic  valve stenosis.   Patient Profile     72 y.o. male with ESRD s/p subsequent kidney transplant 01/2019 followed at [facility] (subsequent CKD stage IV with Cr 2.71 in 07/2022) on chronic immunosuppressants, prostate CA s/p prostatectomy 2016, HTN, DM, prior anemia on labs who presented to the hospital with URI sx x 2 weeks, initially improved then onset of diarrhea, recurrent cough, and weakness about 1 week ago. Was orthostatic this admission, also in new onset Afib of unclear chronicity.  Assessment & Plan    1. Newly recognized atrial fibrillation - chronicity unclear but may be provoked by recent acute medical illness - did not receive carvedilol AM of 2/14 - resumed in afternoon at lower dose 12.51m BID, did not yet receive this AM but pending - follow with dosing,  orthostatics still pending - since not anticoagulated prior to admission, do not anticipate urgent DCCV - would avoid digoxin with renal disease - TSH wnl - echo reassuring - given CHADSVASC 3, started on anticoagulation - confirmed no drug/drug interactions with transplant meds with pharmD yesterday - have arranged afib clinic f/u 2/28, appt info placed on AVS - would consider DCCV after 3 weeks of uninterrupted anticoagulation if still in afib at follow-up - will review final recs with MD as IM team considering DC today   2. Mild LE edema - clinically dry on presentation to the hospital - may be related to decreased albumin level, no indication for diuresis presently - echo reassuring, EF 60-65%, mild LVH of basal septal segment, mild LAE   3. Hypokalemia/hypomagnesemia - primary team managing - recommend keeping K at least 4.0 and Mg at least 2.0   Remainder of medical issues per primary team: - Covid-19 infection with diarrhea - anemia and thrombocytopenia similarly seen in 07/2022, no bleeding reported - ERSD s/p transplant with subsequent CKD stage IV - DM    For questions or updates, please contact CMangumPlease consult [email or web address] for contact info under Cardiology/STEMI.  Signed, DCharlie Pitter PA-C 09/06/2022, 8:51 AM    I have seen and examined the patient along with DCharlie Pitter PA-C. I have reviewed the chart, notes and new data.  I agree with PA/NP's note.  Key new complaints: Biggest complaint remains the cough related to the acute viral illness.  No clear cardiovascular complaints.  Remains unaware of palpitations despite the arrhythmia. Key examination changes: Atrial fibrillation with borderline ventricular rate control, probably appropriate under the circumstances. Key new findings / data: Creatinine actually showing substantial improvement from admission at 1.99.  Mildly hypokalemic.  Moderate anemia, stable. Echo shows normal left ventricular  systolic function with EF 60 to 65%, normal right ventricular systolic function, no  serious valvular abnormalities, only mild dilation of the left atrium (end-systolic diameter 41 mm, end-systolic volume index 37 mL/m)  PLAN: Will follow along to see how his ventricular rate behaves on the current dose of carvedilol. Eliquis 5 mg twice daily. Reevaluate for possible cardioversion in 3 weeks, if he remains in atrial fibrillation.  Has had mild left atrial dilation, which reinforces the impression that he may have atrial fibrillation even beyond the acute viral illness.  No plan for antiarrhythmics at this time.  Sanda Klein, MD, [place] [phone] 09/06/2022, 11:44 AM

## 2022-09-06 NOTE — Progress Notes (Signed)
Subjective:   Summary: Andrew Brandt is a 72 y.o. year old male currently admitted on the IMTS HD#1 for generalized weakness/diarrhea with COVID.  Overnight Events:  Tachycardia 130s, gave second dose of Coreg.  Also had 3 large watery bowel movements with mucus.  Remains oxygenating well on room air.  Patient's girlfriend in room with him.  Discussed his current status including improving kidney function, ongoing diarrhea as well as treatment plan.  Unfortunately, he will have to reschedule his belatacept administration this month.  Checked with his nephrologist and they are okay with delaying this for a few days while he is admitted.  History of normal tried to reschedule his belatacept for early next week, he should be discharged by then.  Upon our assessment, patient mentions he continues to have diarrhea.  No melena, emesis.  No chest pain or dyspnea.  Some abdominal pain.  Some chills.  He will try to increase p.o. intake today as tolerated.  Objective:  Vital signs in last 24 hours: Vitals:   09/05/22 2039 09/05/22 2351 09/06/22 0159 09/06/22 0602  BP: (!) 147/89 130/79 125/73 130/70  Pulse: 99 (!) 118 92 91  Resp: 20 20 20 18  $ Temp: 98.3 F (36.8 C) 98 F (36.7 C) (!) 97.5 F (36.4 C) (!) 97.5 F (36.4 C)  TempSrc: Oral Oral Oral Oral  SpO2: 97% 96% 99% 98%  Weight:      Height:       Supplemental O2: Room Air SpO2: 98 %   Physical Exam:  Constitutional: Elderly-appearing, laying in bed, in no acute distress Cardiovascular: Irregular rhythm, nontachycardic no murmurs, rubs or gallops Pulmonary/Chest: normal work of breathing on room air, minimal expiratory wheezing and rhonchi diffusely. Abdominal: soft, non-tender, mildly distended Skin: warm and dry Extremities: upper/lower extremity pulses 2+, no lower extremity edema present  [facility] Weights   08/24/2022 0847  Weight: 104.3 kg     Intake/Output Summary (Last 24 hours) at 09/06/2022  0737 Last data filed at 09/06/2022 0200 Gross per 24 hour  Intake 2168.52 ml  Output 0 ml  Net 2168.52 ml    Net IO Since Admission: 3,593.73 mL [09/06/22 0737]  Pertinent Labs:    Latest Ref Rng & Units 09/06/2022    3:14 AM 09/05/2022   12:35 AM 09/13/2022    8:51 AM  CBC  WBC 4.0 - 10.5 K/uL 4.5  5.2  5.8   Hemoglobin 13.0 - 17.0 g/dL 8.6  8.7  9.3   Hematocrit 39.0 - 52.0 % 27.4  27.5  30.1   Platelets 150 - 400 K/uL 137  151  158        Latest Ref Rng & Units 09/06/2022    3:14 AM 09/05/2022   12:36 AM 08/31/2022    9:19 AM  CMP  Glucose 70 - 99 mg/dL 93  91  135   BUN 8 - 23 mg/dL 32  42  48   Creatinine 0.61 - 1.24 mg/dL 1.99  2.36  2.74   Sodium 135 - 145 mmol/L 138  136  133   Potassium 3.5 - 5.1 mmol/L 3.3  3.2  4.5   Chloride 98 - 111 mmol/L 101  99  92   CO2 22 - 32 mmol/L 22  23  24   $ Calcium 8.9 - 10.3 mg/dL 8.5  8.4  8.7   Total Protein 6.5 - 8.1 g/dL  6.3   Total Bilirubin 0.3 - 1.2 mg/dL   1.4   Alkaline Phos 38 - 126 U/L   37   AST 15 - 41 U/L   21   ALT 0 - 44 U/L   9      Imaging: ECHOCARDIOGRAM COMPLETE  Result Date: 09/05/2022    ECHOCARDIOGRAM REPORT   Patient Name:   Andrew Brandt Date of Exam: 09/05/2022 Medical Rec #:  [id number]        Height:       69.0 in Accession #:    [id number]       Weight:       230.0 lb Date of Birth:  06-10-1951        BSA:          2.192 m Patient Age:    15 years         BP:           108/72 mmHg Patient Gender: M                HR:           94 bpm. Exam Location:  Inpatient Procedure: 2D Echo, Cardiac Doppler and Color Doppler Indications:    I48.91* Unspeicified atrial fibrillation  History:        Patient has no prior history of Echocardiogram examinations.                 Risk Factors:Diabetes and Hypertension.  Sonographer:    Phineas Douglas Referring Phys: 87 [place]  1. Left ventricular ejection fraction, by estimation, is 60 to 65%. The left ventricle has normal function. The left ventricle  has no regional wall motion abnormalities. There is mild left ventricular hypertrophy of the basal-septal segment. Left ventricular diastolic function could not be evaluated.  2. Right ventricular systolic function is normal. The right ventricular size is normal.  3. Left atrial size was mildly dilated.  4. The mitral valve is normal in structure. Trivial mitral valve regurgitation. No evidence of mitral stenosis.  5. The aortic valve is tricuspid. Aortic valve regurgitation is not visualized. Aortic valve sclerosis is present, with no evidence of aortic valve stenosis. FINDINGS  Left Ventricle: Left ventricular ejection fraction, by estimation, is 60 to 65%. The left ventricle has normal function. The left ventricle has no regional wall motion abnormalities. The left ventricular internal cavity size was normal in size. There is  mild left ventricular hypertrophy of the basal-septal segment. Left ventricular diastolic function could not be evaluated due to atrial fibrillation. Left ventricular diastolic function could not be evaluated. Right Ventricle: The right ventricular size is normal. No increase in right ventricular wall thickness. Right ventricular systolic function is normal. Left Atrium: Left atrial size was mildly dilated. Right Atrium: Right atrial size was normal in size. Pericardium: There is no evidence of pericardial effusion. Mitral Valve: The mitral valve is normal in structure. Trivial mitral valve regurgitation. No evidence of mitral valve stenosis. Tricuspid Valve: The tricuspid valve is normal in structure. Tricuspid valve regurgitation is trivial. No evidence of tricuspid stenosis. Aortic Valve: The aortic valve is tricuspid. Aortic valve regurgitation is not visualized. Aortic valve sclerosis is present, with no evidence of aortic valve stenosis. Pulmonic Valve: The pulmonic valve was normal in structure. Pulmonic valve regurgitation is not visualized. No evidence of pulmonic stenosis. Aorta:  The aortic root is normal in size and structure. Venous: The inferior vena cava was not well visualized. IAS/Shunts:  No atrial level shunt detected by color flow Doppler.  LEFT VENTRICLE PLAX 2D LVIDd:         3.80 cm      Diastology LVIDs:         2.80 cm      LV e' medial:    9.25 cm/s LV PW:         1.00 cm      LV E/e' medial:  10.1 LV IVS:        1.30 cm      LV e' lateral:   12.60 cm/s LVOT diam:     2.10 cm      LV E/e' lateral: 7.4 LV SV:         58 LV SV Index:   26 LVOT Area:     3.46 cm  LV Volumes (MOD) LV vol d, MOD A2C: 117.0 ml LV vol d, MOD A4C: 104.0 ml LV vol s, MOD A2C: 41.3 ml LV vol s, MOD A4C: 38.6 ml LV SV MOD A2C:     75.7 ml LV SV MOD A4C:     104.0 ml LV SV MOD BP:      69.8 ml RIGHT VENTRICLE RV Basal diam:  3.60 cm RV S prime:     10.70 cm/s TAPSE (M-mode): 2.0 cm LEFT ATRIUM             Index        RIGHT ATRIUM           Index LA diam:        4.10 cm 1.87 cm/m   RA Area:     21.80 cm LA Vol (A2C):   73.4 ml 33.48 ml/m  RA Volume:   56.00 ml  25.54 ml/m LA Vol (A4C):   84.5 ml 38.54 ml/m LA Biplane Vol: 80.8 ml 36.85 ml/m  AORTIC VALVE LVOT Vmax:   84.00 cm/s LVOT Vmean:  56.100 cm/s LVOT VTI:    0.167 m  AORTA Ao Root diam: 3.10 cm Ao Asc diam:  3.30 cm MITRAL VALVE               TRICUSPID VALVE MV Area (PHT): 2.64 cm    TR Peak grad:   23.2 mmHg MV Decel Time: 287 msec    TR Vmax:        241.00 cm/s MV E velocity: 93.80 cm/s                            SHUNTS                            Systemic VTI:  0.17 m                            Systemic Diam: 2.10 cm Fransico Him MD Electronically signed by Fransico Him MD Signature Date/Time: 09/05/2022/4:43:01 PM    Final      EKG: My EKG interpretation is as follows: A-fib in and out of RVR  Assessment/Plan:   Principal Problem:   COVID-19 virus infection   Patient Summary: Mr. Mcnamar is a 72 year old with a history of ESRD s/p renal transplant on immunosuppression, prostate cancer s/p prostatectomy, T2DM and hypertension  who presents via EMS for generalized weakness, diarrhea and poor appetite and found to have COVID-19 infection.    #COVID-19 infection #Generalized weakness #Diarrhea Still  endorsing generalized weakness, though respiratory status is stable and he remains afebrile without leukocytosis. Of note, he continues to have frequent large-volume watery diarrhea with mucus.  Wonder if this is actually just because of COVID or if he has other concomitant infection.  Will order GI panel to rule out.  Do not think he has significant C. difficile risk at this point. -Follow-up GI panel -O2 supplementation as needed -Incentive spirometer, pulm toilet -Mucinex 600 mg twice daily -F/u CBC and BMP -PT/OT   #ESRD s/p renal transplant #AKI on CKD4? Kidney function continues to improve today.  Will see if he can sustain himself volume wise with p.o. intake.  He will have to delay his belatacept administration for this month.  Girlfriend will try to schedule for next week. -Mycophenolate 180 mg twice daily -Prednisone 5 mg daily -Bactrim 400-80 mg every Monday, Wednesday and Friday -Make sure he has belatacept scheduled next week.   #New onset paroxysmal A-fib He remains in A-fib.  On Coreg 12.5 mg twice daily.  Rates are better controlled this morning and throughout the course of the day.  Eliquis 5 mg twice daily started given he is CHA2DS2-VASc 3.  Cardiology will hold off on cardioversion given uncertain timeframe until his A-fib follow-up later this month.  Hypokalemic this morning with normal mag.  Will replete.  TSH was within normal limits.  Echo yesterday showed normal LVEF, mild LVH, mildly dilated left atrium. - Appreciate cardiology recs.  He should follow-up with cardiology later this month. - Coreg 12.5 mg twice daily - Trend mag, BMP, replete as needed  #Anemia Hgb stable. Iron panel and ferritin consistent with anemia of chronic disease, likely chronic kidney disease with possible rise in  ferritin due to acute phase reaction in the setting of COVID.  #HTN Remains normotensive on Coreg 12.5 twice daily, decrease yesterday given orthostasis.   #T2DM Per spouse, patient was recently diagnosed with diabetes and started on metformin 500 mg twice daily. A1c 7.0 6 months ago, 6.1 here. -SSI w/ meals, CBG monitoring -Hold metformin  Diet: Heart Healthy IVF: None VTE:  eliquis Code: Full PT/OT recs: Home health TOC recs:  Family Update:   Dispo: Anticipated discharge pending clinical stability and ongoing workup  Linus Galas, MD PGY-1 Internal Medicine Resident Please contact the on call pager after 5 pm and on weekends at [phone].

## 2022-09-07 ENCOUNTER — Telehealth: Payer: Self-pay | Admitting: *Deleted

## 2022-09-07 ENCOUNTER — Other Ambulatory Visit (HOSPITAL_COMMUNITY): Payer: Self-pay

## 2022-09-07 DIAGNOSIS — U071 COVID-19: Secondary | ICD-10-CM | POA: Diagnosis not present

## 2022-09-07 DIAGNOSIS — R531 Weakness: Secondary | ICD-10-CM | POA: Diagnosis not present

## 2022-09-07 DIAGNOSIS — K625 Hemorrhage of anus and rectum: Secondary | ICD-10-CM

## 2022-09-07 DIAGNOSIS — I4891 Unspecified atrial fibrillation: Secondary | ICD-10-CM | POA: Diagnosis not present

## 2022-09-07 DIAGNOSIS — D849 Immunodeficiency, unspecified: Secondary | ICD-10-CM | POA: Diagnosis not present

## 2022-09-07 DIAGNOSIS — R197 Diarrhea, unspecified: Secondary | ICD-10-CM | POA: Diagnosis not present

## 2022-09-07 LAB — CBC
HCT: 31.3 % — ABNORMAL LOW (ref 39.0–52.0)
HCT: 30.5 % — ABNORMAL LOW (ref 39.0–52.0)
HCT: 28.2 % — ABNORMAL LOW (ref 39.0–52.0)
Hemoglobin: 9.8 g/dL — ABNORMAL LOW (ref 13.0–17.0)
Hemoglobin: 9.2 g/dL — ABNORMAL LOW (ref 13.0–17.0)
Hemoglobin: 8.4 g/dL — ABNORMAL LOW (ref 13.0–17.0)
MCH: 28.2 pg (ref 26.0–34.0)
MCH: 27 pg (ref 26.0–34.0)
MCH: 27.3 pg (ref 26.0–34.0)
MCHC: 31.3 g/dL (ref 30.0–36.0)
MCHC: 30.2 g/dL (ref 30.0–36.0)
MCHC: 29.8 g/dL — ABNORMAL LOW (ref 30.0–36.0)
MCV: 89.9 fL (ref 80.0–100.0)
MCV: 89.4 fL (ref 80.0–100.0)
MCV: 91.6 fL (ref 80.0–100.0)
Platelets: 153 10*3/uL (ref 150–400)
Platelets: 144 10*3/uL — ABNORMAL LOW (ref 150–400)
Platelets: 150 10*3/uL (ref 150–400)
RBC: 3.48 MIL/uL — ABNORMAL LOW (ref 4.22–5.81)
RBC: 3.41 MIL/uL — ABNORMAL LOW (ref 4.22–5.81)
RBC: 3.08 MIL/uL — ABNORMAL LOW (ref 4.22–5.81)
RDW: 18.6 % — ABNORMAL HIGH (ref 11.5–15.5)
RDW: 18.6 % — ABNORMAL HIGH (ref 11.5–15.5)
RDW: 18.6 % — ABNORMAL HIGH (ref 11.5–15.5)
WBC: 5.2 10*3/uL (ref 4.0–10.5)
WBC: 5 10*3/uL (ref 4.0–10.5)
WBC: 5.2 10*3/uL (ref 4.0–10.5)
nRBC: 0 % (ref 0.0–0.2)
nRBC: 0 % (ref 0.0–0.2)
nRBC: 0 % (ref 0.0–0.2)

## 2022-09-07 LAB — GLUCOSE, CAPILLARY
Glucose-Capillary: 82 mg/dL (ref 70–99)
Glucose-Capillary: 114 mg/dL — ABNORMAL HIGH (ref 70–99)
Glucose-Capillary: 110 mg/dL — ABNORMAL HIGH (ref 70–99)
Glucose-Capillary: 95 mg/dL (ref 70–99)

## 2022-09-07 LAB — COMPREHENSIVE METABOLIC PANEL
ALT: 8 U/L (ref 0–44)
AST: 17 U/L (ref 15–41)
Albumin: 2.8 g/dL — ABNORMAL LOW (ref 3.5–5.0)
Alkaline Phosphatase: 36 U/L — ABNORMAL LOW (ref 38–126)
Anion gap: 13 (ref 5–15)
BUN: 26 mg/dL — ABNORMAL HIGH (ref 8–23)
CO2: 22 mmol/L (ref 22–32)
Calcium: 8.9 mg/dL (ref 8.9–10.3)
Chloride: 103 mmol/L (ref 98–111)
Creatinine, Ser: 1.75 mg/dL — ABNORMAL HIGH (ref 0.61–1.24)
GFR, Estimated: 41 mL/min — ABNORMAL LOW (ref >60)
Glucose, Bld: 91 mg/dL (ref 70–99)
Potassium: 4.5 mmol/L (ref 3.5–5.1)
Sodium: 138 mmol/L (ref 135–145)
Total Bilirubin: 0.7 mg/dL (ref 0.3–1.2)
Total Protein: 5.5 g/dL — ABNORMAL LOW (ref 6.5–8.1)

## 2022-09-07 MED ORDER — IPRATROPIUM-ALBUTEROL 0.5-2.5 (3) MG/3ML IN SOLN: 3.0000 mL | Freq: Once | RESPIRATORY_TRACT | Status: DC

## 2022-09-07 MED ORDER — METOPROLOL TARTRATE 50 MG PO TABS
50.0000 mg | ORAL_TABLET | Freq: Two times a day (BID) | ORAL | Status: DC
  Administered 2022-09-07 – 2022-09-10 (×6): 50 mg via ORAL
  Filled 2022-09-07 (×6): qty 1

## 2022-09-07 MED ORDER — APIXABAN 5 MG PO TABS
5.0000 mg | ORAL_TABLET | Freq: Two times a day (BID) | ORAL | 0 refills | Status: AC
  Filled 2022-09-07: qty 60, 30d supply, fill #0

## 2022-09-07 MED ORDER — SODIUM CHLORIDE 0.9% IV SOLUTION: Freq: Once | INTRAVENOUS | Status: DC

## 2022-09-07 MED ORDER — LACTATED RINGERS IV SOLN: INTRAVENOUS | Status: AC

## 2022-09-07 MED ORDER — METOPROLOL TARTRATE 50 MG PO TABS
50.0000 mg | ORAL_TABLET | Freq: Two times a day (BID) | ORAL | 0 refills | Status: AC
  Filled 2022-09-07: qty 60, 30d supply, fill #0

## 2022-09-07 MED ORDER — IPRATROPIUM-ALBUTEROL 0.5-2.5 (3) MG/3ML IN SOLN
3.0000 mL | RESPIRATORY_TRACT | Status: DC
  Administered 2022-09-07 – 2022-09-08 (×6): 3 mL via RESPIRATORY_TRACT
  Filled 2022-09-07 (×5): qty 3

## 2022-09-07 NOTE — Progress Notes (Addendum)
Subjective:   Summary: Andrew Brandt is a 72 y.o. year old male currently admitted on the IMTS HD#2 for generalized weakness/diarrhea with COVID.  Overnight Events:  Mildly tachy, hd stable overnight.  Remains in A-fib.  During assessment this morning, he endorses continuous weakness.  He is comfortable on room air but does have minimal wheezing with coughing.  He has not had significant diarrhea overnight or this morning.  Discussed possible discharge later today depending on his clinical course.  Cardiology has changed his Coreg 12.5 to metoprolol 50 twice daily for rate control and because he was orthostatic on Coreg.  Update: Informed about patient having worsening weakness and new bleeding per rectum.  RN mentioned patient was moved from chair back to bed and was noticed that he had moderate volume bloody smears left over on the chair.  Did not seem to be stool just blood content.  Went to assess patient at this time.  He endorsed feeling weak, having minimal p.o. intake, said nebulizer treatment earlier today improved his wheezing and that he was still comfortable on room air.  He has not had any large-volume black/tarry stools or large-volume bloody stools.  No nausea or emesis.  No abdominal pain.  On assessment, he does have moderate volume fresh bright red blood beneath his rectum.  Rectal exam does not reveal any clear hemorrhoids. Abdomen is soft, distended, no TTP.  He is hemodynamically stable at this time.  Obtained stat CBC, held Eliquis, will consult GI and move him to progressive in case he has any hemodynamic decompensation     Objective:  Vital signs in last 24 hours: Vitals:   09/06/22 2054 09/06/22 2130 09/07/22 0133 09/07/22 0512  BP: (!) 143/88 (!) 147/85 128/81 120/84  Pulse: 98 100 100 97  Resp: (!) 26   18  Temp: 97.8 F (36.6 C) (!) 97.5 F (36.4 C) 98.1 F (36.7 C) 98.2 F (36.8 C)  TempSrc: Oral Oral Oral Oral  SpO2: 98%  99% 97%   Weight:      Height:       Supplemental O2: Room Air SpO2: 97 %   Physical Exam:  See update in HPI for repeat exam findings during assessment of possible GI bleed.  Constitutional: Elderly-appearing, laying in bed, in no acute distress Cardiovascular: Irregular rhythm, nontachycardic no murmurs, rubs or gallops Pulmonary/Chest: normal work of breathing on room air, minimal expiratory wheezing and rhonchi diffusely. Abdominal: soft, non-tender, mildly distended Skin: warm and dry Extremities: upper/lower extremity pulses 2+, no lower extremity edema present  [facility] Weights   09/16/2022 0847  Weight: 104.3 kg     Intake/Output Summary (Last 24 hours) at 09/07/2022 0654 Last data filed at 09/07/2022 0232 Gross per 24 hour  Intake 360 ml  Output 750 ml  Net -390 ml    Net IO Since Admission: 3,203.73 mL [09/07/22 0654]  Pertinent Labs:    Latest Ref Rng & Units 09/07/2022    3:35 AM 09/06/2022    3:14 AM 09/05/2022   12:35 AM  CBC  WBC 4.0 - 10.5 K/uL 5.2  4.5  5.2   Hemoglobin 13.0 - 17.0 g/dL 9.8  8.6  8.7   Hematocrit 39.0 - 52.0 % 31.3  27.4  27.5   Platelets 150 - 400 K/uL 153  137  151        Latest Ref Rng & Units 09/07/2022  3:35 AM 09/06/2022    3:14 AM 09/05/2022   12:36 AM  CMP  Glucose 70 - 99 mg/dL 91  93  91   BUN 8 - 23 mg/dL 26  32  42   Creatinine 0.61 - 1.24 mg/dL 1.75  1.99  2.36   Sodium 135 - 145 mmol/L 138  138  136   Potassium 3.5 - 5.1 mmol/L 4.5  3.3  3.2   Chloride 98 - 111 mmol/L 103  101  99   CO2 22 - 32 mmol/L 22  22  23   $ Calcium 8.9 - 10.3 mg/dL 8.9  8.5  8.4   Total Protein 6.5 - 8.1 g/dL 5.5     Total Bilirubin 0.3 - 1.2 mg/dL 0.7     Alkaline Phos 38 - 126 U/L 36     AST 15 - 41 U/L 17     ALT 0 - 44 U/L 8        Imaging: No results found.   EKG: My EKG interpretation is as follows: A-fib with controlled rate  Assessment/Plan:   Principal Problem:   COVID-19 virus infection   Patient Summary: Mr. Andrew Brandt is a  72 year old with a history of ESRD s/p renal transplant on immunosuppression, prostate cancer s/p prostatectomy, T2DM and hypertension who presents via EMS for generalized weakness, diarrhea and poor appetite and found to have COVID-19 infection.    #Bleeding per rectum Started having bleeding per rectum starting earlier today. Remains HD stable. Repeat CBC shows hgb 9.8>9.2. Held eliquis and restarted fluids for volume support. Informed cardiology and consulted GI. Will transition to progressive as well in case he has any decompensation. -appreciate GI recs -trend Hgb, transfuse <7  #COVID-19 infection #Generalized weakness #Diarrhea Still endorsing generalized weakness, though respiratory status is stable and he remains afebrile without leukocytosis. No recurrent watery diarrhea today, but did have bright red bleeding as above. -Follow-up GI panel -O2 supplementation as needed -Incentive spirometer, pulm toilet -Mucinex 600 mg twice daily -F/u CBC and BMP -HH PT   #ESRD s/p renal transplant #AKI on CKD4? Kidney function continues to improve today.  Will restart fluids in setting of new possible GI bleed.  He will have to delay his belatacept administration for this month.  Girlfriend will try to schedule for next week. -Mycophenolate 180 mg twice daily -Prednisone 5 mg daily -Bactrim 400-80 mg every Monday, Wednesday and Friday -Make sure he has belatacept scheduled next week.   #New onset paroxysmal A-fib He remains in A-fib. Eliquis 5 mg twice daily started given he is CHA2DS2-VASc 3.  Cardiology will hold off on cardioversion given uncertain timeframe until his A-fib follow-up later this month. Coreg 12.44m BID changed to metoprolol tartrate 511mBID for better rate control and because of orthostasis with coreg. Holding eliquis now in setting of new GI bleed. - Appreciate cardiology recs.  He should follow-up with cardiology later this month. - Coreg 12.5 mg twice daily - Trend  mag, BMP, replete as needed  #Anemia Initial Iron panel and ferritin consistent with anemia of chronic disease, likely chronic kidney disease with possible rise in ferritin due to acute phase reaction in the setting of COVID. Repeat Hgb stable after onset of GI bleeding. Will continue to monitor daily for now, can increase frequency and obtain stat if bleeding worsens  #HTN Regimen changed to metoprolol tartate 5092mID as above.   #T2DM Per spouse, patient was recently diagnosed with diabetes and started on metformin 500 mg twice  daily. A1c 7.0 6 months ago, 6.1 here. -SSI w/ meals, CBG monitoring -Hold metformin  Diet: Heart Healthy IVF: LR 100 cc/hr VTE: held in setting of gi bleed. Code: Full PT/OT recs: Home health, needs reassessment TOC recs:  Family Update:   Dispo: Anticipated discharge pending clinical stability and ongoing workup  Linus Galas, MD PGY-1 Internal Medicine Resident Please contact the on call pager after 5 pm and on weekends at [phone].

## 2022-09-07 NOTE — Progress Notes (Signed)
Received update from primary team that pt now having rectal bleeding. They are holding Eliquis and plan to consult GI for evaluation. From cardiac standpoint, per d/w Dr. Sallyanne Kuster, no contraindication to proceeding with endoscopic procedures if needed - would defer to medicine team regarding Covid infection. We will add back to rounds for our team to continue to follow for rate control.

## 2022-09-07 NOTE — Progress Notes (Signed)
Occupational Therapy Treatment Patient Details Name: Andrew Brandt MRN: [id number] DOB: 11-25-1950 Today's Date: 09/07/2022   History of present illness Andrew Brandt is a 72 year old who presents via EMS for generalized weakness, diarrhea and poor appetite; with a history of ESRD s/p renal transplant on immunosuppression, prostate cancer s/p prostatectomy, T2DM and hypertension   OT comments  Pt reports increased coughing at night. Continues to move slowly and needs verbal cues for thoroughness and to sequence ADLs. Completed UB bathing with mod assist, dressing with min assist and pericare with max assist for posterior care. Min guard without AD from elevated surface for standing ADLs and to transfer to chair. Pt with c/o weakness, but no dizziness/lightheadedness.    Recommendations for follow up therapy are one component of a multi-disciplinary discharge planning process, led by the attending physician.  Recommendations may be updated based on patient status, additional functional criteria and insurance authorization.    Follow Up Recommendations  Home health OT     Assistance Recommended at Discharge Frequent or constant Supervision/Assistance  Patient can return home with the following  A little help with walking and/or transfers;A lot of help with bathing/dressing/bathroom;Assistance with cooking/housework;Assist for transportation;Help with stairs or ramp for entrance   Equipment Recommendations  Tub/shower seat    Recommendations for Other Services      Precautions / Restrictions Precautions Precautions: Fall Precaution Comments: orthostatic hypotension Restrictions Weight Bearing Restrictions: No       Mobility Bed Mobility Overal bed mobility: Needs Assistance Bed Mobility: Supine to Sit     Supine to sit: Min assist     General bed mobility comments: assist to elevate trunk pulling up on therapist's hand    Transfers Overall transfer level: Needs  assistance Equipment used: None Transfers: Sit to/from Stand Sit to Stand: Min guard, From elevated surface                 Balance Overall balance assessment: Needs assistance   Sitting balance-Leahy Scale: Fair       Standing balance-Leahy Scale: Fair                             ADL either performed or assessed with clinical judgement   ADL Overall ADL's : Needs assistance/impaired     Grooming: Wash/dry hands;Wash/dry face;Oral care;Sitting;Minimal assistance   Upper Body Bathing: Moderate assistance;Sitting       Upper Body Dressing : Minimal assistance;Sitting           Toileting- Clothing Manipulation and Hygiene: Maximal assistance;Sit to/from stand       Functional mobility during ADLs: Min guard      Extremity/Trunk Assessment              Vision       Perception     Praxis      Cognition Arousal/Alertness: Awake/alert Behavior During Therapy: Flat affect Overall Cognitive Status: Impaired/Different from baseline Area of Impairment: Problem solving                             Problem Solving: Decreased initiation, Slow processing, Difficulty sequencing, Requires verbal cues General Comments: assist to sequence toothbrushing, decreased thoroughness with bathing        Exercises      Shoulder Instructions       General Comments      Pertinent Vitals/ Pain       Pain  Assessment Pain Assessment: No/denies pain  Home Living                                          Prior Functioning/Environment              Frequency  Min 2X/week        Progress Toward Goals  OT Goals(current goals can now be found in the care plan section)  Progress towards OT goals: Progressing toward goals  Acute Rehab OT Goals OT Goal Formulation: With patient/family Time For Goal Achievement: 09/19/22 Potential to Achieve Goals: Good  Plan Discharge plan remains appropriate     Co-evaluation                 AM-PAC OT "6 Clicks" Daily Activity     Outcome Measure   Help from another person eating meals?: None Help from another person taking care of personal grooming?: A Little Help from another person toileting, which includes using toliet, bedpan, or urinal?: A Lot Help from another person bathing (including washing, rinsing, drying)?: A Lot Help from another person to put on and taking off regular upper body clothing?: A Little Help from another person to put on and taking off regular lower body clothing?: A Lot 6 Click Score: 16    End of Session    OT Visit Diagnosis: Unsteadiness on feet (R26.81);Muscle weakness (generalized) (M62.81)   Activity Tolerance Patient tolerated treatment well   Patient Left in chair;with call bell/phone within reach   Nurse Communication          Time: [phone] OT Time Calculation (min): 33 min  Charges: OT General Charges $OT Visit: 1 Visit OT Treatments $Self Care/Home Management : 23-37 mins  Cleta Alberts, OTR/L Acute Rehabilitation Services Office: [phone]   Malka So 09/07/2022, 9:32 AM

## 2022-09-07 NOTE — Addendum Note (Signed)
Addended by: Ames Coupe F on: 09/07/2022 12:03 PM   Modules accepted: Level of Service

## 2022-09-07 NOTE — Telephone Encounter (Signed)
Patient's girlfriend is calling to make physician aware that he is currently hospitalized @[facility] , reason for missing yesterday's appointment.

## 2022-09-07 NOTE — Consult Note (Signed)
Reason for Consult:Hematochezia on Eliquis Referring Physician: Teaching Service  Andrew Brandt HPI: [name] with a PMH of ESRD s/p transplantation 01/2019, DM, prostate cancer s/p prostectomy, and colonic polyps admitted for weakness, cough, diarrhea, and poor PO intake.  The patient was diagnosed with COVID and he was treated conservatively.  He did not have a drop in his saturations.  New onset afib was identified and the etiology was unclear.  Oral anticoagulation was initiated and he was progressing well with his hospitalization, however, today he was noted to have hematochezia.  His HGB remained stable.  Over this time period he was reported to have diarrhea, presumably from his COVID infection.  He had a colonoscopy in 2016 and eight adenomas were removed.  A repeat evaluation was attempted in 2019, but it was abandoned as he had solid stool.  With his 2016 colonoscopy he prepped poorly also, but it was able to be lavaged for evaluation.  Many attempts in late 2020 were made to have him undergo a repeat colonoscopy, but he never committed.  Past Medical History:  Diagnosis Date   Cancer [facility]) 2017   prostate cancer   Chronic kidney disease    Dialysis Tues-Thurs.[facility] [place]   Constipation    Diabetes mellitus ([place])    Headache    occ   History of kidney transplant    Hypertension    Neck pain    pulled muscle in neck on cyclobenzaprine prn    Past Surgical History:  Procedure Laterality Date   AV FISTULA PLACEMENT Left 11/23/2013   Procedure: ARTERIOVENOUS (AV) FISTULA CREATION VS. GRAFT;  Surgeon: Rosetta Posner, MD;  Location: [place];  Service: Vascular;  Laterality: Left;   AV FISTULA PLACEMENT Left 07/12/2017   Procedure: INSERTION OF ARTERIOVENOUS (AV) GORE-TEX GRAFT ARM LEFT UPPER ARM;  Surgeon: Elam Dutch, MD;  Location: [facility] OR;  Service: Vascular;  Laterality: Left;   AV FISTULA PLACEMENT Right 10/21/2017   Procedure: ARTERIOVENOUS  ARM (AV) FISTULA CREATION;  Surgeon: Elam Dutch, MD;  Location: [place];  Service: Vascular;  Laterality: Right;   COLONOSCOPY WITH PROPOFOL N/A 03/25/2015   Procedure: COLONOSCOPY WITH PROPOFOL;  Surgeon: Carol Ada, MD;  Location: [facility];  Service: Endoscopy;  Laterality: N/A;   COLONOSCOPY WITH PROPOFOL N/A 05/09/2018   Procedure: COLONOSCOPY WITH PROPOFOL;  Surgeon: Carol Ada, MD;  Location: [facility];  Service: Endoscopy;  Laterality: N/A;  aborted due to prep   INSERTION OF DIALYSIS CATHETER N/A 11/23/2013   Procedure: INSERTION OF DIALYSIS CATHETER RIGHT INTERNAL JUGULAR VEIN;  Surgeon: Rosetta Posner, MD;  Location[facility] OR;  Service: Vascular;  Laterality: N/A;   PROSTATECTOMY     THROMBECTOMY AND REVISION OF ARTERIOVENTOUS (AV) GORETEX  GRAFT Left 09/02/2017   Procedure: THROMBECTOMY  OF Left Arm  ARTERIOVENTOUS (AV) GORETEX  GRAFT;  Surgeon: Angelia Mould, MD;  Location: [facility] OR;  Service: Vascular;  Laterality: Left;    Family History  Problem Relation Age of Onset   Cancer Mother    Diabetes Other        grandparents    Social History:  reports that he has quit smoking. His smoking use included cigarettes. He has never used smokeless tobacco. He reports current alcohol use. He reports that he does not use drugs.  Allergies: No Known Allergies  Medications: Scheduled:  Chlorhexidine Gluconate Cloth  6 each Topical Daily   feeding supplement  237  mL Oral BID BM   guaiFENesin  600 mg Oral BID   insulin aspart  0-15 Units Subcutaneous TID WC   ipratropium-albuterol  3 mL Nebulization Q4H   metoprolol tartrate  50 mg Oral BID   mycophenolate  180 mg Oral BID   predniSONE  5 mg Oral Q breakfast   sulfamethoxazole-trimethoprim  1 tablet Oral Q M,W,F   Continuous:  lactated ringers 100 mL/hr at 09/07/22 1756    Results for orders placed or performed during the hospital encounter of 09/18/2022 (from the past 24 hour(s))  Glucose, capillary     Status:  Abnormal   Collection Time: 09/06/22  8:53 PM  Result Value Ref Range   Glucose-Capillary 106 (H) 70 - 99 mg/dL  CBC     Status: Abnormal   Collection Time: 09/07/22  3:35 AM  Result Value Ref Range   WBC 5.2 4.0 - 10.5 K/uL   RBC 3.48 (L) 4.22 - 5.81 MIL/uL   Hemoglobin 9.8 (L) 13.0 - 17.0 g/dL   HCT 31.3 (L) 39.0 - 52.0 %   MCV 89.9 80.0 - 100.0 fL   MCH 28.2 26.0 - 34.0 pg   MCHC 31.3 30.0 - 36.0 g/dL   RDW 18.6 (H) 11.5 - 15.5 %   Platelets 153 150 - 400 K/uL   nRBC 0.0 0.0 - 0.2 %  Comprehensive metabolic panel     Status: Abnormal   Collection Time: 09/07/22  3:35 AM  Result Value Ref Range   Sodium 138 135 - 145 mmol/L   Potassium 4.5 3.5 - 5.1 mmol/L   Chloride 103 98 - 111 mmol/L   CO2 22 22 - 32 mmol/L   Glucose, Bld 91 70 - 99 mg/dL   BUN 26 (H) 8 - 23 mg/dL   Creatinine, Ser 1.75 (H) 0.61 - 1.24 mg/dL   Calcium 8.9 8.9 - 10.3 mg/dL   Total Protein 5.5 (L) 6.5 - 8.1 g/dL   Albumin 2.8 (L) 3.5 - 5.0 g/dL   AST 17 15 - 41 U/L   ALT 8 0 - 44 U/L   Alkaline Phosphatase 36 (L) 38 - 126 U/L   Total Bilirubin 0.7 0.3 - 1.2 mg/dL   GFR, Estimated 41 (L) >60 mL/min   Anion gap 13 5 - 15  Glucose, capillary     Status: None   Collection Time: 09/07/22  7:29 AM  Result Value Ref Range   Glucose-Capillary 82 70 - 99 mg/dL  Glucose, capillary     Status: Abnormal   Collection Time: 09/07/22 12:08 PM  Result Value Ref Range   Glucose-Capillary 114 (H) 70 - 99 mg/dL  CBC     Status: Abnormal   Collection Time: 09/07/22  3:11 PM  Result Value Ref Range   WBC 5.0 4.0 - 10.5 K/uL   RBC 3.41 (L) 4.22 - 5.81 MIL/uL   Hemoglobin 9.2 (L) 13.0 - 17.0 g/dL   HCT 30.5 (L) 39.0 - 52.0 %   MCV 89.4 80.0 - 100.0 fL   MCH 27.0 26.0 - 34.0 pg   MCHC 30.2 30.0 - 36.0 g/dL   RDW 18.6 (H) 11.5 - 15.5 %   Platelets 144 (L) 150 - 400 K/uL   nRBC 0.0 0.0 - 0.2 %  Glucose, capillary     Status: Abnormal   Collection Time: 09/07/22  5:13 PM  Result Value Ref Range   Glucose-Capillary  110 (H) 70 - 99 mg/dL     No results found.  ROS:  As stated above in the HPI otherwise negative.  Blood pressure 132/81, pulse 93, temperature 98.2 F (36.8 C), resp. rate 17, height 5' 9"$  (1.753 m), weight 104.3 kg, SpO2 98 %.    PE: Gen: NAD, very weak, listless, moaning quietly HEENT:  [place] Brand[place]/AT, EOMI Neck: Supple, no LAD Lungs: CTA Bilaterally CV: RRR without M/G/R ABD: Soft, NTND, +BS Ext: No C/C/E  Assessment/Plan: 1) Hematochezia. 2) Anemia - currently baseline. 3) New onset afib. 4) S/p renal transplant requiring belatacept monthly.   He requires further evaluation with a colonoscopy, but he is too weak to have the procedure.  Currently he is lying in bed moaning softly, but he is not in pain.  His wife reports that he is very weak.  In this current state he is in no condition to perform a colonoscopy.  Also, in order to perform the procedure he needs to be very diligent with taking the prep and it is unlikely that he will be able to accomplish this task.  He will be monitored over the weekend by Dr. Candis Schatz to determine his candidacy for a colonoscopy.  If there is a prolonged wait for the colonoscopy, the risk:benefit ratio favors resuming anticoagulation.  Another significant concern is his monthly treatment with belatacept at [facility]  It will be best to confer with [facility] to determine the best course of action with this medication.  Plan: 1) Resume anticoagulation if there is a prolonged wait for the colonoscopy. 2) Follow HGB and transfuse as necessary. 3) Determine the next step with his belatacept by conferring with [facility] and/or [facility] 4) Dr. Candis Schatz will follow the patient this weekend.  Taira Knabe D 09/07/2022, 5:52 PM

## 2022-09-07 NOTE — Progress Notes (Addendum)
Progress Note  Patient Name: Andrew Brandt Date of Encounter: 09/07/2022  Primary Cardiologist: Sanda Klein, MD  Subjective   Feeling tired. Cough improved. Had bout of diarrhea last night but none yet today.  Inpatient Medications    Scheduled Meds:  apixaban  5 mg Oral BID   Chlorhexidine Gluconate Cloth  6 each Topical Daily   feeding supplement  237 mL Oral BID BM   guaiFENesin  600 mg Oral BID   insulin aspart  0-15 Units Subcutaneous TID WC   ipratropium-albuterol  3 mL Nebulization Q4H   metoprolol tartrate  50 mg Oral BID   mycophenolate  180 mg Oral BID   predniSONE  5 mg Oral Q breakfast   sulfamethoxazole-trimethoprim  1 tablet Oral Q M,W,F   Continuous Infusions:  PRN Meds: acetaminophen **OR** acetaminophen, benzonatate, ondansetron **OR** ondansetron (ZOFRAN) IV, senna-docusate   Vital Signs    Vitals:   09/06/22 2130 09/07/22 0133 09/07/22 0512 09/07/22 0801  BP: (!) 147/85 128/81 120/84 (!) 141/78  Pulse: 100 100 97 78  Resp:   18 19  Temp: (!) 97.5 F (36.4 C) 98.1 F (36.7 C) 98.2 F (36.8 C) 97.7 F (36.5 C)  TempSrc: Oral Oral Oral Oral  SpO2:  99% 97% 96%  Weight:      Height:        Intake/Output Summary (Last 24 hours) at 09/07/2022 1127 Last data filed at 09/07/2022 0800 Gross per 24 hour  Intake 480 ml  Output 450 ml  Net 30 ml      09/03/2022    8:47 AM 04/30/2022    8:23 AM 05/09/2018   11:05 AM  Last 3 Weights  Weight (lbs) 230 lb 257 lb 8 oz 199 lb 15.3 oz  Weight (kg) 104.327 kg 116.801 kg 90.7 kg     Telemetry    afib - Personally Reviewed  ECG    No new tracings - Personally Reviewed  Physical Exam   GEN: No acute distress.  HEENT: Normocephalic, atraumatic, sclera non-icteric. Asymmetric mouth shape, chronic per girlfriend. Seen every day. Neck: No JVD or bruits. Cardiac: Irregularly irregular, rate borderline up, no murmurs, rubs, or gallops.  Respiratory: Clear to auscultation bilaterally. Breathing  is unlabored. GI: Soft, nontender, non-distended, BS +x 4. MS: no deformity. Extremities: No clubbing or cyanosis. Trace BLE ankle edema. Distal pedal pulses are 2+ and equal bilaterally. Neuro:  AAOx3. Follows commands. Psych:  Responds to questions appropriately with a normal affect.  Labs    High Sensitivity Troponin:  No results for input(s): "TROPONINIHS" in the last 720 hours.    Cardiac EnzymesNo results for input(s): "TROPONINI" in the last 168 hours. No results for input(s): "TROPIPOC" in the last 168 hours.   Chemistry Recent Labs  Lab 09/13/2022 0919 09/05/22 0036 09/06/22 0314 09/07/22 0335  NA 133* 136 138 138  K 4.5 3.2* 3.3* 4.5  CL 92* 99 101 103  CO2 24 23 22 22  $ GLUCOSE 135* 91 93 91  BUN 48* 42* 32* 26*  CREATININE 2.74* 2.36* 1.99* 1.75*  CALCIUM 8.7* 8.4* 8.5* 8.9  PROT 6.3*  --   --  5.5*  ALBUMIN 3.2* 2.9*  --  2.8*  AST 21  --   --  17  ALT 9  --   --  8  ALKPHOS 37*  --   --  36*  BILITOT 1.4*  --   --  0.7  GFRNONAA 24* 28* 35* 41*  ANIONGAP 17* 14  15 13     Hematology Recent Labs  Lab 09/05/22 0035 09/06/22 0314 09/07/22 0335  WBC 5.2 4.5 5.2  RBC 3.15* 3.08* 3.48*  HGB 8.7* 8.6* 9.8*  HCT 27.5* 27.4* 31.3*  MCV 87.3 89.0 89.9  MCH 27.6 27.9 28.2  MCHC 31.6 31.4 31.3  RDW 18.5* 18.5* 18.6*  PLT 151 137* 153    BNPNo results for input(s): "BNP", "PROBNP" in the last 168 hours.   DDimer No results for input(s): "DDIMER" in the last 168 hours.   Radiology    ECHOCARDIOGRAM COMPLETE  Result Date: 09/05/2022    ECHOCARDIOGRAM REPORT   Patient Name:   Andrew Brandt Date of Exam: 09/05/2022 Medical Rec #:  [id number]        Height:       69.0 in Accession #:    [id number]       Weight:       230.0 lb Date of Birth:  October 28, 1950        BSA:          2.192 m Patient Age:    26 years         BP:           108/72 mmHg Patient Gender: M                HR:           94 bpm. Exam Location:  Inpatient Procedure: 2D Echo, Cardiac Doppler and  Color Doppler Indications:    I48.91* Unspeicified atrial fibrillation  History:        Patient has no prior history of Echocardiogram examinations.                 Risk Factors:Diabetes and Hypertension.  Sonographer:    Phineas Douglas Referring Phys: 2 [place]  1. Left ventricular ejection fraction, by estimation, is 60 to 65%. The left ventricle has normal function. The left ventricle has no regional wall motion abnormalities. There is mild left ventricular hypertrophy of the basal-septal segment. Left ventricular diastolic function could not be evaluated.  2. Right ventricular systolic function is normal. The right ventricular size is normal.  3. Left atrial size was mildly dilated.  4. The mitral valve is normal in structure. Trivial mitral valve regurgitation. No evidence of mitral stenosis.  5. The aortic valve is tricuspid. Aortic valve regurgitation is not visualized. Aortic valve sclerosis is present, with no evidence of aortic valve stenosis. FINDINGS  Left Ventricle: Left ventricular ejection fraction, by estimation, is 60 to 65%. The left ventricle has normal function. The left ventricle has no regional wall motion abnormalities. The left ventricular internal cavity size was normal in size. There is  mild left ventricular hypertrophy of the basal-septal segment. Left ventricular diastolic function could not be evaluated due to atrial fibrillation. Left ventricular diastolic function could not be evaluated. Right Ventricle: The right ventricular size is normal. No increase in right ventricular wall thickness. Right ventricular systolic function is normal. Left Atrium: Left atrial size was mildly dilated. Right Atrium: Right atrial size was normal in size. Pericardium: There is no evidence of pericardial effusion. Mitral Valve: The mitral valve is normal in structure. Trivial mitral valve regurgitation. No evidence of mitral valve stenosis. Tricuspid Valve: The tricuspid valve is normal  in structure. Tricuspid valve regurgitation is trivial. No evidence of tricuspid stenosis. Aortic Valve: The aortic valve is tricuspid. Aortic valve regurgitation is not visualized. Aortic valve sclerosis is present, with  no evidence of aortic valve stenosis. Pulmonic Valve: The pulmonic valve was normal in structure. Pulmonic valve regurgitation is not visualized. No evidence of pulmonic stenosis. Aorta: The aortic root is normal in size and structure. Venous: The inferior vena cava was not well visualized. IAS/Shunts: No atrial level shunt detected by color flow Doppler.  LEFT VENTRICLE PLAX 2D LVIDd:         3.80 cm      Diastology LVIDs:         2.80 cm      LV e' medial:    9.25 cm/s LV PW:         1.00 cm      LV E/e' medial:  10.1 LV IVS:        1.30 cm      LV e' lateral:   12.60 cm/s LVOT diam:     2.10 cm      LV E/e' lateral: 7.4 LV SV:         58 LV SV Index:   26 LVOT Area:     3.46 cm  LV Volumes (MOD) LV vol d, MOD A2C: 117.0 ml LV vol d, MOD A4C: 104.0 ml LV vol s, MOD A2C: 41.3 ml LV vol s, MOD A4C: 38.6 ml LV SV MOD A2C:     75.7 ml LV SV MOD A4C:     104.0 ml LV SV MOD BP:      69.8 ml RIGHT VENTRICLE RV Basal diam:  3.60 cm RV S prime:     10.70 cm/s TAPSE (M-mode): 2.0 cm LEFT ATRIUM             Index        RIGHT ATRIUM           Index LA diam:        4.10 cm 1.87 cm/m   RA Area:     21.80 cm LA Vol (A2C):   73.4 ml 33.48 ml/m  RA Volume:   56.00 ml  25.54 ml/m LA Vol (A4C):   84.5 ml 38.54 ml/m LA Biplane Vol: 80.8 ml 36.85 ml/m  AORTIC VALVE LVOT Vmax:   84.00 cm/s LVOT Vmean:  56.100 cm/s LVOT VTI:    0.167 m  AORTA Ao Root diam: 3.10 cm Ao Asc diam:  3.30 cm MITRAL VALVE               TRICUSPID VALVE MV Area (PHT): 2.64 cm    TR Peak grad:   23.2 mmHg MV Decel Time: 287 msec    TR Vmax:        241.00 cm/s MV E velocity: 93.80 cm/s                            SHUNTS                            Systemic VTI:  0.17 m                            Systemic Diam: 2.10 cm Fransico Him MD  Electronically signed by Fransico Him MD Signature Date/Time: 09/05/2022/4:43:01 PM    Final     Cardiac Studies    2d Echo 09/05/22   1. Left ventricular ejection fraction, by estimation, is 60 to 65%. The  left ventricle has normal function. The left ventricle  has no regional  wall motion abnormalities. There is mild left ventricular hypertrophy of  the basal-septal segment. Left  ventricular diastolic function could not be evaluated.   2. Right ventricular systolic function is normal. The right ventricular  size is normal.   3. Left atrial size was mildly dilated.   4. The mitral valve is normal in structure. Trivial mitral valve  regurgitation. No evidence of mitral stenosis.   5. The aortic valve is tricuspid. Aortic valve regurgitation is not  visualized. Aortic valve sclerosis is present, with no evidence of aortic  valve stenosis.   Patient Profile     72 y.o. male with ESRD s/p subsequent kidney transplant 01/2019 followed at [facility] (subsequent CKD stage IV with Cr 2.71 in 07/2022) on chronic immunosuppressants, prostate CA s/p prostatectomy 2016, HTN, DM, prior anemia on labs who presented to the hospital with URI sx x 2 weeks, initially improved then onset of diarrhea, recurrent cough, and weakness about 1 week ago. Was orthostatic this admission, also in new onset Afib of unclear chronicity.   Assessment & Plan    1. Newly recognized atrial fibrillation - chronicity unclear but may be provoked by recent acute medical illness, fortunately asymptomatic by patient - did not receive carvedilol AM of 2/14, resumed that afternoon at lower dose 12.60m BID - still orthostatic yesterday but not as bad as before - HR still 90s-t1teens today so we will transition carvedilol to metoprolol tartrate 570mBID, first dose this afternoon - would avoid digoxin with renal disease - TSH wnl - echo reassuring - given CHADSVASC 3, started on anticoagulation - confirmed no drug/drug interactions  with transplant meds with pharmD yesterday - have arranged afib clinic f/u 2/28, appt info placed on AVS - would consider DCCV after 3 weeks of uninterrupted anticoagulation if still in afib at follow-up   2. Mild LE edema - clinically dry on presentation to the hospital - may be related to decreased albumin level, no indication for diuresis presently - echo reassuring, EF 60-65%, mild LVH of basal septal segment, mild LAE   3. Hypokalemia/hypomagnesemia - primary team managing - recommend keeping K at least 4.0 and Mg at least 2.0   Remainder of medical issues per primary team: - Covid-19 infection with diarrhea - anemia and thrombocytopenia similarly seen in 07/2022, no bleeding reported - ERSD s/p transplant with subsequent CKD stage IV - DM  Primary team inquiring whether patient may be discharged today. Await MD input.    For questions or updates, please contact [place] consult [email or web address] for contact info under Cardiology/STEMI.  Signed, DaCharlie PitterPA-C 09/07/2022, 11:27 AM    I have seen and examined the patient along with D. Dunn, PA.  I have reviewed the chart, notes and new data.  I agree with PA/NP's note.  Key new complaints: No specific cardiovascular complaints.  Cough persists but is maybe a little better. Key examination changes: Remains in atrial fibrillation with borderline ventricular rate control around 100 bpm. Key new findings / data: Echo shows absence of major structural abnormalities, but the left atrium is mildly dilated.  PLAN: Plan to continue oral anticoagulation and beta-blocker until follow-up visit in A-fib clinic 09/19/2022.  We have switched from carvedilol to metoprolol for better rate control without causing low blood pressure/minimizing orthostatic hypotension. Although rate control is not ideal, it is appropriate for a patient who is still recovering from an acute infectious illness. [place] sign off.  Medication Recommendations: Eliquis 5 mg twice daily (stop aspirin), metoprolol 50 mg twice daily (stop carvedilol) Other recommendations (labs, testing, etc): Consider for outpatient elective direct-current cardioversion after a minimum of 3 weeks of oral anticoagulation without interruption. Follow up as an outpatient: A-fib clinic appointment as scheduled September 19, 2022.   Sanda Klein, MD, [place] [phone] 09/07/2022, 1:21 PM

## 2022-09-07 NOTE — TOC Progression Note (Signed)
Discharge medications (2) are being stored in the main pharmacy on the ground floor until patient is ready for discharge.   

## 2022-09-08 DIAGNOSIS — I4891 Unspecified atrial fibrillation: Secondary | ICD-10-CM | POA: Diagnosis not present

## 2022-09-08 DIAGNOSIS — Z940 Kidney transplant status: Secondary | ICD-10-CM | POA: Diagnosis not present

## 2022-09-08 DIAGNOSIS — U071 COVID-19: Secondary | ICD-10-CM | POA: Diagnosis not present

## 2022-09-08 DIAGNOSIS — K921 Melena: Secondary | ICD-10-CM | POA: Diagnosis not present

## 2022-09-08 LAB — GLUCOSE, CAPILLARY
Glucose-Capillary: 88 mg/dL (ref 70–99)
Glucose-Capillary: 114 mg/dL — ABNORMAL HIGH (ref 70–99)
Glucose-Capillary: 145 mg/dL — ABNORMAL HIGH (ref 70–99)
Glucose-Capillary: 113 mg/dL — ABNORMAL HIGH (ref 70–99)

## 2022-09-08 LAB — COMPREHENSIVE METABOLIC PANEL
ALT: 9 U/L (ref 0–44)
AST: 14 U/L — ABNORMAL LOW (ref 15–41)
Albumin: 2.5 g/dL — ABNORMAL LOW (ref 3.5–5.0)
Alkaline Phosphatase: 29 U/L — ABNORMAL LOW (ref 38–126)
Anion gap: 12 (ref 5–15)
BUN: 26 mg/dL — ABNORMAL HIGH (ref 8–23)
CO2: 20 mmol/L — ABNORMAL LOW (ref 22–32)
Calcium: 8.5 mg/dL — ABNORMAL LOW (ref 8.9–10.3)
Chloride: 105 mmol/L (ref 98–111)
Creatinine, Ser: 1.68 mg/dL — ABNORMAL HIGH (ref 0.61–1.24)
GFR, Estimated: 43 mL/min — ABNORMAL LOW (ref >60)
Glucose, Bld: 95 mg/dL (ref 70–99)
Potassium: 3.9 mmol/L (ref 3.5–5.1)
Sodium: 137 mmol/L (ref 135–145)
Total Bilirubin: 1 mg/dL (ref 0.3–1.2)
Total Protein: 5.3 g/dL — ABNORMAL LOW (ref 6.5–8.1)

## 2022-09-08 LAB — HEMOGLOBIN AND HEMATOCRIT, BLOOD
HCT: 29.4 % — ABNORMAL LOW (ref 39.0–52.0)
Hemoglobin: 9.4 g/dL — ABNORMAL LOW (ref 13.0–17.0)

## 2022-09-08 LAB — CBC
HCT: 26 % — ABNORMAL LOW (ref 39.0–52.0)
HCT: 28.7 % — ABNORMAL LOW (ref 39.0–52.0)
Hemoglobin: 7.8 g/dL — ABNORMAL LOW (ref 13.0–17.0)
Hemoglobin: 9.3 g/dL — ABNORMAL LOW (ref 13.0–17.0)
MCH: 27.2 pg (ref 26.0–34.0)
MCH: 28.4 pg (ref 26.0–34.0)
MCHC: 30 g/dL (ref 30.0–36.0)
MCHC: 32.4 g/dL (ref 30.0–36.0)
MCV: 90.6 fL (ref 80.0–100.0)
MCV: 87.5 fL (ref 80.0–100.0)
Platelets: 146 10*3/uL — ABNORMAL LOW (ref 150–400)
Platelets: 139 10*3/uL — ABNORMAL LOW (ref 150–400)
RBC: 2.87 MIL/uL — ABNORMAL LOW (ref 4.22–5.81)
RBC: 3.28 MIL/uL — ABNORMAL LOW (ref 4.22–5.81)
RDW: 18.7 % — ABNORMAL HIGH (ref 11.5–15.5)
RDW: 18.2 % — ABNORMAL HIGH (ref 11.5–15.5)
WBC: 4.8 10*3/uL (ref 4.0–10.5)
WBC: 5.5 10*3/uL (ref 4.0–10.5)
nRBC: 0.4 % — ABNORMAL HIGH (ref 0.0–0.2)
nRBC: 0.4 % — ABNORMAL HIGH (ref 0.0–0.2)

## 2022-09-08 LAB — PREPARE RBC (CROSSMATCH)

## 2022-09-08 MED ORDER — IPRATROPIUM-ALBUTEROL 0.5-2.5 (3) MG/3ML IN SOLN
3.0000 mL | Freq: Two times a day (BID) | RESPIRATORY_TRACT | Status: DC
  Administered 2022-09-08 – 2022-09-10 (×5): 3 mL via RESPIRATORY_TRACT
  Filled 2022-09-08 (×4): qty 3

## 2022-09-08 MED ORDER — ORAL CARE MOUTH RINSE: 15.0000 mL | OROMUCOSAL | Status: DC | PRN

## 2022-09-08 MED ORDER — LACTATED RINGERS IV SOLN: INTRAVENOUS | Status: AC

## 2022-09-08 NOTE — Progress Notes (Signed)
Subjective:   Summary: Andrew Brandt is a 72 y.o. year old male currently admitted on the IMTS HD#3 for generalized weakness/diarrhea with COVID.  Overnight Events:  Several large bloody bowel movements overnight with clots. Hgb downtrended by about 2g (9.8 >7.8) since yesterday, given 1u pRBC with improvement to 9.4.  States that he did have some bowel movements overnight but does not remember what color they were, did not look. He feels okay right now aside from feeling weak, not having any stomach pain or N/V. Not feeling hungry at this time but states will try to eat later.    Objective:  Vital signs in last 24 hours: Vitals:   09/08/22 0405 09/08/22 0639 09/08/22 0742 09/08/22 1000  BP: 125/72 138/80  128/87  Pulse: (!) 106 (!) 101  94  Resp:  17    Temp: 98.3 F (36.8 C) 98.5 F (36.9 C)  98.4 F (36.9 C)  TempSrc: Oral Oral  Oral  SpO2: 100% 97% 95% 95%  Weight:      Height:       Supplemental O2: Room Air SpO2: 95 %   Physical Exam:   General: chronically ill-appearing elderly male, laying in bed, NAD. CV: normal rate, irregularly irregular rhythm, no m/r/g. Pulm: normal WOB on RA, no obvious wheezing or rales noted. Abdomen: soft, nondistended, nontender to palpation. Normal BS on my exam. MSK: trace pitting edema in BLE. Skin: warm and dry. Psych: normal mood.   Filed Weights   09/01/2022 0847  Weight: 104.3 kg     Intake/Output Summary (Last 24 hours) at 09/08/2022 1453 Last data filed at 09/08/2022 0900 Gross per 24 hour  Intake 1978.99 ml  Output 600 ml  Net 1378.99 ml   Net IO Since Admission: 5,062.72 mL [09/08/22 1453]  Pertinent Labs:    Latest Ref Rng & Units 09/08/2022    9:41 AM 09/08/2022    1:52 AM 09/07/2022    9:45 PM  CBC  WBC 4.0 - 10.5 K/uL  4.8  5.2   Hemoglobin 13.0 - 17.0 g/dL 9.4  7.8  8.4   Hematocrit 39.0 - 52.0 % 29.4  26.0  28.2   Platelets 150 - 400 K/uL  146  150        Latest Ref Rng & Units  09/08/2022    1:52 AM 09/07/2022    3:35 AM 09/06/2022    3:14 AM  CMP  Glucose 70 - 99 mg/dL 95  91  93   BUN 8 - 23 mg/dL 26  26  32   Creatinine 0.61 - 1.24 mg/dL 1.68  1.75  1.99   Sodium 135 - 145 mmol/L 137  138  138   Potassium 3.5 - 5.1 mmol/L 3.9  4.5  3.3   Chloride 98 - 111 mmol/L 105  103  101   CO2 22 - 32 mmol/L 20  22  22   $ Calcium 8.9 - 10.3 mg/dL 8.5  8.9  8.5   Total Protein 6.5 - 8.1 g/dL 5.3  5.5    Total Bilirubin 0.3 - 1.2 mg/dL 1.0  0.7    Alkaline Phos 38 - 126 U/L 29  36    AST 15 - 41 U/L 14  17    ALT 0 - 44 U/L 9  8       Imaging: No results found.   EKG: My EKG interpretation  is as follows: A-fib with controlled rate  Assessment/Plan:   Principal Problem:   COVID-19 virus infection   Patient Summary: Mr. Andrew Brandt is a 72 year old with a history of ESRD s/p renal transplant on immunosuppression, prostate cancer s/p prostatectomy, T2DM and hypertension who presents via EMS for generalized weakness, diarrhea and poor appetite and found to have COVID-19 infection.    #Painless hematochezia #Acute blood loss anemia #Hx of diverticulosis Patient with hematochezia after starting eliquis yesterday. Precipitous blood loss with downtrend in hgb by 2g, requiring 1u pRBC overnight with improvement to hgb 9.4. GI evaluated patient, believe that this is likely a diverticular hemorrhage. He has a redundant colon with excessive looping along with a history of difficult bowel prep, making direct visualization difficult. GI recommending supportive care, trend Hgb, transfuse as needed. Also recommended to hold eliquis while ensuring hgb remains stable. No plan for colonoscopy at this time. -Greatly appreciate GI assistance and recommendations -trend CBC BID, transfuse for hgb <7 and/or hemodynamic instability -hold eliquis -maintenance IVF given acute blood loss -no plan for colonoscopy at this time   #COVID-19 infection #Generalized weakness #Diarrhea Still  feeling somewhat weak, but otherwise stable from a respiratory standpoint and remains afebrile without leukocytosis. Weakness likely worsened due to above. GI panel still not yet collected. -Follow-up GI panel -O2 supplementation as needed -Incentive spirometer, pulm toilet -Mucinex 600 mg twice daily -HH PT   #ESRD s/p renal transplant #AKI on CKD4? Kidney function remains stable and slightly improved. Continuing maintenance IVF given acute blood loss. Will unfortunately need to delay belatacept given complicated course. Girlfriend will try to schedule for this next week if he is stable by that time. -Mycophenolate 180 mg twice daily -Prednisone 5 mg daily -Bactrim 400-80 mg every Monday, Wednesday and Friday -Make sure he has belatacept scheduled next week.   #New onset paroxysmal A-fib He remains in A-fib. Eliquis 5 mg twice daily started given he is CHA2DS2-VASc 3.  Cardiology will hold off on cardioversion given uncertain timeframe until his A-fib follow-up later this month. Coreg 12.76m BID changed to metoprolol tartrate 555mBID for better rate control and because of orthostasis with coreg. Holding eliquis now in setting of new GI bleed. - Appreciate cardiology recs.  He should follow-up with cardiology later this month. - lopressor 5023mID - Trend mag, BMP, replete as needed  #HTN Regimen changed to metoprolol tartate 58m79mD as above.   #T2DM Per spouse, patient was recently diagnosed with diabetes and started on metformin 500 mg twice daily. A1c 7.0 6 months ago, 6.1 here. -SSI w/ meals, CBG monitoring -Hold metformin  Diet: Heart Healthy IVF: LR 100 cc/hr VTE: held in setting of gi bleed. Code: Full PT/OT recs: Home health, needs reassessment TOC recs:  Family Update:   Dispo: Anticipated discharge pending clinical stability and ongoing workup  Andrew Brandt PGY-3 Internal Medicine Resident Please contact the on call pager after 5 pm and on weekends at  [phone]

## 2022-09-08 NOTE — Progress Notes (Signed)
Patient has had 4 large dark maroon incontinent bowel movements with multiple clots between 1900 to 0645.  He is AFIB on telemetry - at times HR goes as high as 140s to 150s.  His VS remain stable otherwise.  Dr. Markus Jarvis kept updated during the night.  HGB continued to drop - @1511$  - HGB 9.2  - @2145$  - HGB 8.4 - @0152$  - HGB 7.8.  Per Dr. Markus Jarvis, 1 Unit PRBC given.  Repeat H & H scheduled for 0839.  We are to alert MD if HR sustains greater than 140 or if patient deteriorates.  Will continue to monitor patient.  Earleen Reaper RN

## 2022-09-08 NOTE — Progress Notes (Signed)
[facility] ROUNDING NOTE   Subjective: Patient is not a good historian and is not able to tell me what his stool looks like or how often he is going, but nursing reports indicate 4 large maroon colored bowel movements overnight.  His girlfriend is present with him in the room and states that he has not had a bowel movement in the past few hours.  He received 1 unit pRBC overnight with hgb improving from 7.8 to 9.4. He has no appetite but denies post-prandial abdominal pain, nausea or vomiting or early satiety.  Objective: Vital signs in last 24 hours: Temp:  [97.5 F (36.4 C)-98.5 F (36.9 C)] 98.4 F (36.9 C) (02/17 1000) Pulse Rate:  [78-106] 94 (02/17 1000) Resp:  [16-21] 17 (02/17 0639) BP: (120-142)/(72-87) 128/87 (02/17 1000) SpO2:  [92 %-100 %] 95 % (02/17 1000) Last BM Date : 09/07/22 General: NAD, weak, deconditioned Af Am male Lungs:  CTA b/l, no w/r/r Heart:  Irregular rhythm no m/r/g Abdomen:  Obese, soft, NT, ND, +BS Ext:  1+ pitting edema b/l    Intake/Output from previous day: 02/16 0701 - 02/17 0700 In: 2219 [P.O.:680; I.V.:1201; Blood:338] Out: 600 [Urine:600] Intake/Output this shift: Total I/O In: 240 [P.O.:240] Out: -    Lab Results: Recent Labs    09/07/22 1511 09/07/22 2145 09/08/22 0152 09/08/22 0941  WBC 5.0 5.2 4.8  --   HGB 9.2* 8.4* 7.8* 9.4*  PLT 144* 150 146*  --   MCV 89.4 91.6 90.6  --    BMET Recent Labs    09/06/22 0314 09/07/22 0335 09/08/22 0152  NA 138 138 137  K 3.3* 4.5 3.9  CL 101 103 105  CO2 22 22 20*  GLUCOSE 93 91 95  BUN 32* 26* 26*  CREATININE 1.99* 1.75* 1.68*  CALCIUM 8.5* 8.9 8.5*   LFT Recent Labs    09/07/22 0335 09/08/22 0152  PROT 5.5* 5.3*  ALBUMIN 2.8* 2.5*  AST 17 14*  ALT 8 9  ALKPHOS 36* 29*  BILITOT 0.7 1.0   PT/INR No results for input(s): "INR" in the last 72 hours.    Imaging/Other results: No results found.    Assessment and Plan:  [place] weekend coverage  for Dr. Benson Norway patient  72 year old male with a PMH of ESRD s/p transplantation 01/2019, DM, prostate cancer s/p prostectomy, and colonic polyps admitted for weakness, cough, diarrhea, and poor PO intake, found to have new onset a-fib and started on Eliquis with subsequent development of multiple large bloody bowel movements with associated drop in hemoglobin after initially remaining stable. He has a history of diverticulosis noted on previous colonoscopies.  He also has a history of poor bowel preps on two prior occasions and noted technical difficulty due to a redundant colon with excessive looping.   CT-A/IR embolization relatively contraindicated due to patient's renal disease.  Colonoscopy can certainly be considered for endoscopic hemostasis, but likelihood of successfully finding and treating diverticular bleed is low at baseline, and will likely be even lower in this patient with a history of poor prep and poor functional status and PO intake who likely have difficulty completing a bowel prep.  An NG tube would likely be needed for a successful bowel prep, and would likely take more than one day.  As most diverticular bleeds spontaneously cease, and the patient is maintaining good blood pressures and responding to transfusion,  I would recommended conservative management at this time  Painless hematochezia, likely diverticular hemorrhage -  Continue supportive care, transfuse as needed - Hold Eliquis - No plan for colonoscopy at this time  New onset A-fib - Holding Eliquis given active GI bleed - Rate control per hospitalist  S/p kidney transplant Defer to Hospitalist service on obtaining belatacept during this admission  COVID infection - Diarrhea, cough symptoms seem to be improving per patient and girlfriend    [name], MD  09/08/2022, 11:36 AM [facility]

## 2022-09-08 NOTE — Progress Notes (Addendum)
IMTS paged by RN about bloody bowel movements. STAT CBC showed drop in hemoglobin from 9.2 to 8.4 after first bloody bowel movement. Vitals were stable. Patient had another large bloody bowel movement. AM CBC showed hemoglobin 7.8 after the two episodes. Transfuse 1 unit PRBC. Notified day team and order for post H&H in place.

## 2022-09-09 DIAGNOSIS — K5731 Diverticulosis of large intestine without perforation or abscess with bleeding: Secondary | ICD-10-CM | POA: Diagnosis not present

## 2022-09-09 DIAGNOSIS — U071 COVID-19: Secondary | ICD-10-CM | POA: Diagnosis not present

## 2022-09-09 DIAGNOSIS — I4891 Unspecified atrial fibrillation: Secondary | ICD-10-CM | POA: Diagnosis not present

## 2022-09-09 LAB — TYPE AND SCREEN
ABO/RH(D): O POS
Antibody Screen: NEGATIVE
Unit division: 0

## 2022-09-09 LAB — GLUCOSE, CAPILLARY
Glucose-Capillary: 89 mg/dL (ref 70–99)
Glucose-Capillary: 101 mg/dL — ABNORMAL HIGH (ref 70–99)
Glucose-Capillary: 139 mg/dL — ABNORMAL HIGH (ref 70–99)
Glucose-Capillary: 116 mg/dL — ABNORMAL HIGH (ref 70–99)

## 2022-09-09 LAB — BASIC METABOLIC PANEL
Anion gap: 11 (ref 5–15)
BUN: 23 mg/dL (ref 8–23)
CO2: 23 mmol/L (ref 22–32)
Calcium: 9.2 mg/dL (ref 8.9–10.3)
Chloride: 104 mmol/L (ref 98–111)
Creatinine, Ser: 1.54 mg/dL — ABNORMAL HIGH (ref 0.61–1.24)
GFR, Estimated: 47 mL/min — ABNORMAL LOW (ref >60)
Glucose, Bld: 98 mg/dL (ref 70–99)
Potassium: 3.6 mmol/L (ref 3.5–5.1)
Sodium: 138 mmol/L (ref 135–145)

## 2022-09-09 LAB — CBC
HCT: 28.5 % — ABNORMAL LOW (ref 39.0–52.0)
Hemoglobin: 9.1 g/dL — ABNORMAL LOW (ref 13.0–17.0)
MCH: 28.3 pg (ref 26.0–34.0)
MCHC: 31.9 g/dL (ref 30.0–36.0)
MCV: 88.5 fL (ref 80.0–100.0)
Platelets: 149 10*3/uL — ABNORMAL LOW (ref 150–400)
RBC: 3.22 MIL/uL — ABNORMAL LOW (ref 4.22–5.81)
RDW: 18.4 % — ABNORMAL HIGH (ref 11.5–15.5)
WBC: 4.5 10*3/uL (ref 4.0–10.5)
nRBC: 0.4 % — ABNORMAL HIGH (ref 0.0–0.2)

## 2022-09-09 LAB — BPAM RBC
Blood Product Expiration Date: 202403022359
ISSUE DATE / TIME: 202402170341
Unit Type and Rh: 5100

## 2022-09-09 NOTE — Progress Notes (Signed)
Subjective:   Summary: Andrew Brandt is a 72 y.o. year old male currently admitted on the IMTS HD#4 for generalized weakness/diarrhea with COVID.  Overnight Events:  Hgb remains stable, no further bloody bowel movements based on documentation.  GI planning on conservative management at this time, no plans for colonoscopy.  Feels good today, has not had any recurrent bloody bowel movements.  Breathing improved with nebulizer treatments over the past couple days.  Still feels somewhat weak.  Able to tolerate p.o. intake.  Has been moving around with PT.  Discussed risk/benefits of restarting Eliquis down the road, though he did not contribute much in terms of shared decision making.   Objective:  Vital signs in last 24 hours: Vitals:   09/08/22 2143 09/09/22 0604 09/09/22 0939 09/09/22 0950  BP: 129/84 133/80 130/84   Pulse: (!) 103 (!) 106 (!) 106   Resp: 16 20 20   $ Temp: 98.4 F (36.9 C) 98 F (36.7 C) 99.1 F (37.3 C)   TempSrc:  Oral Oral   SpO2: 95% 96% 92% 92%  Weight:      Height:       Supplemental O2: Room Air SpO2: 92 %   Physical Exam:  General: chronically ill-appearing elderly male, laying in bed, NAD. CV: normal rate, irregularly irregular rhythm, no m/r/g. Pulm: normal WOB on RA, no obvious wheezing or rales noted. Abdomen: soft, nondistended, nontender to palpation. Normal BS on my exam.  No clear rectal bleeding MSK: trace pitting edema in BLE. Skin: warm and dry. Psych: normal mood.   Filed Weights   09/11/2022 0847  Weight: 104.3 kg     Intake/Output Summary (Last 24 hours) at 09/09/2022 1048 Last data filed at 09/09/2022 0604 Gross per 24 hour  Intake 1147.95 ml  Output 900 ml  Net 247.95 ml   Net IO Since Admission: 5,310.67 mL [09/09/22 1048]  Pertinent Labs:    Latest Ref Rng & Units 09/09/2022    7:46 AM 09/08/2022    6:16 PM 09/08/2022    9:41 AM  CBC  WBC 4.0 - 10.5 K/uL 4.5  5.5    Hemoglobin 13.0 - 17.0 g/dL  9.1  9.3  9.4   Hematocrit 39.0 - 52.0 % 28.5  28.7  29.4   Platelets 150 - 400 K/uL 149  139         Latest Ref Rng & Units 09/09/2022    7:46 AM 09/08/2022    1:52 AM 09/07/2022    3:35 AM  CMP  Glucose 70 - 99 mg/dL 98  95  91   BUN 8 - 23 mg/dL 23  26  26   $ Creatinine 0.61 - 1.24 mg/dL 1.54  1.68  1.75   Sodium 135 - 145 mmol/L 138  137  138   Potassium 3.5 - 5.1 mmol/L 3.6  3.9  4.5   Chloride 98 - 111 mmol/L 104  105  103   CO2 22 - 32 mmol/L 23  20  22   $ Calcium 8.9 - 10.3 mg/dL 9.2  8.5  8.9   Total Protein 6.5 - 8.1 g/dL  5.3  5.5   Total Bilirubin 0.3 - 1.2 mg/dL  1.0  0.7   Alkaline Phos 38 - 126 U/L  29  36   AST 15 - 41 U/L  14  17   ALT 0 - 44 U/L  9  8      Imaging: No results found.   EKG: My EKG interpretation is as follows: A-fib with controlled rate  Assessment/Plan:   Principal Problem:   COVID-19 virus infection   Patient Summary: Andrew Brandt is a 72 year old with a history of ESRD s/p renal transplant on immunosuppression, prostate cancer s/p prostatectomy, T2DM and hypertension who presents via EMS for generalized weakness, diarrhea and poor appetite and found to have COVID-19 infection.    #Painless hematochezia #Acute blood loss anemia #Hx of diverticulosis Patient with hematochezia after starting eliquis.  Hemoglobin stable. GI recommending supportive care, trend Hgb, transfuse as needed. Also recommended to hold eliquis while ensuring hgb remains stable. No plan for colonoscopy at this time.  Will follow-up with GI to discuss timeline on restarting Eliquis. -Greatly appreciate GI assistance and recommendations -trend CBC, transfuse for hgb <7 and/or hemodynamic instability -hold eliquis -no plan for colonoscopy at this time  #COVID-19 infection #Generalized weakness #Diarrhea Still feeling somewhat weak, but otherwise stable from a respiratory standpoint and remains afebrile without leukocytosis. Weakness likely worsened due to above. GI  panel still not yet collected. -Follow-up GI panel - DuoNebs twice daily -O2 supplementation as needed -Incentive spirometer, pulm toilet -Mucinex 600 mg twice daily -HH PT   #ESRD s/p renal transplant #AKI on CKD4? Kidney function continues to improve.  Will see how he does off fluids today.  Will unfortunately need to delay belatacept given complicated course. Girlfriend will try to schedule for this next week if he is stable by that time. -Mycophenolate 180 mg twice daily -Prednisone 5 mg daily -Bactrim 400-80 mg every Monday, Wednesday and Friday -Make sure he has belatacept scheduled next week.   #New onset paroxysmal A-fib He remains in A-fib. Eliquis 5 mg twice daily started given he is CHA2DS2-VASc 3.  Cardiology will hold off on cardioversion given uncertain timeframe until his A-fib follow-up later this month.  Good rate control on metoprolol 50 twice daily. Holding eliquis now in setting of new GI bleed. - Appreciate cardiology recs.  He should follow-up with cardiology later this month. - lopressor 79m BID - Trend mag, BMP, replete as needed  #HTN Regimen changed to metoprolol tartate 5105mBID as above.   #T2DM Per spouse, patient was recently diagnosed with diabetes and started on metformin 500 mg twice daily. A1c 7.0 6 months ago, 6.1 here. -SSI w/ meals, CBG monitoring -Hold metformin  Diet: Heart Healthy IVF: None VTE: held in setting of gi bleed. Code: Full PT/OT recs: Home health, needs reassessment TOC recs:  Family Update:   Dispo: Anticipated discharge pending clinical stability and ongoing workup  SrLinus GalasMD PGY-1 Internal Medicine Resident Pager: [phone] contact the on call pager after 5 pm and on weekends at [phone]

## 2022-09-09 NOTE — Progress Notes (Signed)
[name] ROUNDING NOTE   Subjective: No bloody bowel movements in last 24-36 hrs.  Hemoglobin stable.  Patient's appetite improving slightly.  Diarrhea resolved.   Objective: Vital signs in last 24 hours: Temp:  [98 F (36.7 C)-99.1 F (37.3 C)] 98.3 F (36.8 C) (02/18 1700) Pulse Rate:  [100-106] 106 (02/18 1700) Resp:  [16-20] 18 (02/18 1700) BP: (129-139)/(80-88) 139/88 (02/18 1700) SpO2:  [92 %-96 %] 94 % (02/18 1700) Last BM Date : 09/08/22 General: NAD, weak, deconditioned [name], accompanied by girlfriend at bedside Lungs:  CTA b/l, no w/r/r Heart:  Irregular rhythm, no m/r/g Abdomen:  Soft, NT, ND, +BS Ext: 1+ b/l pitting edema     Intake/Output from previous day: 02/17 0701 - 02/18 0700 In: 1388 [P.O.:600; I.V.:788] Out: 900 [Urine:900] Intake/Output this shift: No intake/output data recorded.   Lab Results: Recent Labs    09/08/22 0152 09/08/22 0941 09/08/22 1816 09/09/22 0746  WBC 4.8  --  5.5 4.5  HGB 7.8* 9.4* 9.3* 9.1*  PLT 146*  --  139* 149*  MCV 90.6  --  87.5 88.5   BMET Recent Labs    09/07/22 0335 09/08/22 0152 09/09/22 0746  NA 138 137 138  K 4.5 3.9 3.6  CL 103 105 104  CO2 22 20* 23  GLUCOSE 91 95 98  BUN 26* 26* 23  CREATININE 1.75* 1.68* 1.54*  CALCIUM 8.9 8.5* 9.2   LFT Recent Labs    09/07/22 0335 09/08/22 0152  PROT 5.5* 5.3*  ALBUMIN 2.8* 2.5*  AST 17 14*  ALT 8 9  ALKPHOS 36* 29*  BILITOT 0.7 1.0   PT/INR No results for input(s): "INR" in the last 72 hours.    Imaging/Other results: No results found.    Assessment and Plan:  72 year old male with a PMH of ESRD s/p transplantation 01/2019, DM, prostate cancer s/p prostectomy, and colonic polyps admitted for weakness, cough, diarrhea, and poor PO intake, found to have new onset a-fib and started on Eliquis with subsequent development of multiple large bloody bowel movements with associated drop in hemoglobin after initially  remaining stable. He has a history of diverticulosis noted on previous colonoscopies.  He also has a history of poor bowel preps on two prior occasions and noted technical difficulty due to a redundant colon with excessive looping.   CT-A/IR embolization relatively contraindicated due to patient's renal disease. Colonoscopy can certainly be considered for endoscopic hemostasis, but likelihood of successfully finding and treating diverticular bleed is low at baseline, and will likely be even lower in this patient with a history of poor prep and poor functional status and PO intake who likely have difficulty completing a bowel prep.  An NG tube would likely be needed for a successful bowel prep, and would likely take more than one day.   Currently, it seems like his diverticular bleed has resolved or is slowing down significantly.  No plans for colonoscopy tomorrow.   Painless hematochezia, likely diverticular hemorrhage - Continue supportive care, transfuse as needed - Hold Eliquis today, defer to Dr. Collene Mares whether okay to restart Eliquis tomorrow - No plan for colonoscopy at this time   New onset A-fib - Holding Eliquis given active GI bleed - Rate control per hospitalist   S/p kidney transplant Defer to Hospitalist service on obtaining belatacept during this admission   COVID infection - Diarrhea, cough symptoms seem to be improving per patient and girlfriend   Dr. Collene Mares will be assuming GI coverage  for the patient tomorrow.  Defer to her if she would like to proceed with colonoscopy during this hospitalization.   [name], MD  09/09/2022, 7:50 PM [facility]

## 2022-09-10 DIAGNOSIS — R531 Weakness: Secondary | ICD-10-CM | POA: Diagnosis not present

## 2022-09-10 DIAGNOSIS — D62 Acute posthemorrhagic anemia: Secondary | ICD-10-CM | POA: Diagnosis not present

## 2022-09-10 DIAGNOSIS — U071 COVID-19: Secondary | ICD-10-CM | POA: Diagnosis not present

## 2022-09-10 DIAGNOSIS — K921 Melena: Secondary | ICD-10-CM

## 2022-09-10 DIAGNOSIS — I4891 Unspecified atrial fibrillation: Secondary | ICD-10-CM | POA: Diagnosis not present

## 2022-09-10 LAB — BASIC METABOLIC PANEL
Anion gap: 10 (ref 5–15)
BUN: 22 mg/dL (ref 8–23)
CO2: 23 mmol/L (ref 22–32)
Calcium: 9.2 mg/dL (ref 8.9–10.3)
Chloride: 105 mmol/L (ref 98–111)
Creatinine, Ser: 1.47 mg/dL — ABNORMAL HIGH (ref 0.61–1.24)
GFR, Estimated: 50 mL/min — ABNORMAL LOW (ref >60)
Glucose, Bld: 108 mg/dL — ABNORMAL HIGH (ref 70–99)
Potassium: 3.8 mmol/L (ref 3.5–5.1)
Sodium: 138 mmol/L (ref 135–145)

## 2022-09-10 LAB — GLUCOSE, CAPILLARY
Glucose-Capillary: 93 mg/dL (ref 70–99)
Glucose-Capillary: 100 mg/dL — ABNORMAL HIGH (ref 70–99)
Glucose-Capillary: 113 mg/dL — ABNORMAL HIGH (ref 70–99)
Glucose-Capillary: 122 mg/dL — ABNORMAL HIGH (ref 70–99)
Glucose-Capillary: 115 mg/dL — ABNORMAL HIGH (ref 70–99)

## 2022-09-10 LAB — CBC
HCT: 27.8 % — ABNORMAL LOW (ref 39.0–52.0)
Hemoglobin: 8.9 g/dL — ABNORMAL LOW (ref 13.0–17.0)
MCH: 28.3 pg (ref 26.0–34.0)
MCHC: 32 g/dL (ref 30.0–36.0)
MCV: 88.3 fL (ref 80.0–100.0)
Platelets: 150 10*3/uL (ref 150–400)
RBC: 3.15 MIL/uL — ABNORMAL LOW (ref 4.22–5.81)
RDW: 18.6 % — ABNORMAL HIGH (ref 11.5–15.5)
WBC: 5 10*3/uL (ref 4.0–10.5)
nRBC: 1 % — ABNORMAL HIGH (ref 0.0–0.2)

## 2022-09-10 MED ORDER — IPRATROPIUM-ALBUTEROL 0.5-2.5 (3) MG/3ML IN SOLN
3.0000 mL | RESPIRATORY_TRACT | Status: DC | PRN
  Filled 2022-09-10: qty 3

## 2022-09-10 MED ORDER — METOPROLOL TARTRATE 50 MG PO TABS
50.0000 mg | ORAL_TABLET | Freq: Three times a day (TID) | ORAL | Status: DC
  Administered 2022-09-10 – 2022-09-11 (×3): 50 mg via ORAL
  Filled 2022-09-10 (×3): qty 1

## 2022-09-10 MED ORDER — APIXABAN 5 MG PO TABS
5.0000 mg | ORAL_TABLET | Freq: Two times a day (BID) | ORAL | Status: DC
  Administered 2022-09-10: 5 mg via ORAL
  Filled 2022-09-10 (×2): qty 1

## 2022-09-10 MED ORDER — LACTATED RINGERS IV SOLN: INTRAVENOUS | Status: DC

## 2022-09-10 NOTE — TOC Transition Note (Signed)
Pt didn't go home over the week-end. Discharge meds are now stored at [facility].

## 2022-09-10 NOTE — Progress Notes (Addendum)
Occupational Therapy Treatment Patient Details Name: Andrew Brandt MRN: [id number] DOB: Sep 30, 1950 Today's Date: 09/10/2022   History of present illness Andrew Brandt is a 71 year old who presents via EMS for generalized weakness, diarrhea and poor appetite; with a history of ESRD s/p renal transplant on immunosuppression, prostate cancer s/p prostatectomy, T2DM and hypertension   OT comments  Pt making slow progress with functional goals. Pt required min A x 3 trials to power up from recliner for sit - stand transitions. Pt reports that his girlfriend will be assisting him at home, but that she will not be there 24/7. Pt ate less than 30% of his lunch today and admits to an ongoing poor appetite. Pt with no c/o of dizziness during standing,but with elevated HR in 140s-150s with minimal - moderate exertion. OT will continue to follow acutely to maximize level of function and safety   Recommendations for follow up therapy are one component of a multi-disciplinary discharge planning process, led by the attending physician.  Recommendations may be updated based on patient status, additional functional criteria and insurance authorization.    Follow Up Recommendations  Home health OT (if pt not progressing well enough, next visit, then will need ST SNF rehab)    Assistance Recommended at Discharge Frequent or constant Supervision/Assistance  Patient can return home with the following  A little help with walking and/or transfers;A lot of help with bathing/dressing/bathroom;Assistance with cooking/housework;Assist for transportation;Help with stairs or ramp for entrance   Equipment Recommendations  Tub/shower seat    Recommendations for Other Services      Precautions / Restrictions Precautions Precautions: Fall Precaution Comments: orthostatic hypotension Restrictions Weight Bearing Restrictions: No       Mobility Bed Mobility   Bed Mobility: Sit to Supine       Sit to supine:  Min assist   General bed mobility comments: pt in recliner upon arrival. Assiste dpt back ot bed min A with LEs    Transfers Overall transfer level: Needs assistance Equipment used: None Transfers:  (3 attempts from chair to power up) Sit to Stand: Min guard, From elevated surface                 Balance Overall balance assessment: Needs assistance Sitting-balance support: No upper extremity supported, Feet supported Sitting balance-Leahy Scale: Fair     Standing balance support: No upper extremity supported, During functional activity Standing balance-Leahy Scale: Fair                             ADL either performed or assessed with clinical judgement   ADL Overall ADL's : Needs assistance/impaired     Grooming: Wash/dry hands;Wash/dry face;Min guard;Sitting           Upper Body Dressing : Min guard       Toilet Transfer: Psychologist, sport and exercise Details (indicate cue type and reason): simulated Toileting- Clothing Manipulation and Hygiene: Moderate assistance;Sit to/from stand Toileting - Clothing Manipulation Details (indicate cue type and reason): clohting mgt     Functional mobility during ADLs: Min guard      Extremity/Trunk Assessment Upper Extremity Assessment Upper Extremity Assessment: Generalized weakness   Lower Extremity Assessment Lower Extremity Assessment: Defer to PT evaluation        Vision Baseline Vision/History: 1 Wears glasses Ability to See in Adequate Light: 0 Adequate Patient Visual Report: No change from baseline     Perception     Praxis  Cognition Arousal/Alertness: Awake/alert Behavior During Therapy: Flat affect Overall Cognitive Status: Impaired/Different from baseline Area of Impairment: Problem solving                             Problem Solving: Decreased initiation, Slow processing, Difficulty sequencing, Requires verbal cues          Exercises       Shoulder Instructions       General Comments      Pertinent Vitals/ Pain       Pain Assessment Pain Assessment: No/denies pain  Home Living                                          Prior Functioning/Environment              Frequency  Min 2X/week        Progress Toward Goals  OT Goals(current goals can now be found in the care plan section)  Progress towards OT goals: Progressing toward goals     Plan Discharge plan remains appropriate    Co-evaluation                 AM-PAC OT "6 Clicks" Daily Activity     Outcome Measure   Help from another person eating meals?: None Help from another person taking care of personal grooming?: A Little Help from another person toileting, which includes using toliet, bedpan, or urinal?: A Lot Help from another person bathing (including washing, rinsing, drying)?: A Lot Help from another person to put on and taking off regular upper body clothing?: A Little Help from another person to put on and taking off regular lower body clothing?: A Lot 6 Click Score: 16    End of Session Equipment Utilized During Treatment: Gait belt  OT Visit Diagnosis: Unsteadiness on feet (R26.81);Muscle weakness (generalized) (M62.81)   Activity Tolerance Patient limited by fatigue   Patient Left with call bell/phone within reach;in bed;with bed alarm set   Nurse Communication          Time: [id number] OT Time Calculation (min): 19 min  Charges: OT General Charges $OT Visit: 1 Visit OT Treatments $Therapeutic Activity: 8-22 mins   Andrew Brandt 09/10/2022, 2:14 PM

## 2022-09-10 NOTE — Progress Notes (Addendum)
Subjective:   Summary: Andrew Brandt is a 72 y.o. year old male currently admitted on the IMTS HD#5 for generalized weakness/diarrhea with COVID.  Overnight Events:  Hgb remains stable, no further bloody bowel movements based on documentation.  GI planning on conservative management at this time, no plans for colonoscopy.  Minimally tachycardic and remains hemodynamically stable on room air overnight.  Feels stable this morning.  Having minimal p.o. intake.  No abdominal pain and no further episodes of bloody bowel movements.  No nausea or emesis.  Breathing is significantly improved with nebulizer treatments.  No chest pain.   Objective:  Vital signs in last 24 hours: Vitals:   09/09/22 2125 09/09/22 2131 09/10/22 0621 09/10/22 0744  BP:  (!) 142/92 (!) 140/91   Pulse:  (!) 110 96 78  Resp:  20 20 18  $ Temp:  97.9 F (36.6 C) 98 F (36.7 C)   TempSrc:      SpO2: 94% 100% 95% 95%  Weight:      Height:       Supplemental O2: Room Air SpO2: 95 %   Physical Exam:  General: chronically ill-appearing elderly male, laying in bed, NAD. CV: normal rate, irregularly irregular rhythm, no m/r/g. Pulm: normal WOB on RA, no obvious wheezing or rales noted. Abdomen: soft, nondistended, nontender to palpation. Normal BS on my exam.  No clear rectal bleeding MSK: trace pitting edema in BLE. Skin: warm and dry. Psych: normal mood.   Filed Weights   09/15/2022 0847  Weight: 104.3 kg     Intake/Output Summary (Last 24 hours) at 09/10/2022 [id number] Last data filed at 09/10/2022 [id number] Gross per 24 hour  Intake 360 ml  Output 600 ml  Net -240 ml    Net IO Since Admission: 5,070.67 mL [09/10/22 0808]  Pertinent Labs:    Latest Ref Rng & Units 09/10/2022    3:46 AM 09/09/2022    7:46 AM 09/08/2022    6:16 PM  CBC  WBC 4.0 - 10.5 K/uL 5.0  4.5  5.5   Hemoglobin 13.0 - 17.0 g/dL 8.9  9.1  9.3   Hematocrit 39.0 - 52.0 % 27.8  28.5  28.7   Platelets 150 - 400 K/uL 150   149  139        Latest Ref Rng & Units 09/10/2022    3:46 AM 09/09/2022    7:46 AM 09/08/2022    1:52 AM  CMP  Glucose 70 - 99 mg/dL 108  98  95   BUN 8 - 23 mg/dL 22  23  26   $ Creatinine 0.61 - 1.24 mg/dL 1.47  1.54  1.68   Sodium 135 - 145 mmol/L 138  138  137   Potassium 3.5 - 5.1 mmol/L 3.8  3.6  3.9   Chloride 98 - 111 mmol/L 105  104  105   CO2 22 - 32 mmol/L 23  23  20   $ Calcium 8.9 - 10.3 mg/dL 9.2  9.2  8.5   Total Protein 6.5 - 8.1 g/dL   5.3   Total Bilirubin 0.3 - 1.2 mg/dL   1.0   Alkaline Phos 38 - 126 U/L   29   AST 15 - 41 U/L   14   ALT 0 - 44 U/L   9      Imaging: No results found.   EKG: My EKG interpretation is  as follows: A-fib in and out of RVR  Assessment/Plan:   Principal Problem:   COVID-19 virus infection   Patient Summary: [name] is a 72 year old with a history of ESRD s/p renal transplant on immunosuppression, prostate cancer s/p prostatectomy, T2DM and hypertension who presents via EMS for generalized weakness, diarrhea and poor appetite and found to have COVID-19 infection.    #Painless hematochezia #Acute blood loss anemia #Hx of diverticulosis Patient with hematochezia after starting eliquis.  Hemoglobin stable. GI recommending supportive care, trend Hgb, transfuse as needed. No plan for colonoscopy at this time.  His hemoglobin has remained stable and he has not had any further episodes of bloody bowel movements for more than 2 days now.  Will follow-up with GI to discuss timeline on restarting Eliquis. -Greatly appreciate GI assistance and recommendations -trend CBC, transfuse for hgb <7 and/or hemodynamic instability -Consider restarting eliquis today after confirming with GI -no plan for colonoscopy at this time  #COVID-19 infection #Generalized weakness #Diarrhea Still feeling weak, but otherwise stable from a respiratory standpoint and remains afebrile without leukocytosis. Weakness likely worsened due to above. GI panel  still not yet collected.  PT/OT will reevaluate, he was noted to have orthostasis and significantly elevated heart rates on exertion.  Suspect he might need SNF if he continues to have poor functionality during future PT/OT sessions. -Follow-up GI panel - DuoNebs twice daily -O2 supplementation as needed -Incentive spirometer, pulm toilet -Mucinex 600 mg twice daily -PT/OT re-eval   #ESRD s/p renal transplant #AKI on CKD4? Kidney function continues to improve.  Will unfortunately need to delay belatacept given complicated course. Girlfriend will try to schedule for this next week if he is stable by that time. -Mycophenolate 180 mg twice daily -Prednisone 5 mg daily -Bactrim 400-80 mg every Monday, Wednesday and Friday -Make sure he has belatacept scheduled next week.   #New onset paroxysmal A-fib He remains in A-fib occasionally in RVR especially during exertion. Eliquis 5 mg twice daily started given he is CHA2DS2-VASc 3.  Cardiology will hold off on cardioversion given uncertain timeframe until his A-fib follow-up later this month.  Suspect he might need more rate control. Holding eliquis in setting of GI bleed, but discussing with GI to consider restarting today. - Appreciate any further cardiology recs regarding rate control.  He should follow-up with cardiology later this month. - lopressor 70m BID - Trend mag, BMP, replete as needed  #HTN Stable on metoprolol tartate 549mBID as above.   #T2DM Per spouse, patient was recently diagnosed with diabetes and started on metformin 500 mg twice daily. A1c 7.0 6 months ago, 6.1 here. -SSI w/ meals, CBG monitoring -Hold metformin  Diet: Heart Healthy IVF: None VTE: held in setting of gi bleed. Code: Full PT/OT recs: Home health, needs reassessment TOC recs:  Family Update:   Dispo: Anticipated discharge pending clinical stability and ongoing workup  SrLinus GalasMD PGY-1 Internal Medicine Resident Pager: [phone] Please contact the on call pager after 5 pm and on weekends at [phone]

## 2022-09-10 NOTE — Progress Notes (Signed)
Progress Note  Patient Name: Andrew Brandt Date of Encounter: 09/10/2022  Primary Cardiologist:   Sanda Klein, MD   Subjective   He reports back pain.  No SOB. Not feeling palpitations.   Inpatient Medications    Scheduled Meds:  sodium chloride   Intravenous Once   apixaban  5 mg Oral BID   Chlorhexidine Gluconate Cloth  6 each Topical Daily   feeding supplement  237 mL Oral BID BM   guaiFENesin  600 mg Oral BID   insulin aspart  0-15 Units Subcutaneous TID WC   ipratropium-albuterol  3 mL Nebulization BID   metoprolol tartrate  50 mg Oral BID   mycophenolate  180 mg Oral BID   predniSONE  5 mg Oral Q breakfast   sulfamethoxazole-trimethoprim  1 tablet Oral Q M,W,F   Continuous Infusions:  PRN Meds: acetaminophen **OR** acetaminophen, benzonatate, ondansetron **OR** ondansetron (ZOFRAN) IV, mouth rinse, senna-docusate   Vital Signs    Vitals:   09/09/22 2131 09/10/22 0621 09/10/22 0744 09/10/22 0948  BP: (!) 142/92 (!) 140/91  138/89  Pulse: (!) 110 96 78 98  Resp: 20 20 18   $ Temp: 97.9 F (36.6 C) 98 F (36.7 C)  98.2 F (36.8 C)  TempSrc:      SpO2: 100% 95% 95% 96%  Weight:      Height:        Intake/Output Summary (Last 24 hours) at 09/10/2022 1607 Last data filed at 09/10/2022 1300 Gross per 24 hour  Intake 960 ml  Output 600 ml  Net 360 ml   Filed Weights   09/13/2022 0847  Weight: 104.3 kg    Telemetry    Atrial fib with occasional increased ventricular rate.  - Personally Reviewed  ECG    NA - Personally Reviewed  Physical Exam   GEN: No acute distress.   Neck: No  JVD Cardiac: Irregular RR, no murmurs, rubs, or gallops.  Respiratory: Clear  to auscultation bilaterally. GI: Soft, nontender, non-distended  MS: No  edema; No deformity. Neuro:  Nonfocal  Psych: Normal affect   Labs    Chemistry Recent Labs  Lab 09/18/2022 0919 09/05/22 0036 09/06/22 0314 09/07/22 0335 09/08/22 0152 09/09/22 0746 09/10/22 0346  NA  133* 136   < > 138 137 138 138  K 4.5 3.2*   < > 4.5 3.9 3.6 3.8  CL 92* 99   < > 103 105 104 105  CO2 24 23   < > 22 20* 23 23  GLUCOSE 135* 91   < > 91 95 98 108*  BUN 48* 42*   < > 26* 26* 23 22  CREATININE 2.74* 2.36*   < > 1.75* 1.68* 1.54* 1.47*  CALCIUM 8.7* 8.4*   < > 8.9 8.5* 9.2 9.2  PROT 6.3*  --   --  5.5* 5.3*  --   --   ALBUMIN 3.2* 2.9*  --  2.8* 2.5*  --   --   AST 21  --   --  17 14*  --   --   ALT 9  --   --  8 9  --   --   ALKPHOS 37*  --   --  36* 29*  --   --   BILITOT 1.4*  --   --  0.7 1.0  --   --   GFRNONAA 24* 28*   < > 41* 43* 47* 50*  ANIONGAP 17* 14   < >  $13 12 11 10   Z$ < > = values in this interval not displayed.     Hematology Recent Labs  Lab 09/08/22 1816 09/09/22 0746 09/10/22 0346  WBC 5.5 4.5 5.0  RBC 3.28* 3.22* 3.15*  HGB 9.3* 9.1* 8.9*  HCT 28.7* 28.5* 27.8*  MCV 87.5 88.5 88.3  MCH 28.4 28.3 28.3  MCHC 32.4 31.9 32.0  RDW 18.2* 18.4* 18.6*  PLT 139* 149* 150    Cardiac EnzymesNo results for input(s): "TROPONINI" in the last 168 hours. No results for input(s): "TROPIPOC" in the last 168 hours.   BNPNo results for input(s): "BNP", "PROBNP" in the last 168 hours.   DDimer No results for input(s): "DDIMER" in the last 168 hours.   Radiology    No results found.  Cardiac Studies   ECHO:  1. Left ventricular ejection fraction, by estimation, is 60 to 65%. The  left ventricle has normal function. The left ventricle has no regional  wall motion abnormalities. There is mild left ventricular hypertrophy of  the basal-septal segment. Left  ventricular diastolic function could not be evaluated.   2. Right ventricular systolic function is normal. The right ventricular  size is normal.   3. Left atrial size was mildly dilated.   4. The mitral valve is normal in structure. Trivial mitral valve  regurgitation. No evidence of mitral stenosis.   5. The aortic valve is tricuspid. Aortic valve regurgitation is not  visualized. Aortic  valve sclerosis is present, with no evidence of aortic  valve stenosis.    Patient Profile     72 y.o. male  with ESRD s/p subsequent kidney transplant 01/2019 followed at [facility] (subsequent CKD stage IV with Cr 2.71 in 07/2022) on chronic immunosuppressants, prostate CA s/p prostatectomy 2016, HTN, DM, prior anemia on labs who presented to the hospital with URI sx x 2 weeks, initially improved then onset of diarrhea, recurrent cough, and weakness about 1 week ago. Was orthostatic this admission, also in new onset Afib of unclear chronicity.    Assessment & Plan    Atrial fib:  Now back on Eliquis.  Rate is elevated particularly with up and moving.   This will be difficult to control.  I will increase the metoprolol to 50 mg tid from bid and we can consolidate dosing before discharge.  For questions or updates, please contact [place] Please consult [email or web address] for contact info under Cardiology/STEMI.   Signed, Minus Breeding, MD  09/10/2022, 4:07 PM

## 2022-09-10 NOTE — Progress Notes (Signed)
Physical Therapy Treatment Patient Details Name: Andrew Brandt MRN: [id number] DOB: [birth year] Today's Date: 09/10/2022   History of Present Illness Andrew Brandt is a 72 year old who presents via EMS for generalized weakness, diarrhea and poor appetite; with a history of ESRD s/p renal transplant on immunosuppression, prostate cancer s/p prostatectomy, T2DM and hypertension    PT Comments    Pt progressing towards physical therapy goals. Was able to perform transfers and minimal ambulation with gross min guard assist and no AD. RN present during session, stating a-fib causing elevated HR up into 190's. Pt mildly orthostatic but asymptomatic throughout session. Pt adamant that he will be returning home at d/c, however endorses that his girlfriend will only be intermittently available to assist. At this time, feel pt will need to demonstrate an increased tolerance for functional mobility to consider a safe d/c home. To be fair, we were limited due to elevated HR. If pt does not show improvement next session, may want to consider a higher level of care.   Orthostatic BPs  Supine 126/80  Sitting 119/74  Standing 105/71        Recommendations for follow up therapy are one component of a multi-disciplinary discharge planning process, led by the attending physician.  Recommendations may be updated based on patient status, additional functional criteria and insurance authorization.  Follow Up Recommendations  Home health PT     Assistance Recommended at Discharge Set up Supervision/Assistance  Patient can return home with the following Help with stairs or ramp for entrance;Assistance with cooking/housework;A little help with walking and/or transfers;A little help with bathing/dressing/bathroom;Assist for transportation   Equipment Recommendations  Rolling walker (2 wheels);BSC/3in1    Recommendations for Other Services       Precautions / Restrictions Precautions Precautions:  Fall Precaution Comments: orthostatic hypotension Restrictions Weight Bearing Restrictions: No     Mobility  Bed Mobility Overal bed mobility: Needs Assistance Bed Mobility: Supine to Sit     Supine to sit: Min assist     General bed mobility comments: Min assist for trunk elevation to full sitting position. Use of rails and increased time required.    Transfers Overall transfer level: Needs assistance Equipment used: None Transfers: Sit to/from Stand Sit to Stand: Min guard, From elevated surface           General transfer comment: Sit<>stand x3. Pt required min guard assist for balance support and safety.    Ambulation/Gait Ambulation/Gait assistance: Min guard Gait Distance (Feet): 5 Feet Assistive device: None Gait Pattern/deviations: Step-through pattern, Decreased stride length, Decreased step length - right, Decreased step length - left, Wide base of support Gait velocity: Decreased Gait velocity interpretation: <1.31 ft/sec, indicative of household ambulator   General Gait Details: Pt able to ambulate ~5 feet from bed to chair. Pt was able to transition to chair without UE support and no overt LOB was noted. Distance limited due to elevated HR 2 a-fib.   Stairs             Wheelchair Mobility    Modified Rankin (Stroke Patients Only)       Balance Overall balance assessment: Needs assistance Sitting-balance support: No upper extremity supported, Feet supported Sitting balance-Leahy Scale: Fair     Standing balance support: No upper extremity supported Standing balance-Leahy Scale: Fair Standing balance comment: min guard                            Cognition Arousal/Alertness:  Awake/alert Behavior During Therapy: Flat affect Overall Cognitive Status: Impaired/Different from baseline Area of Impairment: Problem solving                             Problem Solving: Decreased initiation, Slow processing, Requires  verbal cues General Comments: assist to sequence toothbrushing, decreased thoroughness with bathing        Exercises      General Comments        Pertinent Vitals/Pain Pain Assessment Pain Assessment: No/denies pain    Home Living                          Prior Function            PT Goals (current goals can now be found in the care plan section) Acute Rehab PT Goals Patient Stated Goal: Be able to return home PT Goal Formulation: With patient Time For Goal Achievement: 09/19/22 Potential to Achieve Goals: Good Progress towards PT goals: Progressing toward goals    Frequency    Min 3X/week      PT Plan Current plan remains appropriate    Co-evaluation              AM-PAC PT "6 Clicks" Mobility   Outcome Measure  Help needed turning from your back to your side while in a flat bed without using bedrails?: None Help needed moving from lying on your back to sitting on the side of a flat bed without using bedrails?: A Little Help needed moving to and from a bed to a chair (including a wheelchair)?: A Little Help needed standing up from a chair using your arms (e.g., wheelchair or bedside chair)?: A Little Help needed to walk in hospital room?: A Little Help needed climbing 3-5 steps with a railing? : A Little 6 Click Score: 19    End of Session Equipment Utilized During Treatment: Gait belt Activity Tolerance: Patient tolerated treatment well Patient left: in chair;with call bell/phone within reach;with chair alarm set Nurse Communication: Mobility status PT Visit Diagnosis: Unsteadiness on feet (R26.81);Muscle weakness (generalized) (M62.81)     Time: [id number] PT Time Calculation (min) (ACUTE ONLY): 36 min  Charges:  $Gait Training: 8-22 mins $Therapeutic Activity: 8-22 mins                     Rolinda Roan, PT, DPT Acute Rehabilitation Services Secure Chat Preferred Office: [phone]    Thelma Comp 09/10/2022, 2:56  PM

## 2022-09-10 NOTE — Progress Notes (Signed)
Pt sitting on side of bed with PT in room. PT assessing orthostatics. RN noted increased heart rate 140's-190's. Pt reports "feeling heart racing a little" Pt tolerating standing and walking to chair where he is currently sitting with current heart rate 110

## 2022-09-10 NOTE — Progress Notes (Signed)
Subjective: Patient seems to be doing slightly better today.  He denies having any BMs over the last couple of days.  Denies having nausea vomiting or abdominal pain. His girlfriend is at the bedside is concerned about slight drop in his hemoglobin to 8.9 g/dL. She claims she looks a little lethargic today but is responding appropriately to questions.  Objective: Vital signs in last 24 hours: Temp:  [97.9 F (36.6 C)-99.1 F (37.3 C)] 98 F (36.7 C) (02/19 0621) Pulse Rate:  [96-110] 96 (02/19 0621) Resp:  [18-20] 20 (02/19 0621) BP: (130-142)/(84-92) 140/91 (02/19 0621) SpO2:  [92 %-100 %] 95 % (02/19 0621) Last BM Date : 09/08/22  Intake/Output from previous day: 02/18 0701 - 02/19 0700 In: 360 [P.O.:360] Out: 600 [Urine:600] Intake/Output this shift: No intake/output data recorded.  General appearance: cooperative, appears stated age, fatigued, no distress, moderately obese, and pale Resp: clear to auscultation bilaterally Cardio: irregularly irregular rhythm, S1, S2 normal, and no rub GI: soft, non-tender; bowel sounds normal; no masses,  no organomegaly  Lab Results: Recent Labs    09/08/22 1816 09/09/22 0746 09/10/22 0346  WBC 5.5 4.5 5.0  HGB 9.3* 9.1* 8.9*  HCT 28.7* 28.5* 27.8*  PLT 139* 149* 150   BMET Recent Labs    09/08/22 0152 09/09/22 0746 09/10/22 0346  NA 137 138 138  K 3.9 3.6 3.8  CL 105 104 105  CO2 20* 23 23  GLUCOSE 95 98 108*  BUN 26* 23 22  CREATININE 1.68* 1.54* 1.47*  CALCIUM 8.5* 9.2 9.2   LFT Recent Labs    09/08/22 0152  PROT 5.3*  ALBUMIN 2.5*  AST 14*  ALT 9  ALKPHOS 29*  BILITOT 1.0   Studies/Results: No results found.  Medications: I have reviewed the patient's current medications. Prior to Admission:  Medications Prior to Admission  Medication Sig Dispense Refill Last Dose   acetaminophen (TYLENOL) 500 MG tablet Take 1,000 mg by mouth every 6 (six) hours as needed for mild pain or headache.    unk   aspirin EC  81 MG tablet Take 81 mg by mouth daily. Swallow whole.   08/28/2022   calcitRIOL (ROCALTROL) 0.25 MCG capsule Take 0.25 mcg by mouth 3 (three) times a week. M,W, F   09/03/2022   carvedilol (COREG) 25 MG tablet Take 25 mg by mouth 2 (two) times daily.   09/11/2022 at 0700   Cholecalciferol (VITAMIN D) 125 MCG (5000 UT) CAPS Take 5,000 Units by mouth daily.   09/06/2022   furosemide (LASIX) 40 MG tablet Take 40 mg by mouth 2 (two) times daily.   09/04/2022   guaiFENesin-dextromethorphan (ROBITUSSIN DM) 100-10 MG/5ML syrup Take 10 mLs by mouth every 4 (four) hours as needed for cough.   09/03/2022   metFORMIN (GLUCOPHAGE) 500 MG tablet Take 500 mg by mouth 2 (two) times daily.   08/30/2022   mycophenolate (MYFORTIC) 180 MG EC tablet Take 180 mg by mouth 2 (two) times daily.   09/12/2022   omeprazole (PRILOSEC) 20 MG capsule Take 20 mg by mouth daily.   09/03/2022   predniSONE (DELTASONE) 5 MG tablet Take 5 mg by mouth daily with breakfast.   09/07/2022   sulfamethoxazole-trimethoprim (BACTRIM) 400-80 MG tablet Take 1 tablet by mouth 3 (three) times a week. M,W,F   09/03/2022   Scheduled:  sodium chloride   Intravenous Once   apixaban  5 mg Oral BID   Chlorhexidine Gluconate Cloth  6 each Topical Daily   feeding  supplement  237 mL Oral BID BM   guaiFENesin  600 mg Oral BID   insulin aspart  0-15 Units Subcutaneous TID WC   ipratropium-albuterol  3 mL Nebulization BID   metoprolol tartrate  50 mg Oral BID   mycophenolate  180 mg Oral BID   predniSONE  5 mg Oral Q breakfast   sulfamethoxazole-trimethoprim  1 tablet Oral Q M,W,F   Continuous:  Assessment/Plan: 1) Posthemorrhagic anemia with history of diverticulosis-as per the discussion with the patient and his girlfriend plans are to resume his anticoagulation at this time and follow his hemoglobin closely. 2) status post renal transplant for end-stage renal disease on immunosuppression. 3) COVID infection. 4) AODM. 5) HTN/new onset atrial  fibrillation-please resume anticoagulation today. 6) Generalized weakness.   LOS: 5 days   Andrew Brandt 09/10/2022, 7:05 AM

## 2022-09-11 ENCOUNTER — Inpatient Hospital Stay (HOSPITAL_COMMUNITY): Payer: Medicare Other

## 2022-09-11 DIAGNOSIS — K921 Melena: Secondary | ICD-10-CM | POA: Diagnosis not present

## 2022-09-11 DIAGNOSIS — U071 COVID-19: Secondary | ICD-10-CM | POA: Diagnosis not present

## 2022-09-11 DIAGNOSIS — I4891 Unspecified atrial fibrillation: Secondary | ICD-10-CM | POA: Diagnosis not present

## 2022-09-11 DIAGNOSIS — R531 Weakness: Secondary | ICD-10-CM | POA: Diagnosis not present

## 2022-09-11 DIAGNOSIS — D62 Acute posthemorrhagic anemia: Secondary | ICD-10-CM | POA: Diagnosis not present

## 2022-09-11 LAB — BASIC METABOLIC PANEL
Anion gap: 10 (ref 5–15)
BUN: 21 mg/dL (ref 8–23)
CO2: 25 mmol/L (ref 22–32)
Calcium: 9.4 mg/dL (ref 8.9–10.3)
Chloride: 103 mmol/L (ref 98–111)
Creatinine, Ser: 1.42 mg/dL — ABNORMAL HIGH (ref 0.61–1.24)
GFR, Estimated: 52 mL/min — ABNORMAL LOW (ref >60)
Glucose, Bld: 103 mg/dL — ABNORMAL HIGH (ref 70–99)
Potassium: 4.3 mmol/L (ref 3.5–5.1)
Sodium: 138 mmol/L (ref 135–145)

## 2022-09-11 LAB — GLUCOSE, CAPILLARY
Glucose-Capillary: 91 mg/dL (ref 70–99)
Glucose-Capillary: 140 mg/dL — ABNORMAL HIGH (ref 70–99)

## 2022-09-11 LAB — CBC
HCT: 31.9 % — ABNORMAL LOW (ref 39.0–52.0)
Hemoglobin: 10.1 g/dL — ABNORMAL LOW (ref 13.0–17.0)
MCH: 27.9 pg (ref 26.0–34.0)
MCHC: 31.7 g/dL (ref 30.0–36.0)
MCV: 88.1 fL (ref 80.0–100.0)
Platelets: 150 10*3/uL (ref 150–400)
RBC: 3.62 MIL/uL — ABNORMAL LOW (ref 4.22–5.81)
RDW: 18.4 % — ABNORMAL HIGH (ref 11.5–15.5)
WBC: 5.9 10*3/uL (ref 4.0–10.5)
nRBC: 0.7 % — ABNORMAL HIGH (ref 0.0–0.2)

## 2022-09-11 LAB — HEMOGLOBIN AND HEMATOCRIT, BLOOD
HCT: 33.7 % — ABNORMAL LOW (ref 39.0–52.0)
Hemoglobin: 10.8 g/dL — ABNORMAL LOW (ref 13.0–17.0)

## 2022-09-11 MED ORDER — POLYETHYLENE GLYCOL 3350 17 G PO PACK
17.0000 g | PACK | Freq: Once | ORAL | Status: AC
  Administered 2022-09-11: 17 g via ORAL
  Filled 2022-09-11: qty 1

## 2022-09-11 MED ORDER — FLUTICASONE FUROATE-VILANTEROL 100-25 MCG/ACT IN AEPB
1.0000 | INHALATION_SPRAY | Freq: Every day | RESPIRATORY_TRACT | Status: DC
  Filled 2022-09-11: qty 28

## 2022-09-11 MED ORDER — LEVALBUTEROL HCL 0.63 MG/3ML IN NEBU
0.6300 mg | INHALATION_SOLUTION | RESPIRATORY_TRACT | Status: DC | PRN
  Administered 2022-09-11: 0.63 mg via RESPIRATORY_TRACT
  Filled 2022-09-11: qty 3

## 2022-09-11 MED ORDER — LEVALBUTEROL HCL 0.63 MG/3ML IN NEBU
0.6300 mg | INHALATION_SOLUTION | Freq: Four times a day (QID) | RESPIRATORY_TRACT | Status: DC
  Administered 2022-09-12 – 2022-09-13 (×4): 0.63 mg via RESPIRATORY_TRACT
  Filled 2022-09-11 (×4): qty 3

## 2022-09-11 MED ORDER — SODIUM CHLORIDE 0.9 % IV SOLN
2.0000 g | Freq: Two times a day (BID) | INTRAVENOUS | Status: DC
  Administered 2022-09-11 – 2022-09-15 (×6): 2 g via INTRAVENOUS
  Filled 2022-09-11 (×7): qty 12.5

## 2022-09-11 MED ORDER — LACTATED RINGERS IV SOLN: INTRAVENOUS | Status: DC

## 2022-09-11 MED ORDER — VANCOMYCIN HCL 2000 MG/400ML IV SOLN
2000.0000 mg | Freq: Once | INTRAVENOUS | Status: DC
  Filled 2022-09-11: qty 400

## 2022-09-11 MED ORDER — FUROSEMIDE 10 MG/ML IJ SOLN
40.0000 mg | Freq: Once | INTRAMUSCULAR | Status: AC
  Administered 2022-09-11: 40 mg via INTRAVENOUS
  Filled 2022-09-11: qty 4

## 2022-09-11 MED ORDER — UMECLIDINIUM BROMIDE 62.5 MCG/ACT IN AEPB
1.0000 | INHALATION_SPRAY | Freq: Every day | RESPIRATORY_TRACT | Status: DC
  Filled 2022-09-11: qty 7

## 2022-09-11 MED ORDER — METOPROLOL TARTRATE 50 MG PO TABS: 50.0000 mg | ORAL_TABLET | Freq: Three times a day (TID) | ORAL | Status: DC

## 2022-09-11 MED ORDER — VANCOMYCIN HCL IN DEXTROSE 1-5 GM/200ML-% IV SOLN
1000.0000 mg | INTRAVENOUS | Status: DC
  Administered 2022-09-13 – 2022-09-15 (×3): 1000 mg via INTRAVENOUS
  Filled 2022-09-11 (×3): qty 200

## 2022-09-11 MED ORDER — METOPROLOL TARTRATE 50 MG PO TABS
75.0000 mg | ORAL_TABLET | Freq: Once | ORAL | Status: DC
  Filled 2022-09-11: qty 1

## 2022-09-11 NOTE — Progress Notes (Addendum)
MEWS Progress Note  Patient Details Name: Andrew Brandt MRN: [id number] DOB: 20-Oct-1950 Today's Date: 09/11/2022   MEWS Flowsheet Documentation:  Assess: MEWS Score Temp: 99 F (37.2 C) BP: (!) 151/87 MAP (mmHg): 107 Pulse Rate: (!) 114 ECG Heart Rate: 92 Resp: 18 Level of Consciousness: Alert SpO2: 95 % O2 Device: Room Air Patient Activity (if Appropriate): In bed Assess: MEWS Score MEWS Temp: 0 MEWS Systolic: 0 MEWS Pulse: 2 MEWS RR: 0 MEWS LOC: 0 MEWS Score: 2 MEWS Score Color: Yellow Assess: SIRS CRITERIA SIRS Temperature : 0 SIRS Respirations : 0 SIRS Pulse: 1 SIRS WBC: 0 SIRS Score Sum : 1 SIRS Temperature : 0 SIRS Pulse: 1 SIRS Respirations : 0 SIRS WBC: 0 SIRS Score Sum : 1 Assess: if the MEWS score is Yellow or Red Were vital signs taken at a resting state?: Yes Focused Assessment: No change from prior assessment Does the patient meet 2 or more of the SIRS criteria?: Yes Does the patient have a confirmed or suspected source of infection?: No Provider and Rapid Response Notified?: No MEWS guidelines implemented : No, previously yellow, continue vital signs every 4 hours Provider Name/Title: Internal Medicine Date Provider Notified: 09/11/22 Time Provider Notified: 0319 Method of Notification: Page Notification Reason: Other (Comment) Provider response: Keep monitoring Date of Provider Response: 09/11/2022 Time of Provider Response: [id number]     Avel Sensor 09/11/2022, 3:27 AM

## 2022-09-11 NOTE — Progress Notes (Signed)
Pharmacy Antibiotic Note  Andrew Brandt is a 72 y.o. male admitted on 09/02/2022 with pneumonia.  Pharmacy has been consulted for Cefepime and Vancomycin dosing.  Plan: Cefepime 2gm IV q12h Vancomycin 2048m IV now then 1000 mg IV Q 24 hrs. Goal AUC 400-550. Expected AUC: 447, SCr used: 1.42, Vd coeff 0.5 Will f/u renal function, micro data, and pt's clinical condition Vanc levels prn   Height: 5' 9"$  (175.3 cm) Weight: 104.3 kg (230 lb) IBW/kg (Calculated) : 70.7  Temp (24hrs), Avg:97.9 F (36.6 C), Min:97.3 F (36.3 C), Max:99 F (37.2 C)  Recent Labs  Lab 09/07/22 0335 09/07/22 1511 09/08/22 0152 09/08/22 1816 09/09/22 0746 09/10/22 0346 09/11/22 0123  WBC 5.2   < > 4.8 5.5 4.5 5.0 5.9  CREATININE 1.75*  --  1.68*  --  1.54* 1.47* 1.42*   < > = values in this interval not displayed.    Estimated Creatinine Clearance: 55.1 mL/min (A) (by C-G formula based on SCr of 1.42 mg/dL (H)).    No Known Allergies  Antimicrobials this admission: 2/20 Vanc >>  2/20 Cefepime >>   Microbiology results: Pending  Thank you for allowing pharmacy to be a part of this patient's care.  CSherlon Handing PharmD, BCPS Please see amion for complete clinical pharmacist phone list 09/11/2022 6:17 PM

## 2022-09-11 NOTE — Significant Event (Signed)
Rapid Response Event Note   Reason for Call :  "Wheezing"  Initial Focused Assessment:  Patient at bedside, A&O though speaking in short labored sentences. Lungs diminished, upper airway wheezing auscultated and audible in room. Denies worsening shortness of breath, wheezing, or difficulty breathing. Heart tones abnormal Skin warm and dry. BLE edema 1+ R>L.   156/100 (116) HR 143 RR 32 O2 87 RA  Interventions:  2L [place] Stop IVF Lasix 29m IV Transfer PCU MD at bedside  O2 improved to 99% 2L [place]  Plan of Care:  Jinwala MD reaching out to cards again regarding HR control.  Transfer to PCU.  Event Summary:  MD Notified: SClinton SawyerMD Call Time: 1ReeltownTime: [id number] Time: [phone] RN

## 2022-09-11 NOTE — Progress Notes (Signed)
Subjective:   Summary: Andrew Brandt is a 72 y.o. year old male currently admitted on the IMTS HD#6 for generalized weakness/diarrhea with COVID.  Overnight Events:  Hgb remains stable, but he said that he possible had at least one blood bowel movement overnight. Hgb remains stable this morning.  Minimally tachycardic and remains hemodynamically stable on room air overnight.  He is more somnolent this morning and seems confused. Answering some questions but gets easily distracted. Denies dyspnea, chest pain, abdominal pain, nausea or vomiting.   Contacted cardiology and GI regarding repeat GI bleeding. Girlfriend called as well while we were in the room and discussed findings with her.  Objective:  Vital signs in last 24 hours: Vitals:   09/11/22 0307 09/11/22 0456 09/11/22 0600 09/11/22 0950  BP: (!) 151/87 (!) 152/88 (!) 152/88 138/75  Pulse: (!) 114 99 99 (!) 110  Resp: 18 18 18 17  $ Temp: 99 F (37.2 C) 97.6 F (36.4 C) 97.6 F (36.4 C) (!) 97.3 F (36.3 C)  TempSrc: Oral Oral  Oral  SpO2: 95% 94% 94% 100%  Weight:      Height:       Supplemental O2: Room Air SpO2: 100 %   Physical Exam:  General: chronically ill-appearing elderly male, laying in bed, NAD.  More somnolent today, easily distracted CV: normal rate, irregularly irregular rhythm, no m/r/g. Pulm: normal WOB on RA, no obvious wheezing or rales noted. Abdomen: soft, nondistended, nontender to palpation. Normal BS on my exam.  Clear rectal bleeding soaking sheets underneath him  MSK: trace pitting edema in BLE. Skin: warm and dry.    Filed Weights   09/11/2022 0847  Weight: 104.3 kg     Intake/Output Summary (Last 24 hours) at 09/11/2022 1320 Last data filed at 09/11/2022 0609 Gross per 24 hour  Intake 676.2 ml  Output 1000 ml  Net -323.8 ml    Net IO Since Admission: 5,346.87 mL [09/11/22 1320]  Pertinent Labs:    Latest Ref Rng & Units 09/11/2022    1:23 AM 09/10/2022     3:46 AM 09/09/2022    7:46 AM  CBC  WBC 4.0 - 10.5 K/uL 5.9  5.0  4.5   Hemoglobin 13.0 - 17.0 g/dL 10.1  8.9  9.1   Hematocrit 39.0 - 52.0 % 31.9  27.8  28.5   Platelets 150 - 400 K/uL 150  150  149        Latest Ref Rng & Units 09/11/2022    1:23 AM 09/10/2022    3:46 AM 09/09/2022    7:46 AM  CMP  Glucose 70 - 99 mg/dL 103  108  98   BUN 8 - 23 mg/dL 21  22  23   $ Creatinine 0.61 - 1.24 mg/dL 1.42  1.47  1.54   Sodium 135 - 145 mmol/L 138  138  138   Potassium 3.5 - 5.1 mmol/L 4.3  3.8  3.6   Chloride 98 - 111 mmol/L 103  105  104   CO2 22 - 32 mmol/L 25  23  23   $ Calcium 8.9 - 10.3 mg/dL 9.4  9.2  9.2      Imaging: No results found.   EKG: My EKG interpretation is as follows: A-fib in and out of RVR  Assessment/Plan:   Principal Problem:   COVID-19 virus infection   Patient Summary: Mr. Linarez is a  72 year old with a history of ESRD s/p renal transplant on immunosuppression, prostate cancer s/p prostatectomy, T2DM and hypertension who presents via EMS for generalized weakness, diarrhea and poor appetite and found to have COVID-19 infection.    #Painless hematochezia #Acute blood loss anemia #Hx of diverticulosis Restarted Eliquis yesterday, patient has had recurrent hematochezia since that point with large volume of rectal bleeding this morning.  He remains hemodynamically stable and hemoglobin was stable on morning CBC.  Held Eliquis, started fluids and reach out to GI for further management -Greatly appreciate GI assistance and recommendations -trend CBC, transfuse for hgb <7 and/or hemodynamic instability -Hold Eliquis - IV fluids  #COVID-19 infection #Generalized weakness #Diarrhea Remains afebrile without leukocytosis.  Significantly weak and more somnolent this morning in the setting of recurrent GI bleed.  Likely will need SNF.. -Follow-up GI panel - DuoNebs twice daily -O2 supplementation as needed -Incentive spirometer, pulm toilet -Mucinex 600 mg  twice daily -PT/OT re-eval   #ESRD s/p renal transplant #AKI on CKD4? Kidney function continues to improve.  Will unfortunately need to delay belatacept given complicated course.  Will reach out to [facility] for further guidance regarding this. -Mycophenolate 180 mg twice daily -Prednisone 5 mg daily -Bactrim 400-80 mg every Monday, Wednesday and Friday - Reaching out to [facility] for further guidance regarding belatacept   #New onset paroxysmal A-fib Cardiology increased rate control to metoprolol 50 3 times daily yesterday.  Remains in A-fib, rates elevated in setting of new GI bleed.  Holding Eliquis again.   - Appreciate any further cardiology recs regarding rate control.  He should follow-up with cardiology later this month. - lopressor 57m TID - Trend mag, BMP, replete as needed  #HTN Stable on metoprolol tartate 576mTID as above.   #T2DM Per spouse, patient was recently diagnosed with diabetes and started on metformin 500 mg twice daily. A1c 7.0 6 months ago, 6.1 here. -SSI w/ meals, CBG monitoring -Hold metformin  Diet: Heart Healthy IVF: LR 100 cc/hour VTE: held in setting of gi bleed. Code: Full PT/OT recs: Likely SNF TOC recs:  Family Update:   Dispo: Anticipated discharge pending clinical stability and ongoing workup  SrLinus GalasMD PGY-1 Internal Medicine Resident Pager: [phone] contact the on call pager after 5 pm and on weekends at [phone]

## 2022-09-11 NOTE — Progress Notes (Signed)
Received bedside report from Transfer nurse Thayne Cindric. Patient in afib rvr with rate ranging from 120s to 140s. Patient alert with slow response and no pain complaint. Patient has new order for oral metroprolol  however patient is only oriented to self and place, disoriented to situation and time and has refused all of his oral medications. On-call physician notified of patient status and request for p.o. meds to be switched to IV where possible. On-call physician has told this RN he will come to the bedside shortly. Will continue to monitor.

## 2022-09-11 NOTE — Progress Notes (Signed)
Physical Therapy Treatment Patient Details Name: Andrew Brandt MRN: [id number] DOB: 1951-03-24 Today's Date: 09/11/2022   History of Present Illness Andrew Brandt is a 72 year old who presents via EMS for generalized weakness, diarrhea and poor appetite; with a history of ESRD s/p renal transplant on immunosuppression, prostate cancer s/p prostatectomy, T2DM and hypertension    PT Comments    Pt progressing slowly towards physical therapy goals. Pt reports he wants to return home at d/c, however based on recent performance during therapy sessions, it is likely pt will require frequent/constant supervision/assist at home. Pt reports his girlfriend will be intermittently available only. Today, pt not able to ambulate farther than 5 feet, reporting he is too tired. Pt is currently very deconditioned, and tolerance for functional activity is not improving. Recommend continued skilled therapy intervention at the SNF level to maximize functional independence, safety, and facilitate return home with intermittent support.     Recommendations for follow up therapy are one component of a multi-disciplinary discharge planning process, led by the attending physician.  Recommendations may be updated based on patient status, additional functional criteria and insurance authorization.  Follow Up Recommendations  Skilled nursing-short term rehab (<3 hours/day) Can patient physically be transported by private vehicle: Yes   Assistance Recommended at Discharge Set up Supervision/Assistance  Patient can return home with the following Help with stairs or ramp for entrance;Assistance with cooking/housework;A little help with walking and/or transfers;A little help with bathing/dressing/bathroom;Assist for transportation   Equipment Recommendations  Rolling walker (2 wheels);BSC/3in1    Recommendations for Other Services       Precautions / Restrictions Precautions Precautions: Fall Precaution Comments:  orthostatic hypotension Restrictions Weight Bearing Restrictions: No     Mobility  Bed Mobility Overal bed mobility: Needs Assistance Bed Mobility: Supine to Sit     Supine to sit: Min assist     General bed mobility comments: Min assist for trunk elevation to full sitting position. Use of rails and increased time required.    Transfers Overall transfer level: Needs assistance Equipment used: None Transfers: Sit to/from Stand Sit to Stand: Min guard, From elevated surface, Min assist           General transfer comment: Min guard assist to light min assist for balance support and safety. Pt with very wide BOS.    Ambulation/Gait Ambulation/Gait assistance: Min guard   Assistive device: None Gait Pattern/deviations: Decreased stride length, Decreased step length - right, Decreased step length - left, Wide base of support, Shuffle Gait velocity: Decreased Gait velocity interpretation: <1.31 ft/sec, indicative of household ambulator   General Gait Details: Pt only able to ambulate from bed to chair. States he is unable to go any farther due to fatigue and weakness. Pt maintained wide BOS throughout transition to chair, with low floor clearance and shuffling steps forward.   Stairs             Wheelchair Mobility    Modified Rankin (Stroke Patients Only)       Balance Overall balance assessment: Needs assistance Sitting-balance support: No upper extremity supported, Feet supported Sitting balance-Leahy Scale: Fair     Standing balance support: No upper extremity supported Standing balance-Leahy Scale: Fair Standing balance comment: min guard                            Cognition Arousal/Alertness: Awake/alert Behavior During Therapy: Flat affect Overall Cognitive Status: Impaired/Different from baseline Area of Impairment: Problem solving  Problem Solving: Decreased initiation, Slow processing,  Requires verbal cues          Exercises      General Comments        Pertinent Vitals/Pain Pain Assessment Pain Assessment: No/denies pain    Home Living                          Prior Function            PT Goals (current goals can now be found in the care plan section) Acute Rehab PT Goals Patient Stated Goal: Be able to return home PT Goal Formulation: With patient Time For Goal Achievement: 09/19/22 Potential to Achieve Goals: Good Progress towards PT goals: Progressing toward goals    Frequency    Min 2X/week      PT Plan Discharge plan needs to be updated    Co-evaluation              AM-PAC PT "6 Clicks" Mobility   Outcome Measure  Help needed turning from your back to your side while in a flat bed without using bedrails?: None Help needed moving from lying on your back to sitting on the side of a flat bed without using bedrails?: A Little Help needed moving to and from a bed to a chair (including a wheelchair)?: A Little Help needed standing up from a chair using your arms (e.g., wheelchair or bedside chair)?: A Little Help needed to walk in hospital room?: A Little Help needed climbing 3-5 steps with a railing? : A Little 6 Click Score: 19    End of Session Equipment Utilized During Treatment: Gait belt Activity Tolerance: Patient limited by fatigue Patient left: in chair;with call bell/phone within reach;with chair alarm set Nurse Communication: Mobility status PT Visit Diagnosis: Unsteadiness on feet (R26.81);Muscle weakness (generalized) (M62.81)     Time: [id number] PT Time Calculation (min) (ACUTE ONLY): 24 min  Charges:  $Gait Training: 23-37 mins                     Rolinda Roan, PT, DPT Acute Rehabilitation Services Secure Chat Preferred Office: [phone]    Thelma Comp 09/11/2022, 1:50 PM

## 2022-09-11 NOTE — Progress Notes (Addendum)
Patient received prn breathing treatment but breathing not improved. Still have audible breath sounds  Vitals taken and rapid response nurse called and provider notified and enroute.  Heart rate has been up all day provider aware and is in discussions with cardiac.  Mews not started

## 2022-09-11 NOTE — Progress Notes (Signed)
Messaged by RN for shortness of breath. Went to assess patient at bedside. He looked visibly distressed and was having labored breathing with audible wheezing. He denied any pain but did endorse shortness of breath. He is alert and oriented, although speaking in short sentences which is not his baseline. On lung exam, he has diminished breath sounds and upper airway wheezing. He is tachycardic with irregularly irregular rhythm. Also with 1-2+ pitting edema in bilateral lower extremities R>L.   Continuous pulse oximetry placed showing SpO2 of 86-88% and tachypneic. BP 156/100. HR 140s-150s in AF w RVR. His oxygenation improved to 99% on 2L [place].   CXR from earlier today with new bilateral patchy opacities and vascular congestion.   Patient is without leukocytosis and is afebrile, but given new patchy opacities that were not present on admission CXR is concerning for possible infection. Given history of renal transplant and being on immunosuppressive medications, will start broad spectrum antibiotics for coverage.  Plan: -stopped IVF -on 2L [place], wean as tolerated to maintain O2 sats >92% -IV lasix 20m once -transfer to progressive -reach out to cards for better rate control -continue xopenex nebs q6h -vanc/cefepime

## 2022-09-11 NOTE — Progress Notes (Signed)
Progress Note  Patient Name: Andrew Brandt Date of Encounter: 09/11/2022  Primary Cardiologist:   Sanda Klein, MD   Subjective   He continues to report back pain.   Hematochezia again today.    Inpatient Medications    Scheduled Meds:  sodium chloride   Intravenous Once   Chlorhexidine Gluconate Cloth  6 each Topical Daily   feeding supplement  237 mL Oral BID BM   guaiFENesin  600 mg Oral BID   insulin aspart  0-15 Units Subcutaneous TID WC   metoprolol tartrate  50 mg Oral TID   mycophenolate  180 mg Oral BID   predniSONE  5 mg Oral Q breakfast   sulfamethoxazole-trimethoprim  1 tablet Oral Q M,W,F   Continuous Infusions:  lactated ringers     PRN Meds: acetaminophen **OR** acetaminophen, benzonatate, ipratropium-albuterol, ondansetron **OR** ondansetron (ZOFRAN) IV, mouth rinse, senna-docusate   Vital Signs    Vitals:   09/11/22 0307 09/11/22 0456 09/11/22 0600 09/11/22 0950  BP: (!) 151/87 (!) 152/88 (!) 152/88 138/75  Pulse: (!) 114 99 99 (!) 110  Resp: 18 18 18 17  $ Temp: 99 F (37.2 C) 97.6 F (36.4 C) 97.6 F (36.4 C) (!) 97.3 F (36.3 C)  TempSrc: Oral Oral  Oral  SpO2: 95% 94% 94% 100%  Weight:      Height:        Intake/Output Summary (Last 24 hours) at 09/11/2022 1035 Last data filed at 09/11/2022 [id number] Gross per 24 hour  Intake 976.2 ml  Output 1000 ml  Net -23.8 ml   Filed Weights   09/02/2022 0847  Weight: 104.3 kg    Telemetry    Atrial fib with occasional increased ventricular rate.  - Personally Reviewed  ECG    NA - Personally Reviewed  Physical Exam   GEN: No  acute distress.   Neck: No  JVD Cardiac: Irregular RR, no murmurs, rubs, or gallops.  Respiratory: Clear   to auscultation bilaterally. GI: Soft, nontender, non-distended, normal bowel sounds  MS:  No edema; No deformity. Neuro:   Nonfocal  Psych: Oriented and appropriate  Labs    Chemistry Recent Labs  Lab 09/05/22 0036 09/06/22 0314 09/07/22 0335  09/08/22 0152 09/09/22 0746 09/10/22 0346 09/11/22 0123  NA 136   < > 138 137 138 138 138  K 3.2*   < > 4.5 3.9 3.6 3.8 4.3  CL 99   < > 103 105 104 105 103  CO2 23   < > 22 20* 23 23 25  $ GLUCOSE 91   < > 91 95 98 108* 103*  BUN 42*   < > 26* 26* 23 22 21  $ CREATININE 2.36*   < > 1.75* 1.68* 1.54* 1.47* 1.42*  CALCIUM 8.4*   < > 8.9 8.5* 9.2 9.2 9.4  PROT  --   --  5.5* 5.3*  --   --   --   ALBUMIN 2.9*  --  2.8* 2.5*  --   --   --   AST  --   --  17 14*  --   --   --   ALT  --   --  8 9  --   --   --   ALKPHOS  --   --  36* 29*  --   --   --   BILITOT  --   --  0.7 1.0  --   --   --   [id number]  28*   < > 41* 43* 47* 50* 52*  ANIONGAP 14   < > 13 12 11 10 10   $ < > = values in this interval not displayed.     Hematology Recent Labs  Lab 09/09/22 0746 09/10/22 0346 09/11/22 0123  WBC 4.5 5.0 5.9  RBC 3.22* 3.15* 3.62*  HGB 9.1* 8.9* 10.1*  HCT 28.5* 27.8* 31.9*  MCV 88.5 88.3 88.1  MCH 28.3 28.3 27.9  MCHC 31.9 32.0 31.7  RDW 18.4* 18.6* 18.4*  PLT 149* 150 150    Cardiac EnzymesNo results for input(s): "TROPONINI" in the last 168 hours. No results for input(s): "TROPIPOC" in the last 168 hours.   BNPNo results for input(s): "BNP", "PROBNP" in the last 168 hours.   DDimer No results for input(s): "DDIMER" in the last 168 hours.   Radiology    No results found.  Cardiac Studies   ECHO:  1. Left ventricular ejection fraction, by estimation, is 60 to 65%. The  left ventricle has normal function. The left ventricle has no regional  wall motion abnormalities. There is mild left ventricular hypertrophy of  the basal-septal segment. Left  ventricular diastolic function could not be evaluated.   2. Right ventricular systolic function is normal. The right ventricular  size is normal.   3. Left atrial size was mildly dilated.   4. The mitral valve is normal in structure. Trivial mitral valve  regurgitation. No evidence of mitral stenosis.   5. The aortic valve is  tricuspid. Aortic valve regurgitation is not  visualized. Aortic valve sclerosis is present, with no evidence of aortic  valve stenosis.    Patient Profile     72 y.o. 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 Was orthostatic this admission, also in new onset Afib of unclear chronicity.    Assessment & Plan    Atrial fib:   Was on Eliquis but now with recurrent hematochezia.  DOAC held.  Increased the beta blocker dose yesterday and can leave TID today to see how he does with this.  I will consider an increase in the AM.    For questions or updates, please contact [place] Please consult [email or web address] for contact info under Cardiology/STEMI.   Signed, Minus Breeding, MD  09/11/2022, 10:35 AM

## 2022-09-11 NOTE — Progress Notes (Signed)
Subjective: Hematochezia with DOAC.  Objective: Vital signs in last 24 hours: Temp:  [97.3 F (36.3 C)-99 F (37.2 C)] 97.3 F (36.3 C) (02/20 0950) Pulse Rate:  [99-118] 110 (02/20 0950) Resp:  [17-18] 17 (02/20 0950) BP: (128-156)/(75-108) 138/75 (02/20 0950) SpO2:  [94 %-100 %] 100 % (02/20 0950) Last BM Date : 09/08/22  Intake/Output from previous day: 02/19 0701 - 02/20 0700 In: 1276.2 [P.O.:600; I.V.:676.2] Out: 1000 [Urine:1000] Intake/Output this shift: No intake/output data recorded.  General appearance: very weak, listless, motor retardation GI: soft, non-tender; bowel sounds normal; no masses,  no organomegaly  Lab Results: Recent Labs    09/09/22 0746 09/10/22 0346 09/11/22 0123  WBC 4.5 5.0 5.9  HGB 9.1* 8.9* 10.1*  HCT 28.5* 27.8* 31.9*  PLT 149* 150 150   BMET Recent Labs    09/09/22 0746 09/10/22 0346 09/11/22 0123  NA 138 138 138  K 3.6 3.8 4.3  CL 104 105 103  CO2 23 23 25  $ GLUCOSE 98 108* 103*  BUN 23 22 21  $ CREATININE 1.54* 1.47* 1.42*  CALCIUM 9.2 9.2 9.4   LFT No results for input(s): "PROT", "ALBUMIN", "AST", "ALT", "ALKPHOS", "BILITOT", "BILIDIR", "IBILI" in the last 72 hours. PT/INR No results for input(s): "LABPROT", "INR" in the last 72 hours. Hepatitis Panel No results for input(s): "HEPBSAG", "HCVAB", "HEPAIGM", "HEPBIGM" in the last 72 hours. C-Diff No results for input(s): "CDIFFTOX" in the last 72 hours. Fecal Lactopherrin No results for input(s): "FECLLACTOFRN" in the last 72 hours.  Studies/Results: No results found.  Medications: Scheduled:  Chlorhexidine Gluconate Cloth  6 each Topical Daily   feeding supplement  237 mL Oral BID BM   guaiFENesin  600 mg Oral BID   insulin aspart  0-15 Units Subcutaneous TID WC   metoprolol tartrate  50 mg Oral TID   mycophenolate  180 mg Oral BID   predniSONE  5 mg Oral Q breakfast   sulfamethoxazole-trimethoprim  1 tablet Oral Q M,W,F   Continuous:  lactated ringers 100  mL/hr at 09/11/22 1047    Assessment/Plan: 1) Hematochezia with restarting DOAC. 2) Persistent extreme fatigue and weakness. 3) COVID-19.   The patient does need a colonoscopy, but he is in no clinical condition to prep for the procedure.  He barely moves with significant assistance.  Since I last saw him on Friday, there is minimal improvement in his fatigue/listless condition.  He was offered an NG tube as an alternative for the prepping, but he declined the intervention.  Also, in the past he prepped very poorly.  It is anticipated that he will need a two day prep to ensure a successful examination.  Plan: 1) Continue with supportive care.  LOS: 6 days   Minami Arriaga D 09/11/2022, 11:53 AM

## 2022-09-11 NOTE — Progress Notes (Signed)
MEWS Progress Note  Patient Details Name: PHILEMON MICHALAK MRN: [id number] DOB: 02-Nov-1950 Today's Date: 09/11/2022   MEWS Flowsheet Documentation:  Assess: MEWS Score Temp: 97.6 F (36.4 C) BP: (!) 152/88 MAP (mmHg): 108 Pulse Rate: 99 ECG Heart Rate: 92 Resp: 18 Level of Consciousness: Alert SpO2: 94 % O2 Device: Room Air Patient Activity (if Appropriate): In bed Assess: MEWS Score MEWS Temp: 0 MEWS Systolic: 0 MEWS Pulse: 0 MEWS RR: 0 MEWS LOC: 0 MEWS Score: 0 MEWS Score Color: Green Assess: SIRS CRITERIA SIRS Temperature : 0 SIRS Respirations : 0 SIRS Pulse: 1 SIRS WBC: 0 SIRS Score Sum : 1 SIRS Temperature : 0 SIRS Pulse: 1 SIRS Respirations : 0 SIRS WBC: 0 SIRS Score Sum : 1 Assess: if the MEWS score is Yellow or Red Were vital signs taken at a resting state?: Yes Focused Assessment: Change from prior assessment (see assessment flowsheet) Does the patient meet 2 or more of the SIRS criteria?: No Does the patient have a confirmed or suspected source of infection?: No Provider and Rapid Response Notified?: No MEWS guidelines implemented : No, other (Comment) Notify: Charge Nurse/RN Name of Charge Nurse/RN Notified: Lexine Baton, Psychologist, counselling Notification Provider Name/Title: Internal Medicine Date Provider Notified: 09/11/22 Time Provider Notified: 0319 Method of Notification: Page Notification Reason: Other (Comment) Provider response: See new orders Date of Provider Response: 09/07/22 Time of Provider Response: [id number]      Avel Sensor 09/11/2022, 6:23 AM

## 2022-09-12 ENCOUNTER — Inpatient Hospital Stay (HOSPITAL_COMMUNITY): Payer: Medicare Other

## 2022-09-12 DIAGNOSIS — R609 Edema, unspecified: Secondary | ICD-10-CM | POA: Diagnosis not present

## 2022-09-12 DIAGNOSIS — U071 COVID-19: Secondary | ICD-10-CM | POA: Diagnosis not present

## 2022-09-12 DIAGNOSIS — R0602 Shortness of breath: Secondary | ICD-10-CM | POA: Diagnosis not present

## 2022-09-12 DIAGNOSIS — I4891 Unspecified atrial fibrillation: Secondary | ICD-10-CM | POA: Diagnosis not present

## 2022-09-12 DIAGNOSIS — G9340 Encephalopathy, unspecified: Secondary | ICD-10-CM | POA: Diagnosis not present

## 2022-09-12 LAB — BASIC METABOLIC PANEL
Anion gap: 11 (ref 5–15)
BUN: 25 mg/dL — ABNORMAL HIGH (ref 8–23)
CO2: 24 mmol/L (ref 22–32)
Calcium: 9.4 mg/dL (ref 8.9–10.3)
Chloride: 103 mmol/L (ref 98–111)
Creatinine, Ser: 1.57 mg/dL — ABNORMAL HIGH (ref 0.61–1.24)
GFR, Estimated: 46 mL/min — ABNORMAL LOW (ref >60)
Glucose, Bld: 102 mg/dL — ABNORMAL HIGH (ref 70–99)
Potassium: 4.2 mmol/L (ref 3.5–5.1)
Sodium: 138 mmol/L (ref 135–145)

## 2022-09-12 LAB — CBC
HCT: 34.7 % — ABNORMAL LOW (ref 39.0–52.0)
Hemoglobin: 10.7 g/dL — ABNORMAL LOW (ref 13.0–17.0)
MCH: 28.2 pg (ref 26.0–34.0)
MCHC: 30.8 g/dL (ref 30.0–36.0)
MCV: 91.6 fL (ref 80.0–100.0)
Platelets: 166 10*3/uL (ref 150–400)
RBC: 3.79 MIL/uL — ABNORMAL LOW (ref 4.22–5.81)
RDW: 18.7 % — ABNORMAL HIGH (ref 11.5–15.5)
WBC: 7.7 10*3/uL (ref 4.0–10.5)
nRBC: 0.4 % — ABNORMAL HIGH (ref 0.0–0.2)

## 2022-09-12 LAB — LACTIC ACID, PLASMA
Lactic Acid, Venous: 1.8 mmol/L (ref 0.5–1.9)
Lactic Acid, Venous: 1.8 mmol/L (ref 0.5–1.9)

## 2022-09-12 LAB — GLUCOSE, CAPILLARY
Glucose-Capillary: 84 mg/dL (ref 70–99)
Glucose-Capillary: 123 mg/dL — ABNORMAL HIGH (ref 70–99)
Glucose-Capillary: 99 mg/dL (ref 70–99)

## 2022-09-12 MED ORDER — FUROSEMIDE 10 MG/ML IJ SOLN
40.0000 mg | Freq: Once | INTRAMUSCULAR | Status: AC
  Administered 2022-09-12: 40 mg via INTRAVENOUS
  Filled 2022-09-12: qty 4

## 2022-09-12 MED ORDER — VANCOMYCIN HCL 2000 MG/400ML IV SOLN
2000.0000 mg | Freq: Once | INTRAVENOUS | Status: AC
  Administered 2022-09-12: 2000 mg via INTRAVENOUS
  Filled 2022-09-12: qty 400

## 2022-09-12 MED ORDER — DEXTROSE 50 % IV SOLN
1.0000 | Freq: Once | INTRAVENOUS | Status: AC
  Administered 2022-09-12: 50 mL via INTRAVENOUS
  Filled 2022-09-12: qty 50

## 2022-09-12 MED ORDER — METOPROLOL TARTRATE 50 MG PO TABS
50.0000 mg | ORAL_TABLET | Freq: Two times a day (BID) | ORAL | Status: DC
  Administered 2022-09-12: 50 mg via ORAL
  Filled 2022-09-12: qty 1

## 2022-09-12 NOTE — Progress Notes (Signed)
Code stroke called on patient. Rapid response notified and present. Agricultural consultant notified.  Daymon Larsen, RN

## 2022-09-12 NOTE — Care Management Important Message (Signed)
Important Message  Patient Details  Name: Andrew Brandt MRN: [id number] Date of Birth: May 07, 1951   Medicare Important Message Given:  Yes     [name] 09/12/2022, 10:35 AM

## 2022-09-12 NOTE — Progress Notes (Signed)
Consulted to restart I.V. after infiltration in L forearm. New I.V. restarted in L lower upper arm due to old graft.

## 2022-09-12 NOTE — Progress Notes (Signed)
Patient confused patient with dyspnea on exertion and afib rvr is not clinically stable for mobility orders. On-call physicians aware.

## 2022-09-12 NOTE — Progress Notes (Signed)
Lower extremity venous bilateral study completed.  Preliminary results relayed to [place], RN on the floor. Secure chat sent to [facility] and team.   See CV Proc for preliminary results report.   Darlin Coco, RDMS, RVT

## 2022-09-12 NOTE — Consult Note (Signed)
Neurology Consultation  Reason for Consult: Code Stroke Referring Physician: Dareen Piano  CC: Left facial droop, left side weakness  History is obtained from: Patient  HPI: Andrew Brandt is a 72 y.o. male with a past medical history of cancer, CKD on dialysis, DM, HTN, kidney transplant, new onset afib presenting  who initially presented to the hospital with weakness, diarrhea, and covid positive. This morning he was found to be somnolent and had a negative head CT. He was then found to have a left facial droop and left sided weakness by the medicine team and a code stroke was called. He does appear to be encephalopathic on exam and unable to answer questions and name some objects, but otherwise nonfocal by the time of neurology evaluation. He has a DVT in his right leg and a new onset afib. He was on eliquis, but developed a GI bleed and therefore this was discontinued. Primary team has also consulted IR for an IVC filter placement.     LKW: Last night  tpa given?: No Premorbid modified Rankin scale (mRS): 4  ROS: Full ROS was performed and is negative except as noted in the HPI.   Past Medical History:  Diagnosis Date   Cancer [name]) 2017   prostate cancer   Chronic kidney disease    Dialysis Tues-Thurs.[name]   Constipation    Diabetes mellitus ([place])    Headache    occ   History of kidney transplant    Hypertension    Neck pain    pulled muscle in neck on cyclobenzaprine prn    Family History  Problem Relation Age of Onset   Cancer Mother    Diabetes Other        grandparents    Social History:   reports that he has quit smoking. His smoking use included cigarettes. He has never used smokeless tobacco. He reports current alcohol use. He reports that he does not use drugs.  Medications  Current Facility-Administered Medications:    acetaminophen (TYLENOL) tablet 650 mg, 650 mg, Oral, Q6H PRN, 650 mg at 09/09/22 2256 **OR** acetaminophen  (TYLENOL) suppository 650 mg, 650 mg, Rectal, Q6H PRN, Coy Saunas, Charisse March, MD   benzonatate (TESSALON) capsule 100 mg, 100 mg, Oral, TID PRN, Angelique Blonder, DO, 100 mg at 09/09/22 2255   ceFEPIme (MAXIPIME) 2 g in sodium chloride 0.9 % 100 mL IVPB, 2 g, Intravenous, Q12H, Franky Macho, RPH, Last Rate: 200 mL/hr at 09/12/22 0830, 2 g at 09/12/22 0830   Chlorhexidine Gluconate Cloth 2 % PADS 6 each, 6 each, Topical, Daily, Aldine Contes, MD, 6 each at 09/12/22 0830   feeding supplement (ENSURE ENLIVE / ENSURE PLUS) liquid 237 mL, 237 mL, Oral, BID BM, Lacinda Axon, MD, 237 mL at 09/10/22 1113   guaiFENesin (MUCINEX) 12 hr tablet 600 mg, 600 mg, Oral, BID, Lacinda Axon, MD, 600 mg at 09/11/22 2212   levalbuterol (XOPENEX) nebulizer solution 0.63 mg, 0.63 mg, Nebulization, Q4H PRN, Aldine Contes, MD, 0.63 mg at 09/11/22 1627   levalbuterol (XOPENEX) nebulizer solution 0.63 mg, 0.63 mg, Nebulization, Q6H, Narendra, Nischal, MD, 0.63 mg at 09/12/22 0158   metoprolol tartrate (LOPRESSOR) tablet 50 mg, 50 mg, Oral, TID, Virl Axe, MD   mycophenolate (MYFORTIC) EC tablet 180 mg, 180 mg, Oral, BID, Virl Axe, MD, 180 mg at 09/11/22 2212   ondansetron (ZOFRAN) tablet 4 mg, 4 mg, Oral, Q6H PRN **OR** ondansetron (ZOFRAN) injection 4 mg, 4 mg, Intravenous, Q6H  PRN, Lacinda Axon, MD   Oral care mouth rinse, 15 mL, Mouth Rinse, PRN, Dareen Piano, Nischal, MD   predniSONE (DELTASONE) tablet 5 mg, 5 mg, Oral, Q breakfast, Virl Axe, MD, 5 mg at 09/11/22 0946   senna-docusate (Senokot-S) tablet 1 tablet, 1 tablet, Oral, QHS PRN, Lacinda Axon, MD   sulfamethoxazole-trimethoprim (BACTRIM) 400-80 MG per tablet 1 tablet, 1 tablet, Oral, Q M,W,F, Virl Axe, MD, 1 tablet at 09/10/22 1112   vancomycin (VANCOCIN) IVPB 1000 mg/200 mL premix, 1,000 mg, Intravenous, Q24H, Franky Macho, RPH   Exam: Current vital signs: BP (!) 168/91 (BP Location: Left Leg)   Pulse (!)  107   Temp (!) 97.4 F (36.3 C) (Oral)   Resp 19   Ht 5' 9"$  (1.753 m)   Wt 103.1 kg   SpO2 99%   BMI 33.57 kg/m  Vital signs in last 24 hours: Temp:  [97.4 F (36.3 C)-98.3 F (36.8 C)] 97.4 F (36.3 C) (02/21 1213) Pulse Rate:  [86-144] 107 (02/21 1213) Resp:  [19-32] 19 (02/21 1213) BP: (118-168)/(66-100) 168/91 (02/21 1213) SpO2:  [87 %-100 %] 99 % (02/21 1213) Weight:  [103.1 kg] 103.1 kg (02/20 2040)  GENERAL: Slightly sleepy but able to cooperate with examination, ill-appearing HEENT: - Normocephalic and atraumatic, dry mm, no LN++, no Thyromegally LUNGS -appears slightly short of breath at rest CV -irregularly irregular rhythm on monitor ABDOMEN -protuberant, soft Ext: warm, well perfused, intact peripheral pulses, no edema  NEURO:  Mental Status: Alert to self, poor attention and concentration throughout exam. Able to name some objects but not all objects.  Able to repeat Language: speech is dysarthric  Cranial Nerves: PERRL 3 mm -> 1 mm /brisk. EOMI, visual fields full, no facial asymmetry, facial sensation intact, hearing intact, tongue/uvula/soft palate midline, normal sternocleidomastoid and trapezius muscle strength. No evidence of tongue atrophy or fasciculations Motor:  RUE 5/5 LUE 5/5 RLE 3/5 LLE 3/5 Tone: is normal and bulk is normal Sensation- Intact to light touch bilaterally Coordination: FTN intact bilaterally, unable to complete on lower extremities  Gait- deferred    NIHSS components Score: Comment  1a Level of Conscious 0[x]$  1[]$  2[]$  3[]$         1b LOC Questions 0[]$  1[x]$  2[]$           1c LOC Commands 0[x]$  1[]$  2[]$           2 Best Gaze 0[x]$  1[]$  2[]$           3 Visual 0[x]$  1[]$  2[]$  3[]$         4 Facial Palsy 0[x]$  1[]$  2[]$  3[]$         5a Motor Arm - left 0[x]$  1[]$  2[]$  3[]$  4[]$  UN[]$     5b Motor Arm - Right 0[x]$  1[]$  2[]$  3[]$  4[]$  UN[]$     6a Motor Leg - Left 0[]$  1[x]$  2[]$  3[]$  4[]$  UN[]$     6b Motor Leg - Right 0[]$  1[x]$  2[]$  3[]$  4[]$  UN[]$     7 Limb Ataxia 0[x]$  1[]$   2[]$  3[]$  UN[]$       8 Sensory 0[x]$  1[]$  2[]$  UN[]$         9 Best Language 0[x]$  1[]$  2[]$  3[]$         10 Dysarthria 0[]$  1[x]$  2[]$  UN[]$         11 Extinct. and Inattention 0[x]$  1[]$  2[]$           TOTAL: 4        Labs I have reviewed labs in epic and the results pertinent to  this consultation are:  CBC    Component Value Date/Time   WBC 7.7 09/12/2022 0810   RBC 3.79 (L) 09/12/2022 0810   HGB 10.7 (L) 09/12/2022 0810   HCT 34.7 (L) 09/12/2022 0810   PLT 166 09/12/2022 0810   MCV 91.6 09/12/2022 0810   MCH 28.2 09/12/2022 0810   MCHC 30.8 09/12/2022 0810   RDW 18.7 (H) 09/12/2022 0810   LYMPHSABS 1.1 11/24/2013 0630   MONOABS 0.3 11/24/2013 0630   EOSABS 0.2 11/24/2013 0630   BASOSABS 0.0 11/24/2013 0630    CMP     Component Value Date/Time   NA 138 09/12/2022 0810   K 4.2 09/12/2022 0810   CL 103 09/12/2022 0810   CO2 24 09/12/2022 0810   GLUCOSE 102 (H) 09/12/2022 0810   BUN 25 (H) 09/12/2022 0810   CREATININE 1.57 (H) 09/12/2022 0810   CALCIUM 9.4 09/12/2022 0810   CALCIUM 5.1 (LL) 11/20/2013 0755   PROT 5.3 (L) 09/08/2022 0152   ALBUMIN 2.5 (L) 09/08/2022 0152   AST 14 (L) 09/08/2022 0152   ALT 9 09/08/2022 0152   ALKPHOS 29 (L) 09/08/2022 0152   BILITOT 1.0 09/08/2022 0152   GFRNONAA 46 (L) 09/12/2022 0810   GFRAA 6 (L) 07/30/2018 1947    Lipid Panel  No results found for: "CHOL", "TRIG", "HDL", "CHOLHDL", "VLDL", "LDLCALC", "LDLDIRECT"   Imaging I have reviewed the images obtained:  CT-head personally reviewed, agree with radiology:   1. No acute intracranial abnormality. 2. Chronic microvascular ischemic change of the white matter [this may be masking an acute process]  MRI examination of the brain pending  ECHO 09/05/2022  1. Left ventricular ejection fraction, by estimation, is 60 to 65%. The  left ventricle has normal function. The left ventricle has no regional  wall motion abnormalities. There is mild left ventricular hypertrophy of  the basal-septal  segment. Left  ventricular diastolic function could not be evaluated.   2. Right ventricular systolic function is normal. The right ventricular  size is normal.   3. Left atrial size was mildly dilated.   4. The mitral valve is normal in structure. Trivial mitral valve  regurgitation. No evidence of mitral stenosis.   5. The aortic valve is tricuspid. Aortic valve regurgitation is not  visualized. Aortic valve sclerosis is present, with no evidence of aortic  valve stenosis.   Lab Results  Component Value Date   HGBA1C 6.1 (H) 09/05/2022    No results found for: "CHOL", "HDL", "LDLCALC", "LDLDIRECT", "TRIG", "CHOLHDL"   Assessment:  72 y.o. male presenting with a  past medical history of cancer, CKD on dialysis, DM, HTN, kidney transplant, new onset afib who initially presented to the hospital with weakness, diarrhea, and covid positive. This morning he was found to be somnolent and had a negative head CT. He was then found to have a left facial droop and left sided weakness by the medicine team and a code stroke was called. On exam he seems to be improving as he no longer as any focal deficits. He does appear to be mildly encephalopathic on exam, likely toxic/metabolic encephalopathy in the setting of his COVID-19 infection, and unable to answer all questions and name some objects, but otherwise neurological examination was reassuring without focal weakness/numbness or clear aphasia. He has a DVT in his left leg and a new onset afib. He was on eliquis, but developed a GI bleed. Primary team has also consulted IR for an IVC filter placement.  From a stroke prevention perspective, would recommend anticoagulation for his atrial fibrillation once medically able to tolerate this, after MRI brain confirms no significant new ischemic infarct.  Would utilize at least aspirin 325 mg daily until anticoagulation can be started unless this is felt to be medically too risky due to his GI bleed and other  comorbidities  Impression: TIA versus acute ischemic infarct in the setting of afib not on anticoagulation  Additional element of toxic/metabolic encephalopathy in the setting of acute COVID-19 infection  Recommendations: Agree with MRI brain without contrast Every 2 hours neurochecks until MRI results negative, or if MRI results positive neurochecks per standard protocol Lipid panel, goal LDL less than 70, adjust lipid lowering medications as needed to achieve this goal A1c meeting goal at less than 7% Would continue to hold anticoagulation pending MRI brain,  Aspirin 325 mg daily if he will tolerate from a medical perspective until anticoagulation can be started for his atrial fibrillation Encephalopathy work up Continue PT/OT/SLP Neurology will follow-up MRI brain but otherwise will be available as needed going forward  Patient seen and examined by NP/APP with MD. MD to update note as needed.   Janine Ores, DNP, FNP-BC [facility] Pager: [phone]  Attending Neurologist's note:  I personally saw this patient, gathering history, performing a full neurologic examination, reviewing relevant labs, personally reviewing relevant imaging including head CT, and formulated the assessment and plan, adding the note above for completeness and clarity to accurately reflect my thoughts  Lesleigh Noe MD-PhD [facility] [phone] Available 7 AM to 7 PM, outside these hours please contact Neurologist on call listed on AMION

## 2022-09-12 NOTE — Code Documentation (Signed)
Andrew Brandt is a 72 yr old male with a PMH of cancer, CKD on dialysis, DM, HTN, Kidney Transplant, AF, GIB,  DVT, and Covid 19 infection. Code stroke was activated for left sided weakness and facial droop. He had been taking Eliquis, last dose 2/19 PM.    Stroke team to pt's bedside. NIHSS 4. Pt with bilateral leg drift, dysarthria, and drowsy. No focal abnormality noted. Pt was much less alert earlier today, and had a CTH at that time. Per Dr. Curly [place], no hemorrhage on that image.Pt has MRI head ordered. He is not a candidate for thrombolytics due to Eliquis, GI bleed and no current focal abnormalities. He is ineligible for thrombectomy as exam is LVO negative. He will need q 2 hr VS and NIHSS for 12 hr then q 4 (unless no stroke on MRI). Bedside handoff with [name].

## 2022-09-12 NOTE — Progress Notes (Addendum)
Went to assess patient at approximately 1225 after being messaged about facial droop by cardiology team. LNK unclear, facial droop new from exam this morning.  He is more oriented and answering questions at this point.  No new symptoms including chest pain, dyspnea, vision loss.  He reports still feeling weak in general, but does not endorse any numbness or other focal neurological symptoms.  Hemodynamically stable.  Remains oxygenating well on 2 L nasal cannula, afebrile CV: normal rate, irregularly irregular rhythm, no m/r/g. Pulm: Normal respiratory effort on 2 L nasal cannula, minimal gravity dependent crackles, no wheezing at this time Neuro: Alert and oriented x 2, attempting to answer questions appropriately, following instructions.  Dysarthric compared to prior exams. PERRL, EOMI.CN5, 8, 11, 12 intact. L facial droop. LUE 4/5, RUE 4/5, L slightly weaker LLE/RLE 3/5, dift bilaterally Finger to nose intact bilat  Code stroke and rapid team arrived to complete NIHSS 4 total.   Patient is not a candidate for TNK given GI bleed. [place] earlier today unremarkable. Have trialed eliquis multiple times last week and he developed GIB each time, ASA 325 a possibility but inferior to eliquis for stroke prevention. Will follow up MRI which was ordered earlier and continue encephalopathy management as detailed in prior note. Q2 exams for now.   Of note, he was confirmed to have R DVT as well today. AC contraindicated. Ir consulted for IVC filter placement, discussed with patient's significant other. Will need VQ scan to rule out PE.

## 2022-09-12 NOTE — Progress Notes (Signed)
OT Cancellation Note  Patient Details Name: Andrew Brandt MRN: [id number] DOB: Dec 28, 1950   Cancelled Treatment:    Reason Eval/Treat Not Completed: Medical issues which prohibited therapy. Per nursing note at approximately 12:25 pm, code stroke called on pt. Will re attempt when appropriate  [name] 09/12/2022, 1:05 PM

## 2022-09-12 NOTE — Progress Notes (Signed)
Physicians made aware of crimson stool x 2 on this shift. Will continue to monitor bowel movements and hgb.

## 2022-09-12 NOTE — Progress Notes (Addendum)
Rounding Note    Patient Name: Andrew Brandt Date of Encounter: 09/12/2022  [place] Cardiologist: Sanda Klein, MD   Subjective   Patient is lethargic and tired appearing on my exam. Answers to ROS questions were very brief and limited. Denies chest pain, palpitations. Endorses some shortness of breath, improved from yesterday evening.  Inpatient Medications    Scheduled Meds:  Chlorhexidine Gluconate Cloth  6 each Topical Daily   dextrose  1 ampule Intravenous Once   feeding supplement  237 mL Oral BID BM   guaiFENesin  600 mg Oral BID   levalbuterol  0.63 mg Nebulization Q6H   metoprolol tartrate  50 mg Oral TID   mycophenolate  180 mg Oral BID   predniSONE  5 mg Oral Q breakfast   sulfamethoxazole-trimethoprim  1 tablet Oral Q M,W,F   Continuous Infusions:  ceFEPime (MAXIPIME) IV 2 g (09/12/22 0830)   vancomycin     PRN Meds: acetaminophen **OR** acetaminophen, benzonatate, levalbuterol, ondansetron **OR** ondansetron (ZOFRAN) IV, mouth rinse, senna-docusate   Vital Signs    Vitals:   09/12/22 0400 09/12/22 0500 09/12/22 0600 09/12/22 0824  BP:  (!) 143/67 134/77 137/79  Pulse:   93 86  Resp: 20 (!) 21 (!) 25 20  Temp:   98.3 F (36.8 C) 97.6 F (36.4 C)  TempSrc:   Axillary Axillary  SpO2:   98% 100%  Weight:      Height:        Intake/Output Summary (Last 24 hours) at 09/12/2022 1116 Last data filed at 09/12/2022 0300 Gross per 24 hour  Intake 1183.33 ml  Output --  Net 1183.33 ml      09/11/2022    8:40 PM 09/13/2022    8:47 AM 04/30/2022    8:23 AM  Last 3 Weights  Weight (lbs) 227 lb 4.7 oz 230 lb 257 lb 8 oz  Weight (kg) 103.1 kg 104.327 kg 116.801 kg      Telemetry    Atrial fibrillation, rates 80s-120s - Personally Reviewed  ECG    No new tracing - Personally Reviewed  Physical Exam   GEN: No acute distress. Patient is quite lethargic Neck: No JVD Cardiac: irregularly irregular, no murmurs, rubs, or gallops.   Respiratory: Diminished in bases with faint crackles noted GI: Soft, nontender, non-distended  MS: 1+ bilateral lower extremity edema; No deformity. Neuro:  left side facial droop. Otherwise cranial nerves appear grossly intact Psych: limited assessment  Labs    High Sensitivity Troponin:  No results for input(s): "TROPONINIHS" in the last 720 hours.   Chemistry Recent Labs  Lab 09/06/22 0314 09/07/22 0335 09/08/22 0152 09/09/22 0746 09/10/22 0346 09/11/22 0123 09/12/22 0810  NA 138 138 137   < > 138 138 138  K 3.3* 4.5 3.9   < > 3.8 4.3 4.2  CL 101 103 105   < > 105 103 103  CO2 22 22 20*   < > 23 25 24  $ GLUCOSE 93 91 95   < > 108* 103* 102*  BUN 32* 26* 26*   < > 22 21 25*  CREATININE 1.99* 1.75* 1.68*   < > 1.47* 1.42* 1.57*  CALCIUM 8.5* 8.9 8.5*   < > 9.2 9.4 9.4  MG 2.0  --   --   --   --   --   --   PROT  --  5.5* 5.3*  --   --   --   --  ALBUMIN  --  2.8* 2.5*  --   --   --   --   AST  --  17 14*  --   --   --   --   ALT  --  8 9  --   --   --   --   ALKPHOS  --  36* 29*  --   --   --   --   BILITOT  --  0.7 1.0  --   --   --   --   GFRNONAA 35* 41* 43*   < > 50* 52* 46*  ANIONGAP 15 13 12   $ < > 10 10 11   $ < > = values in this interval not displayed.    Lipids No results for input(s): "CHOL", "TRIG", "HDL", "LABVLDL", "LDLCALC", "CHOLHDL" in the last 168 hours.  Hematology Recent Labs  Lab 09/10/22 0346 09/11/22 0123 09/11/22 1311 09/12/22 0810  WBC 5.0 5.9  --  7.7  RBC 3.15* 3.62*  --  3.79*  HGB 8.9* 10.1* 10.8* 10.7*  HCT 27.8* 31.9* 33.7* 34.7*  MCV 88.3 88.1  --  91.6  MCH 28.3 27.9  --  28.2  MCHC 32.0 31.7  --  30.8  RDW 18.6* 18.4*  --  18.7*  PLT 150 150  --  166   Thyroid  Recent Labs  Lab 09/06/22 0314  TSH 0.586    BNPNo results for input(s): "BNP", "PROBNP" in the last 168 hours.  DDimer No results for input(s): "DDIMER" in the last 168 hours.   Radiology    CT HEAD WO CONTRAST (5MM)  Result Date: 09/12/2022 CLINICAL DATA:   Mental status change, unknown cause EXAM: CT HEAD WITHOUT CONTRAST TECHNIQUE: Contiguous axial images were obtained from the base of the skull through the vertex without intravenous contrast. RADIATION DOSE REDUCTION: This exam was performed according to the departmental dose-optimization program which includes automated exposure control, adjustment of the mA and/or kV according to patient size and/or use of iterative reconstruction technique. COMPARISON:  None Available. FINDINGS: Brain: No evidence of acute infarction, hemorrhage, hydrocephalus, extra-axial collection or mass lesion/mass effect. Scattered low-density changes within the periventricular and subcortical white matter most compatible with chronic microvascular ischemic change. Vascular: No hyperdense vessel or unexpected calcification. Skull: Normal. Negative for fracture or focal lesion. Sinuses/Orbits: No acute finding. Other: None. IMPRESSION: 1. No acute intracranial abnormality. 2. Chronic microvascular ischemic change of the white matter. Electronically Signed   By: Davina Poke D.O.   On: 09/12/2022 09:35   DG CHEST PORT 1 VIEW  Result Date: 09/11/2022 CLINICAL DATA:  Shortness of breath.  COVID infection. EXAM: PORTABLE CHEST 1 VIEW COMPARISON:  09/02/2022 FINDINGS: The heart is within normal limits in size given the AP projection and portable technique. Prominent mediastinal contours somewhat accentuated by low lung volumes and the AP projection. There is central vascular congestion, peribronchial thickening and abnormal perihilar aeration along with new bilateral patchy pulmonary infiltrates no definite pleural effusions. IMPRESSION: 1. New bilateral patchy pulmonary infiltrates. 2. Central vascular congestion, peribronchial thickening and abnormal perihilar aeration. Electronically Signed   By: Marijo Sanes M.D.   On: 09/11/2022 17:32    Cardiac Studies   09/05/22 TTE  IMPRESSIONS     1. Left ventricular ejection fraction,  by estimation, is 60 to 65%. The  left ventricle has normal function. The left ventricle has no regional  wall motion abnormalities. There is mild left ventricular hypertrophy of  the basal-septal  segment. Left  ventricular diastolic function could not be evaluated.   2. Right ventricular systolic function is normal. The right ventricular  size is normal.   3. Left atrial size was mildly dilated.   4. The mitral valve is normal in structure. Trivial mitral valve  regurgitation. No evidence of mitral stenosis.   5. The aortic valve is tricuspid. Aortic valve regurgitation is not  visualized. Aortic valve sclerosis is present, with no evidence of aortic  valve stenosis.   FINDINGS   Left Ventricle: Left ventricular ejection fraction, by estimation, is 60  to 65%. The left ventricle has normal function. The left ventricle has no  regional wall motion abnormalities. The left ventricular internal cavity  size was normal in size. There is   mild left ventricular hypertrophy of the basal-septal segment. Left  ventricular diastolic function could not be evaluated due to atrial  fibrillation. Left ventricular diastolic function could not be evaluated.   Right Ventricle: The right ventricular size is normal. No increase in  right ventricular wall thickness. Right ventricular systolic function is  normal.   Left [facility]: Left atrial size was mildly dilated.   Right [facility]: Right atrial size was normal in size.   Pericardium: There is no evidence of pericardial effusion.   Mitral Valve: The mitral valve is normal in structure. Trivial mitral  valve regurgitation. No evidence of mitral valve stenosis.   Tricuspid Valve: The tricuspid valve is normal in structure. Tricuspid  valve regurgitation is trivial. No evidence of tricuspid stenosis.   Aortic Valve: The aortic valve is tricuspid. Aortic valve regurgitation is  not visualized. Aortic valve sclerosis is present, with no evidence of   aortic valve stenosis.   Pulmonic Valve: The pulmonic valve was normal in structure. Pulmonic valve  regurgitation is not visualized. No evidence of pulmonic stenosis.   Aorta: The aortic root is normal in size and structure.   Venous: The inferior vena cava was not well visualized.   IAS/Shunts: No atrial level shunt detected by color flow Doppler.   Patient Profile     72 y.o. male  with ESRD s/p subsequent kidney transplant 01/2019 followed at [facility] (subsequent CKD stage IV with Cr 2.71 in 07/2022) on chronic immunosuppressants, prostate CA s/p prostatectomy 2016, HTN, DM, prior anemia on labs who presented to the hospital with URI symptoms x2 weeks, initially improved then onset of diarrhea, recurrent cough, and weakness about 1 week ago. Was orthostatic this admission, also in new onset Afib of unclear chronicity.     Assessment & Plan    New atrial fibrillation   Patient found with atrial fibrillation this admission, unknown duration. Possibly brought on by recent acute viral illness. On admission, patient was taking Coreg. This was changed to Metoprolol for improved rate control.  Anticoagulation has been complicated by hematochezia. Eliquis held for now. HR appears fluctuant on telemetry but average ventricular rate is above 100 BPM. Patient now on Metoprolol Tartrate 76m TID though held this morning when patient was suddenly responsive to voice only. Now NPO awaiting MRI to rule out stroke.  Until neurological status is clarified, will hold metoprolol as CVA would warrant permissive hypertension. If patient needs to remain NPO, would utilize IV metoprolol  Acute hypoxic respiratory failure  Patient with acute onset of dyspnea yesterday, necessitating small amount of O2 by nasal cannula. Received IV lasix but continues to have diminished breath sounds in bases with faint crackles. 1+ bilateral lower extremity edema but no obvious JVP elevation.  Etiology infection vs CHF. Net  positive 5L this admission. Would caution against IV fluid administration and diurese once more with lasix today. Improved rate control likely to assist with respiratory and volume status.   Left facial droop  Patient with what appears to be new drooping of mouth. This is concerning in the setting of decreased responsiveness this morning as well as new acute DVT in right common femoral vein, popliteal vein, right peroneal vein.   Code stroke activated to expedite brain MRI.  Per primary team:  Hematochezia COVID pneumonia      For questions or updates, please contact [place] Please consult [email or web address] for contact info under        Signed, Lily Kocher, PA-C  09/12/2022, 11:16 AM    History and all data above reviewed.  Patient examined.  I agree with the findings as above.  He denies any new pain or SOB this afternoon. The patient exam reveals [id number]   ,  Lungs: Decreased breath sounds dependently.   ,  Abd: Positive bowel sounds, no rebound no guarding, Ext Mild edema  .  All available labs, radiology testing, previous records reviewed. Agree with documented assessment and plan.  DVT: He has a new right lower extremity DVT and is being seen by interventional radiology.  Patient reviewed: MRI is completed with results pending.  Respiratory failure: Patient did have increased dyspnea yesterday and is net +5 L.  He was given a couple of doses of Lasix and supportive oxygen and will need to be following his creatinine closely and using Lasix judiciously.  Atrial fibrillation: Not an anticoagulation candidate with recurrent bleeding.  Rate has been reasonably well-controlled on a 50 mg twice daily yesterday of metoprolol.  The third dose was held because of low heart rate.  AM dose this morning held as he was NPO. I will reduce to BID and will resume when taking POs.     Andrew Brandt  4:48 PM  09/12/2022

## 2022-09-12 NOTE — Progress Notes (Addendum)
Subjective:   Summary: Andrew Brandt is a 72 y.o. year old male currently admitted on the IMTS HD#7 for generalized weakness/diarrhea with COVID.  Overnight Events:  Rapid response yesterday for wheezing,n dyspnea. Given lasix 40 x1 for possible pulmonary edema and started on broad spectrum abx for possible pna, placed  on 2LNc. Lopressor 75 given for third dose yesterday. Remains in afib in and out of RVR with rates in 100s-120s. O2 sat stable on 2LNC ON. Before these events, had extensive decision making discussion with patient and his girlfriend, they are opting to try and get colonoscopy and use NG for prep if needed. Discussed with Dr. Benson Norway regarding plan. Will have to further delay if he clinically decompensates.  Significantly more somnolent this morning, opens eyes and tracks but does not answer questions or follow instructions. Does not seem to be in respiratory distress at this time.  Objective:  Vital signs in last 24 hours: Vitals:   09/12/22 0400 09/12/22 0500 09/12/22 0600 09/12/22 0824  BP:  (!) 143/67 134/77 137/79  Pulse:   93 86  Resp: 20 (!) 21 (!) 25 20  Temp:   98.3 F (36.8 C) 97.6 F (36.4 C)  TempSrc:   Axillary Axillary  SpO2:   98% 100%  Weight:      Height:       Supplemental O2: Room Air SpO2: 100 % O2 Flow Rate (L/min): 2 L/min   Physical Exam:  General: chronically ill-appearing elderly male, significantly more somnolent, not responding to questions or following instructions CV: normal rate, irregularly irregular rhythm, no m/r/g. Pulm: Normal respiratory effort on 2 L nasal cannula, minimal gravity dependent crackles, no wheezing at this time Abdomen: soft, nondistended, nontender to palpation. Normal BS on my exam.  Extremities: 1+ pitting edema bilaterally, of note right lower extremity is significantly more edematous compared to left lower extremity.  Extremities are cold, poorly perfused.  Right extra upper extremity  fistula intact. Skin: Pale Neuro: Somnolent, awakens to noxious stimuli and tracks briefly.  Nonverbal, not responding to questions or following instructions at this time.  PERRL, pupils are 1 mm bilaterally. CN grossly intact. Withdraws upper extremities to noxious stimuli. Does not move lower extremities. Sensation cannot be assessed, coordination testing deferred.    Filed Weights   08/26/2022 0847 09/11/22 2040  Weight: 104.3 kg 103.1 kg     Intake/Output Summary (Last 24 hours) at 09/12/2022 0948 Last data filed at 09/12/2022 0300 Gross per 24 hour  Intake 1183.33 ml  Output --  Net 1183.33 ml   Net IO Since Admission: 6,530.2 mL [09/12/22 0948]  Pertinent Labs:    Latest Ref Rng & Units 09/12/2022    8:10 AM 09/11/2022    1:11 PM 09/11/2022    1:23 AM  CBC  WBC 4.0 - 10.5 K/uL 7.7   5.9   Hemoglobin 13.0 - 17.0 g/dL 10.7  10.8  10.1   Hematocrit 39.0 - 52.0 % 34.7  33.7  31.9   Platelets 150 - 400 K/uL 166   150        Latest Ref Rng & Units 09/12/2022    8:10 AM 09/11/2022    1:23 AM 09/10/2022    3:46 AM  CMP  Glucose 70 - 99 mg/dL 102  103  108   BUN 8 - 23 mg/dL 25  21  22   $ Creatinine 0.61 - 1.24  mg/dL 1.57  1.42  1.47   Sodium 135 - 145 mmol/L 138  138  138   Potassium 3.5 - 5.1 mmol/L 4.2  4.3  3.8   Chloride 98 - 111 mmol/L 103  103  105   CO2 22 - 32 mmol/L 24  25  23   $ Calcium 8.9 - 10.3 mg/dL 9.4  9.4  9.2   Blood cultures preliminarily negative x12 hrs Lactic acid 1.8  Imaging: CT HEAD WO CONTRAST (5MM)  Result Date: 09/12/2022 CLINICAL DATA:  Mental status change, unknown cause EXAM: CT HEAD WITHOUT CONTRAST TECHNIQUE: Contiguous axial images were obtained from the base of the skull through the vertex without intravenous contrast. RADIATION DOSE REDUCTION: This exam was performed according to the departmental dose-optimization program which includes automated exposure control, adjustment of the mA and/or kV according to patient size and/or use of  iterative reconstruction technique. COMPARISON:  None Available. FINDINGS: Brain: No evidence of acute infarction, hemorrhage, hydrocephalus, extra-axial collection or mass lesion/mass effect. Scattered low-density changes within the periventricular and subcortical white matter most compatible with chronic microvascular ischemic change. Vascular: No hyperdense vessel or unexpected calcification. Skull: Normal. Negative for fracture or focal lesion. Sinuses/Orbits: No acute finding. Other: None. IMPRESSION: 1. No acute intracranial abnormality. 2. Chronic microvascular ischemic change of the white matter. Electronically Signed   By: Davina Poke D.O.   On: 09/12/2022 09:35   DG CHEST PORT 1 VIEW  Result Date: 09/11/2022 CLINICAL DATA:  Shortness of breath.  COVID infection. EXAM: PORTABLE CHEST 1 VIEW COMPARISON:  09/13/2022 FINDINGS: The heart is within normal limits in size given the AP projection and portable technique. Prominent mediastinal contours somewhat accentuated by low lung volumes and the AP projection. There is central vascular congestion, peribronchial thickening and abnormal perihilar aeration along with new bilateral patchy pulmonary infiltrates no definite pleural effusions. IMPRESSION: 1. New bilateral patchy pulmonary infiltrates. 2. Central vascular congestion, peribronchial thickening and abnormal perihilar aeration. Electronically Signed   By: Marijo Sanes M.D.   On: 09/11/2022 17:32     EKG: My EKG interpretation is as follows: A-fib in and out of RVR  Assessment/Plan:   Principal Problem:   COVID-19 virus infection   Patient Summary: Andrew Brandt is a 72 year old with a history of ESRD s/p renal transplant on immunosuppression, prostate cancer s/p prostatectomy, T2DM and hypertension who presents via EMS for generalized weakness, diarrhea and poor appetite and found to have COVID-19 infection.    #Multifactorial encephalopathy Patient significantly more confused this  morning.  Only focal finding on exam was lack of lower extremity movement bilaterally.  He also has multiple other possible etiologies including metabolic derangements, ongoing infection such as possible pneumonia below, GI bleeding, possible acute infarct with A-fib with and anticoagulation currently held, seizure, delirium.  He does not seem to be in respiratory distress on exam, oxygenating well on 2 L nasal cannula.  A-fib is better rate controlled this morning and he is hemodynamically stable.  He was started on broad-spectrum antibiotics yesterday and does not currently have lactic acidosis, is not febrile, and does not have a leukocytosis though of note he is currently immunosuppressed.  His hemoglobin stable this morning.  CBG was 84, BMP is unremarkable.  CT head is also unremarkable for any acute pathology. - Will follow-up with stat MRI brain to rule out acute infarct.  Consider EEG afterwards.  Consider neuro consult. -if all of the above are negative he likely has hypoactive delirium due to multifactorial etiologies  including possible ongoing GI bleeding, possible pneumonia with antibiotic treatment ongoing.  Will place precautions - N.p.o., holding SSI, will administer 1 amp of D50 now.  #Painless hematochezia #Acute blood loss anemia #Hx of diverticulosis Eliquis held in setting of GIB.  Per RN, he had at least 2 bloody bowel movements overnight.  Tentative plan for prep and colonoscopy on Friday 2/23 pending clinical stability.  -Plan is for MiraLAX x 3 today and then NG tube in place tomorrow for prep.  Holding this in setting of encephalopathy as above. -Greatly appreciate GI assistance and recommendations -trend CBC, transfuse for hgb <7 and/or hemodynamic instability -Hold Eliquis  #Concern for pneumonia #Pulmonary edema #COVID-19 infection #Generalized weakness #Diarrhea Remains afebrile without leukocytosis.  Chest x-ray yesterday shows new patchy bilateral infiltrates with  congestion along the hilum bilaterally.  He does have some volume overload on exam so some of this could be pulmonary edema boots were stopped and he was given one-time dose of Lasix 40 mg yesterday which will be repeated today.  Also opted to start antibiotics yesterday given his immunosuppression as well and concern for pneumonia  He has been afebrile, without leukocytosis and no lactic acidosis today, but he likely would not have a robust immune response given his chronic suppression and debility.  If he does not seem to be improving on vancomycin and cefepime, would consider changing his dose of Bactrim to therapeutic for possible PJP.  He is comfortable on 2 L nasal cannula. -IV Lasix 40 mg again today. -Continue Vanco, cefepime.  Considered increased dose of Bactrim as needed -Follow-up blood cultures - DuoNebs every 4 hours as needed -O2 supplementation as needed -Incentive spirometer, pulm toilet -Mucinex 600 mg twice daily -PT/OT re-eval   #BLE edema Asymmetric R>L BLE edema in setting of hospitalization and held VTE. Will f\u DVT study.  #New onset paroxysmal A-fib Was in RVR consistently later yesterday when rapid was called likely due to ongoing respiratory distress and possible continued GI bleeding.  Rates better controlled overnight after receiving metoprolol 75 mg last night for third dose of the day.  Unfortunately, I have to continue to hold anticoagulation in the setting of GI bleed. - Appreciate any further cardiology recs regarding rate control.  He should follow-up with cardiology later this month. - Trend mag, BMP, replete as needed - Hold Eliquis  #ESRD s/p renal transplant #AKI on CKD4? Kidney function stable.  Per [facility] transplant, his belatacept administration can wait 6 weeks from last one.  If he is not able to get dose by next Thursday, 2/29, will reach back out for alternatives. -Mycophenolate 180 mg twice daily -Prednisone 5 mg daily -Bactrim 400-80 mg every  Monday, Wednesday and Friday - Appreciate [facility] assistance with immunosuppression   #HTN Stable on metoprolol tartate 54m TID as above.   #T2DM Per spouse, patient was recently diagnosed with diabetes and started on metformin 500 mg twice daily. A1c 7.0 6 months ago, 6.1 here. -SSI w/ meals, CBG monitoring -Hold metformin  Diet: Heart Healthy IVF:  VTE: held in setting of gi bleed. Code: Full PT/OT recs: Pending TOC recs:  Family Update:   Dispo: Anticipated discharge pending clinical stability and ongoing workup  SLinus Galas MD PGY-1 Internal Medicine Resident Pager: [phone] Please contact the on call pager after 5 pm and on weekends at [phone]

## 2022-09-12 NOTE — Progress Notes (Signed)
Patient alert to voice only. VSS. Rapid reponse RN notified. Agricultural consultant notified. IM residents notified.  Daymon Larsen, RN

## 2022-09-12 NOTE — Progress Notes (Signed)
Chief Complaint: Patient was seen in consultation today for IVC filter   Referring Physician(s): Dr. Virl Axe  Supervising Physician: Michaelle Birks  Patient Status: [facility] - In-pt  History of Present Illness: Andrew Brandt is a 72 y.o. male with hx of A. Fib. Had been placed on Eliquis but developed hematochezia. Now admitted with AMS, possible CVA. Covid + as well. Pt now with acute RLE DVT. Given his ongoing hematochezia, cannot anticoagulate. IR is asked to place IVC filter.  PMHx, meds, labs, imaging, allergies reviewed. Has been NPO today. Spoke to significant other Sherrill Raring via telephone   Past Medical History:  Diagnosis Date   Cancer [facility] 2017   prostate cancer   Chronic kidney disease    Dialysis Tues-Thurs.Saturday [facility] [place]   Constipation    Diabetes mellitus ([place])    Headache    occ   History of kidney transplant    Hypertension    Neck pain    pulled muscle in neck on cyclobenzaprine prn    Past Surgical History:  Procedure Laterality Date   AV FISTULA PLACEMENT Left 11/23/2013   Procedure: ARTERIOVENOUS (AV) FISTULA CREATION VS. GRAFT;  Surgeon: Rosetta Posner, MD;  Location: [place];  Service: Vascular;  Laterality: Left;   AV FISTULA PLACEMENT Left 07/12/2017   Procedure: INSERTION OF ARTERIOVENOUS (AV) GORE-TEX GRAFT ARM LEFT UPPER ARM;  Surgeon: Elam Dutch, MD;  Location: [facility] OR;  Service: Vascular;  Laterality: Left;   AV FISTULA PLACEMENT Right 10/21/2017   Procedure: ARTERIOVENOUS ARM (AV) FISTULA CREATION;  Surgeon: Elam Dutch, MD;  Location: [place];  Service: Vascular;  Laterality: Right;   COLONOSCOPY WITH PROPOFOL N/A 03/25/2015   Procedure: COLONOSCOPY WITH PROPOFOL;  Surgeon: Carol Ada, MD;  Location: [facility];  Service: Endoscopy;  Laterality: N/A;   COLONOSCOPY WITH PROPOFOL N/A 05/09/2018   Procedure: COLONOSCOPY WITH PROPOFOL;  Surgeon: Carol Ada, MD;  Location: [facility];  Service:  Endoscopy;  Laterality: N/A;  aborted due to prep   INSERTION OF DIALYSIS CATHETER N/A 11/23/2013   Procedure: INSERTION OF DIALYSIS CATHETER RIGHT INTERNAL JUGULAR VEIN;  Surgeon: Rosetta Posner, MD;  Location: [place];  Service: Vascular;  Laterality: N/A;   PROSTATECTOMY     THROMBECTOMY AND REVISION OF ARTERIOVENTOUS (AV) GORETEX  GRAFT Left 09/02/2017   Procedure: THROMBECTOMY  OF Left Arm  ARTERIOVENTOUS (AV) GORETEX  GRAFT;  Surgeon: Angelia Mould, MD;  Location: [place];  Service: Vascular;  Laterality: Left;    Allergies: Patient has no known allergies.  Medications:  Current Facility-Administered Medications:    acetaminophen (TYLENOL) tablet 650 mg, 650 mg, Oral, Q6H PRN, 650 mg at 09/09/22 2256 **OR** acetaminophen (TYLENOL) suppository 650 mg, 650 mg, Rectal, Q6H PRN, Coy Saunas, Charisse March, MD   benzonatate (TESSALON) capsule 100 mg, 100 mg, Oral, TID PRN, Angelique Blonder, DO, 100 mg at 09/09/22 2255   ceFEPIme (MAXIPIME) 2 g in sodium chloride 0.9 % 100 mL IVPB, 2 g, Intravenous, Q12H, Franky Macho, RPH, Last Rate: 200 mL/hr at 09/12/22 0830, 2 g at 09/12/22 0830   Chlorhexidine Gluconate Cloth 2 % PADS 6 each, 6 each, Topical, Daily, Dareen Piano, Nischal, MD, 6 each at 09/12/22 0830   feeding supplement (ENSURE ENLIVE / ENSURE PLUS) liquid 237 mL, 237 mL, Oral, BID BM, Lacinda Axon, MD, 237 mL at 09/10/22 1113   guaiFENesin (MUCINEX) 12 hr tablet 600 mg, 600 mg, Oral, BID, [place], Charisse March, MD, 600 mg  at 09/11/22 2212   levalbuterol (XOPENEX) nebulizer solution 0.63 mg, 0.63 mg, Nebulization, Q4H PRN, Aldine Contes, MD, 0.63 mg at 09/11/22 1627   levalbuterol (XOPENEX) nebulizer solution 0.63 mg, 0.63 mg, Nebulization, Q6H, Dareen Piano, Nischal, MD, 0.63 mg at 09/12/22 0158   metoprolol tartrate (LOPRESSOR) tablet 50 mg, 50 mg, Oral, TID, Virl Axe, MD   mycophenolate (MYFORTIC) EC tablet 180 mg, 180 mg, Oral, BID, Virl Axe, MD, 180 mg at 09/11/22 2212    ondansetron (ZOFRAN) tablet 4 mg, 4 mg, Oral, Q6H PRN **OR** ondansetron (ZOFRAN) injection 4 mg, 4 mg, Intravenous, Q6H PRN, Lacinda Axon, MD   Oral care mouth rinse, 15 mL, Mouth Rinse, PRN, Dareen Piano, Nischal, MD   predniSONE (DELTASONE) tablet 5 mg, 5 mg, Oral, Q breakfast, Virl Axe, MD, 5 mg at 09/11/22 0946   senna-docusate (Senokot-S) tablet 1 tablet, 1 tablet, Oral, QHS PRN, Lacinda Axon, MD   sulfamethoxazole-trimethoprim (BACTRIM) 400-80 MG per tablet 1 tablet, 1 tablet, Oral, Q M,W,F, Virl Axe, MD, 1 tablet at 09/10/22 1112   vancomycin (VANCOCIN) IVPB 1000 mg/200 mL premix, 1,000 mg, Intravenous, Q24H, Amend, Sanda Klein, RPH    Family History  Problem Relation Age of Onset   Cancer Mother    Diabetes Other        grandparents    Social History   Socioeconomic History   Marital status: Divorced    Spouse name: Not on file   Number of children: Not on file   Years of education: Not on file   Highest education level: Not on file  Occupational History   Not on file  Tobacco Use   Smoking status: Former    Types: Cigarettes   Smokeless tobacco: Never   Tobacco comments:    quit smoking cigaretees > 40 years  Vaping Use   Vaping Use: Never used  Substance and Sexual Activity   Alcohol use: Yes    Comment: Occ. 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  All other systems are negative.  Review of  Systems  Vital Signs: BP (!) 168/91 (BP Location: Left Leg)   Pulse (!) 104   Temp (!) 97.4 F (36.3 C) (Oral)   Resp 18   Ht 5' 9"$  (1.753 m)   Wt 227 lb 4.7 oz (103.1 kg)   SpO2 99%   BMI 33.57 kg/m   Physical Exam Constitutional:      General: He is not in acute distress.    Appearance: He is ill-appearing. He is not toxic-appearing.  HENT:     Mouth/Throat:     Mouth: Mucous membranes are moist.     Pharynx: Oropharynx is clear.  Cardiovascular:     Rate and Rhythm: Normal rate. Rhythm irregular.     Heart sounds: Normal heart sounds.  Pulmonary:  Effort: Pulmonary effort is normal. No respiratory distress.     Breath sounds: Normal breath sounds.  Neurological:     Comments: Awake, oriented to name/place. Follows some commands       Imaging: VAS [place] LOWER EXTREMITY VENOUS (DVT)  Result Date: 09/12/2022  Lower Venous DVT Study Patient Name:  KIONE SCHLOSSBERG  Date of Exam:   09/12/2022 Medical Rec #: [id number]         Accession #:    [id number] Date of Birth: 1951/03/13         Patient Gender: M Patient Age:   54 years Exam Location:  [facility] Procedure:      VAS [place] LOWER EXTREMITY VENOUS (DVT) Referring Phys: NISCHAL NARENDRA --------------------------------------------------------------------------------  Indications: Edema RT>LT, Covid-19, shortness of breath.  Comparison Study: No prior studies. Performing Technologist: Darlin Coco RDMS, RVT  Examination Guidelines: A complete evaluation includes B-mode imaging, spectral Doppler, color Doppler, and power Doppler as needed of all accessible portions of each vessel. Bilateral testing is considered an integral part of a complete examination. Limited examinations for reoccurring indications may be performed as noted. The reflux portion of the exam is performed with the patient in reverse Trendelenburg.  +---------+---------------+---------+-----------+----------+--------------+ RIGHT     CompressibilityPhasicitySpontaneityPropertiesThrombus Aging +---------+---------------+---------+-----------+----------+--------------+ CFV      Partial        Yes      Yes                  Acute          +---------+---------------+---------+-----------+----------+--------------+ SFJ      Full                                                        +---------+---------------+---------+-----------+----------+--------------+ FV Prox  None           No       No                   Acute          +---------+---------------+---------+-----------+----------+--------------+ FV Mid   None           No       No                   Acute          +---------+---------------+---------+-----------+----------+--------------+ FV DistalNone           No       No                   Acute          +---------+---------------+---------+-----------+----------+--------------+ PFV      Full                                                        +---------+---------------+---------+-----------+----------+--------------+ POP      None           No       No                   Acute          +---------+---------------+---------+-----------+----------+--------------+ PTV  Full                                                        +---------+---------------+---------+-----------+----------+--------------+ PERO     Partial        Yes      Yes                  Acute          +---------+---------------+---------+-----------+----------+--------------+   +---------+---------------+---------+-----------+----------+--------------+ LEFT     CompressibilityPhasicitySpontaneityPropertiesThrombus Aging +---------+---------------+---------+-----------+----------+--------------+ CFV      Full           Yes      Yes                                 +---------+---------------+---------+-----------+----------+--------------+ SFJ      Full                                                         +---------+---------------+---------+-----------+----------+--------------+ FV Prox  Full                                                        +---------+---------------+---------+-----------+----------+--------------+ FV Mid   Full                                                        +---------+---------------+---------+-----------+----------+--------------+ FV DistalFull                                                        +---------+---------------+---------+-----------+----------+--------------+ PFV      Full                                                        +---------+---------------+---------+-----------+----------+--------------+ POP      Full           Yes      Yes                                 +---------+---------------+---------+-----------+----------+--------------+ PTV      Full                                                        +---------+---------------+---------+-----------+----------+--------------+ PERO  Full                                                        +---------+---------------+---------+-----------+----------+--------------+     Summary: RIGHT: - Findings consistent with acute deep vein thrombosis involving the right common femoral vein, right femoral vein, right popliteal vein, and right peroneal veins. - No cystic structure found in the popliteal fossa. - Common femoral vein obstruction doesn't appear to extend above inguinal ligament.  LEFT: - There is no evidence of deep vein thrombosis in the lower extremity.  - No cystic structure found in the popliteal fossa.  *See table(s) above for measurements and observations.    Preliminary    CT HEAD WO CONTRAST (5MM)  Result Date: 09/12/2022 CLINICAL DATA:  Mental status change, unknown cause EXAM: CT HEAD WITHOUT CONTRAST TECHNIQUE: Contiguous axial images were obtained from the base of the skull through the vertex without intravenous contrast.  RADIATION DOSE REDUCTION: This exam was performed according to the departmental dose-optimization program which includes automated exposure control, adjustment of the mA and/or kV according to patient size and/or use of iterative reconstruction technique. COMPARISON:  None Available. FINDINGS: Brain: No evidence of acute infarction, hemorrhage, hydrocephalus, extra-axial collection or mass lesion/mass effect. Scattered low-density changes within the periventricular and subcortical white matter most compatible with chronic microvascular ischemic change. Vascular: No hyperdense vessel or unexpected calcification. Skull: Normal. Negative for fracture or focal lesion. Sinuses/Orbits: No acute finding. Other: None. IMPRESSION: 1. No acute intracranial abnormality. 2. Chronic microvascular ischemic change of the white matter. Electronically Signed   By: Davina Poke D.O.   On: 09/12/2022 09:35   DG CHEST PORT 1 VIEW  Result Date: 09/11/2022 CLINICAL DATA:  Shortness of breath.  COVID infection. EXAM: PORTABLE CHEST 1 VIEW COMPARISON:  09/09/2022 FINDINGS: The heart is within normal limits in size given the AP projection and portable technique. Prominent mediastinal contours somewhat accentuated by low lung volumes and the AP projection. There is central vascular congestion, peribronchial thickening and abnormal perihilar aeration along with new bilateral patchy pulmonary infiltrates no definite pleural effusions. IMPRESSION: 1. New bilateral patchy pulmonary infiltrates. 2. Central vascular congestion, peribronchial thickening and abnormal perihilar aeration. Electronically Signed   By: Marijo Sanes M.D.   On: 09/11/2022 17:32   ECHOCARDIOGRAM COMPLETE  Result Date: 09/05/2022    ECHOCARDIOGRAM REPORT   Patient Name:   MORGUN GLENNY Date of Exam: 09/05/2022 Medical Rec #:  [id number]        Height:       69.0 in Accession #:    [id number]       Weight:       230.0 lb Date of Birth:  02-Jun-1951        BSA:           2.192 m Patient Age:    66 years         BP:           108/72 mmHg Patient Gender: M                HR:           94 bpm. Exam Location:  Inpatient Procedure: 2D Echo, Cardiac Doppler and Color Doppler Indications:    I48.91* Unspeicified atrial fibrillation  History:  Patient has no prior history of Echocardiogram examinations.                 Risk Factors:Diabetes and Hypertension.  Sonographer:    Phineas Douglas Referring Phys: [id number] [place]  1. Left ventricular ejection fraction, by estimation, is 60 to 65%. The left ventricle has normal function. The left ventricle has no regional wall motion abnormalities. There is mild left ventricular hypertrophy of the basal-septal segment. Left ventricular diastolic function could not be evaluated.  2. Right ventricular systolic function is normal. The right ventricular size is normal.  3. Left atrial size was mildly dilated.  4. The mitral valve is normal in structure. Trivial mitral valve regurgitation. No evidence of mitral stenosis.  5. The aortic valve is tricuspid. Aortic valve regurgitation is not visualized. Aortic valve sclerosis is present, with no evidence of aortic valve stenosis. FINDINGS  Left Ventricle: Left ventricular ejection fraction, by estimation, is 60 to 65%. The left ventricle has normal function. The left ventricle has no regional wall motion abnormalities. The left ventricular internal cavity size was normal in size. There is  mild left ventricular hypertrophy of the basal-septal segment. Left ventricular diastolic function could not be evaluated due to atrial fibrillation. Left ventricular diastolic function could not be evaluated. Right Ventricle: The right ventricular size is normal. No increase in right ventricular wall thickness. Right ventricular systolic function is normal. Left Atrium: Left atrial size was mildly dilated. Right Atrium: Right atrial size was normal in size. Pericardium: There is no evidence of  pericardial effusion. Mitral Valve: The mitral valve is normal in structure. Trivial mitral valve regurgitation. No evidence of mitral valve stenosis. Tricuspid Valve: The tricuspid valve is normal in structure. Tricuspid valve regurgitation is trivial. No evidence of tricuspid stenosis. Aortic Valve: The aortic valve is tricuspid. Aortic valve regurgitation is not visualized. Aortic valve sclerosis is present, with no evidence of aortic valve stenosis. Pulmonic Valve: The pulmonic valve was normal in structure. Pulmonic valve regurgitation is not visualized. No evidence of pulmonic stenosis. Aorta: The aortic root is normal in size and structure. Venous: The inferior vena cava was not well visualized. IAS/Shunts: No atrial level shunt detected by color flow Doppler.  LEFT VENTRICLE PLAX 2D LVIDd:         3.80 cm      Diastology LVIDs:         2.80 cm      LV e' medial:    9.25 cm/s LV PW:         1.00 cm      LV E/e' medial:  10.1 LV IVS:        1.30 cm      LV e' lateral:   12.60 cm/s LVOT diam:     2.10 cm      LV E/e' lateral: 7.4 LV SV:         58 LV SV Index:   26 LVOT Area:     3.46 cm  LV Volumes (MOD) LV vol d, MOD A2C: 117.0 ml LV vol d, MOD A4C: 104.0 ml LV vol s, MOD A2C: 41.3 ml LV vol s, MOD A4C: 38.6 ml LV SV MOD A2C:     75.7 ml LV SV MOD A4C:     104.0 ml LV SV MOD BP:      69.8 ml RIGHT VENTRICLE RV Basal diam:  3.60 cm RV S prime:     10.70 cm/s TAPSE (M-mode): 2.0 cm LEFT ATRIUM  Index        RIGHT ATRIUM           Index LA diam:        4.10 cm 1.87 cm/m   RA Area:     21.80 cm LA Vol (A2C):   73.4 ml 33.48 ml/m  RA Volume:   56.00 ml  25.54 ml/m LA Vol (A4C):   84.5 ml 38.54 ml/m LA Biplane Vol: 80.8 ml 36.85 ml/m  AORTIC VALVE LVOT Vmax:   84.00 cm/s LVOT Vmean:  56.100 cm/s LVOT VTI:    0.167 m  AORTA Ao Root diam: 3.10 cm Ao Asc diam:  3.30 cm MITRAL VALVE               TRICUSPID VALVE MV Area (PHT): 2.64 cm    TR Peak grad:   23.2 mmHg MV Decel Time: 287 msec    TR Vmax:         241.00 cm/s MV E velocity: 93.80 cm/s                            SHUNTS                            Systemic VTI:  0.17 m                            Systemic Diam: 2.10 cm Fransico Him MD Electronically signed by Fransico Him MD Signature Date/Time: 09/05/2022/4:43:01 PM    Final    DG Chest Port 1 View  Result Date: 08/25/2022 CLINICAL DATA:  Cough EXAM: PORTABLE CHEST 1 VIEW COMPARISON:  11/23/2013 FINDINGS: Cardiac silhouette appears prominent. No pneumonia or pulmonary edema. No pneumothorax or pleural effusion. Aorta is calcified. There are thoracic degenerative changes. IMPRESSION: Enlarged cardiac silhouette.  No focal consolidation. Electronically Signed   By: Sammie Bench M.D.   On: 08/28/2022 10:36    Labs:  CBC: Recent Labs    09/09/22 0746 09/10/22 0346 09/11/22 0123 09/11/22 1311 09/12/22 0810  WBC 4.5 5.0 5.9  --  7.7  HGB 9.1* 8.9* 10.1* 10.8* 10.7*  HCT 28.5* 27.8* 31.9* 33.7* 34.7*  PLT 149* 150 150  --  166    COAGS: No results for input(s): "INR", "APTT" in the last 8760 hours.  BMP: Recent Labs    09/09/22 0746 09/10/22 0346 09/11/22 0123 09/12/22 0810  NA 138 138 138 138  K 3.6 3.8 4.3 4.2  CL 104 105 103 103  CO2 23 23 25 24  $ GLUCOSE 98 108* 103* 102*  BUN 23 22 21 $ 25*  CALCIUM 9.2 9.2 9.4 9.4  CREATININE 1.54* 1.47* 1.42* 1.57*  GFRNONAA 47* 50* 52* 46*    LIVER FUNCTION TESTS: Recent Labs    09/01/2022 0919 09/05/22 0036 09/07/22 0335 09/08/22 0152  BILITOT 1.4*  --  0.7 1.0  AST 21  --  17 14*  ALT 9  --  8 9  ALKPHOS 37*  --  36* 29*  PROT 6.3*  --  5.5* 5.3*  ALBUMIN 3.2* 2.9* 2.8* 2.5*     Assessment and Plan: Acute RLE DVT Cannot anticoagulate due to ongoing hematochezia IVC filter indicated. Labs reviewed. Known ESRD on HD Risks and benefits discussed with the patient's significant other including, but not limited to bleeding, infection, contrast induced renal failure, filter fracture or migration which  can lead to  emergency surgery or even death, strut penetration with damage or irritation to adjacent structures and caval thrombosis.  All questions were answered Consent signed and in chart.    Electronically Signed: Ascencion Dike, PA-C 09/12/2022, 1:35 PM   I spent a total of 20 minutes in face to face in clinical consultation, greater than 50% of which was counseling/coordinating care for IVC filter

## 2022-09-13 ENCOUNTER — Inpatient Hospital Stay (HOSPITAL_COMMUNITY): Payer: Medicare Other

## 2022-09-13 DIAGNOSIS — Z515 Encounter for palliative care: Secondary | ICD-10-CM

## 2022-09-13 DIAGNOSIS — E860 Dehydration: Secondary | ICD-10-CM

## 2022-09-13 DIAGNOSIS — G9340 Encephalopathy, unspecified: Secondary | ICD-10-CM | POA: Diagnosis not present

## 2022-09-13 DIAGNOSIS — U071 COVID-19: Secondary | ICD-10-CM | POA: Diagnosis not present

## 2022-09-13 DIAGNOSIS — R531 Weakness: Secondary | ICD-10-CM | POA: Diagnosis not present

## 2022-09-13 HISTORY — PX: IR IVC FILTER PLMT / S&I /IMG GUID/MOD SED: IMG701

## 2022-09-13 LAB — LIPID PANEL
Cholesterol: 177 mg/dL (ref 0–200)
HDL: 35 mg/dL — ABNORMAL LOW (ref >40)
LDL Cholesterol: 114 mg/dL — ABNORMAL HIGH (ref 0–99)
Total CHOL/HDL Ratio: 5.1 RATIO
Triglycerides: 138 mg/dL (ref <150)
VLDL: 28 mg/dL (ref 0–40)

## 2022-09-13 LAB — CBC
HCT: 30.6 % — ABNORMAL LOW (ref 39.0–52.0)
Hemoglobin: 9.3 g/dL — ABNORMAL LOW (ref 13.0–17.0)
MCH: 27.8 pg (ref 26.0–34.0)
MCHC: 30.4 g/dL (ref 30.0–36.0)
MCV: 91.6 fL (ref 80.0–100.0)
Platelets: UNDETERMINED 10*3/uL (ref 150–400)
RBC: 3.34 MIL/uL — ABNORMAL LOW (ref 4.22–5.81)
RDW: 18.6 % — ABNORMAL HIGH (ref 11.5–15.5)
WBC: 9.1 10*3/uL (ref 4.0–10.5)
nRBC: 1.7 % — ABNORMAL HIGH (ref 0.0–0.2)

## 2022-09-13 LAB — GLUCOSE, CAPILLARY
Glucose-Capillary: 105 mg/dL — ABNORMAL HIGH (ref 70–99)
Glucose-Capillary: 82 mg/dL (ref 70–99)
Glucose-Capillary: 82 mg/dL (ref 70–99)
Glucose-Capillary: 88 mg/dL (ref 70–99)
Glucose-Capillary: 91 mg/dL (ref 70–99)
Glucose-Capillary: 97 mg/dL (ref 70–99)

## 2022-09-13 LAB — RENAL FUNCTION PANEL
Albumin: 2.2 g/dL — ABNORMAL LOW (ref 3.5–5.0)
Anion gap: 9 (ref 5–15)
BUN: 25 mg/dL — ABNORMAL HIGH (ref 8–23)
CO2: 23 mmol/L (ref 22–32)
Calcium: 8.8 mg/dL — ABNORMAL LOW (ref 8.9–10.3)
Chloride: 107 mmol/L (ref 98–111)
Creatinine, Ser: 1.65 mg/dL — ABNORMAL HIGH (ref 0.61–1.24)
GFR, Estimated: 44 mL/min — ABNORMAL LOW (ref >60)
Glucose, Bld: 87 mg/dL (ref 70–99)
Phosphorus: 3.1 mg/dL (ref 2.5–4.6)
Potassium: 3.6 mmol/L (ref 3.5–5.1)
Sodium: 139 mmol/L (ref 135–145)

## 2022-09-13 MED ORDER — MIDAZOLAM HCL 2 MG/2ML IJ SOLN
INTRAMUSCULAR | Status: AC
  Filled 2022-09-13: qty 2

## 2022-09-13 MED ORDER — FENTANYL CITRATE (PF) 100 MCG/2ML IJ SOLN
INTRAMUSCULAR | Status: AC
  Filled 2022-09-13: qty 2

## 2022-09-13 MED ORDER — METOPROLOL TARTRATE 5 MG/5ML IV SOLN
5.0000 mg | Freq: Once | INTRAVENOUS | Status: AC
  Administered 2022-09-13: 5 mg via INTRAVENOUS
  Filled 2022-09-13: qty 5

## 2022-09-13 MED ORDER — METRONIDAZOLE 500 MG/100ML IV SOLN
500.0000 mg | Freq: Three times a day (TID) | INTRAVENOUS | Status: DC
  Administered 2022-09-13 – 2022-09-15 (×5): 500 mg via INTRAVENOUS
  Filled 2022-09-13 (×7): qty 100

## 2022-09-13 MED ORDER — IOHEXOL 300 MG/ML  SOLN: 100.0000 mL | Freq: Once | INTRAMUSCULAR | Status: DC | PRN

## 2022-09-13 MED ORDER — LIDOCAINE HCL 1 % IJ SOLN
INTRAMUSCULAR | Status: AC
  Filled 2022-09-13: qty 20

## 2022-09-13 MED ORDER — FENTANYL CITRATE (PF) 100 MCG/2ML IJ SOLN
INTRAMUSCULAR | Status: AC | PRN
  Administered 2022-09-13: 25 ug via INTRAVENOUS

## 2022-09-13 MED ORDER — METOPROLOL TARTRATE 50 MG PO TABS: 50.0000 mg | ORAL_TABLET | Freq: Three times a day (TID) | ORAL | Status: DC

## 2022-09-13 MED ORDER — IOHEXOL 300 MG/ML  SOLN: 50.0000 mL | Freq: Once | INTRAMUSCULAR | Status: DC | PRN

## 2022-09-13 MED ORDER — METOPROLOL TARTRATE 50 MG PO TABS
75.0000 mg | ORAL_TABLET | Freq: Two times a day (BID) | ORAL | Status: DC
  Administered 2022-09-13 – 2022-09-14 (×3): 75 mg via ORAL
  Filled 2022-09-13 (×3): qty 1

## 2022-09-13 MED ORDER — MIDAZOLAM HCL 2 MG/2ML IJ SOLN
INTRAMUSCULAR | Status: AC | PRN
  Administered 2022-09-13 (×2): .5 mg via INTRAVENOUS

## 2022-09-13 NOTE — Progress Notes (Signed)
Subjective:   Summary: Andrew Brandt is a 72 y.o. year old male currently admitted on the IMTS HD#8 for generalized weakness/diarrhea with COVID.  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  Remains in A-fib with RVR rates 120s to 140s.  Given one-time dose of IV metoprolol 5 mg.  Of note, multiple metoprolol doses held yesterday and context of possible stroke workup, n.p.o., etc.  Update: Went back to discuss events/GOC with patient and his significant other. We discussed events during the admission and multiple complications including recurrent GIB, DVT, PNA, encephalopathy, general worsening debility, as well as CT abdomen yesterday showing concern for colon neoplasm. We discussed that it cannot be confirmed without tissue biopsy that the mass is cancer without tissue biopsy but that it most likely is given his symptoms and clinical course, and regardless of cancer diagnosis, his overall hospital course and decline are concern and prognosis/likelihood of full functional recovery are poor. There are few treatment options. Paused at this point and answered all questions. Unfortunately, Andrew Brandt is minimally verbal and does not contribute much to conversation. His significant other has been making decisions for him which he has previously indicated is ok with him. We discussed code status and GOC and his girlfriend mentioned that she does not believe he would want to remain in hospital especially if his functional status continues to decline and that she does not believe resuscitation would be appropriate/that he would agree. She does want to discuss case with the patient's sister and arrive at a consensus so requests that we wait until that point to make concrete changes. I offered to have palliative team see them and they are amenable.  Objective:  Vital signs  in last 24 hours: Vitals:   09/13/22 0044 09/13/22 0200 09/13/22 0357 09/13/22 0500  BP: 124/84   (!) 140/81  Pulse: (!) 117 (!) 108 (!) 110 (!) 120  Resp: 20 (!) 25 (!) 23 20  Temp: 97.8 F (36.6 C)   97.9 F (36.6 C)  TempSrc: Oral   Oral  SpO2: 92% 97% 100% 99%  Weight:      Height:       Supplemental O2: Room Air SpO2: 99 % O2 Flow Rate (L/min): 2 L/min   Physical Exam:  General: chronically ill-appearing elderly male, somnolent but interactive today CV: normal rate, irregularly irregular rhythm, no m/r/g. Pulm: Normal respiratory effort on 2 L nasal cannula, minimal gravity dependent crackles, minimal wheezing at this time Abdomen: soft, nondistended, nontender to palpation.  Extremities: 2+ pitting edema bilaterally, of note right lower extremity is significantly more edematous compared to left lower extremity.  Extremities are cold, poorly perfused.  Right extra upper extremity fistula intact. Skin: Pale Neuro: Somnolent, minimally verbal but does answer questions. Mild dysarthria. CNs intact grossly. Mild L facial droop. BLE srength 3/5. BUE 4/5. Sensation intact diffusely.     Filed Weights   09/07/2022 0847 09/11/22 2040  Weight: 104.3 kg 103.1 kg     Intake/Output Summary (Last 24 hours) at 09/13/2022 0646 Last data filed at 09/12/2022 2105 Gross per 24 hour  Intake 0 ml  Output 700 ml  Net -700 ml    Net IO Since Admission: 5,830.2 mL [09/13/22 0646]  Pertinent Labs:    Latest Ref Rng & Units 09/13/2022    2:09  AM 09/12/2022    8:10 AM 09/11/2022    1:11 PM  CBC  WBC 4.0 - 10.5 K/uL 9.1  7.7    Hemoglobin 13.0 - 17.0 g/dL 9.3  10.7  10.8   Hematocrit 39.0 - 52.0 % 30.6  34.7  33.7   Platelets 150 - 400 K/uL PLATELET CLUMPS NOTED ON SMEAR, UNABLE TO ESTIMATE  166         Latest Ref Rng & Units 09/12/2022    8:10 AM 09/11/2022    1:23 AM 09/10/2022    3:46 AM  CMP  Glucose 70 - 99 mg/dL 102  103  108   BUN 8 - 23 mg/dL 25  21  22   $ Creatinine 0.61 -  1.24 mg/dL 1.57  1.42  1.47   Sodium 135 - 145 mmol/L 138  138  138   Potassium 3.5 - 5.1 mmol/L 4.2  4.3  3.8   Chloride 98 - 111 mmol/L 103  103  105   CO2 22 - 32 mmol/L 24  25  23   $ Calcium 8.9 - 10.3 mg/dL 9.4  9.4  9.2   Blood cultures preliminarily negative x12 hrs Lactic acid 1.8 LDL 114  Imaging: CT ABDOMEN PELVIS WO CONTRAST  Result Date: 09/13/2022 CLINICAL DATA:  Bowel obstruction suspected. X-ray yesterday showing sigmoid dilatation concerning for volvulus. EXAM: CT ABDOMEN AND PELVIS WITHOUT CONTRAST TECHNIQUE: Multidetector CT imaging of the abdomen and pelvis was performed following the standard protocol without IV contrast. RADIATION DOSE REDUCTION: This exam was performed according to the departmental dose-optimization program which includes automated exposure control, adjustment of the mA and/or kV according to patient size and/or use of iterative reconstruction technique. COMPARISON:  Portable flat plate abdomen study yesterday at 5:49 p.m., and CT abdomen pelvis with oral contrast 07/31/2018. FINDINGS: Lower chest: There is mild cardiomegaly. No pericardial effusion or visible significant calcific CAD. There are small layering pleural effusions. There are small areas of ground-glass disease in the medial segment of the right middle lobe and in the peripheral lingula suggesting a viral pneumonitis or ground-glass edema. There is consolidation or atelectasis anterior to the effusions in the lower lobes. Hepatobiliary: Not well seen due to abundant breathing motion artifact, and streak artifact from the patient's arms in the field. Small scattered hepatic cysts are again faintly visible. There is no obvious mass. Gallbladder contains dense layering fluid proximally and is air distended distally, potentially indicating evidence of prior sphincterotomy. Other etiologies are possible. There is no wall thickening or biliary dilatation. The dense layering material could be dense sludge,  gravel, or contrast and is not well characterized due to breathing motion. Pancreas: Partially atrophic, without contrast otherwise unremarkable. Spleen: Unremarkable without contrast. Adrenals/Urinary Tract: End-stage renal atrophy. The cortex replaced by numerous cysts varying in density. Largest on the left is 1.5 cm in the upper pole. Largest on the right is 1.7 cm in the upper pole. Due to breathing motion cyst density characterization unable to be performed. There is a transplant kidney in the right iliac fossa. No focal abnormality of the unenhanced cortex is seen. No intrarenal stone is seen. There is mild hydroureteronephrosis without evidence of ureteral stone. There is no adrenal mass. No native urinary stone or obstruction. Unremarkable bladder for the degree of distention. Stomach/Bowel: The stomach is contracted. There are thickened folds and mild stranding involving the descending duodenum consistent with duodenitis. The remainder of unopacified small bowel is unremarkable. The appendix is normal. There is mild gas  distention without dilatation in the transverse colon. The ascending and descending colon largely decompressed. There is advanced diverticulosis in the distal descending and proximal sigmoid colon with increased inflammatory stranding over portions consistent with acute diverticulitis. At about the junction of the mid and distal thirds of the sigmoid segment there is circumferential wall thickening left of the midline on 3:48 and an abrupt caliber change to dilatation in the distal third of the sigmoid which is dilated up to 14 cm. The mid sigmoid leading up to this is tortuous but does not appear twisted such as with volvulus. The wall thickening at the point of transition is concerning for infiltrating neoplasm. Distal this there is mild-to-moderate retained stool in the rectum including a 4.8 cm densely rim calcified stool ball, and there is mild thickening of the rectum without  pneumatosis. Vascular/Lymphatic: Aortic atherosclerosis. No enlarged abdominal or pelvic lymph nodes. Reproductive: Prior prostatectomy. Other: Minimal ascites posterior deep pelvis. No free air, abscess or free bleed. The there are no incarcerated hernias. Musculoskeletal: Bridging syndesmophytes thoracic spine. Osteopenia and degenerative change lumbar spine. Acquired spinal stenosis L4-5. IMPRESSION: 1. 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 The mid sigmoid leading up to this is tortuous but does not appear twisted such as with volvulus. There is mild thickening of the rectum and mild-to-moderate retained stool in the rectum. 2. Descending and proximal sigmoid advanced diverticulosis with evidence of acute diverticulitis over portions. 3. No free air, abscess or free bleed. 4. Small pleural effusions with ground-glass disease in the right middle lobe and lingula which could be due to ground-glass edema or viral pneumonitis, atelectasis or pneumonia in the lower lobes about the effusions. 5. Cardiomegaly. 6. End-stage native renal atrophy with transplant kidney in the right iliac fossa. There is mild hydroureteronephrosis of the transplant kidney without evidence of ureteral stone. The transplant is new from 2020. 7. Aortic atherosclerosis. 8. Small hepatic cysts. 9. Air distended gallbladder with dense layering material, could be dense sludge, gravel, or contrast. Etiology of the air in the gallbladder unknown but there is no wall thickening or biliary dilatation. Has the patient had a prior sphincterotomy? 10. Prior prostatectomy. 11. Osteopenia and degenerative change. Acquired spinal stenosis L4-5. Thoracic spine bridging syndesmophytes. Aortic Atherosclerosis (ICD10-I70.0). Electronically Signed   By: Telford Nab M.D.   On: 09/13/2022 01:27   VAS [place] LOWER EXTREMITY VENOUS  (DVT)  Result Date: 09/12/2022  Lower Venous DVT Study Patient Name:  OSAMU MAJEWSKI  Date of Exam:   09/12/2022 Medical Rec #: [id number]         Accession #:    [id number] Date of Birth: 1950-09-02         Patient Gender: M Patient Age:   39 years Exam Location:  [facility] Procedure:      VAS [place] LOWER EXTREMITY VENOUS (DVT) Referring Phys: NISCHAL NARENDRA --------------------------------------------------------------------------------  Indications: Edema RT>LT, Covid-19, shortness of breath.  Comparison Study: No prior studies. Performing Technologist: Darlin Coco RDMS, RVT  Examination Guidelines: A complete evaluation includes B-mode imaging, spectral Doppler, color Doppler, and power Doppler as needed of all accessible portions of each vessel. Bilateral testing is considered an integral part of a complete examination. Limited examinations for reoccurring indications may be performed as noted. The reflux portion of the exam is performed with the patient in reverse Trendelenburg.  +---------+---------------+---------+-----------+----------+--------------+ RIGHT  CompressibilityPhasicitySpontaneityPropertiesThrombus Aging +---------+---------------+---------+-----------+----------+--------------+ CFV      Partial        Yes      Yes                  Acute          +---------+---------------+---------+-----------+----------+--------------+ SFJ      Full                                                        +---------+---------------+---------+-----------+----------+--------------+ FV Prox  None           No       No                   Acute          +---------+---------------+---------+-----------+----------+--------------+ FV Mid   None           No       No                   Acute          +---------+---------------+---------+-----------+----------+--------------+ FV DistalNone           No       No                   Acute           +---------+---------------+---------+-----------+----------+--------------+ PFV      Full                                                        +---------+---------------+---------+-----------+----------+--------------+ POP      None           No       No                   Acute          +---------+---------------+---------+-----------+----------+--------------+ PTV      Full                                                        +---------+---------------+---------+-----------+----------+--------------+ PERO     Partial        Yes      Yes                  Acute          +---------+---------------+---------+-----------+----------+--------------+   +---------+---------------+---------+-----------+----------+--------------+ LEFT     CompressibilityPhasicitySpontaneityPropertiesThrombus Aging +---------+---------------+---------+-----------+----------+--------------+ CFV      Full           Yes      Yes                                 +---------+---------------+---------+-----------+----------+--------------+ SFJ      Full                                                        +---------+---------------+---------+-----------+----------+--------------+  FV Prox  Full                                                        +---------+---------------+---------+-----------+----------+--------------+ FV Mid   Full                                                        +---------+---------------+---------+-----------+----------+--------------+ FV DistalFull                                                        +---------+---------------+---------+-----------+----------+--------------+ PFV      Full                                                        +---------+---------------+---------+-----------+----------+--------------+ POP      Full           Yes      Yes                                  +---------+---------------+---------+-----------+----------+--------------+ PTV      Full                                                        +---------+---------------+---------+-----------+----------+--------------+ PERO     Full                                                        +---------+---------------+---------+-----------+----------+--------------+     Summary: RIGHT: - Findings consistent with acute deep vein thrombosis involving the right common femoral vein, right femoral vein, right popliteal vein, and right peroneal veins. - No cystic structure found in the popliteal fossa. - Common femoral vein obstruction doesn't appear to extend above inguinal ligament.  LEFT: - There is no evidence of deep vein thrombosis in the lower extremity.  - No cystic structure found in the popliteal fossa.  *See table(s) above for measurements and observations. Electronically signed by Jamelle Haring on 09/12/2022 at 10:03:53 PM.    Final    DG Abd Portable 1V  Result Date: 09/12/2022 CLINICAL DATA:  Encephalopathy EXAM: PORTABLE ABDOMEN - 1 VIEW COMPARISON:  CT 07/30/2018 FINDINGS: Diffuse air-filled colon identified. There is a dilated loop of presumed sigmoid colon extending into the midabdomen which has a diameter of up to 10.7 cm. Prior CT scan also demonstrated a tortuous dilated colon with diameter of up to 8.2 cm. More proximally the colon is air-filled but nondilated as  is the small bowel. Please correlate for any clinical evidence of developing volvulus or obstruction. No obvious free air on these portable supine radiographs. There is a rounded structure in the central pelvis which has presumable rim calcification. This was not seen on the remote CT scan. A pelvic lesion is possible. Please correlate for any known history or dedicated workup. Critical Value/emergent results were called by telephone at the time of interpretation on 09/12/2022 at 3:08 pm to provider Dr. Dareen Piano , who verbally  acknowledged these results. IMPRESSION: Severely dilated loop of presumably sigmoid colon in the midabdomen extending out of the pelvis. More proximally the large bowel is air-filled and distended but not dilated and there are air-filled loops of small bowel. Please correlate for any developing signs of distal colonic obstruction or volvulus. There is a rim calcified rounded structure in the central pelvis measuring 5.1 cm in diameter. This was not seen on the CT scan of 2020. Etiology is uncertain. Please correlate for a calcified pelvic lesion, foreign body or other process. With these 2 findings, a CT scan may be of some benefit as clinically appropriate. IV contrast would be preferable if possible Electronically Signed   By: Jill Side M.D.   On: 09/12/2022 18:33   MR BRAIN WO CONTRAST  Result Date: 09/12/2022 CLINICAL DATA:  acute encephalopathy, afib with AC held, EXAM: MRI HEAD WITHOUT CONTRAST TECHNIQUE: Multiplanar, multiecho pulse sequences of the brain and surrounding structures were obtained without intravenous contrast. COMPARISON:  CT head from the same day. FINDINGS: Brain: No acute infarction, hemorrhage, hydrocephalus, extra-axial collection or mass lesion. Patchy T2/FLAIR hyperintensities with the white matter, nonspecific but compatible with chronic microvascular ischemic disease that is mild for patient age. Vascular: Major arterial flow voids are maintained at the skull base. Skull and upper cervical spine: Normal marrow signal. Sinuses/Orbits: Clear sinuses.  No acute orbital findings. Other: No mastoid effusions. IMPRESSION: No evidence of acute intracranial abnormality. Electronically Signed   By: Margaretha Sheffield M.D.   On: 09/12/2022 17:12   CT HEAD WO CONTRAST (5MM)  Result Date: 09/12/2022 CLINICAL DATA:  Mental status change, unknown cause EXAM: CT HEAD WITHOUT CONTRAST TECHNIQUE: Contiguous axial images were obtained from the base of the skull through the vertex without  intravenous contrast. RADIATION DOSE REDUCTION: This exam was performed according to the departmental dose-optimization program which includes automated exposure control, adjustment of the mA and/or kV according to patient size and/or use of iterative reconstruction technique. COMPARISON:  None Available. FINDINGS: Brain: No evidence of acute infarction, hemorrhage, hydrocephalus, extra-axial collection or mass lesion/mass effect. Scattered low-density changes within the periventricular and subcortical white matter most compatible with chronic microvascular ischemic change. Vascular: No hyperdense vessel or unexpected calcification. Skull: Normal. Negative for fracture or focal lesion. Sinuses/Orbits: No acute finding. Other: None. IMPRESSION: 1. No acute intracranial abnormality. 2. Chronic microvascular ischemic change of the white matter. Electronically Signed   By: Davina Poke D.O.   On: 09/12/2022 09:35     EKG: My EKG interpretation is as follows: A-fib in and out of RVR  Assessment/Plan:   Principal Problem:   COVID-19 virus infection   Patient Summary: Andrew Brandt is a 72 year old with a history of ESRD s/p renal transplant on immunosuppression, prostate cancer s/p prostatectomy, T2DM and hypertension who presents via EMS for generalized weakness, diarrhea and poor appetite and found to have COVID-19 infection.    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  CT follow-up showed mid/distal sigmoid colonic neoplasm with dilated sigmoid loops but no volvulus. Engaged [place] today given multiple complications and poor overall prognosis with limited treatment options. DNR today. -f\u palliative recs, family/patient ocnsidering comfort care/hospice -DNR  #Multifactorial encephalopathy All workup yesterday unremarkable overall. Stroke workup negative. Likely multifactorial encephalopathy with element of  delirium/debility -delirium precautions   #Painless hematochezia #Acute blood loss anemia #Hx of diverticulosis Eliquis still held. Likely colonic neoplasm and diverticulitis causing GIB based on CT. GOC as above. -Greatly appreciate GI assistance and recommendations -trend CBC, transfuse for hgb <7 and/or hemodynamic instability -Hold Eliquis  #Concern for pneumonia #Pulmonary edema #COVID-19 infection #Generalized weakness #Diarrhea Remains afebrile without leukocytosis.  If he does not seem to be improving on vancomycin and cefepime, would consider changing his dose of Bactrim to therapeutic for possible PJP.  He is comfortable on 2 L nasal cannula. -Continue Vanco, cefepime.  Considered increased dose of Bactrim as needed -Follow-up blood cultures - DuoNebs every 4 hours as needed -O2 supplementation as needed -Incentive spirometer, pulm toilet -Mucinex 600 mg twice daily -PT/OT re-eval   #BLE edema Confirm right DVT.  IR consulted for IVC filter placement.  Also will need VQ scan at some point, primarily for prognostic/diagnostic purposes as [facility] is contraindicated.  #New onset paroxysmal A-fib In and out of RVR, unsure if he could tolerate more rate control, should be lenient with multiple other comorbidities. He is HD stable. Goc as above. - Appreciate any further cardiology recs regarding rate control.  He should follow-up with cardiology later this month. - Trend mag, BMP, replete as needed - Hold Eliquis  #ESRD s/p renal transplant #AKI on CKD4? Kidney function stable.  Per [facility] transplant, his belatacept administration can wait 6 weeks from last one.  If he is not able to get dose by next Thursday, 2/29, will reach back out for alternatives. -Mycophenolate 180 mg twice daily -Prednisone 5 mg daily -Bactrim 400-80 mg every Monday, Wednesday and Friday - Appreciate [facility] assistance with immunosuppression   #HTN Stable on metoprolol tartate 13m BID as above.    #T2DM Per spouse, patient was recently diagnosed with diabetes and started on metformin 500 mg twice daily. A1c 7.0 6 months ago, 6.1 here. -SSI w/ meals, CBG monitoring -Hold metformin  Diet: Heart Healthy IVF:  VTE: held in setting of gi bleed. Code: DNR. GOC pending PT/OT recs: Pending TOC recs:  Family Update:   Dispo: Anticipated discharge pending clinical stability and ongoing workup  SLinus Galas MD PGY-1 Internal Medicine Resident Pager: [phone] Please contact the on call pager after 5 pm and on weekends at [phone]

## 2022-09-13 NOTE — Plan of Care (Signed)
MRI negative, plan as previously documented, please reach out with any questions or concerns, neurology is available as needed.  Lesleigh Noe MD-PhD [facility] [phone]

## 2022-09-13 NOTE — Progress Notes (Signed)
Called on call physician for patient who missed daytime metoprolol medication due to multiple off  unit diagnotic testing procedures; now after receiving missed earlier dose, patient continues in afib with a rate sustaining 120-130. Orders given, will continue to monitor.

## 2022-09-13 NOTE — TOC Initial Note (Signed)
Transition of Care [facility]) - Initial/Assessment Note    Patient Details  Name: Andrew Brandt MRN: [id number] Date of Birth: August 21, 1950  Transition of [facility]) CM/SW Contact:    Vinie Sill, LCSW Phone Number: 09/13/2022, 1:00 PM  Clinical Narrative:                  Per chart review patient is not oriented to situations. CSW spoke with the patient's significant other. CSW introduced self and explained role. CSW discussed therapy recommendations for short term rehab at [facility].  She states patient has never been to SNF and has no preferred SNFs at this time.  She expressed, one goal for rehab would be for the patient to walk and be mobile. She states he been out of the bed for a few days, so she is concerned about how much he can move. CSW explained the SNF process.  No other noted or concerns.   TOC will provide bed offers once available  TOC will continue to follow and assist with discharge planning.  Thurmond Butts, MSW, LCSW Clinical Social Worker    Expected Discharge Plan: Skilled Nursing Facility Barriers to Discharge: Continued Medical Work up, Ship broker, SNF Pending bed offer   Patient Goals and CMS Choice Patient states their goals for this hospitalization and ongoing recovery are:: To return home CMS Medicare.gov Compare Post Acute Care list provided to:: Patient Choice offered to / list presented to : Patient, Spouse      Expected Discharge Plan and Services In-house Referral: Clinical Social Work Discharge Planning Services: CM Consult Post Acute Care Choice: Home Health Living arrangements for the past 2 months: Single Family Home                 DME Arranged: Bedside commode, Walker rolling DME Agency: [employer] Date DME Agency Contacted: 09/06/22 Time DME Agency Contacted: 47 Representative spoke with at DME Agency: Erasmo Downer HH Arranged: PT, OT HH Agency: [place] Date [place]: 09/06/22 Time [place]: 1100 Representative spoke with at [place]: Claiborne Billings  Prior Living Arrangements/Services Living arrangements for the past 2 months: [place] with:: Self, Significant Other Patient language and need for interpreter reviewed:: No Do you feel safe going back to the place where you live?: Yes      Need for Family Participation in Patient Care: Yes (Comment) Care giver support system in place?: Yes (comment) Current home services: DME (Cane, shower seat) Criminal Activity/Legal Involvement Pertinent to Current Situation/Hospitalization: No - Comment as needed  Activities of Daily Living      Permission Sought/Granted Permission sought to share information with : Case Manager, Customer service manager, Family Supports Permission granted to share information with : Yes, Verbal Permission Granted (per chart review patient is not oriented to situation- spoke w/ sig/other)     Permission granted to share info w AGENCY: SNFs        Emotional Assessment Appearance:: Appears stated age Attitude/Demeanor/Rapport: Engaged, Gracious Affect (typically observed): Accepting, Appropriate, Calm, Hopeful, Pleasant Orientation: : Oriented to Self, Oriented to  Time, Oriented to Place Alcohol / Substance Use: Not Applicable Psych Involvement: No (comment)  Admission diagnosis:  Dehydration [E86.0] Weakness [R53.1] COVID [U07.1] COVID-19 virus infection [U07.1] Patient Active Problem List   Diagnosis Date Noted   COVID-19 virus infection 09/03/2022   End stage renal disease ([place]) 01/05/2014   Thrombocytopenia, unspecified ([place]) 11/25/2013   ESRD (end stage renal disease) ([place]) 11/20/2013   Normocytic  anemia 11/20/2013   AKI (acute kidney injury) ([place]) 11/19/2013   HTN (hypertension) 11/19/2013   PCP:  Benito Mccreedy, MD Pharmacy:   [id number] [place], [place] [address] [place] [address]Phone: [phone]: [phone] [facility] [address] [place]Phone: [phone]: [phone]    Social Determinants of Health (SDOH) Social History: SDOH Screenings   Tobacco Use: Medium Risk (09/05/2022)   SDOH Interventions: Transportation Interventions: Intervention Not Indicated, Inpatient TOC, Patient Resources (Friends/Family)   Readmission Risk Interventions     No data to display

## 2022-09-13 NOTE — Procedures (Signed)
Interventional Radiology Procedure Note  Procedure: IVC filter placement  Complications: None  Estimated Blood Loss: < 10 mL  Findings: IVC venography with CO2 demonstrates normally patent IVC. [place] IVC filter deployed in infrarenal IVC.  Venetia Night. Kathlene Cote, M.D Pager:  [phone]

## 2022-09-13 NOTE — Progress Notes (Signed)
Brief note:  After reviewing the patient's chart and assessing the patient at bedside, I spoke with patient's significant other at bedside.  She endorses she is decision-maker for patient for over 15 years and that she makes decisions jointly with patient's sister Laverne.   We discussed CODE STATUS, goals of care, and boundaries of care.  Significant other believes patient would not want resuscitative measures, defibrillation, or use of mechanical ventilation to sustain his life.  She states he would never want to be on a machine in vegetative state. CODE STATUS of full and DNR reviewed in detail.  Patient significant other has spoken to patient's sister in regards to code status. Sister and significant others are in agreement for patient to be a DNR. Code status changed to DNR.  Dr. Jodell Cipro and dayshift RN notified of change in code status.   Complete consult note to follow.   Curt Bears Dyshawn Cangelosi  NO CHARGE

## 2022-09-13 NOTE — Consult Note (Addendum)
Consultation Note Date: 09/13/2022 at 1500  Patient Name: Andrew Brandt  DOB: 1951/02/02  MRN: [id number]  Age / Sex: 72 y.o., male  PCP: Andrew Mccreedy, MD Referring Physician: Aldine Contes, MD  Reason for Consultation: Establishing goals of care  HPI/Patient Profile: 72 y.o. male  with past medical history of ESRD s/p renal transplant (immunosuppression), prostate cancer s/p prostatectomy, type 2 diabetes, and HTN admitted on 09/07/2022 with generalized weakness, diarrhea, and poor appetite. Pt found to be Covid 19 positive.  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  PMT was consulted to discuss goals of care.   Clinical Assessment and Goals of Care: I have reviewed medical records including EPIC notes, labs and imaging, assessed the patient and then met with patient and his significant other Andrew Brandt at bedside to discuss diagnosis prognosis, GOC, EOL wishes, disposition and options.  I introduced Palliative Medicine as specialized medical care for people living with serious illness. It focuses on providing relief from the symptoms and stress of a serious illness. The goal is to improve quality of life for both the patient and the family.  We discussed a brief life review of the patient.  Patient and Andrew Brandt been together for over 15 years but have known each other over 34.  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  Patient has no biological children of his own.  As far as functional and nutritional status PTA Andrew Brandt endorses patient did not leave the house and would only walk around the house for exercise.  She endorses a poor appetite that has been consistent for  several months.  Patient would take only small bites of food.  Patient has a recliner and uses a walker for ambulation around the home.  He would leave the house for doctors appointments and to take out the trash but overall lead a sedentary lifestyle in his home.  We discussed patient's current illness and what it means in the larger context of patient's on-going co-morbidities.   I attempted to elicit values and goals of care important to the patient.  Andrew Brandt shares patient is stoic and a man of few words.  He often defers any medical decision-making to Andrew Brandt, who also consults his sister.  She believes patient would never want to live in a vegetative state or to be maintained artificially by machines.  She shares with new information that he has a colon mass that she believes it would be in his best interest to be a DNR and to focus on keeping him comfortable.  She believes that aggressive measures such as chemo or surgery we do him more harm than good.    She shares she is probably still in shock in hearing the news of his colon mass but trying to accept that he is likely reaching end-of-life.  Therapeutic silence  and active listening provided for Andrew Brandt to share her thoughts and emotions.  The difference between aggressive medical intervention and comfort care was considered in light of the patient's goals of care.  Hospice and hospice philosophy discussed in detail.  Andrew Brandt shares she is interested in knowing if patient qualifies for hospice inpatient placement.  She feels overwhelmed and unable to meet his needs at home as she is the only caregiver.  Advance directives, concepts specific to code status, artificial feeding and hydration, and rehospitalization were considered and discussed.  Andrew Brandt in agreement for DNR status.  She also believes he would never be accepting of artificial nutrition and hydration.  CODE STATUS changed to DNR.  Attending and RN made aware.  Andrew Brandt would  like to speak with patient after he returns from IVC placement.  She would like the evening to discuss his new diagnosis.  However, she was clear she would like to learn more about possible options for hospice at discharge.  Ongoing discussions and support to continue.  I plan to follow-up with Andrew Brandt when she arrives at the hospital after 10 AM tomorrow, 2/23.    Primary Decision Maker OTHER  Physical Exam Vitals reviewed.  Constitutional:      General: He is not in acute distress.    Appearance: He is normal weight. He is ill-appearing.  HENT:     Head: Normocephalic.  Eyes:     Pupils: Pupils are equal, round, and reactive to light.  Cardiovascular:     Rate and Rhythm: Normal rate. Rhythm irregular.     Pulses: Normal pulses.  Pulmonary:     Effort: Pulmonary effort is normal.  Abdominal:     Palpations: Abdomen is soft.  Musculoskeletal:     Comments: Generalized weakness  Neurological:     Mental Status: He is alert.     Comments: Oriented to self and place     Palliative Assessment/Data:     Thank you for this consult. Palliative medicine will continue to follow and assist holistically.   Time Total: 75 minutes Greater than 50%  of this time was spent counseling and coordinating care related to the above assessment and plan.  Signed by: Andrew Hawks, DNP, FNP-BC Palliative Medicine    Please contact Palliative Medicine Team phone at [phone] for questions and concerns.  For individual provider: See Andrew Brandt

## 2022-09-13 NOTE — Progress Notes (Addendum)
Rounding Note    Patient Name: Andrew Brandt Date of Encounter: 09/13/2022  [place] Cardiologist: Sanda Klein, MD   Subjective   Patient is more alert this morning. He still appears to have a left mouth/facial droop and some dysarthria. Denies chest pain, shortness of breath, palpitations.   Inpatient Medications    Scheduled Meds:  Chlorhexidine Gluconate Cloth  6 each Topical Daily   feeding supplement  237 mL Oral BID BM   guaiFENesin  600 mg Oral BID   levalbuterol  0.63 mg Nebulization Q6H   metoprolol tartrate  50 mg Oral TID   mycophenolate  180 mg Oral BID   predniSONE  5 mg Oral Q breakfast   sulfamethoxazole-trimethoprim  1 tablet Oral Q M,W,F   Continuous Infusions:  ceFEPime (MAXIPIME) IV 2 g (09/12/22 2129)   metronidazole     vancomycin 1,000 mg (09/13/22 0114)   PRN Meds: acetaminophen **OR** acetaminophen, benzonatate, levalbuterol, ondansetron **OR** ondansetron (ZOFRAN) IV, mouth rinse, senna-docusate   Vital Signs    Vitals:   09/13/22 0200 09/13/22 0357 09/13/22 0500 09/13/22 0801  BP:   (!) 140/81 (!) 173/72  Pulse: (!) 108 (!) 110 (!) 120 (!) 126  Resp: (!) 25 (!) 23 20 20  $ Temp:   97.9 F (36.6 C) 98.2 F (36.8 C)  TempSrc:   Oral Oral  SpO2: 97% 100% 99% 100%  Weight:      Height:        Intake/Output Summary (Last 24 hours) at 09/13/2022 0846 Last data filed at 09/12/2022 2105 Gross per 24 hour  Intake 0 ml  Output 700 ml  Net -700 ml      09/11/2022    8:40 PM 09/05/2022    8:47 AM 04/30/2022    8:23 AM  Last 3 Weights  Weight (lbs) 227 lb 4.7 oz 230 lb 257 lb 8 oz  Weight (kg) 103.1 kg 104.327 kg 116.801 kg      Telemetry    Atrial fibrillation with increased ventricular rates, 110-130bpm - Personally Reviewed  ECG    No new tracing - Personally Reviewed  Physical Exam   GEN: No acute distress.   Neck: No JVD Cardiac: irregularly irregular and rapid, no murmurs, rubs, or gallops.  Respiratory:  No rales/crackles noted today. Patient does have some general inspiratory rhonchi bilaterally. GI: Soft, nontender, non-distended  MS: trace bilateral lower extremity edema Neuro:  Nonfocal  Psych: Normal affect   Labs    High Sensitivity Troponin:  No results for input(s): "TROPONINIHS" in the last 720 hours.   Chemistry Recent Labs  Lab 09/07/22 0335 09/08/22 0152 09/09/22 0746 09/10/22 0346 09/11/22 0123 09/12/22 0810  NA 138 137   < > 138 138 138  K 4.5 3.9   < > 3.8 4.3 4.2  CL 103 105   < > 105 103 103  CO2 22 20*   < > 23 25 24  $ GLUCOSE 91 95   < > 108* 103* 102*  BUN 26* 26*   < > 22 21 25*  CREATININE 1.75* 1.68*   < > 1.47* 1.42* 1.57*  CALCIUM 8.9 8.5*   < > 9.2 9.4 9.4  PROT 5.5* 5.3*  --   --   --   --   ALBUMIN 2.8* 2.5*  --   --   --   --   AST 17 14*  --   --   --   --   ALT 8  9  --   --   --   --   ALKPHOS 36* 29*  --   --   --   --   BILITOT 0.7 1.0  --   --   --   --   GFRNONAA 41* 43*   < > 50* 52* 46*  ANIONGAP 13 12   < > 10 10 11   $ < > = values in this interval not displayed.    Lipids  Recent Labs  Lab 09/13/22 0209  CHOL 177  TRIG 138  HDL 35*  LDLCALC 114*  CHOLHDL 5.1    Hematology Recent Labs  Lab 09/11/22 0123 09/11/22 1311 09/12/22 0810 09/13/22 0209  WBC 5.9  --  7.7 9.1  RBC 3.62*  --  3.79* 3.34*  HGB 10.1* 10.8* 10.7* 9.3*  HCT 31.9* 33.7* 34.7* 30.6*  MCV 88.1  --  91.6 91.6  MCH 27.9  --  28.2 27.8  MCHC 31.7  --  30.8 30.4  RDW 18.4*  --  18.7* 18.6*  PLT 150  --  166 PLATELET CLUMPS NOTED ON SMEAR, UNABLE TO ESTIMATE   Thyroid No results for input(s): "TSH", "FREET4" in the last 168 hours.  BNPNo results for input(s): "BNP", "PROBNP" in the last 168 hours.  DDimer No results for input(s): "DDIMER" in the last 168 hours.   Radiology    CT ABDOMEN PELVIS WO CONTRAST  Result Date: 09/13/2022 CLINICAL DATA:  Bowel obstruction suspected. X-ray yesterday showing sigmoid dilatation concerning for volvulus. EXAM: CT  ABDOMEN AND PELVIS WITHOUT CONTRAST TECHNIQUE: Multidetector CT imaging of the abdomen and pelvis was performed following the standard protocol without IV contrast. RADIATION DOSE REDUCTION: This exam was performed according to the departmental dose-optimization program which includes automated exposure control, adjustment of the mA and/or kV according to patient size and/or use of iterative reconstruction technique. COMPARISON:  Portable flat plate abdomen study yesterday at 5:49 p.m., and CT abdomen pelvis with oral contrast 07/31/2018. FINDINGS: Lower chest: There is mild cardiomegaly. No pericardial effusion or visible significant calcific CAD. There are small layering pleural effusions. There are small areas of ground-glass disease in the medial segment of the right middle lobe and in the peripheral lingula suggesting a viral pneumonitis or ground-glass edema. There is consolidation or atelectasis anterior to the effusions in the lower lobes. Hepatobiliary: Not well seen due to abundant breathing motion artifact, and streak artifact from the patient's arms in the field. Small scattered hepatic cysts are again faintly visible. There is no obvious mass. Gallbladder contains dense layering fluid proximally and is air distended distally, potentially indicating evidence of prior sphincterotomy. Other etiologies are possible. There is no wall thickening or biliary dilatation. The dense layering material could be dense sludge, gravel, or contrast and is not well characterized due to breathing motion. Pancreas: Partially atrophic, without contrast otherwise unremarkable. Spleen: Unremarkable without contrast. Adrenals/Urinary Tract: End-stage renal atrophy. The cortex replaced by numerous cysts varying in density. Largest on the left is 1.5 cm in the upper pole. Largest on the right is 1.7 cm in the upper pole. Due to breathing motion cyst density characterization unable to be performed. There is a transplant kidney in  the right iliac fossa. No focal abnormality of the unenhanced cortex is seen. No intrarenal stone is seen. There is mild hydroureteronephrosis without evidence of ureteral stone. There is no adrenal mass. No native urinary stone or obstruction. Unremarkable bladder for the degree of distention. Stomach/Bowel: The stomach is contracted.  There are thickened folds and mild stranding involving the descending duodenum consistent with duodenitis. The remainder of unopacified small bowel is unremarkable. The appendix is normal. There is mild gas distention without dilatation in the transverse colon. The ascending and descending colon largely decompressed. There is advanced diverticulosis in the distal descending and proximal sigmoid colon with increased inflammatory stranding over portions consistent with acute diverticulitis. At about the junction of the mid and distal thirds of the sigmoid segment there is circumferential wall thickening left of the midline on 3:48 and an abrupt caliber change to dilatation in the distal third of the sigmoid which is dilated up to 14 cm. The mid sigmoid leading up to this is tortuous but does not appear twisted such as with volvulus. The wall thickening at the point of transition is concerning for infiltrating neoplasm. Distal this there is mild-to-moderate retained stool in the rectum including a 4.8 cm densely rim calcified stool ball, and there is mild thickening of the rectum without pneumatosis. Vascular/Lymphatic: Aortic atherosclerosis. No enlarged abdominal or pelvic lymph nodes. Reproductive: Prior prostatectomy. Other: Minimal ascites posterior deep pelvis. No free air, abscess or free bleed. The there are no incarcerated hernias. Musculoskeletal: Bridging syndesmophytes thoracic spine. Osteopenia and degenerative change lumbar spine. Acquired spinal stenosis L4-5. IMPRESSION: 1. Findings concerning for a mid/distal sigmoid colonic neoplasm with abrupt caliber change to  dilatation in the distal third of the sigmoid which is dilated up to 14 cm. The mid sigmoid leading up to this is tortuous but does not appear twisted such as with volvulus. There is mild thickening of the rectum and mild-to-moderate retained stool in the rectum. 2. Descending and proximal sigmoid advanced diverticulosis with evidence of acute diverticulitis over portions. 3. No free air, abscess or free bleed. 4. Small pleural effusions with ground-glass disease in the right middle lobe and lingula which could be due to ground-glass edema or viral pneumonitis, atelectasis or pneumonia in the lower lobes about the effusions. 5. Cardiomegaly. 6. End-stage native renal atrophy with transplant kidney in the right iliac fossa. There is mild hydroureteronephrosis of the transplant kidney without evidence of ureteral stone. The transplant is new from 2020. 7. Aortic atherosclerosis. 8. Small hepatic cysts. 9. Air distended gallbladder with dense layering material, could be dense sludge, gravel, or contrast. Etiology of the air in the gallbladder unknown but there is no wall thickening or biliary dilatation. Has the patient had a prior sphincterotomy? 10. Prior prostatectomy. 11. Osteopenia and degenerative change. Acquired spinal stenosis L4-5. Thoracic spine bridging syndesmophytes. Aortic Atherosclerosis (ICD10-I70.0). Electronically Signed   By: Telford Nab M.D.   On: 09/13/2022 01:27   VAS [place] LOWER EXTREMITY VENOUS (DVT)  Result Date: 09/12/2022  Lower Venous DVT Study Patient Name:  IRAH BONENFANT  Date of Exam:   09/12/2022 Medical Rec #: [id number]         Accession #:    [id number] Date of Birth: [birth year]         Patient Gender: M Patient Age:   72 years Exam Location:  [facility] Procedure:      VAS [place] LOWER EXTREMITY VENOUS (DVT) Referring Phys: NISCHAL NARENDRA --------------------------------------------------------------------------------  Indications: Edema RT>LT, Covid-19, shortness of  breath.  Comparison Study: No prior studies. Performing Technologist: Darlin Coco RDMS, RVT  Examination Guidelines: A complete evaluation includes B-mode imaging, spectral Doppler, color Doppler, and power Doppler as needed of all accessible portions of each vessel. Bilateral testing is considered an integral part of a complete examination.  Limited examinations for reoccurring indications may be performed as noted. The reflux portion of the exam is performed with the patient in reverse Trendelenburg.  +---------+---------------+---------+-----------+----------+--------------+ RIGHT    CompressibilityPhasicitySpontaneityPropertiesThrombus Aging +---------+---------------+---------+-----------+----------+--------------+ CFV      Partial        Yes      Yes                  Acute          +---------+---------------+---------+-----------+----------+--------------+ SFJ      Full                                                        +---------+---------------+---------+-----------+----------+--------------+ FV Prox  None           No       No                   Acute          +---------+---------------+---------+-----------+----------+--------------+ FV Mid   None           No       No                   Acute          +---------+---------------+---------+-----------+----------+--------------+ FV DistalNone           No       No                   Acute          +---------+---------------+---------+-----------+----------+--------------+ PFV      Full                                                        +---------+---------------+---------+-----------+----------+--------------+ POP      None           No       No                   Acute          +---------+---------------+---------+-----------+----------+--------------+ PTV      Full                                                         +---------+---------------+---------+-----------+----------+--------------+ PERO     Partial        Yes      Yes                  Acute          +---------+---------------+---------+-----------+----------+--------------+   +---------+---------------+---------+-----------+----------+--------------+ LEFT     CompressibilityPhasicitySpontaneityPropertiesThrombus Aging +---------+---------------+---------+-----------+----------+--------------+ CFV      Full           Yes      Yes                                 +---------+---------------+---------+-----------+----------+--------------+ SFJ  Full                                                        +---------+---------------+---------+-----------+----------+--------------+ FV Prox  Full                                                        +---------+---------------+---------+-----------+----------+--------------+ FV Mid   Full                                                        +---------+---------------+---------+-----------+----------+--------------+ FV DistalFull                                                        +---------+---------------+---------+-----------+----------+--------------+ PFV      Full                                                        +---------+---------------+---------+-----------+----------+--------------+ POP      Full           Yes      Yes                                 +---------+---------------+---------+-----------+----------+--------------+ PTV      Full                                                        +---------+---------------+---------+-----------+----------+--------------+ PERO     Full                                                        +---------+---------------+---------+-----------+----------+--------------+     Summary: RIGHT: - Findings consistent with acute deep vein thrombosis involving the right common femoral vein, right  femoral vein, right popliteal vein, and right peroneal veins. - No cystic structure found in the popliteal fossa. - Common femoral vein obstruction doesn't appear to extend above inguinal ligament.  LEFT: - There is no evidence of deep vein thrombosis in the lower extremity.  - No cystic structure found in the popliteal fossa.  *See table(s) above for measurements and observations. Electronically signed by Jamelle Haring on 09/12/2022 at 10:03:53 PM.    Final    DG Abd Portable 1V  Result Date: 09/12/2022 CLINICAL DATA:  Encephalopathy EXAM: PORTABLE ABDOMEN - 1  VIEW COMPARISON:  CT 07/30/2018 FINDINGS: Diffuse air-filled colon identified. There is a dilated loop of presumed sigmoid colon extending into the midabdomen which has a diameter of up to 10.7 cm. Prior CT scan also demonstrated a tortuous dilated colon with diameter of up to 8.2 cm. More proximally the colon is air-filled but nondilated as is the small bowel. Please correlate for any clinical evidence of developing volvulus or obstruction. No obvious free air on these portable supine radiographs. There is a rounded structure in the central pelvis which has presumable rim calcification. This was not seen on the remote CT scan. A pelvic lesion is possible. Please correlate for any known history or dedicated workup. Critical Value/emergent results were called by telephone at the time of interpretation on 09/12/2022 at 3:08 pm to provider Dr. Dareen Piano , who verbally acknowledged these results. IMPRESSION: Severely dilated loop of presumably sigmoid colon in the midabdomen extending out of the pelvis. More proximally the large bowel is air-filled and distended but not dilated and there are air-filled loops of small bowel. Please correlate for any developing signs of distal colonic obstruction or volvulus. There is a rim calcified rounded structure in the central pelvis measuring 5.1 cm in diameter. This was not seen on the CT scan of 2020. Etiology is  uncertain. Please correlate for a calcified pelvic lesion, foreign body or other process. With these 2 findings, a CT scan may be of some benefit as clinically appropriate. IV contrast would be preferable if possible Electronically Signed   By: Jill Side M.D.   On: 09/12/2022 18:33   MR BRAIN WO CONTRAST  Result Date: 09/12/2022 CLINICAL DATA:  acute encephalopathy, afib with AC held, EXAM: MRI HEAD WITHOUT CONTRAST TECHNIQUE: Multiplanar, multiecho pulse sequences of the brain and surrounding structures were obtained without intravenous contrast. COMPARISON:  CT head from the same day. FINDINGS: Brain: No acute infarction, hemorrhage, hydrocephalus, extra-axial collection or mass lesion. Patchy T2/FLAIR hyperintensities with the white matter, nonspecific but compatible with chronic microvascular ischemic disease that is mild for patient age. Vascular: Major arterial flow voids are maintained at the skull base. Skull and upper cervical spine: Normal marrow signal. Sinuses/Orbits: Clear sinuses.  No acute orbital findings. Other: No mastoid effusions. IMPRESSION: No evidence of acute intracranial abnormality. Electronically Signed   By: Margaretha Sheffield M.D.   On: 09/12/2022 17:12   CT HEAD WO CONTRAST (5MM)  Result Date: 09/12/2022 CLINICAL DATA:  Mental status change, unknown cause EXAM: CT HEAD WITHOUT CONTRAST TECHNIQUE: Contiguous axial images were obtained from the base of the skull through the vertex without intravenous contrast. RADIATION DOSE REDUCTION: This exam was performed according to the departmental dose-optimization program which includes automated exposure control, adjustment of the mA and/or kV according to patient size and/or use of iterative reconstruction technique. COMPARISON:  None Available. FINDINGS: Brain: No evidence of acute infarction, hemorrhage, hydrocephalus, extra-axial collection or mass lesion/mass effect. Scattered low-density changes within the periventricular and  subcortical white matter most compatible with chronic microvascular ischemic change. Vascular: No hyperdense vessel or unexpected calcification. Skull: Normal. Negative for fracture or focal lesion. Sinuses/Orbits: No acute finding. Other: None. IMPRESSION: 1. No acute intracranial abnormality. 2. Chronic microvascular ischemic change of the white matter. Electronically Signed   By: Davina Poke D.O.   On: 09/12/2022 09:35   DG CHEST PORT 1 VIEW  Result Date: 09/11/2022 CLINICAL DATA:  Shortness of breath.  COVID infection. EXAM: PORTABLE CHEST 1 VIEW COMPARISON:  08/29/2022 FINDINGS: The heart is within  normal limits in size given the AP projection and portable technique. Prominent mediastinal contours somewhat accentuated by low lung volumes and the AP projection. There is central vascular congestion, peribronchial thickening and abnormal perihilar aeration along with new bilateral patchy pulmonary infiltrates no definite pleural effusions. IMPRESSION: 1. New bilateral patchy pulmonary infiltrates. 2. Central vascular congestion, peribronchial thickening and abnormal perihilar aeration. Electronically Signed   By: Marijo Sanes M.D.   On: 09/11/2022 17:32    Cardiac Studies   09/05/22 TTE   IMPRESSIONS     1. Left ventricular ejection fraction, by estimation, is 60 to 65%. The  left ventricle has normal function. The left ventricle has no regional  wall motion abnormalities. There is mild left ventricular hypertrophy of  the basal-septal segment. Left  ventricular diastolic function could not be evaluated.   2. Right ventricular systolic function is normal. The right ventricular  size is normal.   3. Left atrial size was mildly dilated.   4. The mitral valve is normal in structure. Trivial mitral valve  regurgitation. No evidence of mitral stenosis.   5. The aortic valve is tricuspid. Aortic valve regurgitation is not  visualized. Aortic valve sclerosis is present, with no evidence of  aortic  valve stenosis.   FINDINGS   Left Ventricle: Left ventricular ejection fraction, by estimation, is 60  to 65%. The left ventricle has normal function. The left ventricle has no  regional wall motion abnormalities. The left ventricular internal cavity  size was normal in size. There is   mild left ventricular hypertrophy of the basal-septal segment. Left  ventricular diastolic function could not be evaluated due to atrial  fibrillation. Left ventricular diastolic function could not be evaluated.   Right Ventricle: The right ventricular size is normal. No increase in  right ventricular wall thickness. Right ventricular systolic function is  normal.   Left Atrium: Left atrial size was mildly dilated.   Right Atrium: Right atrial size was normal in size.   Pericardium: There is no evidence of pericardial effusion.   Mitral Valve: The mitral valve is normal in structure. Trivial mitral  valve regurgitation. No evidence of mitral valve stenosis.   Tricuspid Valve: The tricuspid valve is normal in structure. Tricuspid  valve regurgitation is trivial. No evidence of tricuspid stenosis.   Aortic Valve: The aortic valve is tricuspid. Aortic valve regurgitation is  not visualized. Aortic valve sclerosis is present, with no evidence of  aortic valve stenosis.   Pulmonic Valve: The pulmonic valve was normal in structure. Pulmonic valve  regurgitation is not visualized. No evidence of pulmonic stenosis.   Aorta: The aortic root is normal in size and structure.   Venous: The inferior vena cava was not well visualized.   IAS/Shunts: No atrial level shunt detected by color flow Doppler.   Patient Profile   72 y.o. male  with ESRD s/p subsequent kidney transplant 01/2019 [facility] (subsequent CKD stage IV with Cr 2.71 in 07/2022) on chronic immunosuppressants, prostate CA s/p prostatectomy 2016, HTN, DM, prior anemia on labs who presented to the hospital with URI symptoms x2  weeks, initially improved then onset of diarrhea, recurrent cough, and weakness about 1 week ago. Was orthostatic this admission, also in new onset Afib of unclear chronicity.      Assessment & Plan    New atrial fibrillation    Patient found with atrial fibrillation this admission, unknown duration. Possibly brought on by recent acute viral illness. On admission, patient was taking  Coreg. This was changed to Metoprolol for improved rate control.   Anticoagulation has been complicated by hematochezia. Eliquis held. 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 HR appears fluctuant on telemetry, increased today. Will trial higher dose metoprolol tartrate, 73m BID today.   Hypertension  Patient was taking coreg 229mBID at home prior to admission. This was changed to Metoprolol for better rate control in the setting of new afib. Unfortunately patient is now trending hypertensive. Of note, BP is being checked on patient's leg due to limb restrictions, unclear if fully accurate.   If BP remains elevated today despite higher dose metoprolol, would need to consider adding anti-hypertensive agent.   Acute hypoxic respiratory failure   Patient with acute onset of dyspnea 2/20-2/21, necessitating small amount of O2 by nasal cannula. Received IV lasix and now has improved breath sounds. Etiology infection vs CHF. Net positive 5L earlier this admission, now down to ~2L with diuresis. Would caution against aggressove IV fluid administration. Appears relatively euvolemic today, would not give additional lasix today given elevated creatinine.   Acute neurological deficits   Patient with what appeared to be new drooping of mouth yesterday. This was concerning in the setting of decreased responsiveness as well as new acute DVT in right common femoral vein, popliteal vein, right peroneal vein.    MRI brain without acute process. 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  Patient examined.  I agree with the findings as above.  The patient exam reveals [id number] ,  Lungs: Clear  ,  Abd: Positive bowel sounds, no rebound no guarding, Ext No edema  .  All available labs, radiology testing, previous records reviewed. Agree with documented assessment and plan. Atrial fib with RVR:  Only received two doses of beta blocker each of the last two days with his AMS and some low HR.  Now should be able to tolerate a slightly higher dose for rate control.  However, rate control will be imperfect with his ongoing comorbid illnesses.    Colonic mass:  Awaiting further discussion about next steps.   Off of anticoagulation.  No absolute cardiac contraindication for GI procedures as needed.     JaJeneen Rinksochrein  12:04 PM  09/13/2022

## 2022-09-13 NOTE — Progress Notes (Signed)
PT Cancellation Note  Patient Details Name: Andrew Brandt MRN: [id number] DOB: [birth year]   Cancelled Treatment:    Reason Eval/Treat Not Completed: (P) Medical issues which prohibited therapy (pt pending IVC filter placement today, currently with new RLE DVT and not on AC due to hx GIB. Will defer PT tx until after IVC filter placed for pt safety.) Will continue efforts per PT plan of care once medically appropriate as schedule permits.   [name] M Marv Alfrey 09/13/2022, 3:02 PM

## 2022-09-13 NOTE — Evaluation (Addendum)
Clinical/Bedside Swallow Evaluation Patient Details  Name: Andrew Brandt MRN: [id number] Date of Birth: [birth year]  Today's Date: 09/13/2022 Time: SLP Start Time (ACUTE ONLY): [id number] SLP Stop Time (ACUTE ONLY): 0933 SLP Time Calculation (min) (ACUTE ONLY): 10 min  Past Medical History:  Past Medical History:  Diagnosis Date   Cancer ([place]) 2017   prostate cancer   Chronic kidney disease    Dialysis Tues-Thurs.[facility] [place]   Constipation    Diabetes mellitus ([place])    Headache    occ   History of kidney transplant    Hypertension    Neck pain    pulled muscle in neck on cyclobenzaprine prn   Past Surgical History:  Past Surgical History:  Procedure Laterality Date   AV FISTULA PLACEMENT Left 11/23/2013   Procedure: ARTERIOVENOUS (AV) FISTULA CREATION VS. GRAFT;  Surgeon: Rosetta Posner, MD;  Location: [place];  Service: Vascular;  Laterality: Left;   AV FISTULA PLACEMENT Left 07/12/2017   Procedure: INSERTION OF ARTERIOVENOUS (AV) GORE-TEX GRAFT ARM LEFT UPPER ARM;  Surgeon: Elam Dutch, MD;  Location: [facility] OR;  Service: Vascular;  Laterality: Left;   AV FISTULA PLACEMENT Right 10/21/2017   Procedure: ARTERIOVENOUS ARM (AV) FISTULA CREATION;  Surgeon: Elam Dutch, MD;  Location: [place];  Service: Vascular;  Laterality: Right;   COLONOSCOPY WITH PROPOFOL N/A 03/25/2015   Procedure: COLONOSCOPY WITH PROPOFOL;  Surgeon: Carol Ada, MD;  Location: [facility];  Service: Endoscopy;  Laterality: N/A;   COLONOSCOPY WITH PROPOFOL N/A 05/09/2018   Procedure: COLONOSCOPY WITH PROPOFOL;  Surgeon: Carol Ada, MD;  Location: [facility];  Service: Endoscopy;  Laterality: N/A;  aborted due to prep   INSERTION OF DIALYSIS CATHETER N/A 11/23/2013   Procedure: INSERTION OF DIALYSIS CATHETER RIGHT INTERNAL JUGULAR VEIN;  Surgeon: Rosetta Posner, MD;  Location[facility];  Service: Vascular;  Laterality: N/A;   PROSTATECTOMY     THROMBECTOMY AND REVISION OF ARTERIOVENTOUS (AV)  GORETEX  GRAFT Left 09/02/2017   Procedure: THROMBECTOMY  OF Left Arm  ARTERIOVENTOUS (AV) GORETEX  GRAFT;  Surgeon: Angelia Mould, MD;  Location: [facility] OR;  Service: Vascular;  Laterality: Left;   HPI:  Andrew Brandt is a 72 year old who presents via EMS for generalized weakness, diarrhea and poor appetite; with a history of ESRD s/p renal transplant on immunosuppression, prostate cancer s/p prostatectomy, T2DM and hypertension. Found to have Covid 19 infection. On 2/21 code stroke was activated for left sided weakness and facial droop. MRI no evidence of acute intracranial abnormality.    Assessment / Plan / Recommendation  Clinical Impression  Pt demonstrated a primarily oral dysphagia marked by prolonged mastication and mild lingual residue with solid. He has no maxillary dentition and 3-4 teeth mandibular without dentures present. He followed commands with delayed processing for oromotor exam, strong cough. Initial sip thin via straw resulted in immediate cough and no other s/s aspiration present with remainder of evaluation. Slower manipulation and muching pattern noted with puree. Eructation noted x 1. Pt is currently on clear liquids and therapist will upgrade to Dys 1 (puree), continue thin and crush pills. ST will continue to follow for diet texture upgrade when appropriate. SLP Visit Diagnosis: Dysphagia, unspecified (R13.10)    Aspiration Risk  Mild aspiration risk    Diet Recommendation Dysphagia 1 (Puree);Thin liquid   Liquid Administration via: Straw;Cup Medication Administration: Crushed with puree Supervision: Staff to assist with self feeding;Full supervision/cueing for compensatory strategies Compensations: Minimize environmental distractions;Slow rate;Small  sips/bites Postural Changes: Seated upright at 90 degrees    Other  Recommendations Oral Care Recommendations: Oral care BID    Recommendations for follow up therapy are one component of a multi-disciplinary  discharge planning process, led by the attending physician.  Recommendations may be updated based on patient status, additional functional criteria and insurance authorization.  Follow up Recommendations Skilled nursing-short term rehab (<3 hours/day)      Assistance Recommended at Discharge    Functional Status Assessment Patient has had a recent decline in their functional status and demonstrates the ability to make significant improvements in function in a reasonable and predictable amount of time.  Frequency and Duration min 2x/week  2 weeks       Prognosis Prognosis for improved oropharyngeal function: Good Barriers to Reach Goals: Cognitive deficits      Swallow Study   General HPI: Andrew Brandt is a 72 year old who presents via EMS for generalized weakness, diarrhea and poor appetite; with a history of ESRD s/p renal transplant on immunosuppression, prostate cancer s/p prostatectomy, T2DM and hypertension. Found to have Covid 19 infection. On 2/21 code stroke was activated for left sided weakness and facial droop. MRI no evidence of acute intracranial abnormality. Type of Study: Bedside Swallow Evaluation Previous Swallow Assessment:  (none) Diet Prior to this Study: Thin liquids (Level 0);Clear liquid diet Temperature Spikes Noted: No Respiratory Status: Nasal cannula History of Recent Intubation: No Behavior/Cognition: Alert;Cooperative;Pleasant mood;Requires cueing Oral Cavity Assessment: Dry Oral Care Completed by SLP: Yes Oral Cavity - Dentition: Poor condition;Missing dentition (no maxillary dentition, 3-4 mandibular teeth) Vision: Functional for self-feeding Self-Feeding Abilities: Needs assist Patient Positioning: Upright in bed Baseline Vocal Quality: Normal Volitional Cough: Strong Volitional Swallow: Able to elicit    Oral/Motor/Sensory Function Overall Oral Motor/Sensory Function: Generalized oral weakness   Ice Chips Ice chips: Not tested   Thin Liquid Thin  Liquid: Impaired Presentation: Straw Pharyngeal  Phase Impairments: Cough - Immediate    Nectar Thick Nectar Thick Liquid: Not tested   Honey Thick Honey Thick Liquid: Not tested   Puree Puree: Within functional limits   Solid     Solid: Impaired Oral Phase Impairments: Reduced lingual movement/coordination Oral Phase Functional Implications: Prolonged oral transit;Oral residue Pharyngeal Phase Impairments:  (none)      Darlisa Spruiell, Orbie Pyo 09/13/2022,9:45 AM

## 2022-09-13 NOTE — Progress Notes (Signed)
OT Cancellation Note  Patient Details Name: JOELLE BRANCO MRN: [id number] DOB: 10/20/1950   Cancelled Treatment:    Reason Eval/Treat Not Completed: Medical issues which prohibited therapy.  Possible IVC filter placement.    [name] D Kimarion Chery 09/13/2022, 12:44 PM 09/13/2022  RP, OTR/L  Acute Rehabilitation Services  Office:  [phone]

## 2022-09-13 NOTE — TOC Initial Note (Signed)
Transition of Care [facility]) - Initial/Assessment Note    Patient Details  Name: JHARED BIZON MRN: [id number] Date of Birth: December 30, 1950  [facility]) CM/SW Contact:    Vinie Sill, LCSW Phone Number: 09/13/2022, 12:57 PM  Clinical Narrative:                   Expected Discharge Plan: [place] Barriers to Discharge: Continued Medical Work up   Patient Goals and CMS Choice Patient states their goals for this hospitalization and ongoing recovery are:: To return home CMS Medicare.gov Compare Post Acute Care list provided to:: Patient Choice offered to / list presented to : Patient, Spouse      Expected Discharge Plan and Services   Discharge Planning Services: CM Consult Post Acute Care Choice: [place] arrangements for the past 2 months: Single Family Home                 DME Arranged: Bedside commode, Walker rolling DME Agency: [employer] Date DME Agency Contacted: 09/06/22 Time DME Agency Contacted: 1110 Representative spoke with at DME Agency: Erasmo Downer HH Arranged: PT, OT HH Agency: [place] Date [place]: 09/06/22 Time [place]: 1100 Representative spoke with at [place]: Claiborne Billings  Prior Living Arrangements/Services Living arrangements for the past 2 months: [place] with:: Significant Other Patient language and need for interpreter reviewed:: Yes Do you feel safe going back to the place where you live?: Yes      Need for Family Participation in Patient Care: Yes (Comment) Care giver support system in place?: Yes (comment) Current home services: DME (Cane, shower seat) Criminal Activity/Legal Involvement Pertinent to Current Situation/Hospitalization: No - Comment as needed  Activities of Daily Living      Permission Sought/Granted Permission sought to share information with : Case Manager, Customer service manager, Family Supports Permission granted to  share information with : Yes, Verbal Permission Granted              Emotional Assessment Appearance:: Appears stated age Attitude/Demeanor/Rapport: Engaged, Gracious Affect (typically observed): Accepting, Appropriate, Calm, Hopeful, Pleasant Orientation: : Oriented to Self, Oriented to Place, Oriented to  Time, Oriented to Situation Alcohol / Substance Use: Not Applicable Psych Involvement: No (comment)  Admission diagnosis:  Dehydration [E86.0] Weakness [R53.1] COVID [U07.1] COVID-19 virus infection [U07.1] Patient Active Problem List   Diagnosis Date Noted   COVID-19 virus infection 09/16/2022   End stage renal disease ([place]) 01/05/2014   Thrombocytopenia, unspecified ([place]) 11/25/2013   ESRD (end stage renal disease) ([place]) 11/20/2013   Normocytic anemia 11/20/2013   AKI (acute kidney injury) ([place]) 11/19/2013   HTN (hypertension) 11/19/2013   PCP:  Benito Mccreedy, MD Pharmacy:   [id number] [place], [id number]: [phone]: [phone] [facility] [address]: [phone]: [phone]    Social Determinants of Health (SDOH) Social History: SDOH Screenings   Tobacco Use: Medium Risk (09/05/2022)   SDOH Interventions: Transportation Interventions: Intervention Not Indicated, Inpatient TOC, Patient Resources (Friends/Family)   Readmission Risk Interventions     No data to display

## 2022-09-13 NOTE — NC FL2 (Signed)
[place] LEVEL OF CARE FORM     IDENTIFICATION  Patient Name: Andrew Brandt Birthdate: 08-Mar-1951 Sex: male Admission Date (Current Location): 09/03/2022  [facility] and [place] Number:  Herbalist and Address:  [facility] [place], [place] [address] [place], [place] [address]      Provider Number: [id number]  Attending Physician Name and Address:  Aldine Contes, MD  Relative Name and Phone Number:       Current Level of Care: Hospital Recommended Level of Care: [place] Prior Approval Number:    Date Approved/Denied:   PASRR Number: [id number]  Discharge Plan: SNF    Current Diagnoses: Patient Active Problem List   Diagnosis Date Noted   COVID-19 virus infection 09/01/2022   End stage renal disease ([place]) 01/05/2014   Thrombocytopenia, unspecified ([place]) 11/25/2013   ESRD (end stage renal disease) ([place]) 11/20/2013   Normocytic anemia 11/20/2013   AKI (acute kidney injury) ([place]) 11/19/2013   HTN (hypertension) 11/19/2013    Orientation RESPIRATION BLADDER Height & Weight     Time, Situation, Place  Normal Incontinent, External catheter Weight: 227 lb 4.7 oz (103.1 kg) Height:  5' 9"$  (175.3 cm)  BEHAVIORAL SYMPTOMS/MOOD NEUROLOGICAL BOWEL NUTRITION STATUS      Incontinent Diet (please see discharge summary)  AMBULATORY STATUS COMMUNICATION OF NEEDS Skin   Limited Assist Verbally Normal                       Personal Care Assistance Level of Assistance  Bathing, Feeding, Dressing Bathing Assistance: Maximum assistance Feeding assistance: Limited assistance Dressing Assistance: Limited assistance     Functional Limitations Info  Sight, Hearing, Speech Sight Info: Adequate Hearing Info: Adequate Speech Info: Adequate    SPECIAL CARE FACTORS FREQUENCY  PT (By licensed PT), OT (By licensed OT)     PT Frequency: 5x per week OT Frequency: 5x per week            Contractures Contractures  Info: Not present    Additional Factors Info  Code Status, Allergies Code Status Info: FULL Allergies Info: NKA           Current Medications (09/13/2022):  This is the current hospital active medication list Current Facility-Administered Medications  Medication Dose Route Frequency Provider Last Rate Last Admin   acetaminophen (TYLENOL) tablet 650 mg  650 mg Oral Q6H PRN Lacinda Axon, MD   650 mg at 09/09/22 2256   Or   acetaminophen (TYLENOL) suppository 650 mg  650 mg Rectal Q6H PRN Lacinda Axon, MD       benzonatate (TESSALON) capsule 100 mg  100 mg Oral TID PRN Angelique Blonder, DO   100 mg at 09/09/22 2255   ceFEPIme (MAXIPIME) 2 g in sodium chloride 0.9 % 100 mL IVPB  2 g Intravenous Q12H Franky Macho, RPH 200 mL/hr at 09/13/22 0953 2 g at 09/13/22 [id number]   Chlorhexidine Gluconate Cloth 2 % PADS 6 each  6 each Topical Daily Aldine Contes, MD   6 each at 09/13/22 0815   feeding supplement (ENSURE ENLIVE / ENSURE PLUS) liquid 237 mL  237 mL Oral BID BM Lacinda Axon, MD   237 mL at 09/10/22 1113   fentaNYL (SUBLIMAZE) 100 MCG/2ML injection            guaiFENesin (MUCINEX) 12 hr tablet 600 mg  600 mg Oral BID Lacinda Axon, MD   600 mg at 09/13/22  1317   levalbuterol (XOPENEX) nebulizer solution 0.63 mg  0.63 mg Nebulization Q4H PRN Aldine Contes, MD   0.63 mg at 09/11/22 1627   metoprolol tartrate (LOPRESSOR) tablet 75 mg  75 mg Oral BID Lily Kocher, PA-C   75 mg at 09/13/22 [id number]   metroNIDAZOLE (FLAGYL) IVPB 500 mg  500 mg Intravenous Q8H Virl Axe, MD 100 mL/hr at 09/13/22 1319 500 mg at 09/13/22 1319   midazolam (VERSED) 2 MG/2ML injection            mycophenolate (MYFORTIC) EC tablet 180 mg  180 mg Oral BID Virl Axe, MD   180 mg at 09/13/22 1317   ondansetron (ZOFRAN) tablet 4 mg  4 mg Oral Q6H PRN Lacinda Axon, MD       Or   ondansetron [facility]) injection 4 mg  4 mg Intravenous Q6H PRN Lacinda Axon, MD       Oral care  mouth rinse  15 mL Mouth Rinse PRN Aldine Contes, MD       predniSONE (DELTASONE) tablet 5 mg  5 mg Oral Q breakfast Virl Axe, MD   5 mg at 09/13/22 1318   senna-docusate (Senokot-S) tablet 1 tablet  1 tablet Oral QHS PRN Lacinda Axon, MD       sulfamethoxazole-trimethoprim (BACTRIM) 400-80 MG per tablet 1 tablet  1 tablet Oral Q M,W,F Virl Axe, MD   1 tablet at 09/10/22 1112   vancomycin (VANCOCIN) IVPB 1000 mg/200 mL premix  1,000 mg Intravenous Q24H Franky Macho, RPH 200 mL/hr at 09/13/22 0114 1,000 mg at 09/13/22 0114     Discharge Medications: Please see discharge summary for a list of discharge medications.  Relevant Imaging Results:  Relevant Lab Results:   Additional Information SSN [id number]  Vinie Sill, LCSW

## 2022-09-13 NOTE — Progress Notes (Signed)
Called Cardiology on-call fellow concerning persistent afib rvr with rate sustaining in the 120s. Patient is asleep and received a 1 x order of 41m of metoprolol IV from on-call IMTS physician. No new orders given, will continue to monitor.

## 2022-09-14 ENCOUNTER — Encounter (HOSPITAL_COMMUNITY): Admission: EM | Disposition: E | Payer: Self-pay | Source: Home / Self Care | Attending: Internal Medicine

## 2022-09-14 DIAGNOSIS — R531 Weakness: Secondary | ICD-10-CM | POA: Diagnosis not present

## 2022-09-14 DIAGNOSIS — D696 Thrombocytopenia, unspecified: Secondary | ICD-10-CM

## 2022-09-14 DIAGNOSIS — U071 COVID-19: Secondary | ICD-10-CM | POA: Diagnosis not present

## 2022-09-14 DIAGNOSIS — G9340 Encephalopathy, unspecified: Secondary | ICD-10-CM | POA: Diagnosis not present

## 2022-09-14 DIAGNOSIS — D490 Neoplasm of unspecified behavior of digestive system: Secondary | ICD-10-CM

## 2022-09-14 DIAGNOSIS — Z515 Encounter for palliative care: Secondary | ICD-10-CM | POA: Diagnosis not present

## 2022-09-14 DIAGNOSIS — E860 Dehydration: Secondary | ICD-10-CM | POA: Diagnosis not present

## 2022-09-14 LAB — CBC WITH DIFFERENTIAL/PLATELET
Abs Immature Granulocytes: 0.14 10*3/uL — ABNORMAL HIGH (ref 0.00–0.07)
Basophils Absolute: 0 10*3/uL (ref 0.0–0.1)
Basophils Relative: 0 %
Eosinophils Absolute: 0 10*3/uL (ref 0.0–0.5)
Eosinophils Relative: 1 %
HCT: 28.8 % — ABNORMAL LOW (ref 39.0–52.0)
Hemoglobin: 9 g/dL — ABNORMAL LOW (ref 13.0–17.0)
Immature Granulocytes: 2 %
Lymphocytes Relative: 24 %
Lymphs Abs: 1.9 10*3/uL (ref 0.7–4.0)
MCH: 28 pg (ref 26.0–34.0)
MCHC: 31.3 g/dL (ref 30.0–36.0)
MCV: 89.4 fL (ref 80.0–100.0)
Monocytes Absolute: 0.5 10*3/uL (ref 0.1–1.0)
Monocytes Relative: 6 %
Neutro Abs: 5.4 10*3/uL (ref 1.7–7.7)
Neutrophils Relative %: 67 %
Platelets: 99 10*3/uL — ABNORMAL LOW (ref 150–400)
RBC: 3.22 MIL/uL — ABNORMAL LOW (ref 4.22–5.81)
RDW: 18.3 % — ABNORMAL HIGH (ref 11.5–15.5)
WBC: 8 10*3/uL (ref 4.0–10.5)
nRBC: 0.5 % — ABNORMAL HIGH (ref 0.0–0.2)

## 2022-09-14 LAB — HEPATIC FUNCTION PANEL
ALT: 9 U/L (ref 0–44)
AST: 20 U/L (ref 15–41)
Albumin: 2.3 g/dL — ABNORMAL LOW (ref 3.5–5.0)
Alkaline Phosphatase: 41 U/L (ref 38–126)
Bilirubin, Direct: 0.3 mg/dL — ABNORMAL HIGH (ref 0.0–0.2)
Indirect Bilirubin: 0.8 mg/dL (ref 0.3–0.9)
Total Bilirubin: 1.1 mg/dL (ref 0.3–1.2)
Total Protein: 5.7 g/dL — ABNORMAL LOW (ref 6.5–8.1)

## 2022-09-14 LAB — MRSA NEXT GEN BY PCR, NASAL: MRSA by PCR Next Gen: NOT DETECTED

## 2022-09-14 LAB — CBC
HCT: 29.5 % — ABNORMAL LOW (ref 39.0–52.0)
Hemoglobin: 8.8 g/dL — ABNORMAL LOW (ref 13.0–17.0)
MCH: 27.5 pg (ref 26.0–34.0)
MCHC: 29.8 g/dL — ABNORMAL LOW (ref 30.0–36.0)
MCV: 92.2 fL (ref 80.0–100.0)
Platelets: 104 10*3/uL — ABNORMAL LOW (ref 150–400)
RBC: 3.2 MIL/uL — ABNORMAL LOW (ref 4.22–5.81)
RDW: 18.5 % — ABNORMAL HIGH (ref 11.5–15.5)
WBC: 7.8 10*3/uL (ref 4.0–10.5)
nRBC: 0.5 % — ABNORMAL HIGH (ref 0.0–0.2)

## 2022-09-14 LAB — GLUCOSE, CAPILLARY
Glucose-Capillary: 110 mg/dL — ABNORMAL HIGH (ref 70–99)
Glucose-Capillary: 111 mg/dL — ABNORMAL HIGH (ref 70–99)
Glucose-Capillary: 128 mg/dL — ABNORMAL HIGH (ref 70–99)
Glucose-Capillary: 129 mg/dL — ABNORMAL HIGH (ref 70–99)
Glucose-Capillary: 87 mg/dL (ref 70–99)
Glucose-Capillary: 97 mg/dL (ref 70–99)

## 2022-09-14 LAB — RENAL FUNCTION PANEL
Albumin: 2 g/dL — ABNORMAL LOW (ref 3.5–5.0)
Anion gap: 10 (ref 5–15)
BUN: 29 mg/dL — ABNORMAL HIGH (ref 8–23)
CO2: 22 mmol/L (ref 22–32)
Calcium: 8.6 mg/dL — ABNORMAL LOW (ref 8.9–10.3)
Chloride: 107 mmol/L (ref 98–111)
Creatinine, Ser: 1.83 mg/dL — ABNORMAL HIGH (ref 0.61–1.24)
GFR, Estimated: 38 mL/min — ABNORMAL LOW (ref >60)
Glucose, Bld: 118 mg/dL — ABNORMAL HIGH (ref 70–99)
Phosphorus: 3 mg/dL (ref 2.5–4.6)
Potassium: 3.6 mmol/L (ref 3.5–5.1)
Sodium: 139 mmol/L (ref 135–145)

## 2022-09-14 LAB — DIC (DISSEMINATED INTRAVASCULAR COAGULATION)PANEL
D-Dimer, Quant: 3.88 ug/mL-FEU — ABNORMAL HIGH (ref 0.00–0.50)
Fibrinogen: 611 mg/dL — ABNORMAL HIGH (ref 210–475)
INR: 1.4 — ABNORMAL HIGH (ref 0.8–1.2)
Platelets: 118 10*3/uL — ABNORMAL LOW (ref 150–400)
Prothrombin Time: 17 seconds — ABNORMAL HIGH (ref 11.4–15.2)
aPTT: 39 seconds — ABNORMAL HIGH (ref 24–36)

## 2022-09-14 LAB — LACTATE DEHYDROGENASE: LDH: 214 U/L — ABNORMAL HIGH (ref 98–192)

## 2022-09-14 SURGERY — COLONOSCOPY WITH PROPOFOL
Anesthesia: Monitor Anesthesia Care

## 2022-09-14 NOTE — Progress Notes (Signed)
Speech Language Pathology Treatment: Dysphagia  Patient Details Name: Andrew Brandt MRN: [id number] DOB: 20-May-1951 Today's Date: 09/06/2022 Time: [id number] SLP Time Calculation (min) (ACUTE ONLY): 13 min  Assessment / Plan / Recommendation Clinical Impression  Pt seen for dysphagia therapy with girlfriend at bedside who reports pt has been turning his head away from the puree food. After repositioning trials of higher textures given today however he continues to demonstrate cognitive based dysphagia suspected from effects of Covid. He did not respond verbally, slow to process information and not readily following commands. There was minimal mandibular movement during attempts to masticate solid graham cracker with periods of holding in oral cavity. Chewing was slow and extended period of time and verbal cueing needed to pt to continue attempts at chewing. Eventually he was able to clear oral cavity after small piece of cracker and liquid wash. He had several delayed coughs following straw sips water likely due to increased overall delays noted today. Pt is not appropriate to advance texture and should remain on puree with girlfriend in agreement with plan. She stated she will try to have him eat as much as possible and give Ensure as well. Educated importance of upright position, limiting environmental distractions. Continue puree, thin and crush meds, small sips thin liquids. ST will continue to work with pt toward texture advancement when he is able.    HPI HPI: [name] is a 72 year old who presents via EMS for generalized weakness, diarrhea and poor appetite; with a history of ESRD s/p renal transplant on immunosuppression, prostate cancer s/p prostatectomy, T2DM and hypertension. Found to have Covid 19 infection. On 2/21 code stroke was activated for left sided weakness and facial droop. MRI no evidence of acute intracranial abnormality.      SLP Plan  Continue with current plan of care       Recommendations for follow up therapy are one component of a multi-disciplinary discharge planning process, led by the attending physician.  Recommendations may be updated based on patient status, additional functional criteria and insurance authorization.    Recommendations  Diet recommendations: Dysphagia 1 (puree);Thin liquid Liquids provided via: Straw Medication Administration: Crushed with puree Supervision: Staff to assist with self feeding;Full supervision/cueing for compensatory strategies Compensations: Minimize environmental distractions;Slow rate;Small sips/bites Postural Changes and/or Swallow Maneuvers: Seated upright 90 degrees                Oral Care Recommendations: Oral care BID Follow Up Recommendations:  (TBD) Assistance recommended at discharge: Frequent or constant Supervision/Assistance SLP Visit Diagnosis: Dysphagia, unspecified (R13.10) Plan: Continue with current plan of care           Houston Siren  09/10/2022, 11:53 AM

## 2022-09-14 NOTE — TOC Progression Note (Signed)
Discharge medications (2) are being stored in the main pharmacy on the ground floor until patient is ready for discharge.   

## 2022-09-14 NOTE — Progress Notes (Signed)
Physical Therapy Treatment Patient Details Name: Andrew Brandt MRN: [id number] DOB: [birth year] Today's Date: 08/31/2022   History of Present Illness Mr. Sharaf is a 72 year old who presents via EMS for generalized weakness, diarrhea and poor appetite; with a history of ESRD s/p renal transplant on immunosuppression, prostate cancer s/p prostatectomy, T2DM and hypertension    PT Comments    Mod-max assist for bed mobility today. SpO2 maintained upper 90s on 2L supplemental O2. HR in 110s. Sat EOB unsupported but unable to successfully stand today. Returned to bed, assisted with pericare due to bowel incontinence. Patient will continue to benefit from skilled physical therapy services to further improve independence with functional mobility.    Recommendations for follow up therapy are one component of a multi-disciplinary discharge planning process, led by the attending physician.  Recommendations may be updated based on patient status, additional functional criteria and insurance authorization.  Follow Up Recommendations  Skilled nursing-short term rehab (<3 hours/day) (Pending family decision - may go home with hospice care) Can patient physically be transported by private vehicle: No   Assistance Recommended at Discharge Set up Supervision/Assistance  Patient can return home with the following Help with stairs or ramp for entrance;Assistance with cooking/housework;A little help with walking and/or transfers;A little help with bathing/dressing/bathroom;Assist for transportation   Equipment Recommendations   (TBD pending d/c with hospice or not, likely need hospital bed and hoyer if going home.)    Recommendations for Other Services       Precautions / Restrictions Precautions Precautions: Fall Precaution Comments: orthostatic hypotension Restrictions Weight Bearing Restrictions: No     Mobility  Bed Mobility Overal bed mobility: Needs Assistance Bed Mobility: Supine to Sit,  Rolling, Sit to Sidelying Rolling: Mod assist   Supine to sit: Mod assist   Sit to sidelying: Mod assist General bed mobility comments: mod assist for rolling with hand over hand assist to reach rail and pull, performed with pericare. Mod assist for trunk and LE support to rise to EOB, able to initiate LLE out of bed but needed help with RLE. Mod assist for LEs back into bed, max VC for technique to lie down onto Lt side.    Transfers Overall transfer level: Needs assistance Equipment used: Rolling walker (2 wheels) Transfers: Sit to/from Stand Sit to Stand: From elevated surface           General transfer comment: Unable to rise, pt falling backwards with asterixis type movements after sitting up for a bit.    Ambulation/Gait                   Stairs             Wheelchair Mobility    Modified Rankin (Stroke Patients Only)       Balance Overall balance assessment: Needs assistance Sitting-balance support: No upper extremity supported, Feet supported Sitting balance-Leahy Scale: Fair Sitting balance - Comments: sits EOB without support initially, after about 5 min, pt leaning posteriorly, needed help Postural control: Posterior lean                                  Cognition Arousal/Alertness: Lethargic Behavior During Therapy: Flat affect Overall Cognitive Status: Impaired/Different from baseline Area of Impairment: Problem solving                             Problem Solving: Decreased  initiation, Slow processing, Requires verbal cues, Difficulty sequencing, Requires tactile cues          Exercises      General Comments General comments (skin integrity, edema, etc.): SpO2 99% on 2L, HR 112      Pertinent Vitals/Pain Pain Assessment Pain Assessment: No/denies pain    Home Living                          Prior Function            PT Goals (current goals can now be found in the care plan section)  Acute Rehab PT Goals Patient Stated Goal: Be able to return home PT Goal Formulation: With patient Time For Goal Achievement: 09/19/22 Potential to Achieve Goals: Fair Progress towards PT goals: Progressing toward goals    Frequency    Min 2X/week      PT Plan Current plan remains appropriate    Co-evaluation              AM-PAC PT "6 Clicks" Mobility   Outcome Measure  Help needed turning from your back to your side while in a flat bed without using bedrails?: A Lot Help needed moving from lying on your back to sitting on the side of a flat bed without using bedrails?: A Lot Help needed moving to and from a bed to a chair (including a wheelchair)?: A Lot Help needed standing up from a chair using your arms (e.g., wheelchair or bedside chair)?: Total Help needed to walk in hospital room?: Total Help needed climbing 3-5 steps with a railing? : Total 6 Click Score: 9    End of Session Equipment Utilized During Treatment: Gait belt Activity Tolerance: Patient limited by fatigue Patient left: with call bell/phone within reach;in bed;with bed alarm set (Chair position, MD entering room) Nurse Communication: Mobility status PT Visit Diagnosis: Unsteadiness on feet (R26.81);Muscle weakness (generalized) (M62.81)     Time: 1524-1600 PT Time Calculation (min) (ACUTE ONLY): 36 min  Charges:  $Therapeutic Activity: 23-37 mins                     Candie Mile, PT, DPT Physical Therapist Acute Rehabilitation Services [place]    Ellouise Newer 09/10/2022, 4:12 PM

## 2022-09-14 NOTE — Progress Notes (Addendum)
Rounding Note    Patient Name: Andrew Brandt Date of Encounter: 09/15/2022  [place] Cardiologist: Sanda Klein, MD   Subjective   Patient continues to appear quite frail and ill. Mental status slightly declined from yesterday with patient less alert and providing 1-2 word answers to questions. Denies chest pain and palpitations.  Inpatient Medications    Scheduled Meds:  Chlorhexidine Gluconate Cloth  6 each Topical Daily   feeding supplement  237 mL Oral BID BM   guaiFENesin  600 mg Oral BID   metoprolol tartrate  75 mg Oral BID   mycophenolate  180 mg Oral BID   predniSONE  5 mg Oral Q breakfast   sulfamethoxazole-trimethoprim  1 tablet Oral Q M,W,F   Continuous Infusions:  ceFEPime (MAXIPIME) IV Stopped (09/09/2022 0905)   metronidazole 500 mg (09/19/2022 0112)   vancomycin 1,000 mg (09/13/22 2236)   PRN Meds: acetaminophen **OR** acetaminophen, benzonatate, iohexol, iohexol, levalbuterol, ondansetron **OR** ondansetron (ZOFRAN) IV, mouth rinse, senna-docusate   Vital Signs    Vitals:   09/13/2022 0333 09/06/2022 0400 09/16/2022 0833 08/27/2022 0858  BP: (!) 141/78 138/75 91/60 (!) 141/90  Pulse: (!) 105 89 99 (!) 110  Resp: 20 (!) 21 (!) 26 (!) 26  Temp: 97.9 F (36.6 C)  97.6 F (36.4 C)   TempSrc: Axillary  Oral   SpO2: 99% 99% 98% 100%  Weight:      Height:        Intake/Output Summary (Last 24 hours) at 09/11/2022 0924 Last data filed at 09/02/2022 0334 Gross per 24 hour  Intake 1000 ml  Output 575 ml  Net 425 ml      09/11/2022    8:40 PM 08/26/2022    8:47 AM 04/30/2022    8:23 AM  Last 3 Weights  Weight (lbs) 227 lb 4.7 oz 230 lb 257 lb 8 oz  Weight (kg) 103.1 kg 104.327 kg 116.801 kg      Telemetry    Atrial fibrillation. Overnight ventricular rates 90s, this morning 100-120s - Personally Reviewed  ECG    No new tracing - Personally Reviewed  Physical Exam   GEN: No acute distress. Ill and frail appearing Neck: No  JVD Cardiac: irregularly irregular no murmurs, rubs, or gallops.  Respiratory: diffuse inspiratory wheezing with right side rhonchi.  GI: Soft, nontender, non-distended  MS: Bilateral lower extremity edema R>L, 2+. Lower extremities are warm to touch. Bilateral upper extremities with delayed capillary refill, cool fingers. Neuro:  limited assessment due to encephalopathy Psych: Normal affect   Labs    High Sensitivity Troponin:  No results for input(s): "TROPONINIHS" in the last 720 hours.   Chemistry Recent Labs  Lab 09/08/22 0152 09/09/22 0746 09/12/22 0810 09/13/22 0853 09/05/2022 0148  NA 137   < > 138 139 139  K 3.9   < > 4.2 3.6 3.6  CL 105   < > 103 107 107  CO2 20*   < > '24 23 22  '$ GLUCOSE 95   < > 102* 87 118*  BUN 26*   < > 25* 25* 29*  CREATININE 1.68*   < > 1.57* 1.65* 1.83*  CALCIUM 8.5*   < > 9.4 8.8* 8.6*  PROT 5.3*  --   --   --   --   ALBUMIN 2.5*  --   --  2.2* 2.0*  AST 14*  --   --   --   --   ALT 9  --   --   --   --  ALKPHOS 29*  --   --   --   --   BILITOT 1.0  --   --   --   --   GFRNONAA 43*   < > 46* 44* 38*  ANIONGAP 12   < > '11 9 10   '$ < > = values in this interval not displayed.    Lipids  Recent Labs  Lab 09/13/22 0209  CHOL 177  TRIG 138  HDL 35*  LDLCALC 114*  CHOLHDL 5.1    Hematology Recent Labs  Lab 09/12/22 0810 09/13/22 0209 09/06/2022 0148  WBC 7.7 9.1 7.8  RBC 3.79* 3.34* 3.20*  HGB 10.7* 9.3* 8.8*  HCT 34.7* 30.6* 29.5*  MCV 91.6 91.6 92.2  MCH 28.2 27.8 27.5  MCHC 30.8 30.4 29.8*  RDW 18.7* 18.6* 18.5*  PLT 166 PLATELET CLUMPS NOTED ON SMEAR, UNABLE TO ESTIMATE 104*   Thyroid No results for input(s): "TSH", "FREET4" in the last 168 hours.  BNPNo results for input(s): "BNP", "PROBNP" in the last 168 hours.  DDimer No results for input(s): "DDIMER" in the last 168 hours.   Radiology    IR IVC FILTER PLMT / S&I Burke Keels GUID/MOD SED  Result Date: 09/13/2022 CLINICAL DATA:  Acute right lower extremity DVT and need for  IVC filter as the patient has developed rectal bleeding on anticoagulation. EXAM: 1. ULTRASOUND GUIDANCE FOR VASCULAR ACCESS OF THE RIGHT INTERNAL JUGULAR VEIN. 2. IVC VENOGRAM. 3. PERCUTANEOUS IVC FILTER PLACEMENT. ANESTHESIA/SEDATION: The patient did not receive formal IV conscious sedation. CONTRAST:  CO2 gas FLUOROSCOPY TIME:  92.4 mGy PROCEDURE: The procedure, risks, benefits, and alternatives were explained to the patient's significant other. Questions regarding the procedure were encouraged and answered. The patient's significant other understands and consents to the procedure. A time-out was performed prior to initiating the procedure. The right neck was prepped with chlorhexidine in a sterile fashion, and a sterile drape was applied covering the operative field. A sterile gown and sterile gloves were used for the procedure. Local anesthesia was provided with 1% Lidocaine. Ultrasound was utilized to confirm patency of the right internal jugular vein. Under direct ultrasound guidance, a 21 gauge needle was advanced into the right internal jugular vein with ultrasound image documentation performed. After securing access with a micropuncture dilator, a guidewire was advanced into the inferior vena cava. A deployment sheath was advanced over the guidewire. This was utilized to perform IVC venography. The deployment sheath was further positioned in an appropriate location for filter deployment. A Bard Denali IVC filter was then advanced in the sheath. This was then fully deployed in the infrarenal IVC. Final filter position was confirmed with a fluoroscopic spot image. After the procedure the sheath was removed and hemostasis obtained with manual compression. COMPLICATIONS: None. FINDINGS: IVC venography demonstrates a normal caliber IVC with no evidence of thrombus. CO2 gas was utilized due to underlying chronic kidney disease. Renal veins are identified bilaterally. The IVC filter was successfully positioned  below the level of the renal veins and is appropriately oriented. This IVC filter has both permanent and retrievable indications. IMPRESSION: Placement of percutaneous IVC filter in infrarenal IVC. IVC venogram shows no evidence of IVC thrombus and normal caliber of the inferior vena cava. This filter does have both permanent and retrievable indications. PLAN: This IVC filter is potentially retrievable. The patient will be assessed for filter retrieval by Interventional Radiology in approximately 8-12 weeks. Further recommendations regarding filter retrieval, continued surveillance or declaration of device permanence, will be made  at that time. Electronically Signed   By: Aletta Edouard M.D.   On: 09/13/2022 16:36   CT ABDOMEN PELVIS WO CONTRAST  Result Date: 09/13/2022 CLINICAL DATA:  Bowel obstruction suspected. X-ray yesterday showing sigmoid dilatation concerning for volvulus. EXAM: CT ABDOMEN AND PELVIS WITHOUT CONTRAST TECHNIQUE: Multidetector CT imaging of the abdomen and pelvis was performed following the standard protocol without IV contrast. RADIATION DOSE REDUCTION: This exam was performed according to the departmental dose-optimization program which includes automated exposure control, adjustment of the mA and/or kV according to patient size and/or use of iterative reconstruction technique. COMPARISON:  Portable flat plate abdomen study yesterday at 5:49 p.m., and CT abdomen pelvis with oral contrast 07/31/2018. FINDINGS: Lower chest: There is mild cardiomegaly. No pericardial effusion or visible significant calcific CAD. There are small layering pleural effusions. There are small areas of ground-glass disease in the medial segment of the right middle lobe and in the peripheral lingula suggesting a viral pneumonitis or ground-glass edema. There is consolidation or atelectasis anterior to the effusions in the lower lobes. Hepatobiliary: Not well seen due to abundant breathing motion artifact, and  streak artifact from the patient's arms in the field. Small scattered hepatic cysts are again faintly visible. There is no obvious mass. Gallbladder contains dense layering fluid proximally and is air distended distally, potentially indicating evidence of prior sphincterotomy. Other etiologies are possible. There is no wall thickening or biliary dilatation. The dense layering material could be dense sludge, gravel, or contrast and is not well characterized due to breathing motion. Pancreas: Partially atrophic, without contrast otherwise unremarkable. Spleen: Unremarkable without contrast. Adrenals/Urinary Tract: End-stage renal atrophy. The cortex replaced by numerous cysts varying in density. Largest on the left is 1.5 cm in the upper pole. Largest on the right is 1.7 cm in the upper pole. Due to breathing motion cyst density characterization unable to be performed. There is a transplant kidney in the right iliac fossa. No focal abnormality of the unenhanced cortex is seen. No intrarenal stone is seen. There is mild hydroureteronephrosis without evidence of ureteral stone. There is no adrenal mass. No native urinary stone or obstruction. Unremarkable bladder for the degree of distention. Stomach/Bowel: The stomach is contracted. There are thickened folds and mild stranding involving the descending duodenum consistent with duodenitis. The remainder of unopacified small bowel is unremarkable. The appendix is normal. There is mild gas distention without dilatation in the transverse colon. The ascending and descending colon largely decompressed. There is advanced diverticulosis in the distal descending and proximal sigmoid colon with increased inflammatory stranding over portions consistent with acute diverticulitis. At about the junction of the mid and distal thirds of the sigmoid segment there is circumferential wall thickening left of the midline on 3:48 and an abrupt caliber change to dilatation in the distal third  of the sigmoid which is dilated up to 14 cm. The mid sigmoid leading up to this is tortuous but does not appear twisted such as with volvulus. The wall thickening at the point of transition is concerning for infiltrating neoplasm. Distal this there is mild-to-moderate retained stool in the rectum including a 4.8 cm densely rim calcified stool ball, and there is mild thickening of the rectum without pneumatosis. Vascular/Lymphatic: Aortic atherosclerosis. No enlarged abdominal or pelvic lymph nodes. Reproductive: Prior prostatectomy. Other: Minimal ascites posterior deep pelvis. No free air, abscess or free bleed. The there are no incarcerated hernias. Musculoskeletal: Bridging syndesmophytes thoracic spine. Osteopenia and degenerative change lumbar spine. Acquired spinal stenosis L4-5. IMPRESSION:  1. 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 The mid sigmoid leading up to this is tortuous but does not appear twisted such as with volvulus. There is mild thickening of the rectum and mild-to-moderate retained stool in the rectum. 2. Descending and proximal sigmoid advanced diverticulosis with evidence of acute diverticulitis over portions. 3. No free air, abscess or free bleed. 4. Small pleural effusions with ground-glass disease in the right middle lobe and lingula which could be due to ground-glass edema or viral pneumonitis, atelectasis or pneumonia in the lower lobes about the effusions. 5. Cardiomegaly. 6. End-stage native renal atrophy with transplant kidney in the right iliac fossa. There is mild hydroureteronephrosis of the transplant kidney without evidence of ureteral stone. The transplant is new from 2020. 7. Aortic atherosclerosis. 8. Small hepatic cysts. 9. Air distended gallbladder with dense layering material, could be dense sludge, gravel, or contrast. Etiology of the air in the  gallbladder unknown but there is no wall thickening or biliary dilatation. Has the patient had a prior sphincterotomy? 10. Prior prostatectomy. 11. Osteopenia and degenerative change. Acquired spinal stenosis L4-5. Thoracic spine bridging syndesmophytes. Aortic Atherosclerosis (ICD10-I70.0). Electronically Signed   By: Telford Nab M.D.   On: 09/13/2022 01:27   VAS [place] LOWER EXTREMITY VENOUS (DVT)  Result Date: 09/12/2022  Lower Venous DVT Study Patient Name:  Andrew Brandt  Date of Exam:   09/12/2022 Medical Rec #: [id number]         Accession #:    [id number] Date of Birth: 01-09-1951         Patient Gender: M Patient Age:   9 years Exam Location:  [facility] Procedure:      VAS [place] LOWER EXTREMITY VENOUS (DVT) Referring Phys: NISCHAL NARENDRA --------------------------------------------------------------------------------  Indications: Edema RT>LT, Covid-19, shortness of breath.  Comparison Study: No prior studies. Performing Technologist: Darlin Coco RDMS, RVT  Examination Guidelines: A complete evaluation includes B-mode imaging, spectral Doppler, color Doppler, and power Doppler as needed of all accessible portions of each vessel. Bilateral testing is considered an integral part of a complete examination. Limited examinations for reoccurring indications may be performed as noted. The reflux portion of the exam is performed with the patient in reverse Trendelenburg.  +---------+---------------+---------+-----------+----------+--------------+ RIGHT    CompressibilityPhasicitySpontaneityPropertiesThrombus Aging +---------+---------------+---------+-----------+----------+--------------+ CFV      Partial        Yes      Yes                  Acute          +---------+---------------+---------+-----------+----------+--------------+ SFJ      Full                                                        +---------+---------------+---------+-----------+----------+--------------+ FV  Prox  None           No       No                   Acute          +---------+---------------+---------+-----------+----------+--------------+ FV Mid   None           No       No  Acute          +---------+---------------+---------+-----------+----------+--------------+ FV DistalNone           No       No                   Acute          +---------+---------------+---------+-----------+----------+--------------+ PFV      Full                                                        +---------+---------------+---------+-----------+----------+--------------+ POP      None           No       No                   Acute          +---------+---------------+---------+-----------+----------+--------------+ PTV      Full                                                        +---------+---------------+---------+-----------+----------+--------------+ PERO     Partial        Yes      Yes                  Acute          +---------+---------------+---------+-----------+----------+--------------+   +---------+---------------+---------+-----------+----------+--------------+ LEFT     CompressibilityPhasicitySpontaneityPropertiesThrombus Aging +---------+---------------+---------+-----------+----------+--------------+ CFV      Full           Yes      Yes                                 +---------+---------------+---------+-----------+----------+--------------+ SFJ      Full                                                        +---------+---------------+---------+-----------+----------+--------------+ FV Prox  Full                                                        +---------+---------------+---------+-----------+----------+--------------+ FV Mid   Full                                                        +---------+---------------+---------+-----------+----------+--------------+ FV DistalFull                                                         +---------+---------------+---------+-----------+----------+--------------+ PFV  Full                                                        +---------+---------------+---------+-----------+----------+--------------+ POP      Full           Yes      Yes                                 +---------+---------------+---------+-----------+----------+--------------+ PTV      Full                                                        +---------+---------------+---------+-----------+----------+--------------+ PERO     Full                                                        +---------+---------------+---------+-----------+----------+--------------+     Summary: RIGHT: - Findings consistent with acute deep vein thrombosis involving the right common femoral vein, right femoral vein, right popliteal vein, and right peroneal veins. - No cystic structure found in the popliteal fossa. - Common femoral vein obstruction doesn't appear to extend above inguinal ligament.  LEFT: - There is no evidence of deep vein thrombosis in the lower extremity.  - No cystic structure found in the popliteal fossa.  *See table(s) above for measurements and observations. Electronically signed by Jamelle Haring on 09/12/2022 at 10:03:53 PM.    Final    DG Abd Portable 1V  Result Date: 09/12/2022 CLINICAL DATA:  Encephalopathy EXAM: PORTABLE ABDOMEN - 1 VIEW COMPARISON:  CT 07/30/2018 FINDINGS: Diffuse air-filled colon identified. There is a dilated loop of presumed sigmoid colon extending into the midabdomen which has a diameter of up to 10.7 cm. Prior CT scan also demonstrated a tortuous dilated colon with diameter of up to 8.2 cm. More proximally the colon is air-filled but nondilated as is the small bowel. Please correlate for any clinical evidence of developing volvulus or obstruction. No obvious free air on these portable supine radiographs. There is a rounded structure in the central  pelvis which has presumable rim calcification. This was not seen on the remote CT scan. A pelvic lesion is possible. Please correlate for any known history or dedicated workup. Critical Value/emergent results were called by telephone at the time of interpretation on 09/12/2022 at 3:08 pm to provider Dr. Dareen Piano , who verbally acknowledged these results. IMPRESSION: Severely dilated loop of presumably sigmoid colon in the midabdomen extending out of the pelvis. More proximally the large bowel is air-filled and distended but not dilated and there are air-filled loops of small bowel. Please correlate for any developing signs of distal colonic obstruction or volvulus. There is a rim calcified rounded structure in the central pelvis measuring 5.1 cm in diameter. This was not seen on the CT scan of 2020. Etiology is uncertain. Please correlate for a calcified pelvic lesion, foreign body or other process. With these 2 findings, a CT scan may  be of some benefit as clinically appropriate. IV contrast would be preferable if possible Electronically Signed   By: Jill Side M.D.   On: 09/12/2022 18:33   MR BRAIN WO CONTRAST  Result Date: 09/12/2022 CLINICAL DATA:  acute encephalopathy, afib with AC held, EXAM: MRI HEAD WITHOUT CONTRAST TECHNIQUE: Multiplanar, multiecho pulse sequences of the brain and surrounding structures were obtained without intravenous contrast. COMPARISON:  CT head from the same day. FINDINGS: Brain: No acute infarction, hemorrhage, hydrocephalus, extra-axial collection or mass lesion. Patchy T2/FLAIR hyperintensities with the white matter, nonspecific but compatible with chronic microvascular ischemic disease that is mild for patient age. Vascular: Major arterial flow voids are maintained at the skull base. Skull and upper cervical spine: Normal marrow signal. Sinuses/Orbits: Clear sinuses.  No acute orbital findings. Other: No mastoid effusions. IMPRESSION: No evidence of acute intracranial  abnormality. Electronically Signed   By: Margaretha Sheffield M.D.   On: 09/12/2022 17:12   CT HEAD WO CONTRAST (5MM)  Result Date: 09/12/2022 CLINICAL DATA:  Mental status change, unknown cause EXAM: CT HEAD WITHOUT CONTRAST TECHNIQUE: Contiguous axial images were obtained from the base of the skull through the vertex without intravenous contrast. RADIATION DOSE REDUCTION: This exam was performed according to the departmental dose-optimization program which includes automated exposure control, adjustment of the mA and/or kV according to patient size and/or use of iterative reconstruction technique. COMPARISON:  None Available. FINDINGS: Brain: No evidence of acute infarction, hemorrhage, hydrocephalus, extra-axial collection or mass lesion/mass effect. Scattered low-density changes within the periventricular and subcortical white matter most compatible with chronic microvascular ischemic change. Vascular: No hyperdense vessel or unexpected calcification. Skull: Normal. Negative for fracture or focal lesion. Sinuses/Orbits: No acute finding. Other: None. IMPRESSION: 1. No acute intracranial abnormality. 2. Chronic microvascular ischemic change of the white matter. Electronically Signed   By: Davina Poke D.O.   On: 09/12/2022 09:35    Cardiac Studies   09/05/22 TTE   IMPRESSIONS     1. Left ventricular ejection fraction, by estimation, is 60 to 65%. The  left ventricle has normal function. The left ventricle has no regional  wall motion abnormalities. There is mild left ventricular hypertrophy of  the basal-septal segment. Left  ventricular diastolic function could not be evaluated.   2. Right ventricular systolic function is normal. The right ventricular  size is normal.   3. Left atrial size was mildly dilated.   4. The mitral valve is normal in structure. Trivial mitral valve  regurgitation. No evidence of mitral stenosis.   5. The aortic valve is tricuspid. Aortic valve regurgitation is  not  visualized. Aortic valve sclerosis is present, with no evidence of aortic  valve stenosis.   FINDINGS   Left Ventricle: Left ventricular ejection fraction, by estimation, is 60  to 65%. The left ventricle has normal function. The left ventricle has no  regional wall motion abnormalities. The left ventricular internal cavity  size was normal in size. There is   mild left ventricular hypertrophy of the basal-septal segment. Left  ventricular diastolic function could not be evaluated due to atrial  fibrillation. Left ventricular diastolic function could not be evaluated.   Right Ventricle: The right ventricular size is normal. No increase in  right ventricular wall thickness. Right ventricular systolic function is  normal.   Left Atrium: Left atrial size was mildly dilated.   Right Atrium: Right atrial size was normal in size.   Pericardium: There is no evidence of pericardial effusion.   Mitral  Valve: The mitral valve is normal in structure. Trivial mitral  valve regurgitation. No evidence of mitral valve stenosis.   Tricuspid Valve: The tricuspid valve is normal in structure. Tricuspid  valve regurgitation is trivial. No evidence of tricuspid stenosis.   Aortic Valve: The aortic valve is tricuspid. Aortic valve regurgitation is  not visualized. Aortic valve sclerosis is present, with no evidence of  aortic valve stenosis.   Pulmonic Valve: The pulmonic valve was normal in structure. Pulmonic valve  regurgitation is not visualized. No evidence of pulmonic stenosis.   Aorta: The aortic root is normal in size and structure.   Venous: The inferior vena cava was not well visualized.   IAS/Shunts: No atrial level shunt detected by color flow Doppler.   Patient Profile     72 y.o. male  with ESRD s/p subsequent kidney transplant 01/2019 followed at [facility] (subsequent CKD stage IV with Cr 2.71 in 07/2022) on chronic immunosuppressants, prostate CA s/p prostatectomy 2016, HTN, DM,  prior anemia on labs who presented to the hospital with URI symptoms x2 weeks, initially improved then onset of diarrhea, recurrent cough, and weakness about 1 week ago. Was orthostatic this admission, also in new onset Afib of unclear chronicity.  Assessment & Plan    New atrial fibrillation    Patient found with atrial fibrillation this admission, unknown duration. Possibly brought on by recent acute viral illness. On admission, patient was taking Coreg. This was changed to Metoprolol for improved rate control.   Anticoagulation has been complicated by hematochezia. Eliquis held. 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 HR noted to be increased yesterday and Metoprolol was increased to '75mg'$  BID. Rates now 90s-120s. 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   Hypertension   Patient was taking coreg '25mg'$  BID at home prior to admission. This was changed to Metoprolol for better rate control in the setting of new afib. Unfortunately patient is now trending hypertensive. Of note, BP is being checked on patient's leg due to limb restrictions, unclear if fully accurate.    BP appears better controlled today, still hypertensive but now in the 0000000 systolic. With acute illness, would allow for some hypertension   Acute hypoxic respiratory failure   Patient with acute onset of dyspnea 2/20-2/21, necessitating small amount of O2 by nasal cannula. Received IV lasix with some improvement. Etiology infection vs CHF. Net positive 5L earlier this admission, now down to ~3L with diuresis. He continues to receive ~1L per day as IV piggyback with abx. CT abd/pelvis on 2/22 noted ground glass disease in right middle lobe, small layering pleural effusions.  At this time, continue to suspect that dyspnea, abnormal chest imaging are secondary to infectious process.  Abx per  primary team. Limit IV piggyback as able.  Lower extremity edema  Albumin continues to fall with limited oral intake and this is likely contributing to lower extremity edema. R>L edema with known DVT.      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   No changes to add .  Multiple comorbid illnesses and goals of therapy discussion are ongoing.  Please call with further questions.

## 2022-09-14 NOTE — Progress Notes (Signed)
Palliative Care Progress Note, Assessment & Plan   Patient Name: Andrew Brandt       Date: 09/11/2022 DOB: 03/07/1951  Age: 72 y.o. MRN#: [id number] Attending Physician: Aldine Contes, MD Primary Care Physician: Benito Mccreedy, MD Admit Date: 09/18/2022  Subjective: Patient is lying in bed in NAD.  He acknowledges my presence and is able to make his wishes known.  He is a man of few words but is able to communicate his needs/wishes.  His significant other Andrew Brandt is at bedside.  HPI: 71 y.o. male  with past medical history of ESRD s/p renal transplant (immunosuppression), prostate cancer s/p prostatectomy, type 2 diabetes, and HTN admitted on 09/06/2022 with generalized weakness, diarrhea, and poor appetite. Pt found to be Covid 19 positive.   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   PMT was consulted to discuss goals of care.  Summary of counseling/coordination of care: After reviewing the patient's chart and assessing the patient at bedside, I spoke with patient and Andrew Brandt in regards to plan and goals of care.  I attempted to elicit values and goals important to the patient.  Andrew Brandt states that she wants to ensure the medical team is doing everything feasible and within reason to ensure patient's kidney issues are being addressed.  She shares concern that if she enacts hospice benefits then patient's kidneys treatments will stop - hastening his death.    We discussed hospice and hospice philosophy again.  Andrew Brandt shares she would like to have patient transferred to a hospice facility at discharge.  However, during hospitalization she  wants to optimize his medical treatment.  She remains in agreement with DNR.  She does not want to escalate care to ICU level status.  She wants to avoid pressors and other artificial means of sustaining patient's life.  However, she wants to treat the treatable and know she has done everything possible to improve his QOL before discharging to hospice.  Andrew Brandt wants to speak with primary team about patient's kidney function.  We discussed that next steps would be to speak with TOC in regards to choosing a hospice agency.  Andrew Brandt shares she has heard good things and would like to have patient evaluated for [facility] as [facility] is close to her home.  Andrew Brandt does not want to initiate discussions with hospice today.  She would like to speak with attending team about his kidney function and then speak with TOC in regards to hospice agency choice.  Attending made aware of above discuss. Boundaries of care and goals have been made clear. DNR remains. Treat the treatable until medically stable, then discharge to hospice facility.  Andrew Brandt does not want to initiate comfort measures at this time.  Given patient's poor functional, nutritional, and cognitive status, I believe patient is an appropriate candidate for evaluation of hospice inpatient home.  Once Andrew Brandt has her questions clarified by primary in regards to kidney treatments, my recommendation would be for [facility] to discuss hospice evaluation with Andrew Brandt.  PMT will follow peripherally.  I plan to touch base with  patient/Andrew Brandt on Monday.  Please utilize AMION for any urgent palliative needs over the weekend.  Physical Exam Vitals reviewed.  Constitutional:      General: He is not in acute distress.    Appearance: He is ill-appearing.  HENT:     Head: Normocephalic.  Eyes:     Pupils: Pupils are equal, round, and reactive to light.  Cardiovascular:     Rate and Rhythm: Tachycardia present. Rhythm irregular.     Pulses: Normal pulses.   Musculoskeletal:     Comments: MAETC, generalized weakness  Skin:    General: Skin is warm and dry.  Neurological:     Mental Status: He is alert.     Comments: Oriented to self and place  Psychiatric:        Behavior: Behavior normal.             Total Time 50 minutes   Andrew Brandt L. Ilsa Iha, FNP-BC Palliative Medicine Team Team Phone # [phone]

## 2022-09-14 NOTE — Progress Notes (Signed)
Subjective:   Summary: Andrew Brandt is a 72 y.o. year old male currently admitted on the IMTS HD#9 for generalized weakness/diarrhea with COVID.  Overnight Events:  IVC filter placed.  Remains in A-fib with rates in 90s to 100s.  HD stable.  On 2 L nasal cannula.  He is more alert, significant other not in the room at time of initial assessment.  Reiterated hospital course with him given he is more alert and interactive this morning.  Discussed multiple complications including A-fib with RVR, GI bleeds, DVT, new colonic neoplasm, thrombocytopenia, encephalopathy and answered questions.  Discussed poor prognosis.  He stated understanding.  He confirms that he wishes to remain DNR at this time.  He mentioned that he is comfortable with his girlfriend making decisions for him.  No acute symptoms at this time.  Objective:  Vital signs in last 24 hours: Vitals:   09/13/22 2026 09/07/2022 0050 08/24/2022 0333 09/15/2022 0400  BP: (!) 162/123 (!) 147/71 (!) 141/78 138/75  Pulse: (!) 105 (!) 107 (!) 105 89  Resp: '15 20 20 '$ (!) 21  Temp: 99.2 F (37.3 C) 98.3 F (36.8 C) 97.9 F (36.6 C)   TempSrc: Axillary Axillary Axillary   SpO2: 98% 100% 99% 99%  Weight:      Height:       Supplemental O2: Room Air SpO2: 99 % O2 Flow Rate (L/min): 2 L/min   Physical Exam:  General: chronically ill-appearing elderly male, somnolent but interactive today CV: normal rate, irregularly irregular rhythm, no m/r/g. Pulm: Normal respiratory effort on 2 L nasal cannula, minimal gravity dependent crackles, minimal wheezing at this time Abdomen: soft, nondistended, nontender to palpation.  Extremities: 2+ pitting edema bilaterally, of note right lower extremity is significantly more edematous compared to left lower extremity.  Extremities are warmer today.  Right extra upper extremity fistula intact. Skin: Pale Neuro: Awake and more interactive today.  Alert and oriented x 4.  Mild dysarthria.  CNs intact grossly. Mild L facial droop. BLE srength 3/5. BUE 4/5. Sensation intact diffusely.     Filed Weights   09/11/2022 0847 09/11/22 2040  Weight: 104.3 kg 103.1 kg     Intake/Output Summary (Last 24 hours) at 09/15/2022 0818 Last data filed at 08/24/2022 0334 Gross per 24 hour  Intake 1120 ml  Output 575 ml  Net 545 ml    Net IO Since Admission: 6,375.2 mL [08/28/2022 0818]  Pertinent Labs:    Latest Ref Rng & Units 09/15/2022    1:48 AM 09/13/2022    2:09 AM 09/12/2022    8:10 AM  CBC  WBC 4.0 - 10.5 K/uL 7.8  9.1  7.7   Hemoglobin 13.0 - 17.0 g/dL 8.8  9.3  10.7   Hematocrit 39.0 - 52.0 % 29.5  30.6  34.7   Platelets 150 - 400 K/uL 104  PLATELET CLUMPS NOTED ON SMEAR, UNABLE TO ESTIMATE  166        Latest Ref Rng & Units 09/08/2022    1:48 AM 09/13/2022    8:53 AM 09/12/2022    8:10 AM  CMP  Glucose 70 - 99 mg/dL 118  87  102   BUN 8 - 23 mg/dL '29  25  25   '$ Creatinine 0.61 - 1.24 mg/dL 1.83  1.65  1.57   Sodium 135 - 145 mmol/L 139  139  138   Potassium 3.5 -  5.1 mmol/L 3.6  3.6  4.2   Chloride 98 - 111 mmol/L 107  107  103   CO2 22 - 32 mmol/L '22  23  24   '$ Calcium 8.9 - 10.3 mg/dL 8.6  8.8  9.4   Blood cultures preliminarily negative 2 days LDL 114  Imaging: IR IVC FILTER PLMT / S&I /IMG GUID/MOD SED  Result Date: 09/13/2022 CLINICAL DATA:  Acute right lower extremity DVT and need for IVC filter as the patient has developed rectal bleeding on anticoagulation. EXAM: 1. ULTRASOUND GUIDANCE FOR VASCULAR ACCESS OF THE RIGHT INTERNAL JUGULAR VEIN. 2. IVC VENOGRAM. 3. PERCUTANEOUS IVC FILTER PLACEMENT. ANESTHESIA/SEDATION: The patient did not receive formal IV conscious sedation. CONTRAST:  CO2 gas FLUOROSCOPY TIME:  92.4 mGy PROCEDURE: The procedure, risks, benefits, and alternatives were explained to the patient's significant other. Questions regarding the procedure were encouraged and answered. The patient's significant other understands and consents to the procedure.  A time-out was performed prior to initiating the procedure. The right neck was prepped with chlorhexidine in a sterile fashion, and a sterile drape was applied covering the operative field. A sterile gown and sterile gloves were used for the procedure. Local anesthesia was provided with 1% Lidocaine. Ultrasound was utilized to confirm patency of the right internal jugular vein. Under direct ultrasound guidance, a 21 gauge needle was advanced into the right internal jugular vein with ultrasound image documentation performed. After securing access with a micropuncture dilator, a guidewire was advanced into the inferior vena cava. A deployment sheath was advanced Brandt the guidewire. This was utilized to perform IVC venography. The deployment sheath was further positioned in an appropriate location for filter deployment. A Bard Denali IVC filter was then advanced in the sheath. This was then fully deployed in the infrarenal IVC. Final filter position was confirmed with a fluoroscopic spot image. After the procedure the sheath was removed and hemostasis obtained with manual compression. COMPLICATIONS: None. FINDINGS: IVC venography demonstrates a normal caliber IVC with no evidence of thrombus. CO2 gas was utilized due to underlying chronic kidney disease. Renal veins are identified bilaterally. The IVC filter was successfully positioned below the level of the renal veins and is appropriately oriented. This IVC filter has both permanent and retrievable indications. IMPRESSION: Placement of percutaneous IVC filter in infrarenal IVC. IVC venogram shows no evidence of IVC thrombus and normal caliber of the inferior vena cava. This filter does have both permanent and retrievable indications. PLAN: This IVC filter is potentially retrievable. The patient will be assessed for filter retrieval by Interventional Radiology in approximately 8-12 weeks. Further recommendations regarding filter retrieval, continued surveillance or  declaration of device permanence, will be made at that time. Electronically Signed   By: Aletta Edouard M.D.   On: 09/13/2022 16:36     EKG: My EKG interpretation is as follows: A-fib in and out of RVR  Assessment/Plan:   Principal Problem:   COVID-19 virus infection   Patient Summary: Andrew Brandt is a 72 year old with a history of ESRD s/p renal transplant on immunosuppression, prostate cancer s/p prostatectomy, T2DM and hypertension who presents via EMS for generalized weakness, diarrhea and poor appetite and found to have COVID-19 infection.    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  CT follow-up showed mid/distal sigmoid colonic neoplasm with dilated sigmoid loops but no volvulus. Engaged [place] today given multiple complications and poor overall prognosis with limited treatment options.  Confirmed DNR.  Significant other  is thinking about transition to hospice once he is optimized is much as possible in the hospital setting. -f\u palliative recs, family/patient considering comfort care/hospice -DNR  #Multifactorial encephalopathy All workup yesterday unremarkable overall. Stroke workup negative. Likely multifactorial encephalopathy with element of delirium/debility -delirium precautions  #Thrombocytopenia Patient high risk for consumptive thrombocytopenia, possibly DIC with multiple comorbidities.  He also possibly received Lovenox early in his admission on day 1 or 2, but low suspicion for HIT overall.  TTP would also be a possibility. - Follow-up LDH, add on hepatic function panel - Follow-up DIC panel  #Painless hematochezia #Acute blood loss anemia #Hx of diverticulosis Eliquis still held. Likely colonic neoplasm and diverticulitis causing GIB based on CT. GOC as above. -Greatly appreciate GI assistance and recommendations -trend CBC, transfuse for hgb <7 and/or hemodynamic instability -Hold  Eliquis  #Concern for pneumonia #Pulmonary edema #COVID-19 infection #Generalized weakness #Diarrhea Remains afebrile without leukocytosis.  If he does not seem to be improving on vancomycin and cefepime, would consider changing his dose of Bactrim to therapeutic for possible PJP.  He is comfortable on 2 L nasal cannula. -Continue Vanco, cefepime.  Considered increased dose of Bactrim as needed -Follow-up blood cultures - DuoNebs every 4 hours as needed -O2 supplementation as needed -Incentive spirometer, pulm toilet -Mucinex 600 mg twice daily -PT/OT re-eval   #BLE edema Confirm right DVT.  IR consulted for IVC filter placement.  Also will need VQ scan at some point, primarily for prognostic/diagnostic purposes as [facility] is contraindicated.  #New onset paroxysmal A-fib In and out of RVR, unsure if he could tolerate more rate control, should be lenient with multiple other comorbidities. He is HD stable. Goc as above. - Appreciate any further cardiology recs regarding rate control.   - Trend mag, BMP, replete as needed - Hold Eliquis  #ESRD s/p renal transplant #AKI on CKD4? Kidney function stable.  Per [facility] transplant, his belatacept administration can wait 6 weeks from last one.  If he is not able to get dose by next Thursday, 2/29, will reach back out for alternatives. -Mycophenolate 180 mg twice daily -Prednisone 5 mg daily -Bactrim 400-80 mg every Monday, Wednesday and Friday - Appreciate [facility] assistance with immunosuppression   #HTN Stable on metoprolol tartate '75mg'$  BID as above.   #T2DM Per spouse, patient was recently diagnosed with diabetes and started on metformin 500 mg twice daily. A1c 7.0 6 months ago, 6.1 here. -SSI w/ meals, CBG monitoring -Hold metformin  Diet: Heart Healthy IVF:  VTE: held in setting of gi bleed. Code: DNR. GOC pending PT/OT recs: Pending TOC recs:  Family Update:   Dispo: Anticipated discharge pending clinical stability and ongoing  workup  Andrew Galas, MD PGY-1 Internal Medicine Resident Pager: [phone]  Please contact the on call pager after 5 pm and on weekends at [phone].

## 2022-09-15 DIAGNOSIS — Z789 Other specified health status: Secondary | ICD-10-CM

## 2022-09-15 DIAGNOSIS — U071 COVID-19: Secondary | ICD-10-CM | POA: Diagnosis not present

## 2022-09-15 DIAGNOSIS — R531 Weakness: Secondary | ICD-10-CM | POA: Diagnosis not present

## 2022-09-15 DIAGNOSIS — Z515 Encounter for palliative care: Secondary | ICD-10-CM | POA: Diagnosis not present

## 2022-09-15 DIAGNOSIS — Z66 Do not resuscitate: Secondary | ICD-10-CM

## 2022-09-15 DIAGNOSIS — E860 Dehydration: Secondary | ICD-10-CM | POA: Diagnosis not present

## 2022-09-15 LAB — RETICULOCYTES
Immature Retic Fract: 21.9 % — ABNORMAL HIGH (ref 2.3–15.9)
RBC.: 3.06 MIL/uL — ABNORMAL LOW (ref 4.22–5.81)
Retic Count, Absolute: 25.7 10*3/uL (ref 19.0–186.0)
Retic Ct Pct: 0.8 % (ref 0.4–3.1)

## 2022-09-15 LAB — COMPREHENSIVE METABOLIC PANEL
ALT: 9 U/L (ref 0–44)
AST: 14 U/L — ABNORMAL LOW (ref 15–41)
Albumin: 1.9 g/dL — ABNORMAL LOW (ref 3.5–5.0)
Alkaline Phosphatase: 35 U/L — ABNORMAL LOW (ref 38–126)
Anion gap: 10 (ref 5–15)
BUN: 34 mg/dL — ABNORMAL HIGH (ref 8–23)
CO2: 21 mmol/L — ABNORMAL LOW (ref 22–32)
Calcium: 8.7 mg/dL — ABNORMAL LOW (ref 8.9–10.3)
Chloride: 106 mmol/L (ref 98–111)
Creatinine, Ser: 2.22 mg/dL — ABNORMAL HIGH (ref 0.61–1.24)
GFR, Estimated: 31 mL/min — ABNORMAL LOW (ref >60)
Glucose, Bld: 156 mg/dL — ABNORMAL HIGH (ref 70–99)
Potassium: 3.4 mmol/L — ABNORMAL LOW (ref 3.5–5.1)
Sodium: 137 mmol/L (ref 135–145)
Total Bilirubin: 1 mg/dL (ref 0.3–1.2)
Total Protein: 5 g/dL — ABNORMAL LOW (ref 6.5–8.1)

## 2022-09-15 LAB — CBC WITH DIFFERENTIAL/PLATELET
Abs Immature Granulocytes: 0.09 10*3/uL — ABNORMAL HIGH (ref 0.00–0.07)
Basophils Absolute: 0 10*3/uL (ref 0.0–0.1)
Basophils Relative: 0 %
Eosinophils Absolute: 0 10*3/uL (ref 0.0–0.5)
Eosinophils Relative: 1 %
HCT: 28.3 % — ABNORMAL LOW (ref 39.0–52.0)
Hemoglobin: 8.4 g/dL — ABNORMAL LOW (ref 13.0–17.0)
Immature Granulocytes: 2 %
Lymphocytes Relative: 13 %
Lymphs Abs: 0.7 10*3/uL (ref 0.7–4.0)
MCH: 27.3 pg (ref 26.0–34.0)
MCHC: 29.7 g/dL — ABNORMAL LOW (ref 30.0–36.0)
MCV: 91.9 fL (ref 80.0–100.0)
Monocytes Absolute: 0.2 10*3/uL (ref 0.1–1.0)
Monocytes Relative: 3 %
Neutro Abs: 4.4 10*3/uL (ref 1.7–7.7)
Neutrophils Relative %: 81 %
Platelets: 126 10*3/uL — ABNORMAL LOW (ref 150–400)
RBC: 3.08 MIL/uL — ABNORMAL LOW (ref 4.22–5.81)
RDW: 18.6 % — ABNORMAL HIGH (ref 11.5–15.5)
WBC: 5.3 10*3/uL (ref 4.0–10.5)
nRBC: 0.4 % — ABNORMAL HIGH (ref 0.0–0.2)

## 2022-09-15 LAB — TECHNOLOGIST SMEAR REVIEW: Clinical Information: ELEVATED

## 2022-09-15 LAB — GLUCOSE, CAPILLARY
Glucose-Capillary: 121 mg/dL — ABNORMAL HIGH (ref 70–99)
Glucose-Capillary: 115 mg/dL — ABNORMAL HIGH (ref 70–99)
Glucose-Capillary: 116 mg/dL — ABNORMAL HIGH (ref 70–99)

## 2022-09-15 MED ORDER — POLYVINYL ALCOHOL 1.4 % OP SOLN: 1.0000 [drp] | Freq: Four times a day (QID) | OPHTHALMIC | Status: DC | PRN

## 2022-09-15 MED ORDER — HALOPERIDOL LACTATE 2 MG/ML PO CONC: 2.0000 mg | Freq: Four times a day (QID) | ORAL | Status: DC | PRN

## 2022-09-15 MED ORDER — LORAZEPAM 2 MG/ML PO CONC: 1.0000 mg | ORAL | Status: DC | PRN

## 2022-09-15 MED ORDER — HALOPERIDOL LACTATE 2 MG/ML PO CONC: 0.5000 mg | ORAL | Status: DC | PRN

## 2022-09-15 MED ORDER — HALOPERIDOL 1 MG PO TABS: 2.0000 mg | ORAL_TABLET | Freq: Four times a day (QID) | ORAL | Status: DC | PRN

## 2022-09-15 MED ORDER — DIPHENHYDRAMINE HCL 50 MG/ML IJ SOLN: 12.5000 mg | INTRAMUSCULAR | Status: DC | PRN

## 2022-09-15 MED ORDER — HALOPERIDOL LACTATE 5 MG/ML IJ SOLN: 0.5000 mg | INTRAMUSCULAR | Status: DC | PRN

## 2022-09-15 MED ORDER — ONDANSETRON HCL 4 MG/2ML IJ SOLN: 4.0000 mg | Freq: Four times a day (QID) | INTRAMUSCULAR | Status: DC | PRN

## 2022-09-15 MED ORDER — POTASSIUM CHLORIDE CRYS ER 20 MEQ PO TBCR: 30.0000 meq | EXTENDED_RELEASE_TABLET | Freq: Once | ORAL | Status: DC

## 2022-09-15 MED ORDER — HYDROMORPHONE HCL 1 MG/ML IJ SOLN
1.0000 mg | INTRAMUSCULAR | Status: DC | PRN
  Administered 2022-09-15 – 2022-09-16 (×4): 1 mg via INTRAVENOUS
  Filled 2022-09-15 (×4): qty 1

## 2022-09-15 MED ORDER — GLYCOPYRROLATE 1 MG PO TABS: 1.0000 mg | ORAL_TABLET | ORAL | Status: DC | PRN

## 2022-09-15 MED ORDER — ONDANSETRON 4 MG PO TBDP: 4.0000 mg | ORAL_TABLET | Freq: Four times a day (QID) | ORAL | Status: DC | PRN

## 2022-09-15 MED ORDER — LORAZEPAM 1 MG PO TABS: 1.0000 mg | ORAL_TABLET | ORAL | Status: DC | PRN

## 2022-09-15 MED ORDER — GLYCOPYRROLATE 0.2 MG/ML IJ SOLN: 0.2000 mg | INTRAMUSCULAR | Status: DC | PRN

## 2022-09-15 MED ORDER — SENNA 8.6 MG PO TABS: 1.0000 | ORAL_TABLET | Freq: Every evening | ORAL | Status: DC | PRN

## 2022-09-15 MED ORDER — HALOPERIDOL LACTATE 5 MG/ML IJ SOLN: 2.0000 mg | Freq: Four times a day (QID) | INTRAMUSCULAR | Status: DC | PRN

## 2022-09-15 MED ORDER — BIOTENE DRY MOUTH MT LIQD: 15.0000 mL | OROMUCOSAL | Status: DC | PRN

## 2022-09-15 MED ORDER — MORPHINE SULFATE (PF) 2 MG/ML IV SOLN: 1.0000 mg | INTRAVENOUS | Status: DC | PRN

## 2022-09-15 MED ORDER — BIOTENE DRY MOUTH MT LIQD
15.0000 mL | Freq: Two times a day (BID) | OROMUCOSAL | Status: DC
  Administered 2022-09-15 – 2022-09-17 (×4): 15 mL via TOPICAL

## 2022-09-15 MED ORDER — HALOPERIDOL 0.5 MG PO TABS: 0.5000 mg | ORAL_TABLET | ORAL | Status: DC | PRN

## 2022-09-15 NOTE — Progress Notes (Signed)
Daily Progress Note   Patient Name: Andrew Brandt       Date: 09/15/2022 DOB: 10-27-1950  Age: 72 y.o. MRN#: [id number] Attending Physician: Aldine Contes, MD Primary Care Physician: Benito Mccreedy, MD Admit Date: 09/13/2022  Reason for Consultation/Follow-up: Establishing goals of care  Subjective:  Received notification from Dr. Allyson Sabal that after further discussions with family, they have opted for patient's transition to comfort measures only - discussed case.   I have reviewed medical records including EPIC notes and labs. Per chart review, family have requested patient's transfer to [facility]. Received report from primary RN - no acute concerns. RN reports patient has been lethargic, not eating/drinking.  Went to visit patient at bedside - no family/visitors present. Patient was lying in bed asleep - I did not attempt to wake him to preserve comfort. No signs or non-verbal gestures of pain or discomfort noted. No respiratory distress, increased work of breathing, or secretions noted. He is on 2L O2 [place].  11:06 AM Attempted to call wife/Michelle to discuss prognosis, GOC, EOL wishes, disposition, and options - no answer - confidential voicemail left and PMT phone number provided with request to return call if any questions/concerns. Informed that referral to [facility] would be sent.  Notified TOC and hospice liaison of family's changes in goals and their request for [facility]. [place] liaison states there are no available beds at [facility] today and they would contact family with updates.  Length of Stay: 10  Current Medications: Scheduled Meds:   Chlorhexidine Gluconate Cloth  6 each Topical Daily    Continuous Infusions:   PRN Meds: acetaminophen **OR**  acetaminophen, antiseptic oral rinse, glycopyrrolate **OR** glycopyrrolate **OR** glycopyrrolate, haloperidol **OR** haloperidol **OR** haloperidol lactate, levalbuterol, morphine injection, mouth rinse, polyvinyl alcohol, senna  Physical Exam Vitals and nursing note reviewed.  Constitutional:      General: He is not in acute distress.    Appearance: He is ill-appearing.  Pulmonary:     Effort: No respiratory distress.  Skin:    General: Skin is warm and dry.  Neurological:     Mental Status: He is lethargic.     Motor: Weakness present.             Vital Signs: BP 108/83 (BP Location: Left Arm)   Pulse (!) 116  Temp 98.1 F (36.7 C) (Axillary)   Resp 20   Ht '5\' 9"'$  (1.753 m)   Wt 103.1 kg   SpO2 100%   BMI 33.57 kg/m  SpO2: SpO2: 100 % O2 Device: O2 Device: Nasal Cannula O2 Flow Rate: O2 Flow Rate (L/min): 2 L/min  Intake/output summary:  Intake/Output Summary (Last 24 hours) at 09/15/2022 1049 Last data filed at 09/15/2022 0420 Gross per 24 hour  Intake 0 ml  Output 800 ml  Net -800 ml   LBM: Last BM Date : 09/12/22 Baseline Weight: Weight: 104.3 kg Most recent weight: Weight: 103.1 kg       Palliative Assessment/Data: PPS 10%      Patient Active Problem List   Diagnosis Date Noted   COVID-19 virus infection 09/09/2022   End stage renal disease ([place]) 01/05/2014   Thrombocytopenia, unspecified ([place]) 11/25/2013   ESRD (end stage renal disease) ([place]) 11/20/2013   Normocytic anemia 11/20/2013   AKI (acute kidney injury) ([place]) 11/19/2013   HTN (hypertension) 11/19/2013    Palliative Care Assessment & Plan   Patient Profile: 72 y.o. male  with past medical history of ESRD s/p renal transplant (immunosuppression), prostate cancer s/p prostatectomy, type 2 diabetes, and HTN admitted on 08/26/2022 with generalized weakness, diarrhea, and poor appetite. Pt found to be Covid 19 positive.   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  Assessment: Principal Problem:   COVID-19 virus infection   Terminal care  Recommendations/Plan: Continue full comfort measures Continue DNR/DNI as previously documented Family interested in residential hospice, requesting [place] per previous discussions - TOC consult placed; TOC and hospice liaison notified. Per hospice liaison, no available beds today Added orders for EOL symptom management and to reflect full comfort measures, as well as discontinued orders that were not focused on comfort Unrestricted visitation orders were placed per current [place] EOL visitation policy  Nursing to provide frequent assessments and administer PRN medications as clinically necessary to ensure EOL comfort PMT will continue to follow and support holistically  Symptom Management Dilaudid PRN pain/dyspnea/increased work of breathing/RR>25 Tylenol PRN pain/fever Biotin twice daily Benadryl PRN itching Robinul PRN secretions Haldol PRN agitation/delirium Ativan PRN anxiety/seizure/sleep/distress Zofran PRN nausea/vomiting Liquifilm Tears PRN dry eye Levalbuterol PRN wheezing  Goals of Care and Additional Recommendations: Limitations on Scope of Treatment: Full Comfort Care  Code Status:    Code Status Orders  (From admission, onward)           Start     Ordered   09/15/22 1023  Do not attempt resuscitation (DNR)  Continuous       Question Answer Comment  If patient has no pulse and is not breathing Do Not Attempt Resuscitation   If patient has a pulse and/or is breathing: Medical Treatment Goals COMFORT MEASURES: Keep clean/warm/dry, use medication by any route; positioning, wound care and other measures to relieve pain/suffering; use oxygen, suction/manual treatment of airway obstruction for comfort; do not transfer unless for comfort needs.   Consent:  Discussion documented in EHR or advanced directives reviewed      09/15/22 1032           Code Status History     Date Active Date Inactive Code Status Order ID Comments User Context   09/13/2022 1601 09/15/2022 1032 DNR [id number]  Jordan Hawks, [place] Inpatient   09/13/2022 1432  09/13/2022 1601 Full Code [id number]  Lacinda Axon, MD ED   11/19/2013 1848 11/26/2013 2155 Full Code [id number]  [facility] Inpatient       Prognosis:  Days - <2 weeks  Discharge Planning: Hospice facility  Care plan was discussed with primary RN, Dr. Allyson Sabal, [facility], hospice liaison  Thank you for allowing the Palliative Medicine Team to assist in the care of this patient.  Lin Landsman, NP  Please contact Palliative Medicine Team phone at [phone] for questions and concerns.   *Portions of this note are a verbal dictation therefore any spelling and/or grammatical errors are due to the "[place] [employer]" system interpretation.

## 2022-09-15 NOTE — Progress Notes (Signed)
[facility]) Hospice hospital liaison note  Received request from PMT/TOC for family interest in [facility].   Chart reviewed and [facility] eligibility is confirmed at this time. Talked with SO Michelle by phone and explained services and answered questions as appropriate.   Unfortunately [facility] does not have a bed to offer today. TOC is aware hospital liaison will follow up tomorrow or sooner if room becomes available.   Please do not hesitate to call with any hospice related questions or concerns.   Thank you for the opportunity to participate in this patient's care.  Jhonnie Garner, Therapist, sports, [facility] Liaison  [phone]

## 2022-09-15 NOTE — IPAL (Signed)
Discussed with significant other, Sherrill Raring. She states that she has discussed with patient's sister and family and they have decided to pursue comfort-focused care moving forward. We had a lengthy discussion with what this entails. She wants to make sure that his pain is treated efficiently and that he is not agitated or breathing hard near the end of life. We also agreed to have our palliative care colleagues come by to discuss next steps, especially in terms of disposition. Family would not want him to stay home as they do not think they can provide adequate care for him, which I do think is very reasonable. Will likely need inpatient hospice, possibly [place] if able.  -transition to comfort-focused care -discontinue all aggressive interventions -morphine q2 prn for pain  -palliative care colleagues to visit today

## 2022-09-15 NOTE — Progress Notes (Signed)
Subjective:   Overnight Events:  Remains in Afib with rates 100s-110s. HDS. On 2L [place].  He was more somnolent this morning during my assessment, able to open eyes somewhat to verbal stimuli. Significant other, Andrew Brandt, arrived and discussed updates with her. Andrew Brandt states that she spoke with patient's family and they agreed that he should be transitioned to comfort-focused care. She wants to ensure that he is no longer having any pain and that he maintains dignity near his end-of-life. She does note that it would be very difficult to care for Andrew Brandt at home and they would like to look into an inpatient facility for his EOL care, which is very reasonable.    Objective:  Vital signs in last 24 hours: Vitals:   09/13/22 2026 09/16/2022 0050 09/10/2022 0333 09/03/2022 0400  BP: (!) 162/123 (!) 147/71 (!) 141/78 138/75  Pulse: (!) 105 (!) 107 (!) 105 89  Resp: '15 20 20 '$ (!) 21  Temp: 99.2 F (37.3 C) 98.3 F (36.8 C) 97.9 F (36.6 C)   TempSrc: Axillary Axillary Axillary   SpO2: 98% 100% 99% 99%  Weight:      Height:       Supplemental O2: Room Air SpO2: 99 % O2 Flow Rate (L/min): 2 L/min   Physical Exam:  General: chronically ill-appearing elderly male, somnolent, NAD. CV: tachycardic rate and irregularly irregular rhythm, no m/r/g. Pulm: mild wheezing bilaterally, normal WOB on 2L [place]. Abdomen: soft, ND, NTTP. Extremities: 2+ pitting edema in bilateral LE (R>L). RUE fistula intact. Skin: warm but pale. Neuro: somnolent, opens eyes somewhat to verbal stimuli. Deferred full neurological exam given somnolence.   Filed Weights   08/24/2022 0847 09/11/22 2040  Weight: 104.3 kg 103.1 kg     Intake/Output Summary (Last 24 hours) at 08/26/2022 0818 Last data filed at 09/03/2022 0334 Gross per 24 hour  Intake 1120 ml  Output 575 ml  Net 545 ml    Net IO Since Admission: 6,375.2 mL [09/19/2022 0818]  Pertinent Labs:    Latest Ref Rng & Units 09/11/2022     1:48 AM 09/13/2022    2:09 AM 09/12/2022    8:10 AM  CBC  WBC 4.0 - 10.5 K/uL 7.8  9.1  7.7   Hemoglobin 13.0 - 17.0 g/dL 8.8  9.3  10.7   Hematocrit 39.0 - 52.0 % 29.5  30.6  34.7   Platelets 150 - 400 K/uL 104  PLATELET CLUMPS NOTED ON SMEAR, UNABLE TO ESTIMATE  166        Latest Ref Rng & Units 09/08/2022    1:48 AM 09/13/2022    8:53 AM 09/12/2022    8:10 AM  CMP  Glucose 70 - 99 mg/dL 118  87  102   BUN 8 - 23 mg/dL '29  25  25   '$ Creatinine 0.61 - 1.24 mg/dL 1.83  1.65  1.57   Sodium 135 - 145 mmol/L 139  139  138   Potassium 3.5 - 5.1 mmol/L 3.6  3.6  4.2   Chloride 98 - 111 mmol/L 107  107  103   CO2 22 - 32 mmol/L '22  23  24   '$ Calcium 8.9 - 10.3 mg/dL 8.6  8.8  9.4   Blood cultures preliminarily negative 2 days LDL 114  Imaging: IR IVC FILTER PLMT / S&I Burke Keels GUID/MOD SED  Result Date: 09/13/2022 CLINICAL DATA:  Acute right  lower extremity DVT and need for IVC filter as the patient has developed rectal bleeding on anticoagulation. EXAM: 1. ULTRASOUND GUIDANCE FOR VASCULAR ACCESS OF THE RIGHT INTERNAL JUGULAR VEIN. 2. IVC VENOGRAM. 3. PERCUTANEOUS IVC FILTER PLACEMENT. ANESTHESIA/SEDATION: The patient did not receive formal IV conscious sedation. CONTRAST:  CO2 gas FLUOROSCOPY TIME:  92.4 mGy PROCEDURE: The procedure, risks, benefits, and alternatives were explained to the patient's significant other. Questions regarding the procedure were encouraged and answered. The patient's significant other understands and consents to the procedure. A time-out was performed prior to initiating the procedure. The right neck was prepped with chlorhexidine in a sterile fashion, and a sterile drape was applied covering the operative field. A sterile gown and sterile gloves were used for the procedure. Local anesthesia was provided with 1% Lidocaine. Ultrasound was utilized to confirm patency of the right internal jugular vein. Under direct ultrasound guidance, a 21 gauge needle was advanced into the  right internal jugular vein with ultrasound image documentation performed. After securing access with a micropuncture dilator, a guidewire was advanced into the inferior vena cava. A deployment sheath was advanced over the guidewire. This was utilized to perform IVC venography. The deployment sheath was further positioned in an appropriate location for filter deployment. A Bard Denali IVC filter was then advanced in the sheath. This was then fully deployed in the infrarenal IVC. Final filter position was confirmed with a fluoroscopic spot image. After the procedure the sheath was removed and hemostasis obtained with manual compression. COMPLICATIONS: None. FINDINGS: IVC venography demonstrates a normal caliber IVC with no evidence of thrombus. CO2 gas was utilized due to underlying chronic kidney disease. Renal veins are identified bilaterally. The IVC filter was successfully positioned below the level of the renal veins and is appropriately oriented. This IVC filter has both permanent and retrievable indications. IMPRESSION: Placement of percutaneous IVC filter in infrarenal IVC. IVC venogram shows no evidence of IVC thrombus and normal caliber of the inferior vena cava. This filter does have both permanent and retrievable indications. PLAN: This IVC filter is potentially retrievable. The patient will be assessed for filter retrieval by Interventional Radiology in approximately 8-12 weeks. Further recommendations regarding filter retrieval, continued surveillance or declaration of device permanence, will be made at that time. Electronically Signed   By: Aletta Edouard M.D.   On: 09/13/2022 16:36      Assessment/Plan:   Principal Problem:   COVID-19 virus infection   Patient Summary: Andrew Brandt is a 72 year old with a history of ESRD s/p renal transplant on immunosuppression, prostate cancer s/p prostatectomy, T2DM and hypertension who presents via EMS for generalized weakness, diarrhea and poor  appetite and found to have COVID-19 infection.    COMFORT CARE After repeat discussions with family, they have elected to pursue comfort-focused care for Andrew Brandt, which I believe is appropriate given his poor prognosis. I have placed comfort-focused care orders in conjunction with our palliative care colleagues. Family would like to pursue inpatient hospice. [facility] does not have any bed availabilities for today, but will reach out once they have an availability. 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 Will avoid any aggressive interventions and avoid any medications not centered around comfort-focused  care moving forward.  Diet: regular, careful hand feed VTE: held in setting of gi bleed. Code: DNR. Comfort care Family Update: lengthy conversation with significant other at bedside   Dispo: Likely to [facility] once they have a bed availability  Virl Axe, MD PGY-3 Internal Medicine Resident Pager: [phone]  Please contact the on call pager after 5 pm and on weekends at [phone].

## 2022-09-16 ENCOUNTER — Encounter (HOSPITAL_COMMUNITY): Payer: Self-pay | Admitting: Internal Medicine

## 2022-09-16 DIAGNOSIS — Z515 Encounter for palliative care: Secondary | ICD-10-CM | POA: Diagnosis not present

## 2022-09-16 DIAGNOSIS — E860 Dehydration: Secondary | ICD-10-CM | POA: Diagnosis not present

## 2022-09-16 DIAGNOSIS — U071 COVID-19: Secondary | ICD-10-CM | POA: Diagnosis not present

## 2022-09-16 DIAGNOSIS — Z7189 Other specified counseling: Secondary | ICD-10-CM

## 2022-09-16 DIAGNOSIS — G9340 Encephalopathy, unspecified: Secondary | ICD-10-CM | POA: Diagnosis not present

## 2022-09-16 DIAGNOSIS — D696 Thrombocytopenia, unspecified: Secondary | ICD-10-CM | POA: Diagnosis not present

## 2022-09-16 DIAGNOSIS — R531 Weakness: Secondary | ICD-10-CM | POA: Diagnosis not present

## 2022-09-16 LAB — CULTURE, BLOOD (ROUTINE X 2)
Culture: NO GROWTH
Culture: NO GROWTH
Special Requests: ADEQUATE

## 2022-09-16 MED ORDER — HYDROMORPHONE HCL 1 MG/ML IJ SOLN
1.0000 mg | Freq: Four times a day (QID) | INTRAMUSCULAR | Status: DC
  Administered 2022-09-16 – 2022-09-17 (×3): 1 mg via INTRAVENOUS
  Filled 2022-09-16 (×3): qty 1

## 2022-09-16 MED ORDER — HYDROMORPHONE BOLUS VIA INFUSION: 1.0000 mg | Freq: Four times a day (QID) | INTRAVENOUS | Status: DC

## 2022-09-16 NOTE — Progress Notes (Signed)
Manufacturing engineer [facility]) Hospital Liaison Note   Unfortunately, [facility] is not able to offer a room today. Family and Carol/TOC aware hospital liaison will follow up tomorrow or sooner if a room becomes available.    Please do not hesitate to call with any hospice related questions.    Thank you for the opportunity to participate in this patient's care.   Phillis Haggis, MSW [facility] Liaison [phone]

## 2022-09-16 NOTE — Progress Notes (Addendum)
Subjective:   Overnight Events:   No acute overnight events.  Mr. Andrew Brandt is resting comfortably during encounter. No obvious agitation. Per [employer], still no bed availability today.    Objective:  Vital signs in last 24 hours: Vitals:   09/13/2022 2000 09/15/22 0000 09/15/22 0419 09/15/22 0802  BP: (!) 152/65 (!) 122/56 130/67 108/83  Pulse: (!) 108 (!) 117 (!) 112 (!) 116  Resp: '20 19 20 20  '$ Temp: 97.9 F (36.6 C) 97.9 F (36.6 C) 98.9 F (37.2 C) 98.1 F (36.7 C)  TempSrc: Oral Oral Axillary Axillary  SpO2: 100% 100% 99% 100%  Weight:      Height:       Supplemental O2: Room Air SpO2: 100 % O2 Flow Rate (L/min): 2 L/min   Physical Exam:   General: terminally ill-appearing elderly male, resting comfortably in bed, NAD. Pulm: normal WOB on 2L [place], no visible respiratory distress. Skin: warm and dry. Neuro: somnolent, comfortably sedated.    Filed Weights   08/27/2022 0847 09/11/22 2040  Weight: 104.3 kg 103.1 kg     Intake/Output Summary (Last 24 hours) at 09/16/2022 1118 Last data filed at 09/15/2022 2000 Gross per 24 hour  Intake 0 ml  Output --  Net 0 ml   Net IO Since Admission: 5,635.2 mL [09/16/22 1118]  Pertinent Labs:    Latest Ref Rng & Units 09/15/2022    1:26 AM 08/25/2022    9:44 AM 09/09/2022    1:48 AM  CBC  WBC 4.0 - 10.5 K/uL 5.3   7.8    8.0   Hemoglobin 13.0 - 17.0 g/dL 8.4   8.8    9.0   Hematocrit 39.0 - 52.0 % 28.3   29.5    28.8   Platelets 150 - 400 K/uL 126  118  104    99        Latest Ref Rng & Units 09/15/2022    1:26 AM 09/11/2022    9:44 AM 09/18/2022    1:48 AM  CMP  Glucose 70 - 99 mg/dL 156   118   BUN 8 - 23 mg/dL 34   29   Creatinine 0.61 - 1.24 mg/dL 2.22   1.83   Sodium 135 - 145 mmol/L 137   139   Potassium 3.5 - 5.1 mmol/L 3.4   3.6   Chloride 98 - 111 mmol/L 106   107   CO2 22 - 32 mmol/L 21   22   Calcium 8.9 - 10.3 mg/dL 8.7   8.6   Total Protein 6.5 - 8.1 g/dL 5.0  5.7     Total Bilirubin 0.3 - 1.2 mg/dL 1.0  1.1    Alkaline Phos 38 - 126 U/L 35  41    AST 15 - 41 U/L 14  20    ALT 0 - 44 U/L 9  9    Blood cultures preliminarily negative 2 days LDL 114  Imaging: No results found.    Assessment/Plan:   Principal Problem:   COVID-19 virus infection   Patient Summary: Mr. 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    COMFORT CARE After repeat discussions with family, they have elected to pursue comfort-focused  care for Mr. Squillace, which I believe is appropriate given his poor prognosis. I have placed comfort-focused care orders in conjunction with our palliative care colleagues. Family would like to pursue inpatient hospice. [facility] does not have a bed availability for today, will try again tomorrow. Updated significant other, Sharyn Lull. 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 Will avoid any aggressive interventions and avoid any medications not centered around comfort-focused care moving forward.  Diet: regular, careful hand feed VTE: held in setting of gi bleed. Code: DNR. Comfort care Family Update: lengthy conversation with significant other at bedside   Dispo: Likely to [facility] once they have a bed availability  Virl Axe, MD PGY-3 Internal Medicine Resident Pager: [phone]  Please contact the on call  pager after 5 pm and on weekends at [phone].

## 2022-09-16 NOTE — Progress Notes (Signed)
Daily Progress Note   Patient Name: Andrew Brandt       Date: 09/16/2022 DOB: [birth year]  Age: 72 y.o. MRN#: [id number] Attending Physician: Aldine Contes, MD Primary Care Physician: Benito Mccreedy, MD Admit Date: 09/19/2022  Reason for Consultation/Follow-up: Non pain symptom management, Pain control, Psychosocial/spiritual support, and Terminal Care  Subjective: I have reviewed medical records including EPIC notes and labs.  Reviewed medication administration records. Received report from primary RN -no acute concerns.  Patient is not eating/drinking.  Per RN, patient's oxygen saturation highly fluctuates between the 60s to 90s.  Reviewed there would be no escalation to oxygen based on saturations.  Received notification from [facility] that [facility] does not have a bed offer today.  Went to visit patient at bedside -significant other/Michelle, son, and daughter-in-law present.  Patient was lying in bed with eyes open but not verbally responsive -he does not seem to indicate he is aware of my presence. No respiratory distress or secretions; mild increased respiratory effort noted. He is on 2 L O2 nasal cannula.  Respiratory rate 24.  Emotional support provided to family.  Therapeutic listening provided as they reflect over patient's transition to comfort yesterday as well as hopeful this to be transferred to [facility].  Reviewed that, per hospice liaison, they still have no beds available today.  Natural trajectory at end-of-life reviewed. Sharyn Lull tells me that she is "just worried" that patient is in pain.  Education provided on signs and symptoms of discomfort at end-of-life.  Symptom management plan reviewed in detail.  Discussed initiating scheduled Dilaudid every 6 hours in addition  to continuing as needed doses for breakthrough symptoms - family is agreeable and appreciative.  Sharyn Lull states "this will help me sleep better tonight."  All questions and concerns addressed. Encouraged to call with questions and/or concerns. PMT card previously provided.  Length of Stay: 11  Current Medications: Scheduled Meds:   antiseptic oral rinse  15 mL Topical BID    Continuous Infusions:   PRN Meds: acetaminophen **OR** acetaminophen, diphenhydrAMINE, glycopyrrolate **OR** glycopyrrolate **OR** glycopyrrolate, haloperidol **OR** haloperidol **OR** haloperidol lactate, HYDROmorphone (DILAUDID) injection, levalbuterol, LORazepam **OR** LORazepam, ondansetron **OR** ondansetron (ZOFRAN) IV, polyvinyl alcohol, senna  Physical Exam Vitals and nursing note reviewed.  Constitutional:      General: He is not in acute distress.  Appearance: He is ill-appearing.  Pulmonary:     Effort: No respiratory distress.  Skin:    General: Skin is warm and dry.  Neurological:     Mental Status: He is unresponsive.     Motor: Weakness present.  Psychiatric:        Speech: He is noncommunicative.             Vital Signs: BP 108/83 (BP Location: Left Arm)   Pulse (!) 116   Temp 98.1 F (36.7 C) (Axillary)   Resp 20   Ht '5\' 9"'$  (1.753 m)   Wt 103.1 kg   SpO2 100%   BMI 33.57 kg/m  SpO2: SpO2: 100 % O2 Device: O2 Device: Nasal Cannula O2 Flow Rate: O2 Flow Rate (L/min): 2 L/min  Intake/output summary:  Intake/Output Summary (Last 24 hours) at 09/16/2022 1047 Last data filed at 09/15/2022 2000 Gross per 24 hour  Intake 0 ml  Output --  Net 0 ml   LBM: Last BM Date : 09/15/22 Baseline Weight: Weight: 104.3 kg Most recent weight: Weight: 103.1 kg       Palliative Assessment/Data: PPS 10%      Patient Active Problem List   Diagnosis Date Noted   COVID-19 virus infection 09/10/2022   End stage renal disease ([place]) 01/05/2014   Thrombocytopenia, unspecified ([place])  11/25/2013   ESRD (end stage renal disease) ([place]) 11/20/2013   Normocytic anemia 11/20/2013   AKI (acute kidney injury) ([place]) 11/19/2013   HTN (hypertension) 11/19/2013    Palliative Care Assessment & Plan   Patient Profile: 72 y.o. male  with past medical history of ESRD s/p renal transplant (immunosuppression), prostate cancer s/p prostatectomy, type 2 diabetes, and HTN admitted on 08/24/2022 with generalized weakness, diarrhea, and poor appetite. Pt found to be Covid 19 positive.   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    Assessment: Principal Problem:   COVID-19 virus infection   Terminal care  Recommendations/Plan: Continue full comfort measures Continue DNR/DNI as previously documented Transfer to [facility] when bed available Comfort focused medication regimen adjusted as noted below PMT will continue to follow and support holistically  Symptom Management Initiated scheduled dilaudid q6h; continue PRN doses for breakthrough symptoms Tylenol PRN pain/fever Biotin twice daily Benadryl PRN itching Robinul PRN secretions Haldol PRN agitation/delirium Ativan PRN anxiety/seizure/sleep/distress Zofran PRN nausea/vomiting Liquifilm Tears PRN dry eye Levalbuterol PRN wheezing  Goals of Care and Additional Recommendations: Limitations on Scope of Treatment: Full Comfort Care  Code Status:    Code Status Orders  (From admission, onward)           Start     Ordered   09/15/22 1117  Do not attempt resuscitation (DNR)  Continuous       Question Answer Comment  If patient has no pulse and is not breathing Do Not Attempt Resuscitation   If patient has a pulse and/or is breathing: Medical Treatment Goals COMFORT MEASURES: Keep clean/warm/dry, use medication by any route; positioning, wound care and other measures to  relieve pain/suffering; use oxygen, suction/manual treatment of airway obstruction for comfort; do not transfer unless for comfort needs.   Consent: Discussion documented in EHR or advanced directives reviewed      09/15/22 1126           Code Status History     Date Active Date Inactive  Code Status Order ID Comments User Context   09/15/2022 1032 09/15/2022 1126 DNR [id number]  Virl Axe, MD Inpatient   09/13/2022 1601 09/15/2022 1032 DNR [id number]  Jordan Hawks, [place] Inpatient   08/23/2022 1432 09/13/2022 1601 Full Code [id number]  Lacinda Axon, MD ED   11/19/2013 1848 11/26/2013 2155 Full Code [id number]  Cresenciano Genre Inpatient       Prognosis:  < 2 weeks  Discharge Planning: Hospice facility  Care plan was discussed with primary RN, patient's family, TOC, Dr. Allyson Sabal  Thank you for allowing the Palliative Medicine Team to assist in the care of this patient.   Lin Landsman, NP  Please contact Palliative Medicine Team phone at [phone] for questions and concerns.   *Portions of this note are a verbal dictation therefore any spelling and/or grammatical errors are due to the "[employer]" system interpretation.

## 2022-09-17 ENCOUNTER — Other Ambulatory Visit (HOSPITAL_COMMUNITY): Payer: Self-pay

## 2022-09-19 ENCOUNTER — Ambulatory Visit (HOSPITAL_COMMUNITY): Payer: Medicare Other | Admitting: Nurse Practitioner

## 2022-09-21 NOTE — Progress Notes (Signed)
Patient expired at 0239, witnessed by Izola Price, RN and Evangeline Gula , RN.  Dr. Markus Jarvis Notified. Baldwin donors, significant other Sherrill Raring and Daralene Milch, sister and next of king of the deceased patient Notified.

## 2022-09-21 NOTE — Death Summary Note (Signed)
  Name: Andrew Brandt MRN: [id number] DOB: Sep 15, 1950 72 y.o.  Date of Admission: 09/01/2022  8:43 AM Date of Discharge: 10/11/22 Attending Physician: Aldine Contes, MD  Discharge Diagnosis: Principal Problem:   COVID-19 virus infection Multifactorial encephalopathy Colonic neoplasm and painless hematochezia Diverticulosis Acute blood loss anemia Pneumonia COVID-19 Afib with RVR ESRD s/p renal transplant AKI on CKD4 Hypertension Type 2 diabetes   Cause of death: PEA arrest  Time of death: 0239 on 10-11-22  Disposition and follow-up:   Andrew Brandt was discharged from [facility] in expired condition.    Hospital Course: Andrew Brandt is a 72 yo male with history of ESRD s/p renal transplant, prostate cancer s/p prostatectomy, T2DM, and HTN who was initially admitted on 2/13 with generalized weakness, diarrhea, and poor appetite in the setting of positive of COVID-19. 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 As these complications and new findings arose, the patient had a worsening of his encephalopathy and weakness, and became minimally verbal/unable to participate in conversation. After repeated discussions with the patient's family, they elected to pursue comfort-focused care for Andrew Brandt, which our team believed was appropriate given his poor prognosis in the setting of his multiple medical issues. The patient was kept comfortable with pain medications and ultimately passed away on 10/11/22 at 0239. Family (significant other, Sharyn Lull) was called and our condolences were offered to her.   Signed: Dorethea Clan, DO 10-11-2022, 2:55 AM

## 2022-09-21 NOTE — Progress Notes (Signed)
Pt expired at 0239 verified by myself and Shreejana Gurung no respirations, no pulse felt or heart beat auscultated.

## 2022-09-21 DEATH — deceased

## 2022-10-04 ENCOUNTER — Ambulatory Visit: Payer: Medicare Other | Admitting: Skilled Nursing Facility1
